# Patient Record
Sex: Female | Born: 1945 | Race: Black or African American | Hispanic: No | Smoking: Never smoker
Health system: Southern US, Community
[De-identification: ages and names within clinical notes are randomized; demographics above are authoritative.]

## PROBLEM LIST (undated history)

## (undated) DIAGNOSIS — I639 Cerebral infarction, unspecified: Secondary | ICD-10-CM

## (undated) DIAGNOSIS — J302 Other seasonal allergic rhinitis: Secondary | ICD-10-CM

## (undated) DIAGNOSIS — M109 Gout, unspecified: Secondary | ICD-10-CM

## (undated) HISTORY — PX: GANGLION CYST EXCISION: SHX1691

## (undated) HISTORY — PX: BACK SURGERY: SHX140

## (undated) HISTORY — PX: ABDOMINAL HYSTERECTOMY: SHX81

---

## 2016-02-11 ENCOUNTER — Emergency Department (HOSPITAL_BASED_OUTPATIENT_CLINIC_OR_DEPARTMENT_OTHER)
Admission: EM | Admit: 2016-02-11 | Discharge: 2016-02-11 | Disposition: A | Discharge status: Home / Self Care | Payer: Medicare HMO | Attending: Emergency Medicine | Admitting: Emergency Medicine

## 2016-02-11 ENCOUNTER — Encounter (HOSPITAL_BASED_OUTPATIENT_CLINIC_OR_DEPARTMENT_OTHER): Payer: Self-pay | Admitting: *Deleted

## 2016-02-11 DIAGNOSIS — R220 Localized swelling, mass and lump, head: Secondary | ICD-10-CM | POA: Diagnosis present

## 2016-02-11 DIAGNOSIS — H109 Unspecified conjunctivitis: Secondary | ICD-10-CM | POA: Diagnosis not present

## 2016-02-11 HISTORY — DX: Other seasonal allergic rhinitis: J30.2

## 2016-02-11 MED ORDER — TOBRAMYCIN 0.3 % OP SOLN: 2.0000 [drp] | OPHTHALMIC | Status: DC

## 2016-02-11 MED ORDER — TETRACAINE HCL 0.5 % OP SOLN
2.0000 [drp] | Freq: Once | OPHTHALMIC | Status: AC
Start: 2016-02-11 — End: 2016-02-11
  Administered 2016-02-11: 2 [drp] via OPHTHALMIC
  Filled 2016-02-11: qty 4

## 2016-02-11 MED ORDER — DEXAMETHASONE SODIUM PHOSPHATE 10 MG/ML IJ SOLN
10.0000 mg | Freq: Once | INTRAMUSCULAR | Status: AC
  Administered 2016-02-11: 10 mg via INTRAMUSCULAR
  Filled 2016-02-11: qty 1

## 2016-02-11 MED ORDER — FLUORESCEIN SODIUM 1 MG OP STRP
1.0000 | ORAL_STRIP | Freq: Once | OPHTHALMIC | Status: AC
  Administered 2016-02-11: 1 via OPHTHALMIC
  Filled 2016-02-11: qty 1

## 2016-02-11 NOTE — ED Notes (Signed)
Pt reports her usual allergy symptoms with facial tenderness and sinus pain and bilateral eye swelling. Pt states "I don't feel sick, I just need a shot to chase this swelling out of my eyes..."" pt has taken claritin and murine eye drops with no relief.

## 2016-02-11 NOTE — ED Provider Notes (Signed)
CSN: [id number]     Arrival date & time 02/11/16  0946 History   First MD Initiated Contact with Patient 02/11/16 1002     Chief Complaint  Patient presents with  . Facial Swelling     (Consider location/radiation/quality/duration/timing/severity/associated sxs/prior Treatment) HPI Comments: Patient presents with redness and swelling to her left eye. She states that this is been going on for the last few days and has been worsening since then. She has a history of seasonal allergies and states that in the past she's had similar symptoms of eye redness and swelling attributed to her allergies. She reports itchy, watery eyes with some occasional yellow discharge. She denies any pain to the eye. No vision changes. She's been using Claritin and her Murine eyedrops without relief. She states normally it improves with a steroid shot.   Past Medical History  Diagnosis Date  . Seasonal allergies    History reviewed. No pertinent past surgical history. History reviewed. No pertinent family history. Social History  Substance Use Topics  . Smoking status: Never Smoker   . Smokeless tobacco: None  . Alcohol Use: None   OB History    No data available     Review of Systems  Constitutional: Negative for fever, chills, diaphoresis and fatigue.  HENT: Positive for facial swelling. Negative for congestion, rhinorrhea and sneezing.   Eyes: Positive for discharge, redness and itching. Negative for photophobia, pain and visual disturbance.  Respiratory: Negative for cough, chest tightness and shortness of breath.   Cardiovascular: Negative for chest pain and leg swelling.  Gastrointestinal: Negative for nausea, vomiting, abdominal pain, diarrhea and blood in stool.  Genitourinary: Negative for frequency, hematuria, flank pain and difficulty urinating.  Musculoskeletal: Negative for back pain and arthralgias.  Skin: Negative for rash.  Neurological: Negative for dizziness, speech difficulty,  weakness, numbness and headaches.      Allergies  Review of patient's allergies indicates no known allergies.  Home Medications   Prior to Admission medications   Medication Sig Start Date End Date Taking? Authorizing Provider  tobramycin (TOBREX) 0.3 % ophthalmic solution Place 2 drops into the left eye every 4 (four) hours. 02/11/16   Rolan BuccoMelanie Alainna Stawicki, MD   BP 153/92 mmHg  Pulse 69  Temp(Src) 98.5 F (36.9 C) (Oral)  Resp 18  Ht 5\' 3"  (1.6 m)  Wt 207 lb (93.895 kg)  BMI 36.68 kg/m2  SpO2 98% Physical Exam  Constitutional: She is oriented to person, place, and time. She appears well-developed and well-nourished.  HENT:  Head: Normocephalic and atraumatic.  Eyes: Pupils are equal, round, and reactive to light.  Patient has mild swelling around the right eye. There is no redness. No pain with eye movements. No proptosis. She has erythema to the conjunctiva of the left eye with watery drainage.  Neck: Normal range of motion. Neck supple.  Cardiovascular: Normal rate, regular rhythm and normal heart sounds.   Pulmonary/Chest: Effort normal and breath sounds normal. No respiratory distress. She has no wheezes. She has no rales. She exhibits no tenderness.  Abdominal: Soft. Bowel sounds are normal. There is no tenderness. There is no rebound and no guarding.  Musculoskeletal: Normal range of motion. She exhibits no edema.  Lymphadenopathy:    She has no cervical adenopathy.  Neurological: She is alert and oriented to person, place, and time.  Skin: Skin is warm and dry. No rash noted.  Psychiatric: She has a normal mood and affect.    ED Course  Procedures (including critical  care time) Labs Review Labs Reviewed - No data to display  Imaging Review No results found. I have personally reviewed and evaluated these images and lab results as part of my medical decision-making.   EKG Interpretation None      MDM   Final diagnoses:  Conjunctivitis of left eye    Patient  with redness and drainage from the left eye. There is no suggestions of orbital or. Orbital cellulitis. Slit-lamp exam was performed. There is no staining areas. No dendrites. She was discharged home in good condition. She stresses that these are the same symptoms she's had before with her allergies and usually responds to steroids. She was given a shot of Decadron in the ED. I did go ahead and put her on Tobrex eyedrops as well. I encouraged her to follow-up with her eye doctor on Monday if her symptoms are not improved. She does have an appointment with her eye doctor on the 27th of this month, but I encouraged her to be seen on Monday if her symptoms are not improved. She was advised return here if she has worsening symptoms.    Rolan Bucco, MD 02/11/16 1106

## 2016-02-11 NOTE — ED Notes (Signed)
Supplies placed at bedside for md. 

## 2017-04-22 ENCOUNTER — Emergency Department (HOSPITAL_BASED_OUTPATIENT_CLINIC_OR_DEPARTMENT_OTHER)
Admission: EM | Admit: 2017-04-22 | Discharge: 2017-04-22 | Disposition: A | Discharge status: Home / Self Care | Payer: Medicare HMO | Attending: Emergency Medicine | Admitting: Emergency Medicine

## 2017-04-22 ENCOUNTER — Emergency Department (HOSPITAL_BASED_OUTPATIENT_CLINIC_OR_DEPARTMENT_OTHER): Payer: Medicare HMO

## 2017-04-22 ENCOUNTER — Encounter (HOSPITAL_BASED_OUTPATIENT_CLINIC_OR_DEPARTMENT_OTHER): Payer: Self-pay | Admitting: *Deleted

## 2017-04-22 DIAGNOSIS — Y929 Unspecified place or not applicable: Secondary | ICD-10-CM | POA: Diagnosis not present

## 2017-04-22 DIAGNOSIS — M79671 Pain in right foot: Secondary | ICD-10-CM | POA: Diagnosis present

## 2017-04-22 DIAGNOSIS — Y999 Unspecified external cause status: Secondary | ICD-10-CM | POA: Insufficient documentation

## 2017-04-22 DIAGNOSIS — Y9301 Activity, walking, marching and hiking: Secondary | ICD-10-CM | POA: Diagnosis not present

## 2017-04-22 DIAGNOSIS — S93491A Sprain of other ligament of right ankle, initial encounter: Secondary | ICD-10-CM

## 2017-04-22 DIAGNOSIS — X58XXXA Exposure to other specified factors, initial encounter: Secondary | ICD-10-CM | POA: Diagnosis not present

## 2017-04-22 HISTORY — DX: Gout, unspecified: M10.9

## 2017-04-22 MED ORDER — ACETAMINOPHEN 500 MG PO TABS
1000.0000 mg | ORAL_TABLET | Freq: Once | ORAL | Status: AC
  Administered 2017-04-22: 1000 mg via ORAL
  Filled 2017-04-22: qty 2

## 2017-04-22 NOTE — Discharge Instructions (Signed)
Take tylenol 1000mg(2 extra strength) four times a day.  ° °

## 2017-04-22 NOTE — ED Triage Notes (Signed)
Right foot pain and swelling x 2 days. No injury.

## 2017-04-22 NOTE — ED Provider Notes (Signed)
[facility]   CSN: [id number] Arrival date & time: 04/22/17  1628     History   Chief Complaint Chief Complaint  Patient presents with  . Foot Pain    HPI Carrie Kline is a 71 y.o. female.  71 yo F with a chief complaint of right ankle pain. Going on for the past 3 or 4 days. She has been walking on it more often. Denies trauma denies rolling it.  She denies fevers or chills. Denies knee pain. She has a history of gout but thinks this feels different. Describes it as a sharp and shooting pain. Has tried ice and elevation with some improvement.   The history is provided by the patient.  Foot Pain  This is a new problem. The current episode started more than 2 days ago. The problem occurs constantly. The problem has been gradually worsening. Pertinent negatives include no chest pain, no headaches and no shortness of breath. The symptoms are aggravated by walking, twisting and bending. Nothing relieves the symptoms. She has tried nothing for the symptoms. The treatment provided no relief.    Past Medical History:  Diagnosis Date  . Gout   . Seasonal allergies     There are no active problems to display for this patient.   Past Surgical History:  Procedure Laterality Date  . ABDOMINAL HYSTERECTOMY    . BACK SURGERY    . GANGLION CYST EXCISION      OB History    No data available       Home Medications    Prior to Admission medications   Medication Sig Start Date End Date Taking? Authorizing Provider  tobramycin (TOBREX) 0.3 % ophthalmic solution Place 2 drops into the left eye every 4 (four) hours. 02/11/16   Rolan Bucco, MD    Family History No family history on file.  Social History Social History  Substance Use Topics  . Smoking status: Never Smoker  . Smokeless tobacco: Never Used  . Alcohol use No     Allergies   Darvon [propoxyphene] and Hydrocortisone   Review of Systems Review of Systems  Constitutional:  Negative for chills and fever.  HENT: Negative for congestion and rhinorrhea.   Eyes: Negative for redness and visual disturbance.  Respiratory: Negative for shortness of breath and wheezing.   Cardiovascular: Negative for chest pain and palpitations.  Gastrointestinal: Negative for nausea and vomiting.  Genitourinary: Negative for dysuria and urgency.  Musculoskeletal: Positive for arthralgias and myalgias.  Skin: Negative for pallor and wound.  Neurological: Negative for dizziness and headaches.     Physical Exam Updated Vital Signs BP 129/77   Pulse 89   Temp 99 F (37.2 C) (Oral)   Resp 18   Ht 5\' 3"  (1.6 m)   Wt 89.4 kg (197 lb)   SpO2 100%   BMI 34.90 kg/m   Physical Exam  Constitutional: She is oriented to person, place, and time. She appears well-developed and well-nourished. No distress.  HENT:  Head: Normocephalic and atraumatic.  Eyes: Pupils are equal, round, and reactive to light. EOM are normal.  Neck: Normal range of motion. Neck supple.  Cardiovascular: Normal rate and regular rhythm.  Exam reveals no gallop and no friction rub.   No murmur heard. Pulmonary/Chest: Effort normal. She has no wheezes. She has no rales.  Abdominal: Soft. She exhibits no distension and no mass. There is no tenderness. There is no guarding.  Musculoskeletal: She exhibits edema and tenderness.  Tenderness and swelling to the right ankle. No bony tenderness. Able to range  without difficulty. Mildly warm compared to the other side. No overlying erythema. Pulse motor and sensation intact distally.  Neurological: She is alert and oriented to person, place, and time.  Skin: Skin is warm and dry. She is not diaphoretic.  Psychiatric: She has a normal mood and affect. Her behavior is normal.  Nursing note and vitals reviewed.    ED Treatments / Results  Labs (all labs ordered are listed, but only abnormal results are displayed) Labs Reviewed - No data to display  EKG  EKG  Interpretation None       Radiology Dg Foot Complete Right  Result Date: 04/22/2017 CLINICAL DATA:  RIGHT foot pain for 2 days medially, history gout EXAM: RIGHT FOOT COMPLETE - 3+ VIEW COMPARISON:  None FINDINGS: Diffuse soft tissue swelling. Osseous demineralization. Joint spaces preserved. Small plantar calcaneal spur. No acute fracture, subluxation, or bone destruction. IMPRESSION: Soft tissue swelling RIGHT foot without acute bony abnormalities. Electronically Signed   By: Ulyses Southward M.D.   On: 04/22/2017 17:16    Procedures Procedures (including critical care time)  Medications Ordered in ED Medications  acetaminophen (TYLENOL) tablet 1,000 mg (1,000 mg Oral Given 04/22/17 1805)     Initial Impression / Assessment and Plan / ED Course  I have reviewed the triage vital signs and the nursing notes.  Pertinent labs & imaging results that were available during my care of the patient were reviewed by me and considered in my medical decision making (see chart for details).     71 yo F With a chief complaint of right ankle pain. This been going on for about 3 or 4 days. I doubt this is septic arthritis without time course as well as clinically she is able to move it mildly it's not hot and she does not have a fever. I did however discuss an arthrocentesis was evaluated several this out for sure. At this point she will defer the procedure. We'll place in a splint. Make her nonweightbearing. PCP follow-up.  6:06 PM:  I have discussed the diagnosis/risks/treatment options with the patient and believe the pt to be eligible for discharge home to follow-up with PCP. We also discussed returning to the ED immediately if new or worsening sx occur. We discussed the sx which are most concerning (e.g., sudden worsening pain, fever, inability to tolerate by mouth) that necessitate immediate return. Medications administered to the patient during their visit and any new prescriptions provided to the  patient are listed below.  Medications given during this visit Medications  acetaminophen (TYLENOL) tablet 1,000 mg (1,000 mg Oral Given 04/22/17 1805)     The patient appears reasonably screen and/or stabilized for discharge and I doubt any other medical condition or other [facility] [place] requiring further screening, evaluation, or treatment in the ED at this time prior to discharge.    Final Clinical Impressions(s) / ED Diagnoses   Final diagnoses:  Sprain of anterior talofibular ligament of right ankle, initial encounter    New Prescriptions New Prescriptions   No medications on file     Melene Plan, DO 04/22/17 1806

## 2017-05-20 ENCOUNTER — Emergency Department (HOSPITAL_BASED_OUTPATIENT_CLINIC_OR_DEPARTMENT_OTHER)
Admission: EM | Admit: 2017-05-20 | Discharge: 2017-05-20 | Disposition: A | Discharge status: Home / Self Care | Payer: Medicare HMO | Attending: Emergency Medicine | Admitting: Emergency Medicine

## 2017-05-20 ENCOUNTER — Encounter (HOSPITAL_BASED_OUTPATIENT_CLINIC_OR_DEPARTMENT_OTHER): Payer: Self-pay | Admitting: *Deleted

## 2017-05-20 DIAGNOSIS — J069 Acute upper respiratory infection, unspecified: Secondary | ICD-10-CM | POA: Diagnosis not present

## 2017-05-20 DIAGNOSIS — R05 Cough: Secondary | ICD-10-CM | POA: Diagnosis present

## 2017-05-20 MED ORDER — DOXYCYCLINE HYCLATE 100 MG PO CAPS: 100.0000 mg | ORAL_CAPSULE | Freq: Two times a day (BID) | ORAL | 0 refills | Status: DC

## 2017-05-20 MED ORDER — PROMETHAZINE-DM 6.25-15 MG/5ML PO SYRP: 5.0000 mL | ORAL_SOLUTION | Freq: Four times a day (QID) | ORAL | 0 refills | Status: DC | PRN

## 2017-05-20 MED ORDER — GUAIFENESIN ER 1200 MG PO TB12: 1.0000 | ORAL_TABLET | Freq: Two times a day (BID) | ORAL | 0 refills | Status: DC

## 2017-05-20 MED ORDER — DEXAMETHASONE SODIUM PHOSPHATE 10 MG/ML IJ SOLN
8.0000 mg | Freq: Once | INTRAMUSCULAR | Status: AC
  Administered 2017-05-20: 8 mg via INTRAMUSCULAR
  Filled 2017-05-20: qty 1

## 2017-05-20 NOTE — Discharge Instructions (Signed)
Return here as needed. Follow up with your primary doctor. Increase your fluid intake. °

## 2017-05-20 NOTE — ED Triage Notes (Signed)
Cough, congestion and headache x 5 days. She has been taking OTC sinus medication.

## 2017-05-24 NOTE — ED Provider Notes (Signed)
[facility]-EMERGENCY DEPT Provider Note   CSN: [id number] Arrival date & time: 05/20/17  4098     History   Chief Complaint Chief Complaint  Patient presents with  . Cough    HPI Carrie Kline is a 71 y.o. female.  HPI Patient presents to the emergency department with cough, nasal congestion and a headache 5 days.  The patient states that she gets this type of illness every year and it is always a sinus infection.  She would like treatment for sinus infection.  She states that nothing seems make her condition better or worse.  She states she did take some over-the-counter medications he takes medications chronically for allergies.  She states that nothing seems make the condition better or worseThe patient denies chest pain, shortness of breath, headache,blurred vision, neck pain, fever,  weakness, numbness, dizziness, anorexia, edema, abdominal pain, nausea, vomiting, diarrhea, rash, back pain, dysuria, hematemesis, bloody stool, near syncope, or syncope. Past Medical History:  Diagnosis Date  . Gout   . Seasonal allergies     There are no active problems to display for this patient.   Past Surgical History:  Procedure Laterality Date  . ABDOMINAL HYSTERECTOMY    . BACK SURGERY    . GANGLION CYST EXCISION      OB History    No data available       Home Medications    Prior to Admission medications   Medication Sig Start Date End Date Taking? Authorizing Provider  doxycycline (VIBRAMYCIN) 100 MG capsule Take 1 capsule (100 mg total) by mouth 2 (two) times daily. 05/20/17   Audryna Wendt, Cristal Deer, PA-C  Guaifenesin 1200 MG TB12 Take 1 tablet (1,200 mg total) by mouth 2 (two) times daily. 05/20/17   Aland Chestnutt, Cristal Deer, PA-C  promethazine-dextromethorphan (PROMETHAZINE-DM) 6.25-15 MG/5ML syrup Take 5 mLs by mouth 4 (four) times daily as needed for cough. 05/20/17   Oniel Meleski, Cristal Deer, PA-C  tobramycin (TOBREX) 0.3 % ophthalmic solution Place 2 drops into the left eye every 4  (four) hours. 02/11/16   Rolan Bucco, MD    Family History No family history on file.  Social History Social History  Substance Use Topics  . Smoking status: Never Smoker  . Smokeless tobacco: Never Used  . Alcohol use No     Allergies   Darvon [propoxyphene] and Hydrocortisone   Review of Systems Review of Systems  All other systems negative except as documented in the HPI. All pertinent positives and negatives as reviewed in the HPI. Physical Exam Updated Vital Signs BP (!) 160/89 (BP Location: Left Arm)   Pulse 82   Temp 98.2 F (36.8 C) (Oral)   Resp 16   Ht  (1.6 m)   Wt 89.4 kg (197 lb)   SpO2 100%   BMI 34.90 kg/m   Physical Exam  Constitutional: She is oriented to person, place, and time. She appears well-developed and well-nourished. No distress.  HENT:  Head: Normocephalic and atraumatic.  Mouth/Throat: Oropharynx is clear and moist.  Eyes: Pupils are equal, round, and reactive to light.  Neck: Normal range of motion. Neck supple.  Cardiovascular: Normal rate, regular rhythm and normal heart sounds.  Exam reveals no gallop and no friction rub.   No murmur heard. Pulmonary/Chest: Effort normal and breath sounds normal. No respiratory distress. She has no wheezes.  Neurological: She is alert and oriented to person, place, and time. She exhibits normal muscle tone. Coordination normal.  Skin: Skin is warm and dry. Capillary refill takes less  than 2 seconds. No rash noted. No erythema.  Psychiatric: She has a normal mood and affect. Her behavior is normal.  Nursing note and vitals reviewed.    ED Treatments / Results  Labs (all labs ordered are listed, but only abnormal results are displayed) Labs Reviewed - No data to display  EKG  EKG Interpretation None       Radiology No results found.  Procedures Procedures (including critical care time)  Medications Ordered in ED Medications  dexamethasone (DECADRON) injection 8 mg (8 mg  Intramuscular Given 05/20/17 2138)     Initial Impression / Assessment and Plan / ED Course  I have reviewed the triage vital signs and the nursing notes.  Pertinent labs & imaging results that were available during my care of the patient were reviewed by me and considered in my medical decision making (see chart for details).     She will be treated for a sinusitis.  Told to return here as needed.  Patient agrees to plan and all questions were answered  Final Clinical Impressions(s) / ED Diagnoses   Final diagnoses:  Acute upper respiratory infection    New Prescriptions Discharge Medication List as of 05/20/2017  9:23 PM    START taking these medications   Details  doxycycline (VIBRAMYCIN) 100 MG capsule Take 1 capsule (100 mg total) by mouth 2 (two) times daily., Starting Mon 05/20/2017, Print    Guaifenesin 1200 MG TB12 Take 1 tablet (1,200 mg total) by mouth 2 (two) times daily., Starting Mon 05/20/2017, Print    promethazine-dextromethorphan (PROMETHAZINE-DM) 6.25-15 MG/5ML syrup Take 5 mLs by mouth 4 (four) times daily as needed for cough., Starting Mon 05/20/2017, Print         Elia Nunley, [place], PA-C 05/24/17 0981    Vanetta Mulders, MD 06/01/17 [phone]

## 2017-10-28 ENCOUNTER — Emergency Department (HOSPITAL_BASED_OUTPATIENT_CLINIC_OR_DEPARTMENT_OTHER)
Admission: EM | Admit: 2017-10-28 | Discharge: 2017-10-28 | Disposition: A | Discharge status: Home / Self Care | Payer: Medicare HMO | Attending: Emergency Medicine | Admitting: Emergency Medicine

## 2017-10-28 ENCOUNTER — Other Ambulatory Visit: Payer: Self-pay

## 2017-10-28 ENCOUNTER — Encounter (HOSPITAL_BASED_OUTPATIENT_CLINIC_OR_DEPARTMENT_OTHER): Payer: Self-pay | Admitting: Emergency Medicine

## 2017-10-28 DIAGNOSIS — R070 Pain in throat: Secondary | ICD-10-CM | POA: Diagnosis present

## 2017-10-28 DIAGNOSIS — J020 Streptococcal pharyngitis: Secondary | ICD-10-CM | POA: Diagnosis not present

## 2017-10-28 DIAGNOSIS — Z79899 Other long term (current) drug therapy: Secondary | ICD-10-CM | POA: Insufficient documentation

## 2017-10-28 DIAGNOSIS — J069 Acute upper respiratory infection, unspecified: Secondary | ICD-10-CM

## 2017-10-28 LAB — RAPID STREP SCREEN (MED CTR MEBANE ONLY): Streptococcus, Group A Screen (Direct): POSITIVE — AB

## 2017-10-28 MED ORDER — ACETAMINOPHEN 325 MG PO TABS
650.0000 mg | ORAL_TABLET | Freq: Once | ORAL | Status: AC | PRN
  Administered 2017-10-28: 650 mg via ORAL
  Filled 2017-10-28: qty 2

## 2017-10-28 MED ORDER — BENZONATATE 100 MG PO CAPS: 100.0000 mg | ORAL_CAPSULE | Freq: Three times a day (TID) | ORAL | 0 refills | Status: DC

## 2017-10-28 MED ORDER — PENICILLIN G BENZATHINE 1200000 UNIT/2ML IM SUSP
1.2000 10*6.[IU] | Freq: Once | INTRAMUSCULAR | Status: AC
  Administered 2017-10-28: 1.2 10*6.[IU] via INTRAMUSCULAR
  Filled 2017-10-28: qty 2

## 2017-10-28 NOTE — ED Provider Notes (Signed)
[facility] EMERGENCY DEPARTMENT Provider Note   CSN: [id number] Arrival date & time: 10/28/17  1253     History   Chief Complaint Chief Complaint  Patient presents with  . Sore Throat    HPI Carrie Kline is a 72 y.o. female.  Patient with URI symptoms, fever, headache x 2 days. Works at a daycare.    The history is provided by the patient. No language interpreter was used.  Sore Throat  This is a new problem. The current episode started yesterday. The problem occurs constantly. The problem has been gradually worsening. Associated symptoms include headaches. Pertinent negatives include no abdominal pain. She has tried nothing for the symptoms.    Past Medical History:  Diagnosis Date  . Gout   . Seasonal allergies     There are no active problems to display for this patient.   Past Surgical History:  Procedure Laterality Date  . ABDOMINAL HYSTERECTOMY    . BACK SURGERY    . GANGLION CYST EXCISION      OB History    No data available       Home Medications    Prior to Admission medications   Medication Sig Start Date End Date Taking? Authorizing Provider  doxycycline (VIBRAMYCIN) 100 MG capsule Take 1 capsule (100 mg total) by mouth 2 (two) times daily. 05/20/17   Lawyer, Cristal Deer, PA-C  Guaifenesin 1200 MG TB12 Take 1 tablet (1,200 mg total) by mouth 2 (two) times daily. 05/20/17   Lawyer, Cristal Deer, PA-C  promethazine-dextromethorphan (PROMETHAZINE-DM) 6.25-15 MG/5ML syrup Take 5 mLs by mouth 4 (four) times daily as needed for cough. 05/20/17   Lawyer, Cristal Deer, PA-C  tobramycin (TOBREX) 0.3 % ophthalmic solution Place 2 drops into the left eye every 4 (four) hours. 02/11/16   Rolan Bucco, MD    Family History History reviewed. No pertinent family history.  Social History Social History   Tobacco Use  . Smoking status: Never Smoker  . Smokeless tobacco: Never Used  Substance Use Topics  . Alcohol use: No  . Drug use: No      Allergies   Darvon [propoxyphene] and Hydrocortisone   Review of Systems Review of Systems  Constitutional: Positive for fever.  HENT: Positive for postnasal drip and sinus pressure. Negative for trouble swallowing.   Respiratory: Positive for cough.   Gastrointestinal: Negative for abdominal pain.  Neurological: Positive for headaches.  All other systems reviewed and are negative.    Physical Exam Updated Vital Signs BP (!) 148/121 (BP Location: Left Arm)   Pulse (!) 102   Temp (!) 100.4 F (38 C) (Oral)   Resp 20   Ht 5\' 3"  (1.6 m)   Wt 91.6 kg (202 lb)   SpO2 98%   BMI 35.78 kg/m   Physical Exam  Constitutional: She is oriented to person, place, and time. She appears well-developed and well-nourished.  HENT:  Right Ear: Tympanic membrane normal.  Left Ear: Tympanic membrane normal.  Mouth/Throat: Mucous membranes are normal. Posterior oropharyngeal erythema present. No oropharyngeal exudate.  Neck: Neck supple.  Cardiovascular: Normal rate and regular rhythm.  Pulmonary/Chest: Effort normal.  Abdominal: Soft.  Lymphadenopathy:    She has no cervical adenopathy.  Neurological: She is alert and oriented to person, place, and time.  Skin: Skin is warm and dry. No rash noted.  Psychiatric: She has a normal mood and affect.  Nursing note and vitals reviewed.    ED Treatments / Results  Labs (all labs ordered are  listed, but only abnormal results are displayed) Labs Reviewed  RAPID STREP SCREEN (NOT AT [facility]) - Abnormal; Notable for the following components:      Result Value   Streptococcus, Group A Screen (Direct) POSITIVE (*)    All other components within normal limits    EKG  EKG Interpretation None       Radiology No results found.  Procedures Procedures (including critical care time)  Medications Ordered in ED Medications  acetaminophen (TYLENOL) tablet 650 mg (650 mg Oral Given 10/28/17 1316)  penicillin g benzathine (BICILLIN LA)  1200000 UNIT/2ML injection 1.2 Million Units (1.2 Million Units Intramuscular Given 10/28/17 1452)     Initial Impression / Assessment and Plan / ED Course  I have reviewed the triage vital signs and the nursing notes.  Pertinent labs & imaging results that were available during my care of the patient were reviewed by me and considered in my medical decision making (see chart for details).    Patient discussed with Dr. Jacqulyn BathLong prior to discharge. Agrees with treatment plan.  Pt rapid strep test positive. Pt is tolerating secretions. Presentation not concerning for peritonsillar abscess or spread of infection to deep spaces of the throat; patent airway. Pt given bicillin IM in ED.  Specific return precautions discussed. Recommended PCP follow up. Pt appears safe for discharge.       Final Clinical Impressions(s) / ED Diagnoses   Final diagnoses:  Upper respiratory tract infection, unspecified type  Strep pharyngitis    ED Discharge Orders        Ordered    benzonatate (TESSALON) 100 MG capsule  Every 8 hours     10/28/17 1448       Felicie MornSmith, Markee Matera, NP 10/28/17 1631    Long, Arlyss RepressJoshua G, MD 10/28/17 2001

## 2017-10-28 NOTE — ED Triage Notes (Signed)
Patient reports sore throat and headache since yesterday afternoon, denies fevers.

## 2017-10-31 ENCOUNTER — Emergency Department (HOSPITAL_BASED_OUTPATIENT_CLINIC_OR_DEPARTMENT_OTHER): Payer: Medicare HMO

## 2017-10-31 ENCOUNTER — Emergency Department (HOSPITAL_BASED_OUTPATIENT_CLINIC_OR_DEPARTMENT_OTHER)
Admission: EM | Admit: 2017-10-31 | Discharge: 2017-10-31 | Disposition: A | Discharge status: Home / Self Care | Payer: Medicare HMO | Attending: Emergency Medicine | Admitting: Emergency Medicine

## 2017-10-31 DIAGNOSIS — Z79899 Other long term (current) drug therapy: Secondary | ICD-10-CM | POA: Diagnosis not present

## 2017-10-31 DIAGNOSIS — J111 Influenza due to unidentified influenza virus with other respiratory manifestations: Secondary | ICD-10-CM | POA: Diagnosis not present

## 2017-10-31 DIAGNOSIS — R6889 Other general symptoms and signs: Secondary | ICD-10-CM

## 2017-10-31 MED ORDER — BENZONATATE 100 MG PO CAPS: 100.0000 mg | ORAL_CAPSULE | Freq: Three times a day (TID) | ORAL | 0 refills | Status: DC | PRN

## 2017-10-31 MED ORDER — IBUPROFEN 200 MG PO TABS
600.0000 mg | ORAL_TABLET | Freq: Once | ORAL | Status: AC
  Administered 2017-10-31: 600 mg via ORAL
  Filled 2017-10-31: qty 1

## 2017-10-31 MED ORDER — ACETAMINOPHEN 500 MG PO TABS
1000.0000 mg | ORAL_TABLET | Freq: Once | ORAL | Status: AC
  Administered 2017-10-31: 1000 mg via ORAL
  Filled 2017-10-31: qty 2

## 2017-10-31 MED ORDER — GUAIFENESIN 100 MG/5ML PO SOLN
10.0000 mL | Freq: Once | ORAL | Status: AC
  Administered 2017-10-31: 200 mg via ORAL
  Filled 2017-10-31: qty 10

## 2017-10-31 MED ORDER — ONDANSETRON 4 MG PO TBDP: 4.0000 mg | ORAL_TABLET | Freq: Four times a day (QID) | ORAL | 0 refills | Status: DC | PRN

## 2017-10-31 MED ORDER — ONDANSETRON 4 MG PO TBDP
4.0000 mg | ORAL_TABLET | Freq: Once | ORAL | Status: AC
  Administered 2017-10-31: 4 mg via ORAL
  Filled 2017-10-31: qty 1

## 2017-10-31 NOTE — Discharge Instructions (Signed)
You may alternate Tylenol 1000 mg every 6 hours as needed for fever and pain. Please rest and drink plenty of fluids. This is a viral illness causing your symptoms. You do not need antibiotics for a virus. You may use over-the-counter nasal saline spray and Afrin nasal saline spray as needed for nasal congestion. Please do not use Afrin for more than 3 days in a row. You may use Mucinex (guaifenesin) as needed for cough.  You may use lozenges and Chloraseptic spray to help with sore throat.  Warm salt water gargles can also help with sore throat.  Please note that some combination medicines such as DayQuil and NyQuil have multiple medications in them.  Please make sure you look at all labels to ensure that you are not taking too much of any one particular medication.  Symptoms from a virus may take 7-14 days to run its course.  We do not test for the flu from the emergency department as we do not have rapid flu swabs and it takes hours for this test to come back and it would not change our management. The flu is treated like any other virus with supportive measures as listed above. At this time you are outside the treatment window for Tamiflu. Tamiflu has to be taken within the first 48 hours of symptoms.  Tamiflu has many side effects including nausea, vomiting and diarrhea.

## 2017-10-31 NOTE — ED Provider Notes (Signed)
TIME SEEN: 5:23 AM  CHIEF COMPLAINT: Flulike symptoms  HPI: Patient is a 72 year old female with history of allergies, gout who presents to the emergency department with several days of flulike symptoms.  Was seen here on March 4 for the same and had a positive strep test and was given IM penicillin.  Reports she has continued to have subjective fevers, chills, body aches, dry cough, chest discomfort feeling like "something is rolling over my chest" with coughing, now vomiting and diarrhea.  No abdominal pain.  Did not have a flu shot this year.  No known sick contacts.  ROS: See HPI Constitutional:  fever  Eyes: no drainage  ENT:  runny nose   Cardiovascular:   chest pain  Resp: no SOB  GI: diarrhea and vomiting GU: no dysuria Integumentary: no rash  Allergy: no hives  Musculoskeletal: no leg swelling  Neurological: no slurred speech ROS otherwise negative  PAST MEDICAL HISTORY/PAST SURGICAL HISTORY:  Past Medical History:  Diagnosis Date  . Gout   . Seasonal allergies     MEDICATIONS:  Prior to Admission medications   Medication Sig Start Date End Date Taking? Authorizing Provider  benzonatate (TESSALON) 100 MG capsule Take 1 capsule (100 mg total) by mouth every 8 (eight) hours. 10/28/17   Felicie Morn, NP  doxycycline (VIBRAMYCIN) 100 MG capsule Take 1 capsule (100 mg total) by mouth 2 (two) times daily. 05/20/17   Lawyer, Cristal Deer, PA-C  Guaifenesin 1200 MG TB12 Take 1 tablet (1,200 mg total) by mouth 2 (two) times daily. 05/20/17   Lawyer, Cristal Deer, PA-C  promethazine-dextromethorphan (PROMETHAZINE-DM) 6.25-15 MG/5ML syrup Take 5 mLs by mouth 4 (four) times daily as needed for cough. 05/20/17   Lawyer, Cristal Deer, PA-C  tobramycin (TOBREX) 0.3 % ophthalmic solution Place 2 drops into the left eye every 4 (four) hours. 02/11/16   Rolan Bucco, MD    ALLERGIES:  Allergies  Allergen Reactions  . Darvon [Propoxyphene]     itching  . Hydrocortisone     itching     SOCIAL HISTORY:  Social History   Tobacco Use  . Smoking status: Never Smoker  . Smokeless tobacco: Never Used  Substance Use Topics  . Alcohol use: No    FAMILY HISTORY: No family history on file.  EXAM: BP (!) 153/90 (BP Location: Right Arm)   Pulse 100   Temp 98.6 F (37 C) (Oral)   Resp 14   SpO2 98%  CONSTITUTIONAL: Alert and oriented and responds appropriately to questions.  Nontoxic appearing.  Appears younger than stated age.  In no distress. HEAD: Normocephalic EYES: Conjunctivae clear, pupils appear equal, EOMI ENT: normal nose; moist mucous membranes; No pharyngeal erythema or petechiae, no tonsillar hypertrophy or exudate, no uvular deviation, no unilateral swelling, no trismus or drooling, no muffled voice, normal phonation, no stridor, no dental caries present, no drainable dental abscess noted, no Ludwig's angina, tongue sits flat in the bottom of the mouth, no angioedema, no facial erythema or warmth, no facial swelling; no pain with movement of the neck.  TMs are clear bilaterally without erythema, purulence, bulging, perforation, effusion.  No cerumen impaction or sign of foreign body in the external auditory canal. No inflammation, erythema or drainage from the external auditory canal. No signs of mastoiditis. No pain with manipulation of the pinna bilaterally. NECK: Supple, no meningismus, no nuchal rigidity, no LAD  CARD: RRR; S1 and S2 appreciated; no murmurs, no clicks, no rubs, no gallops RESP: Normal chest excursion without splinting or tachypnea;  breath sounds clear and equal bilaterally; no wheezes, no rhonchi, no rales, no hypoxia or respiratory distress, speaking full sentences ABD/GI: Normal bowel sounds; non-distended; soft, non-tender, no rebound, no guarding, no peritoneal signs, no hepatosplenomegaly BACK:  The back appears normal and is non-tender to palpation, there is no CVA tenderness EXT: Normal ROM in all joints; non-tender to palpation; no  edema; normal capillary refill; no cyanosis, no calf tenderness or swelling    SKIN: Normal color for age and race; warm; no rash NEURO: Moves all extremities equally PSYCH: The patient's mood and manner are appropriate. Grooming and personal hygiene are appropriate.  MEDICAL DECISION MAKING: Patient here with flulike symptoms.  Outside treatment window for Tamiflu.  Does complain of chest discomfort but mostly with coughing.  Doubt ACS, PE or dissection.  EKG shows shows no abnormality, signs of ischemia.  Will obtain chest x-ray although I suspect pneumonia is less likely.  She has clear lungs and no respiratory distress or hypoxia.  No sign of pharyngitis currently, deep space neck infection, PTA, meningitis.  She does not appear septic.  Complains of vomiting and diarrhea but abdominal exam is benign.  We will treat symptomatically with Tylenol, guaifenesin, Zofran.  ED PROGRESS: Chest x-ray shows signs suggestive of bronchiolitis but no infiltrate or edema.  Patient reports feeling better.  Have recommended over-the-counter Mucinex for cough.  Recommended Tylenol for fever and pain.  Will discharge with prescription of Zofran as well.  Discussed importance of rest and increase fluid intake.  Discussed return precautions.   At this time, I do not feel there is any life-threatening condition present. I have reviewed and discussed all results (EKG, imaging, lab, urine as appropriate) and exam findings with patient/family. I have reviewed nursing notes and appropriate previous records.  I feel the patient is safe to be discharged home without further emergent workup and can continue workup as an outpatient as needed. Discussed usual and customary return precautions. Patient/family verbalize understanding and are comfortable with this plan.  Outpatient follow-up has been provided if needed. All questions have been answered.    EKG Interpretation  Date/Time:  Thursday October 31 2017 05:30:36  EST Ventricular Rate:  99 PR Interval:    QRS Duration: 82 QT Interval:  350 QTC Calculation: 450 R Axis:   52 Text Interpretation:  Sinus rhythm No old tracing to compare Confirmed by Kalid Ghan, Baxter HireKristen [phone] on 10/31/2017 6:16:57 AM         Carsen Leaf, Layla MawKristen N, DO 10/31/17 [id number]

## 2020-03-28 ENCOUNTER — Encounter (HOSPITAL_BASED_OUTPATIENT_CLINIC_OR_DEPARTMENT_OTHER): Payer: Self-pay | Admitting: *Deleted

## 2020-03-28 ENCOUNTER — Other Ambulatory Visit: Payer: Self-pay

## 2020-03-28 ENCOUNTER — Emergency Department (HOSPITAL_BASED_OUTPATIENT_CLINIC_OR_DEPARTMENT_OTHER)
Admission: EM | Admit: 2020-03-28 | Discharge: 2020-03-28 | Disposition: A | Discharge status: Home / Self Care | Payer: Medicare Other | Attending: Emergency Medicine | Admitting: Emergency Medicine

## 2020-03-28 DIAGNOSIS — J0100 Acute maxillary sinusitis, unspecified: Secondary | ICD-10-CM | POA: Diagnosis not present

## 2020-03-28 DIAGNOSIS — J3489 Other specified disorders of nose and nasal sinuses: Secondary | ICD-10-CM | POA: Diagnosis present

## 2020-03-28 MED ORDER — AMOXICILLIN-POT CLAVULANATE 875-125 MG PO TABS: 1.0000 | ORAL_TABLET | Freq: Two times a day (BID) | ORAL | 0 refills | Status: DC

## 2020-03-28 MED ORDER — AMOXICILLIN-POT CLAVULANATE 875-125 MG PO TABS
1.0000 | ORAL_TABLET | Freq: Once | ORAL | Status: AC
Start: 2020-03-28 — End: 2020-03-28
  Administered 2020-03-28: 1 via ORAL
  Filled 2020-03-28: qty 1

## 2020-03-28 NOTE — ED Triage Notes (Signed)
Pt c/o sinus pressure x 1 week  ?

## 2020-03-28 NOTE — Discharge Instructions (Signed)
Take the Augmentin as directed.  Expect improvement over the next 1 to 2 days.  Return if not improved or for new or worse symptoms.

## 2020-04-27 NOTE — ED Provider Notes (Signed)
[facility] EMERGENCY DEPARTMENT Provider Note   CSN: [id number] Arrival date & time: 03/28/20  1721     History Chief Complaint  Patient presents with  . Facial Pain    Carrie Kline is a 74 y.o. female.  Patient brought in by POV the complaint of sinus pressure for a week.  States that he usually needs Augmentin and then gets better.  Denies any fevers.  States he does have some sinus pressure mostly in the maxillary sinus area.        Past Medical History:  Diagnosis Date  . Gout   . Seasonal allergies     There are no problems to display for this patient.   Past Surgical History:  Procedure Laterality Date  . ABDOMINAL HYSTERECTOMY    . BACK SURGERY    . GANGLION CYST EXCISION       OB History   No obstetric history on file.     History reviewed. No pertinent family history.  Social History   Tobacco Use  . Smoking status: Never Smoker  . Smokeless tobacco: Never Used  Substance Use Topics  . Alcohol use: No  . Drug use: No    Home Medications Prior to Admission medications   Medication Sig Start Date End Date Taking? Authorizing Provider  amoxicillin-clavulanate (AUGMENTIN) 875-125 MG tablet Take 1 tablet by mouth every 12 (twelve) hours. 03/28/20   Vanetta Mulders, MD  benzonatate (TESSALON) 100 MG capsule Take 1 capsule (100 mg total) by mouth 3 (three) times daily as needed for cough. 10/31/17   Ward, Layla Maw, DO  ondansetron (ZOFRAN ODT) 4 MG disintegrating tablet Take 1 tablet (4 mg total) by mouth every 6 (six) hours as needed for nausea or vomiting. 10/31/17   Ward, Layla Maw, DO    Allergies    Darvon [propoxyphene] and Hydrocortisone  Review of Systems   Review of Systems  Constitutional: Negative for chills and fever.  HENT: Positive for sinus pressure and sinus pain. Negative for congestion, rhinorrhea and sore throat.   Eyes: Negative for visual disturbance.  Respiratory: Negative for cough and shortness of breath.     Cardiovascular: Negative for chest pain and leg swelling.  Gastrointestinal: Negative for abdominal pain, diarrhea, nausea and vomiting.  Genitourinary: Negative for dysuria.  Musculoskeletal: Negative for back pain and neck pain.  Skin: Negative for rash.  Neurological: Negative for dizziness, light-headedness and headaches.  Hematological: Does not bruise/bleed easily.  Psychiatric/Behavioral: Negative for confusion.    Physical Exam Updated Vital Signs BP (!) 163/95 (BP Location: Right Arm)   Pulse 79   Temp 98.7 F (37.1 C) (Oral)   Resp 16   Ht 1.6 m (5\' 3" )   Wt 91.6 kg   SpO2 97%   BMI 35.78 kg/m   Physical Exam Vitals and nursing note reviewed.  Constitutional:      General: She is not in acute distress.    Appearance: Normal appearance. She is well-developed.  HENT:     Head: Normocephalic and atraumatic.     Mouth/Throat:     Mouth: Mucous membranes are moist.  Eyes:     Extraocular Movements: Extraocular movements intact.     Conjunctiva/sclera: Conjunctivae normal.     Pupils: Pupils are equal, round, and reactive to light.  Cardiovascular:     Rate and Rhythm: Normal rate and regular rhythm.     Heart sounds: No murmur heard.   Pulmonary:     Effort: Pulmonary effort is normal.  No respiratory distress.     Breath sounds: Normal breath sounds.  Abdominal:     Palpations: Abdomen is soft.     Tenderness: There is no abdominal tenderness.  Musculoskeletal:        General: Normal range of motion.     Cervical back: Normal range of motion and neck supple.  Skin:    General: Skin is warm and dry.  Neurological:     General: No focal deficit present.     Mental Status: She is alert and oriented to person, place, and time.     Cranial Nerves: No cranial nerve deficit.     Sensory: No sensory deficit.     Motor: No weakness.     ED Results / Procedures / Treatments   Labs (all labs ordered are listed, but only abnormal results are displayed) Labs  Reviewed - No data to display  EKG None  Radiology No results found.   Results for orders placed or performed during the hospital encounter of 10/28/17  Rapid strep screen   Specimen: Oral Mucosa/Gingiva; Other  Result Value Ref Range   Streptococcus, Group A Screen (Direct) POSITIVE (A) NEGATIVE   No results found.   Procedures Procedures (including critical care time)  Medications Ordered in ED Medications  amoxicillin-clavulanate (AUGMENTIN) 875-125 MG per tablet 1 tablet (1 tablet Oral Given 03/28/20 2150)    ED Course  I have reviewed the triage vital signs and the nursing notes.  Pertinent labs & imaging results that were available during my care of the patient were reviewed by me and considered in my medical decision making (see chart for details).    MDM Rules/Calculators/A&P                           Patient will be treated with Augmentin.  First dose given here.  I have a course to take at home.  Return for any new or worse symptoms.  Patient nontoxic no acute distress.  Final Clinical Impression(s) / ED Diagnoses Final diagnoses:  Acute maxillary sinusitis, recurrence not specified    Rx / DC Orders ED Discharge Orders         Ordered    amoxicillin-clavulanate (AUGMENTIN) 875-125 MG tablet  Every 12 hours        03/28/20 2119           Vanetta Mulders, MD 04/27/20 1950

## 2020-12-19 ENCOUNTER — Emergency Department (HOSPITAL_BASED_OUTPATIENT_CLINIC_OR_DEPARTMENT_OTHER)
Admission: EM | Admit: 2020-12-19 | Discharge: 2020-12-19 | Disposition: A | Discharge status: Home / Self Care | Payer: Medicare Other | Attending: Emergency Medicine | Admitting: Emergency Medicine

## 2020-12-19 ENCOUNTER — Other Ambulatory Visit: Payer: Self-pay

## 2020-12-19 ENCOUNTER — Encounter (HOSPITAL_BASED_OUTPATIENT_CLINIC_OR_DEPARTMENT_OTHER): Payer: Self-pay

## 2020-12-19 ENCOUNTER — Emergency Department (HOSPITAL_BASED_OUTPATIENT_CLINIC_OR_DEPARTMENT_OTHER): Payer: Medicare Other

## 2020-12-19 DIAGNOSIS — Y9389 Activity, other specified: Secondary | ICD-10-CM | POA: Insufficient documentation

## 2020-12-19 DIAGNOSIS — W1830XA Fall on same level, unspecified, initial encounter: Secondary | ICD-10-CM | POA: Diagnosis not present

## 2020-12-19 DIAGNOSIS — M533 Sacrococcygeal disorders, not elsewhere classified: Secondary | ICD-10-CM | POA: Insufficient documentation

## 2020-12-19 DIAGNOSIS — M25562 Pain in left knee: Secondary | ICD-10-CM | POA: Insufficient documentation

## 2020-12-19 DIAGNOSIS — M79652 Pain in left thigh: Secondary | ICD-10-CM | POA: Diagnosis not present

## 2020-12-19 DIAGNOSIS — W19XXXA Unspecified fall, initial encounter: Secondary | ICD-10-CM

## 2020-12-19 DIAGNOSIS — M25552 Pain in left hip: Secondary | ICD-10-CM | POA: Diagnosis present

## 2020-12-19 MED ORDER — ACETAMINOPHEN 325 MG PO TABS
650.0000 mg | ORAL_TABLET | Freq: Once | ORAL | Status: AC
  Administered 2020-12-19: 650 mg via ORAL
  Filled 2020-12-19: qty 2

## 2020-12-19 NOTE — Discharge Instructions (Addendum)
You have been seen and discharged from the emergency department.  Your x-ray imaging was negative for fracture.  Take Tylenol/ibuprofen as needed for pain control.  Follow-up with your primary provider for reevaluation and further care. Take home medications as prescribed. If you have any worsening symptoms, swelling of the lower extremity, inability to walk or further concerns for your health please return to an emergency department for further evaluation.

## 2020-12-19 NOTE — ED Provider Notes (Signed)
[facility] EMERGENCY DEPARTMENT Provider Note   CSN: [id number] Arrival date & time: 12/19/20  1726     History Chief Complaint  Patient presents with  . Fall    Carrie Kline is a 75 y.o. female.  HPI   75 year old female presents the emergency department after mechanical fall with left hip and left knee pain.  Patient states 2 days ago when she was taking out the trash she fell down onto her left hip/buttocks.  Did not hit her head, no loss of consciousness, no syncope.  She has been ambulatory however the pain in her left posterior buttocks has continued and so she came in for evaluation.  She is only taken 1 dose of Tylenol in the past 2 days.  Denies any numbness or tingling of the lower extremity, no significant swelling.  Past Medical History:  Diagnosis Date  . Gout   . Seasonal allergies     There are no problems to display for this patient.   Past Surgical History:  Procedure Laterality Date  . ABDOMINAL HYSTERECTOMY    . BACK SURGERY    . GANGLION CYST EXCISION       OB History   No obstetric history on file.     History reviewed. No pertinent family history.  Social History   Tobacco Use  . Smoking status: Never Smoker  . Smokeless tobacco: Never Used  Substance Use Topics  . Alcohol use: No  . Drug use: No    Home Medications Prior to Admission medications   Medication Sig Start Date End Date Taking? Authorizing Provider  amoxicillin-clavulanate (AUGMENTIN) 875-125 MG tablet Take 1 tablet by mouth every 12 (twelve) hours. 03/28/20   Vanetta Mulders, MD  benzonatate (TESSALON) 100 MG capsule Take 1 capsule (100 mg total) by mouth 3 (three) times daily as needed for cough. 10/31/17   Ward, Layla Maw, DO  ondansetron (ZOFRAN ODT) 4 MG disintegrating tablet Take 1 tablet (4 mg total) by mouth every 6 (six) hours as needed for nausea or vomiting. 10/31/17   Ward, Layla Maw, DO    Allergies    Darvon [propoxyphene] and Hydrocortisone  Review  of Systems   Review of Systems  Constitutional: Negative for chills and fever.  Respiratory: Negative for shortness of breath.   Cardiovascular: Negative for chest pain.  Gastrointestinal: Negative for abdominal pain, diarrhea and vomiting.  Genitourinary: Negative for dysuria.  Musculoskeletal:       + Left hip/buttock/thigh pain  Skin: Negative for rash.  Neurological: Negative for weakness, numbness and headaches.    Physical Exam Updated Vital Signs BP (!) 158/114   Pulse 84   Temp 98.4 F (36.9 C) (Oral)   Resp 20   Ht 5\' 3"  (1.6 m)   Wt 95.3 kg   SpO2 99%   BMI 37.20 kg/m   Physical Exam Vitals and nursing note reviewed.  Constitutional:      Appearance: Normal appearance.  HENT:     Head: Normocephalic.     Mouth/Throat:     Mouth: Mucous membranes are moist.  Cardiovascular:     Rate and Rhythm: Normal rate.  Pulmonary:     Effort: Pulmonary effort is normal. No respiratory distress.  Musculoskeletal:        General: No swelling or deformity.     Comments: Slight tenderness to palpation in the left lateral buttocks and thigh, no significant hematoma, no overlying skin changes, no deformity of the lower extremity, otherwise neurovascularly intact  Skin:    General: Skin is warm.  Neurological:     Mental Status: She is alert and oriented to person, place, and time. Mental status is at baseline.  Psychiatric:        Mood and Affect: Mood normal.     ED Results / Procedures / Treatments   Labs (all labs ordered are listed, but only abnormal results are displayed) Labs Reviewed - No data to display  EKG None  Radiology DG Knee Complete 4 Views Left  Result Date: 12/19/2020 CLINICAL DATA:  Pain after fall EXAM: LEFT KNEE - COMPLETE 4+ VIEW COMPARISON:  None. FINDINGS: No evidence of fracture, dislocation, or joint effusion. 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 Scattered  vascular calcifications. IMPRESSION: No acute abnormality. 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 Electronically Signed   By: Maudry Mayhew MD   On: 12/19/2020 19:46   DG Hip Unilat With Pelvis 2-3 Views Left  Result Date: 12/19/2020 CLINICAL DATA:  Hip pain after fall EXAM: DG HIP (WITH OR WITHOUT PELVIS) 2-3V LEFT COMPARISON:  None. FINDINGS: There is no evidence of hip fracture or dislocation. There is no evidence of significant arthropathy or other focal bone abnormality. IMPRESSION: Negative. Electronically Signed   By: Maudry Mayhew MD   On: 12/19/2020 19:45    Procedures Procedures   Medications Ordered in ED Medications  acetaminophen (TYLENOL) tablet 650 mg (650 mg Oral Given 12/19/20 1932)    ED Course  I have reviewed the triage vital signs and the nursing notes.  Pertinent labs & imaging results that were available during my care of the patient were reviewed by me and considered in my medical decision making (see chart for details).    MDM Rules/Calculators/A&P                          75 year old female presents the emergency department with mechanical fall and left hip/thigh pain.  She has been ambulatory, vitals are normal.  Physical exam is unremarkable.  X-rays show no fracture.  Will advise patient for symptomatic treatment and outpatient follow-up.  Patient will be discharged and treated as an outpatient.  Discharge plan and strict return to ED precautions discussed, patient verbalizes understanding and agreement.  Final Clinical Impression(s) / ED Diagnoses Final diagnoses:  None    Rx / DC Orders ED Discharge Orders    None       Rozelle Logan, DO 12/19/20 2002

## 2020-12-19 NOTE — ED Triage Notes (Signed)
Pt states she fell 2 days ago- pain left LE and right knee-to triage in w/c-NAD

## 2021-02-24 ENCOUNTER — Emergency Department (HOSPITAL_COMMUNITY): Payer: Medicare Other

## 2021-02-24 ENCOUNTER — Encounter (HOSPITAL_COMMUNITY): Payer: Self-pay

## 2021-02-24 ENCOUNTER — Inpatient Hospital Stay (HOSPITAL_COMMUNITY): Payer: Medicare Other

## 2021-02-24 ENCOUNTER — Other Ambulatory Visit: Payer: Self-pay

## 2021-02-24 ENCOUNTER — Inpatient Hospital Stay (HOSPITAL_COMMUNITY)
Admission: EM | Admit: 2021-02-24 | Discharge: 2021-03-01 | DRG: 065 | Disposition: A | Discharge status: Rehabilitation Facility | Payer: Medicare Other | Attending: Student in an Organized Health Care Education/Training Program | Admitting: Student in an Organized Health Care Education/Training Program

## 2021-02-24 DIAGNOSIS — I6381 Other cerebral infarction due to occlusion or stenosis of small artery: Principal | ICD-10-CM | POA: Diagnosis present

## 2021-02-24 DIAGNOSIS — E1165 Type 2 diabetes mellitus with hyperglycemia: Secondary | ICD-10-CM | POA: Diagnosis present

## 2021-02-24 DIAGNOSIS — E785 Hyperlipidemia, unspecified: Secondary | ICD-10-CM | POA: Diagnosis present

## 2021-02-24 DIAGNOSIS — R29810 Facial weakness: Secondary | ICD-10-CM | POA: Diagnosis present

## 2021-02-24 DIAGNOSIS — I10 Essential (primary) hypertension: Secondary | ICD-10-CM | POA: Diagnosis present

## 2021-02-24 DIAGNOSIS — E038 Other specified hypothyroidism: Secondary | ICD-10-CM | POA: Diagnosis present

## 2021-02-24 DIAGNOSIS — R531 Weakness: Secondary | ICD-10-CM | POA: Diagnosis present

## 2021-02-24 DIAGNOSIS — E1159 Type 2 diabetes mellitus with other circulatory complications: Secondary | ICD-10-CM

## 2021-02-24 DIAGNOSIS — Z79899 Other long term (current) drug therapy: Secondary | ICD-10-CM

## 2021-02-24 DIAGNOSIS — M109 Gout, unspecified: Secondary | ICD-10-CM | POA: Diagnosis present

## 2021-02-24 DIAGNOSIS — I6502 Occlusion and stenosis of left vertebral artery: Secondary | ICD-10-CM | POA: Diagnosis present

## 2021-02-24 DIAGNOSIS — E042 Nontoxic multinodular goiter: Secondary | ICD-10-CM | POA: Diagnosis present

## 2021-02-24 DIAGNOSIS — G8191 Hemiplegia, unspecified affecting right dominant side: Secondary | ICD-10-CM | POA: Diagnosis present

## 2021-02-24 DIAGNOSIS — Z833 Family history of diabetes mellitus: Secondary | ICD-10-CM

## 2021-02-24 DIAGNOSIS — Z20822 Contact with and (suspected) exposure to covid-19: Secondary | ICD-10-CM | POA: Diagnosis present

## 2021-02-24 DIAGNOSIS — Z8249 Family history of ischemic heart disease and other diseases of the circulatory system: Secondary | ICD-10-CM | POA: Diagnosis not present

## 2021-02-24 DIAGNOSIS — I639 Cerebral infarction, unspecified: Secondary | ICD-10-CM | POA: Diagnosis not present

## 2021-02-24 DIAGNOSIS — I6389 Other cerebral infarction: Secondary | ICD-10-CM | POA: Diagnosis not present

## 2021-02-24 DIAGNOSIS — Z90710 Acquired absence of both cervix and uterus: Secondary | ICD-10-CM | POA: Diagnosis not present

## 2021-02-24 DIAGNOSIS — R471 Dysarthria and anarthria: Secondary | ICD-10-CM | POA: Diagnosis present

## 2021-02-24 DIAGNOSIS — E669 Obesity, unspecified: Secondary | ICD-10-CM | POA: Diagnosis present

## 2021-02-24 DIAGNOSIS — M4812 Ankylosing hyperostosis [Forestier], cervical region: Secondary | ICD-10-CM | POA: Diagnosis present

## 2021-02-24 DIAGNOSIS — E119 Type 2 diabetes mellitus without complications: Secondary | ICD-10-CM

## 2021-02-24 DIAGNOSIS — E1169 Type 2 diabetes mellitus with other specified complication: Secondary | ICD-10-CM | POA: Diagnosis not present

## 2021-02-24 DIAGNOSIS — E041 Nontoxic single thyroid nodule: Secondary | ICD-10-CM | POA: Diagnosis present

## 2021-02-24 LAB — COMPREHENSIVE METABOLIC PANEL
ALT: 15 U/L (ref 0–44)
AST: 19 U/L (ref 15–41)
Albumin: 3.6 g/dL (ref 3.5–5.0)
Alkaline Phosphatase: 88 U/L (ref 38–126)
Anion gap: 6 (ref 5–15)
BUN: 23 mg/dL (ref 8–23)
CO2: 24 mmol/L (ref 22–32)
Calcium: 9.3 mg/dL (ref 8.9–10.3)
Chloride: 107 mmol/L (ref 98–111)
Creatinine, Ser: 0.9 mg/dL (ref 0.44–1.00)
GFR, Estimated: >60 mL/min (ref >60)
Glucose, Bld: 240 mg/dL — ABNORMAL HIGH (ref 70–99)
Potassium: 4 mmol/L (ref 3.5–5.1)
Sodium: 137 mmol/L (ref 135–145)
Total Bilirubin: 0.7 mg/dL (ref 0.3–1.2)
Total Protein: 7.6 g/dL (ref 6.5–8.1)

## 2021-02-24 LAB — CBC
HCT: 38.4 % (ref 36.0–46.0)
Hemoglobin: 12.4 g/dL (ref 12.0–15.0)
MCH: 29 pg (ref 26.0–34.0)
MCHC: 32.3 g/dL (ref 30.0–36.0)
MCV: 89.7 fL (ref 80.0–100.0)
Platelets: 193 10*3/uL (ref 150–400)
RBC: 4.28 MIL/uL (ref 3.87–5.11)
RDW: 13 % (ref 11.5–15.5)
WBC: 6.6 10*3/uL (ref 4.0–10.5)
nRBC: 0 % (ref 0.0–0.2)

## 2021-02-24 LAB — RESP PANEL BY RT-PCR (FLU A&B, COVID) ARPGX2
Influenza A by PCR: NEGATIVE
Influenza B by PCR: NEGATIVE
SARS Coronavirus 2 by RT PCR: NEGATIVE

## 2021-02-24 LAB — DIFFERENTIAL
Abs Immature Granulocytes: 0.03 10*3/uL (ref 0.00–0.07)
Basophils Absolute: 0 10*3/uL (ref 0.0–0.1)
Basophils Relative: 0 %
Eosinophils Absolute: 0.2 10*3/uL (ref 0.0–0.5)
Eosinophils Relative: 2 %
Immature Granulocytes: 1 %
Lymphocytes Relative: 21 %
Lymphs Abs: 1.4 10*3/uL (ref 0.7–4.0)
Monocytes Absolute: 0.3 10*3/uL (ref 0.1–1.0)
Monocytes Relative: 5 %
Neutro Abs: 4.7 10*3/uL (ref 1.7–7.7)
Neutrophils Relative %: 71 %

## 2021-02-24 LAB — CBG MONITORING, ED
Glucose-Capillary: 236 mg/dL — ABNORMAL HIGH (ref 70–99)
Glucose-Capillary: 137 mg/dL — ABNORMAL HIGH (ref 70–99)

## 2021-02-24 LAB — ECHOCARDIOGRAM COMPLETE
Area-P 1/2: 3.08 cm2
Calc EF: 54.4 %
S' Lateral: 3.7 cm
Single Plane A2C EF: 58.6 %
Single Plane A4C EF: 48.7 %

## 2021-02-24 LAB — I-STAT CHEM 8, ED
BUN: 25 mg/dL — ABNORMAL HIGH (ref 8–23)
Calcium, Ion: 1.16 mmol/L (ref 1.15–1.40)
Chloride: 107 mmol/L (ref 98–111)
Creatinine, Ser: 0.8 mg/dL (ref 0.44–1.00)
Glucose, Bld: 244 mg/dL — ABNORMAL HIGH (ref 70–99)
HCT: 38 % (ref 36.0–46.0)
Hemoglobin: 12.9 g/dL (ref 12.0–15.0)
Potassium: 4 mmol/L (ref 3.5–5.1)
Sodium: 141 mmol/L (ref 135–145)
TCO2: 21 mmol/L — ABNORMAL LOW (ref 22–32)

## 2021-02-24 LAB — HEMOGLOBIN A1C
Hgb A1c MFr Bld: 7.4 % — ABNORMAL HIGH (ref 4.8–5.6)
Mean Plasma Glucose: 165.68 mg/dL

## 2021-02-24 LAB — ETHANOL: Alcohol, Ethyl (B): <10 mg/dL (ref <10)

## 2021-02-24 LAB — PROTIME-INR
INR: 1.1 (ref 0.8–1.2)
Prothrombin Time: 14.1 seconds (ref 11.4–15.2)

## 2021-02-24 LAB — LIPID PANEL
Cholesterol: 171 mg/dL (ref 0–200)
HDL: 34 mg/dL — ABNORMAL LOW (ref >40)
LDL Cholesterol: 121 mg/dL — ABNORMAL HIGH (ref 0–99)
Total CHOL/HDL Ratio: 5 RATIO
Triglycerides: 80 mg/dL (ref <150)
VLDL: 16 mg/dL (ref 0–40)

## 2021-02-24 LAB — TSH: TSH: 0.014 u[IU]/mL — ABNORMAL LOW (ref 0.350–4.500)

## 2021-02-24 LAB — APTT: aPTT: 31 seconds (ref 24–36)

## 2021-02-24 MED ORDER — SENNOSIDES-DOCUSATE SODIUM 8.6-50 MG PO TABS
1.0000 | ORAL_TABLET | Freq: Every evening | ORAL | Status: DC | PRN
  Administered 2021-02-28: 1 via ORAL
  Filled 2021-02-24: qty 1

## 2021-02-24 MED ORDER — ACETAMINOPHEN 160 MG/5ML PO SOLN: 650.0000 mg | ORAL | Status: DC | PRN

## 2021-02-24 MED ORDER — IOHEXOL 350 MG/ML SOLN
75.0000 mL | Freq: Once | INTRAVENOUS | Status: AC | PRN
  Administered 2021-02-24: 75 mL via INTRAVENOUS

## 2021-02-24 MED ORDER — CLOPIDOGREL BISULFATE 75 MG PO TABS
75.0000 mg | ORAL_TABLET | Freq: Every day | ORAL | Status: DC
  Administered 2021-02-25 – 2021-03-01 (×5): 75 mg via ORAL
  Filled 2021-02-24 (×5): qty 1

## 2021-02-24 MED ORDER — ASPIRIN 325 MG PO TABS
325.0000 mg | ORAL_TABLET | Freq: Once | ORAL | Status: AC
  Administered 2021-02-24: 325 mg via ORAL
  Filled 2021-02-24: qty 1

## 2021-02-24 MED ORDER — ATORVASTATIN CALCIUM 40 MG PO TABS
40.0000 mg | ORAL_TABLET | Freq: Every day | ORAL | Status: DC
  Administered 2021-02-24 – 2021-03-01 (×6): 40 mg via ORAL
  Filled 2021-02-24 (×6): qty 1

## 2021-02-24 MED ORDER — ASPIRIN 300 MG RE SUPP
300.0000 mg | Freq: Every day | RECTAL | Status: DC
Start: 2021-02-24 — End: 2021-02-24

## 2021-02-24 MED ORDER — ACETAMINOPHEN 650 MG RE SUPP: 650.0000 mg | RECTAL | Status: DC | PRN

## 2021-02-24 MED ORDER — ENOXAPARIN SODIUM 40 MG/0.4ML IJ SOSY
40.0000 mg | PREFILLED_SYRINGE | INTRAMUSCULAR | Status: DC
  Administered 2021-02-24 – 2021-03-01 (×5): 40 mg via SUBCUTANEOUS
  Filled 2021-02-24 (×6): qty 0.4

## 2021-02-24 MED ORDER — ASPIRIN 300 MG RE SUPP: 300.0000 mg | Freq: Once | RECTAL | Status: AC

## 2021-02-24 MED ORDER — CLOPIDOGREL BISULFATE 300 MG PO TABS
300.0000 mg | ORAL_TABLET | Freq: Once | ORAL | Status: AC
  Administered 2021-02-24: 300 mg via ORAL
  Filled 2021-02-24: qty 1

## 2021-02-24 MED ORDER — ACETAMINOPHEN 325 MG PO TABS
650.0000 mg | ORAL_TABLET | ORAL | Status: DC | PRN
  Administered 2021-02-25 – 2021-02-26 (×3): 650 mg via ORAL
  Filled 2021-02-24 (×3): qty 2

## 2021-02-24 MED ORDER — ASPIRIN EC 81 MG PO TBEC
81.0000 mg | DELAYED_RELEASE_TABLET | Freq: Every day | ORAL | Status: DC
  Administered 2021-02-25 – 2021-03-01 (×5): 81 mg via ORAL
  Filled 2021-02-24 (×5): qty 1

## 2021-02-24 MED ORDER — ASPIRIN 325 MG PO TABS
325.0000 mg | ORAL_TABLET | Freq: Every day | ORAL | Status: DC
Start: 2021-02-24 — End: 2021-02-24

## 2021-02-24 MED ORDER — ENOXAPARIN SODIUM 40 MG/0.4ML IJ SOSY: 40.0000 mg | PREFILLED_SYRINGE | INTRAMUSCULAR | Status: DC

## 2021-02-24 MED ORDER — STROKE: EARLY STAGES OF RECOVERY BOOK: Freq: Once | Status: DC

## 2021-02-24 NOTE — ED Notes (Signed)
CBG 137 

## 2021-02-24 NOTE — Consult Note (Signed)
Neurology Consultation Reason for Consult: Code stroke right sided weakness Requesting Physician: Dione Booze  CC: Right sided weakness  History is obtained from: Patient, chart review and daughter   HPI: Carrie Kline is a 75 y.o. left handed woman with no significant past medical history, presenting with acute onset right-sided weakness.  She was in her normal state of health earlier this evening, had visited with daughter and then went home, per patient she went to bed around 8 PM although per her daughter she went to bed more around 9:30 or 10 PM.  At that time her right side was working normally.  She woke to use the bathroom around 12:30 AM and immediately fell getting out of bed as her right leg was not working well.  EMS was activated but her weakness was attributed to having slept on her leg funny and she had minimal difficulty with her arm or speech at that time.  Unfortunately her symptoms worsened and at about 4 AM she called her daughter again reporting she could no longer move her right side at all and her speech was significantly slurred.  On daughter's arrival she noted the patient also had a significant right-sided facial droop.  EMS was activated again and patient was transported to [facility] as code stroke with initial last known well time told to the second EMS team of 1 AM.  On review of systems the patient reports chronic daily headache, some stress at work the day prior to presentation and notes that she last had a regular doctor's appointment about 1 year ago and she has no known history of diabetes or hypertension  LKW: 10 PM on 6/30 tPA given?: No, due to out of the window Premorbid modified rankin scale:      0 - No symptoms.  ROS: All other review of systems was negative except as noted in the HPI.   Past Medical History:  Diagnosis Date   Gout    Seasonal allergies    Past Surgical History:  Procedure Laterality Date   ABDOMINAL HYSTERECTOMY     BACK  SURGERY     GANGLION CYST EXCISION     Current Outpatient Medications  Medication Instructions   amoxicillin-clavulanate (AUGMENTIN) 875-125 MG tablet 1 tablet, Oral, Every 12 hours   benzonatate (TESSALON) 100 mg, Oral, 3 times daily PRN   ondansetron (ZOFRAN ODT) 4 mg, Oral, Every 6 hours PRN  -Per daughter the patient occasionally takes a medication for her gout but otherwise does not take any regular medication  No family history on file. Patient and daughter report no known history of stroke, diabetes or hypertension in the family  Social History:  reports that she has never smoked. She has never used smokeless tobacco. She reports that she does not drink alcohol and does not use drugs.  Exam: Current vital signs: There were no vitals taken for this visit. Vital signs in last 24 hours:     Physical Exam  Constitutional: Appears well-developed and well-nourished.  Psych: Affect appropriate to situation, slightly tearful when told the diagnosis Eyes: No scleral injection HENT: No oropharyngeal obstruction.  MSK: no joint deformities.  Cardiovascular: Normal rate and regular rhythm.  Respiratory: Effort normal, non-labored breathing GI: Soft.  No distension. There is no tenderness.  Skin: Warm dry and intact visible skin  Neuro: Mental Status: Patient is awake, alert, oriented to person, place, month, year, and situation. Patient is able to give a clear and coherent history. No signs of  aphasia or neglect Cranial Nerves: II: Visual Fields are full. Pupils are equal, round, and reactive to light.   III,IV, VI: EOMI without ptosis or diploplia.  V: Facial sensation is symmetric to temperature VII: Facial movement is symmetric.  VIII: hearing is intact to voice X: Uvula elevates symmetrically XI: Shoulder shrug is symmetric. XII: tongue is midline without atrophy or fasciculations.  Motor: Tone is normal. Bulk is normal. 5/5 strength was present in the left upper  extremity and lower extremity.  On the right she was 2/5 in the upper extremity and 1/5 at the lower extremity Sensory: She reports sensation is about 30% reduced in the right face arm and leg Deep Tendon Reflexes: 2+ and symmetric in the biceps and patellae.  Plantars: Toes are upgoing on the right leg and downgoing on the left Cerebellar: FNF and HKS are intact on the left  NIHSS total 9 Score breakdown: 2 points for facial droop, 2 points for right arm weakness, 3 points for right leg weakness, one-point for sensory loss, one-point for dysarthria    I have reviewed labs in epic and the results pertinent to this consultation are: Glucose 244  Cr 0.80   I have reviewed the images obtained: Head CT with some chronic microvascular disease but no clear acute intracranial process CTA without evidence of large vessel occlusion, though there is moderate posterior circulation atherosclerosis  Impression: By clinical assessment 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    Recommendations: #Acute lacunar stroke determined by clinical assessment  - Stroke labs TSH, HgbA1c, fasting lipid panel - MRI brain  - Frequent neuro checks - Echocardiogram - NPO pending swallow eval - Prophylactic therapy-Antiplatelet med: Aspirin - dose 325mg  PO or 300mg  PR, followed by 81 mg daily - Plavix 300 mg load with 75 mg daily for 21 day course once she passes bedside swallow or SLP clears for meds - Risk factor modification, diet, exercise, weight loss, regular medical care  - Telemetry monitoring;  - Blood pressure goal   - Normotension to be achieved gradually given no LVO - PT consult, OT consult, Speech consult - Stroke team to follow  MD-PhD [facility] [phone] Available 7 PM to 7 AM, outside of these hours please call Neurologist on call as listed on  Amion.

## 2021-02-24 NOTE — Evaluation (Signed)
Occupational Therapy Evaluation Patient Details Name: Carrie Kline MRN: [id number] DOB: 21-Jul-1946 Today's Date: 02/24/2021    History of Present Illness Pt adm 7/1 with R sided weakness. 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 PMH - gout.   Clinical Impression   Patient admitted with the diagnosis above.  PTA she lives with her children who can provide assist as needed.  She was independent with all mobility and self care, and she continues to work.  Barriers are listed below.  Currently she is needing up to Max A for lower body ADL, up to Mod A for upper body ADL, and Min A for bed mobility to her strong side.  Given her prior level of function, and motivation to recover and regain her independence, CIR has been recommended.  Patient has good use of her L arm and leg, but is flaccid to her R hemibody.  Acute OT is indicated to maximize her functional status.      Follow Up Recommendations  CIR    Equipment Recommendations  Wheelchair (measurements OT);Wheelchair cushion (measurements OT);3 in 1 bedside commode    Recommendations for Other Services       Precautions / Restrictions Precautions Precautions: Fall Precaution Comments: R hemiparesis Restrictions Weight Bearing Restrictions: No      Mobility Bed Mobility Overal bed mobility: Needs Assistance Bed Mobility: Sidelying to Sit;Sit to Sidelying   Sidelying to sit: Min assist     Sit to sidelying: Min assist   Patient Response: Cooperative  Transfers    Deferred             General transfer comment: Patient deferred today - long day and dinner tray arrived.    Balance Overall balance assessment: Needs assistance Sitting-balance support: No upper extremity supported;Feet unsupported Sitting balance-Leahy Scale: Good                                     ADL either performed or assessed with clinical judgement   ADL Overall ADL's : Needs assistance/impaired Eating/Feeding: Set up;Bed level    Grooming: Wash/dry hands;Wash/dry face;Moderate assistance;Bed level           Upper Body Dressing : Moderate assistance;Sitting   Lower Body Dressing: Maximal assistance;Sitting/lateral leans                       Vision Baseline Vision/History: Wears glasses Wears Glasses: At all times Patient Visual Report: No change from baseline       Perception     Praxis      Pertinent Vitals/Pain Pain Assessment: No/denies pain     Hand Dominance Left   Extremity/Trunk Assessment Upper Extremity Assessment Upper Extremity Assessment: RUE deficits/detail RUE Deficits / Details: flaccid - slight thumb AROM noted.  Able to move through PROM RUE Sensation: WNL RUE Coordination: decreased fine motor;decreased gross motor   Lower Extremity Assessment Lower Extremity Assessment: Defer to PT evaluation   Cervical / Trunk Assessment Cervical / Trunk Assessment: Normal   Communication Communication Communication: No difficulties;Other (comment) (Mild slurred speech)   Cognition Arousal/Alertness: Awake/alert Behavior During Therapy: WFL for tasks assessed/performed Overall Cognitive Status: Within Functional Limits for tasks assessed  Home Living Family/patient expects to be discharged to:: Private residence Living Arrangements: Children Available Help at Discharge: Family;Available 24 hours/day Type of Home: House Home Access: Stairs to enter [employer] of Steps: 1   Home Layout: Two level;1/2 bath on main level;Bed/bath upstairs Alternate Level Stairs-Number of Steps: flight Alternate Level Stairs-Rails: Right Bathroom Shower/Tub: Chief Strategy Officer: Standard     Home Equipment: None          Prior Functioning/Environment Level of Independence: Independent        Comments: Continues to work, and independent with all mobility and ADL/IADL         OT Problem List: Decreased strength;Decreased range of motion;Decreased activity tolerance;Impaired balance (sitting and/or standing);Decreased knowledge of use of DME or AE;Impaired UE functional use      OT Treatment/Interventions: Self-care/ADL training;Therapeutic exercise;Neuromuscular education;DME and/or AE instruction;Balance training;Patient/family education;Therapeutic activities    OT Goals(Current goals can be found in the care plan section) Acute Rehab OT Goals Patient Stated Goal: I want to move better and be independent again OT Goal Formulation: With patient Time For Goal Achievement: 03/10/21 Potential to Achieve Goals: Fair  OT Frequency: Min 2X/week   Barriers to D/C:    none noted       Co-evaluation              AM-PAC OT "6 Clicks" Daily Activity     Outcome Measure Help from another person eating meals?: A Little Help from another person taking care of personal grooming?: A Lot Help from another person toileting, which includes using toliet, bedpan, or urinal?: A Lot Help from another person bathing (including washing, rinsing, drying)?: A Lot Help from another person to put on and taking off regular upper body clothing?: A Little Help from another person to put on and taking off regular lower body clothing?: A Lot 6 Click Score: 14   End of Session Nurse Communication: Mobility status  Activity Tolerance: Patient tolerated treatment well Patient left: in bed;with call bell/phone within reach;with family/visitor present  OT Visit Diagnosis: Unsteadiness on feet (R26.81);Muscle weakness (generalized) (M62.81);Hemiplegia and hemiparesis Hemiplegia - Right/Left: Right Hemiplegia - dominant/non-dominant: Non-Dominant Hemiplegia - caused by: Cerebral infarction                Time: 6789-3810 OT Time Calculation (min): 14 min Charges:  OT General Charges $OT Visit: 1 Visit OT Evaluation $OT Eval Moderate Complexity: 1 Mod  02/24/2021  Rich,  OTR/L  Acute Rehabilitation Services  Office:  [phone]   Suzanna Obey 02/24/2021, 4:34 PM

## 2021-02-24 NOTE — Progress Notes (Signed)
   02/24/21 1600  Clinical Encounter Type  Visited With Patient and family together  Visit Type Social support;Spiritual support  Referral From Family  Consult/Referral To Chaplain  Spiritual Encounters  Spiritual Needs Prayer;Emotional  The chaplain responded to family request for prayer. The chaplain provided social and spiritual support to the patient and the family at bedside. The chaplain prayed at the bedside along with chaplain Vincella. The chaplain provided active listening and emotional support to the patient. The chaplain will follow up as needed.

## 2021-02-24 NOTE — Progress Notes (Signed)
Rehab Admissions Coordinator Note:  Patient was screened by Clois Dupes for appropriateness for an Inpatient Acute Rehab Consult per therapy recs.   At this time, we are recommending Inpatient Rehab consult. I will place order per protocol.  Clois Dupes RN MSN 02/24/2021, 1:43 PM  I can be reached at [phone].

## 2021-02-24 NOTE — H&P (Addendum)
Date: 02/24/2021               Patient Name:  Carrie Kline MRN: [id number]  DOB: July 20, 1946 Age / Sex: 75 y.o., female   PCP: Patient, No Pcp Per (Inactive)         Medical Service: Internal Medicine Teaching Service         Attending Physician: Dr. Oswaldo Done, Marquita Palms, *    First Contact: Edgardo Roys , Med Student Pager: [phone]  Second Contact: Ilene Qua, MD Pager: [phone]  Third Contact: Dolan Amen, MD Pager: [phone]   After Hours (After 5p/  First Contact Pager: [phone]  weekends / holidays): Second Contact Pager: [phone]   SUBJECTIVE   Chief Complaint: Right sided weakness  History of Present Illness: Carrie Kline is a 75 y.o. female with a pertinent PMH of gout and allergic rhinitis, who presents to [facility] with right sided weakness.  Patient states that she was in her usually state of health yesterday evening when she went to bed around 9:30pm. She woke up at 12:30 am to use the bathroom and noticed right leg weakness that caused her to fall. She called EMS, but when they arrived legs weakness  improved enough for her to walk back to bed. As a result, she attributed her weakness to "sleeping on it funny". Her symptoms progressed over the subsequent hours. By 4 am she was experiencing right upper extremity weakness and slurred speech and right fascial droop. EMS was called and the patient was transported to [facility] for a code stroke.   The patient denies any significant past medical history. She does not currently take any medications. 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 She does see a primary care provider in [place], [place] and the last time she was seen was roughly a year ago. The only pertinent past family history consist of her father who passed away from an MI in his 106's and and younger sister with DM.   Medications: Current Meds  Medication Sig   ibuprofen (ADVIL) 200 MG tablet Take  400 mg by mouth every 6 (six) hours as needed for headache.   loratadine (CLARITIN) 10 MG tablet Take 10 mg by mouth daily as needed for allergies.   naproxen sodium (ALEVE) 220 MG tablet Take 220 mg by mouth daily as needed (pain).    Past Medical History:  Past Medical History:  Diagnosis Date   Gout    Seasonal allergies     Social:  Lives - In [place], [place] Occupation - Nurse, adult at childcare center Support - Has children living near by and daughter currently at bedside Level of function - previously independent with ADLs PCP - in [place], unknown name Substance use - no ETOH, tobacco, or illicit drug use  Family History: Family History  Problem Relation Age of Onset   Heart attack Father    Diabetes Sister    Thyroid disease Daughter     Allergies: Allergies as of 02/24/2021 - Review Complete 02/24/2021  Allergen Reaction Noted   Darvon [propoxyphene]  04/22/2017   Hydrocortisone  04/22/2017    Review of Systems: A complete ROS was negative except as per HPI.   OBJECTIVE:   Physical Exam: Blood pressure (!) 150/61, pulse 79, resp. rate 18, SpO2 99 %. Physical Exam Constitutional:      General: She is not in acute distress.    Appearance: She is not ill-appearing.  HENT:  Head: Normocephalic and atraumatic.     Mouth/Throat:     Mouth: Mucous membranes are moist.     Pharynx: Oropharynx is clear.  Eyes:     General: No visual field deficit.    Extraocular Movements: Extraocular movements intact.     Pupils: Pupils are equal, round, and reactive to light.  Cardiovascular:     Rate and Rhythm: Normal rate and regular rhythm.     Pulses: Normal pulses.     Heart sounds: Normal heart sounds. No murmur heard.   No friction rub. No gallop.  Pulmonary:     Effort: Pulmonary effort is normal.     Breath sounds: Normal breath sounds.  Abdominal:     General: Bowel sounds are normal. There is no distension.     Palpations: Abdomen is soft.      Tenderness: There is no abdominal tenderness.  Musculoskeletal:        General: No swelling.     Cervical back: Normal range of motion.     Comments: Decreased AROM of right upper and lower extremity  Skin:    General: Skin is warm and dry.  Neurological:     Mental Status: She is alert.     Cranial Nerves: Dysarthria (slurred) and facial asymmetry (right facial droop, spairing her upper face) present. No cranial nerve deficit.     Sensory: Sensory deficit (Right sided paresthesias/decreased sensation) present.     Motor: Weakness (0/5 RUE, 0/5 RLE, 5/5 LUE, 5/5 LLE) present.     Coordination: Coordination normal. Finger-Nose-Finger Test and Heel to [facility] Test normal.     Labs: CBC    Component Value Date/Time   WBC 6.6 02/24/2021 0452   RBC 4.28 02/24/2021 0452   HGB 12.9 02/24/2021 0501   HCT 38.0 02/24/2021 0501   PLT 193 02/24/2021 0452   MCV 89.7 02/24/2021 0452   MCH 29.0 02/24/2021 0452   MCHC 32.3 02/24/2021 0452   RDW 13.0 02/24/2021 0452   LYMPHSABS 1.4 02/24/2021 0452   MONOABS 0.3 02/24/2021 0452   EOSABS 0.2 02/24/2021 0452   BASOSABS 0.0 02/24/2021 0452     CMP     Component Value Date/Time   NA 141 02/24/2021 0501   K 4.0 02/24/2021 0501   CL 107 02/24/2021 0501   CO2 24 02/24/2021 0452   GLUCOSE 244 (H) 02/24/2021 0501   BUN 25 (H) 02/24/2021 0501   CREATININE 0.80 02/24/2021 0501   CALCIUM 9.3 02/24/2021 0452   PROT 7.6 02/24/2021 0452   ALBUMIN 3.6 02/24/2021 0452   AST 19 02/24/2021 0452   ALT 15 02/24/2021 0452   ALKPHOS 88 02/24/2021 0452   BILITOT 0.7 02/24/2021 0452   GFRNONAA >60 02/24/2021 0452    Imaging: MR BRAIN WO CONTRAST - Acute infarct in the left pons.  Slight edema without mass effect.  CT HEAD CODE STROKE WO CONTRAST - Normal for age non contrast CT appearance of the brain. ASPECTS 10.   CT ANGIO HEAD NECK W WO CM (CODE STROKE) 1. Negative for large vessel occlusion. And no circle-of-Willis branch occlusion identified.   2. Generally mild carotid and anterior circulation atherosclerosis. But more pronounced vertebral and posterior circulation atherosclerosis including: - Moderate stenosis at the origin of the dominant left vertebral artery. - mild to moderate stenoses of the distal PCA branches.  3. A 2 cm left thyroid nodule meets size criteria for further characterization. Recommend Thyroid Ultrasound.  4. Diffuse idiopathic skeletal hyperostosis (DISH).   EKG:  personally reviewed my interpretation is sinus rhythm   ASSESSMENT & PLAN:   Active Problems:   CVA (cerebral vascular accident) (HCC)   Lu Kushnir is a 75 y.o. with pertinent PMH of gout, allergic rhinitis who presented with right-sided weakness and dysarthria and admit for acute left pons infarct on hospital day 0  #Acute Left Pons Infarct: #Moderate Left Vertebral Artery Stenosis: #Mild to Moderate Distal PCA Stenosis: Patient presents after acute on of right-sided hemiparesis with dysarthria secondary to an acute left pons infarct.  Patient does not have any contributing past medical history.  We will continue to assess further risk of ischemic stroke in future. -MRI has been performed -TA head and neck have been performed -Echocardiogram pending -Lipid panel and hemoglobin A1c pending -PT/OT/SLP evaluation pending -Continue DAPT with ASA 81 mg and Plavix 75 mg daily - Will start atorvastatin 40 mg daily, titrate depending on lipid panel -Allow permissive hypertension.  -Frequent neurochecks -We will continue cardiac monitoring to assess for underlying arrhythmias -Stroke swallow passed, will start heart healthy diet Appreciate neurology's assistance with this patient  #Hyperglycemia: Patient presents with hyperglycemia with CBG of 240.  She has no history of diabetes.  Hemoglobin A1c is pending. -We will repeat CBG to confirm persistent elevation and blood sugar -If remains elevated, will start CBG monitoring at every  meal -Hemoglobin A1c pending  #Thyroid Nodule: Patient presents with an incidental 2 cm thyroid nodule.  Patient denies any significant signs or symptoms consistent with hyper/hypothyroidism.  Patient does have a family history of thyroid disease in her daughter.  TSH is pending.  We will order a thyroid ultrasound to further evaluate the nodule. - TSH pending - Thyroid ultrasound  #Diffuse Idiopathic Skeletal Hyperostosis:  Patient has evidence of DISH on CTA.  During my evaluation, the patient did not complain of any symptoms.  Further history regarding her MSK symptoms may be needed.  There is an association between DISH and gout.  There is no additional work-up needed at this time we will continue to monitor for symptoms.  Diet: Heart Healthy VTE: Enoxaparin IVF: None,None Code: Full  Anticipated Discharge Location: CIR Barriers to Discharge: Stroke work-up and physical therapy evaluation  Dispo: Admit patient to Inpatient with expected length of stay greater than 2 midnights.  Signed: Chari Manning, D.O.  Internal Medicine Resident, PGY-3 [facility]  Pager: [phone] 9:28 AM, 02/24/2021   Please contact the on call pager after 5 pm and on weekends at [phone].

## 2021-02-24 NOTE — ED Provider Notes (Signed)
[facility] EMERGENCY DEPARTMENT Provider Note   CSN: [id number] Arrival date & time: 02/24/21  0449     History Chief Complaint  Patient presents with   Code Stroke    Carrie Kline is a 75 y.o. female.  The history is provided by the patient and the EMS personnel.  She has history of gout and comes in as a code stroke.  She was last known normal at about 9 PM when she went to bed.  She woke up at about 1230, and fell, but she thought it was because her right leg was asleep.  She woke up at about 3 and had weakness of her right arm and her speech was altered.  She denies any headache.   Past Medical History:  Diagnosis Date   Gout    Seasonal allergies     There are no problems to display for this patient.   Past Surgical History:  Procedure Laterality Date   ABDOMINAL HYSTERECTOMY     BACK SURGERY     GANGLION CYST EXCISION       OB History   No obstetric history on file.     No family history on file.  Social History   Tobacco Use   Smoking status: Never   Smokeless tobacco: Never  Substance Use Topics   Alcohol use: No   Drug use: No    Home Medications Prior to Admission medications   Medication Sig Start Date End Date Taking? Authorizing Provider  amoxicillin-clavulanate (AUGMENTIN) 875-125 MG tablet Take 1 tablet by mouth every 12 (twelve) hours. 03/28/20   Vanetta Mulders, MD  benzonatate (TESSALON) 100 MG capsule Take 1 capsule (100 mg total) by mouth 3 (three) times daily as needed for cough. 10/31/17   Ward, Layla Maw, DO  ondansetron (ZOFRAN ODT) 4 MG disintegrating tablet Take 1 tablet (4 mg total) by mouth every 6 (six) hours as needed for nausea or vomiting. 10/31/17   Ward, Layla Maw, DO    Allergies    Darvon [propoxyphene] and Hydrocortisone  Review of Systems   Review of Systems  All other systems reviewed and are negative.  Physical Exam Updated Vital Signs BP 116/69   Pulse 70   Resp 17   SpO2 100%   Physical  Exam Vitals and nursing note reviewed.  75 year old female, resting comfortably and in no acute distress. Vital signs are normal. Oxygen saturation is 100%, which is normal. Head is normocephalic and atraumatic. PERRLA, EOMI. Oropharynx is clear. Neck is nontender and supple without adenopathy or JVD.  No carotid bruits are heard. Back is nontender and there is no CVA tenderness. Lungs are clear without rales, wheezes, or rhonchi. Chest is nontender. Heart has regular rate and rhythm without murmur. Abdomen is soft, flat, nontender without masses or hepatosplenomegaly and peristalsis is normoactive. Extremities have no cyanosis or edema, full range of motion is present. Skin is warm and dry without rash. Neurologic: : Awake and alert, mild right central facial droop is present.  Speech appears normal.  There is a fairly dense right hemiparesis.  Right arm strength is 2+/5, right leg strength is 1+/5.  Left arm and leg strength are normal.  Finger-nose test is normal on the left.  There is slight decrease sensation on the right arm and right leg.  Plantar response is neutral on the left, extensor on the right.  ED Results / Procedures / Treatments   Labs (all labs ordered are listed, but only  abnormal results are displayed) Labs Reviewed  COMPREHENSIVE METABOLIC PANEL - Abnormal; Notable for the following components:      Result Value   Glucose, Bld 240 (*)    All other components within normal limits  I-STAT CHEM 8, ED - Abnormal; Notable for the following components:   BUN 25 (*)    Glucose, Bld 244 (*)    TCO2 21 (*)    All other components within normal limits  CBG MONITORING, ED - Abnormal; Notable for the following components:   Glucose-Capillary 236 (*)    All other components within normal limits  RESP PANEL BY RT-PCR (FLU A&B, COVID) ARPGX2  ETHANOL  PROTIME-INR  APTT  CBC  DIFFERENTIAL  RAPID URINE DRUG SCREEN, HOSP PERFORMED  URINALYSIS, ROUTINE W REFLEX MICROSCOPIC     EKG EKG Interpretation  Date/Time:  Friday February 24 2021 05:20:34 EDT Ventricular Rate:  89 PR Interval:  151 QRS Duration: 84 QT Interval:  378 QTC Calculation: 460 R Axis:   46 Text Interpretation: Sinus rhythm Probable left atrial enlargement Baseline wander in lead(s) V1 When compared with ECG of 10/31/2017, No significant change was found Confirmed by Dione Booze ([id number]) on 02/24/2021 5:41:42 AM  Radiology CT HEAD CODE STROKE WO CONTRAST  Result Date: 02/24/2021 CLINICAL DATA:  Code stroke.  75 year old female EXAM: CT HEAD WITHOUT CONTRAST TECHNIQUE: Contiguous axial images were obtained from the base of the skull through the vertex without intravenous contrast. COMPARISON:  Report of [facility] head CT 07/16/2011 (no images available). FINDINGS: Brain: No midline shift, mass effect, or evidence of intracranial mass lesion. No ventriculomegaly. No acute intracranial hemorrhage identified. Cerebral volume is within normal limits for age. Gray-white matter differentiation appears symmetric and within normal limits for age. No evidence of acute or chronic ischemia is identified. Vascular: Calcified atherosclerosis at the skull base. No suspicious intracranial vascular hyperdensity. Left vertebral artery appears dominant. Skull: No acute osseous abnormality identified. Hyperostosis frontalis, normal variant. 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 Tympanic cavities, hyperplastic petrous apex and mastoid air cells are clear. Other: Visualized orbits and scalp soft tissues are within normal limits. ASPECTS [facility] Stroke Program Early CT Score) Total score (0-10 with 10 being normal): 10 IMPRESSION: 1. Normal for age non contrast CT appearance of the brain. ASPECTS 10. 2. These results were communicated to Dr. Iver Nestle at 5:08 am on 02/24/2021 by text page via the [facility]  messaging system. Electronically Signed   By: Odessa Fleming M.D.   On: 02/24/2021 05:08   CT ANGIO HEAD NECK W WO CM (CODE STROKE)  Result Date: 02/24/2021 CLINICAL DATA:  Code stroke. 75 year old female woke with slurred speech, right facial droop, right extremity weakness. EXAM: CT ANGIOGRAPHY HEAD AND NECK TECHNIQUE: Multidetector CT imaging of the head and neck was performed using the standard protocol during bolus administration of intravenous contrast. Multiplanar CT image reconstructions and MIPs were obtained to evaluate the vascular anatomy. Carotid stenosis measurements (when applicable) are obtained utilizing NASCET criteria, using the distal internal carotid diameter as the denominator. CONTRAST:  47mL OMNIPAQUE IOHEXOL 350 MG/ML SOLN COMPARISON:  Plain head CT 0502 hours. FINDINGS: CTA NECK Skeleton: Bulky anterior endplate osteophytes in the cervical and upper thoracic spine resulting in some interbody ankylosis, in keeping with Diffuse idiopathic skeletal hyperostosis (DISH). Occasional dental periapical lucency. Partially visible upper thoracic scoliosis. No acute osseous abnormality identified. Upper chest: Negative upper lungs.  Other neck: 2 cm left thyroid nodule Recommend thyroid US (ref: J Am Coll Radiol. 2015 Feb;12(2): 143-50).Partially retropharyngeal course of both carotids, normal variant. Otherwise negative neck soft tissues. Aortic arch: 3 vessel arch configuration. Mild for age arch atherosclerosis. Right carotid system: Tortuous brachiocephalic artery with normal right CCA origin. Mildly tortuous and retropharyngeal right CCA. Negative right carotid bifurcation. Mildly tortuous cervical right ICA. Left carotid system: Mildly tortuous proximal left CCA. Mild soft plaque at the level of the thyroid. No CCA stenosis. Minimal calcified plaque at the lateral left ICA origin and bulb without stenosis. Vertebral arteries: Tortuous proximal right subclavian artery without plaque or stenosis.  Normal right vertebral artery origin. Somewhat non dominant right vertebral artery with tortuous V2 segment but no plaque or stenosis to the skull base. Mild atherosclerosis in the proximal left subclavian artery without significant stenosis. Calcified plaque at the left vertebral artery origin with moderate origin stenosis (series 9, image 113). But otherwise patent and mildly dominant left vertebral artery to the skull base with no additional plaque. CTA HEAD Posterior circulation: Dominant left V4 segment with calcified plaque but only mild stenosis (series 7, image 132). Tortuous vertebrobasilar junction. Normal PICA origins. Non dominant distal right vertebral artery without stenosis. Patent basilar artery without stenosis. Patent SCA and left PCA origins. Fetal right PCA origin. Diminutive or absent left posterior communicating artery. Proximal PCAs are within normal limits. The bilateral P3 branches are mildly to moderately irregular, more so on the right (series 12, image 18). Anterior circulation: Both ICA siphons are patent. Cavernous and supraclinoid left siphon calcified plaque results in only mild stenosis. Similar right siphon mild calcified plaque without significant stenosis. Normal right posterior communicating artery origin. Patent carotid termini, MCA and ACA origins. A1 segments with diminutive anterior communicating artery. Bilateral ACA branches are within normal limits. Right MCA M1 segment, bifurcation, and right MCA branches are within normal limits. Left MCA M1 segment and by or trifurcation are patent with no stenosis identified. No left MCA branch occlusion is identified. Venous sinuses: Patent. Left transverse and sigmoid sinuses appear dominant. Anatomic variants: Mildly dominant left vertebral artery. Fetal type right PCA origin. Review of the MIP images confirms the above findings IMPRESSION: 1. Negative for large vessel occlusion. And no circle-of-Willis branch occlusion identified.  2. Generally mild carotid and anterior circulation atherosclerosis. But more pronounced vertebral and posterior circulation atherosclerosis including: - Moderate stenosis at the origin of the dominant left vertebral artery. - mild to moderate stenoses of the distal PCA branches. 3. A 2 cm left thyroid nodule meets size criteria for further characterization. Recommend Thyroid Ultrasound. 4. Diffuse idiopathic skeletal hyperostosis (DISH). Electronically Signed   By: Odessa Fleming M.D.   On: 02/24/2021 05:46    Procedures Procedures  CRITICAL CARE Performed by: Dione Booze Total critical care time: 55 minutes Critical care time was exclusive of separately billable procedures and treating other patients. Critical care was necessary to treat or prevent imminent or life-threatening deterioration. Critical care was time spent personally by me on the following activities: development of treatment plan with patient and/or surrogate as well as nursing, discussions with consultants, evaluation of patient's response to treatment, examination of patient, obtaining history from patient or surrogate, ordering and performing treatments and interventions, ordering and review of laboratory studies, ordering and review of radiographic studies, pulse oximetry and re-evaluation of patient's condition.  Medications Ordered in ED Medications   stroke: mapping our early stages of recovery book (has no administration in time range)  aspirin EC tablet 81 mg (has no administration in time range)  clopidogrel (PLAVIX) tablet 300 mg (300 mg Oral Given 02/24/21 0615)    Followed by  clopidogrel (PLAVIX) tablet 75 mg (has no administration in time range)  iohexol (OMNIPAQUE) 350 MG/ML injection 75 mL (75 mLs Intravenous Contrast Given 02/24/21 0514)  aspirin suppository 300 mg ( Rectal See Alternative 02/24/21 0615)    Or  aspirin tablet 325 mg (325 mg Oral Given 02/24/21 [id number])    ED Course  I have reviewed the triage vital signs and  the nursing notes.  Pertinent labs & imaging results that were available during my care of the patient were reviewed by me and considered in my medical decision making (see chart for details).    MDM Rules/Calculators/A&P                         Left hemisphere stroke with right-sided weakness.  She is outside the window for thrombolytic treatment.  She will need to be admitted for stroke work-up.  Old records are reviewed, and she has no relevant past visits.  CT of head shows no acute process.  CT angiogram of the head and neck have incidental finding of thyroid nodule but no critical stenosis.  Labs are unremarkable.  Case is discussed with Dr.Steen of internal medicine teaching service who agrees to admit the patient.  Final Clinical Impression(s) / ED Diagnoses Final diagnoses:  Cerebrovascular accident (CVA), unspecified mechanism (HCC)  Thyroid nodule    Rx / DC Orders ED Discharge Orders     None        Dione BoozeGlick, Onisha Cedeno, MD 02/24/21 [phone]

## 2021-02-24 NOTE — Evaluation (Signed)
Physical Therapy Evaluation Patient Details Name: Carrie Kline MRN: [id number] DOB: 02-14-1946 Today's Date: 02/24/2021   History of Present Illness  Pt adm 7/1 with rt sided weakness. MRI showed acute infarct in lt pons. PMH - gout.  Clinical Impression  Pt presents to PT with decr mobility due to rt hemiplegia. Pt very motivated to work toward incr independence and I believe she would be excellent CIR candidate    Follow Up Recommendations CIR    Equipment Recommendations  Wheelchair (measurements PT);Wheelchair cushion (measurements PT)    Recommendations for Other Services       Precautions / Restrictions Precautions Precautions: Fall      Mobility  Bed Mobility Overal bed mobility: Needs Assistance Bed Mobility: Sidelying to Sit;Sit to Sidelying   Sidelying to sit: Min assist     Sit to sidelying: Min assist General bed mobility comments: Assist to bring RLE off of bed and elevate trunk into sitting. Assist to bring RLE back up into bed to return to supine.    Transfers                 General transfer comment: Unable to safely attempt due to weakness on RLE and height of ED stretcher  Ambulation/Gait                Stairs            Wheelchair Mobility    Modified Rankin (Stroke Patients Only) Modified Rankin (Stroke Patients Only) Pre-Morbid Rankin Score: No symptoms Modified Rankin: Severe disability     Balance Overall balance assessment: Needs assistance Sitting-balance support: No upper extremity supported;Feet unsupported Sitting balance-Leahy Scale: Good                                       Pertinent Vitals/Pain Pain Assessment: No/denies pain    Home Living Family/patient expects to be discharged to:: Private residence Living Arrangements: Children Available Help at Discharge: Family Type of Home: House Home Access: Stairs to enter   Secretary/administrator of Steps: 1 Home Layout: Two level;1/2  bath on main level;Bed/bath upstairs Home Equipment: None Additional Comments: Pt plans to go home with daughter to the above house at DC    Prior Function Level of Independence: Independent         Comments: Works in Scientist, clinical (histocompatibility and immunogenetics) Dominance   Dominant Hand: Left    Extremity/Trunk Assessment   Upper Extremity Assessment Upper Extremity Assessment: Defer to OT evaluation    Lower Extremity Assessment Lower Extremity Assessment: RLE deficits/detail RLE Deficits / Details: Hip/knee with trace. Ankle 0/5       Communication   Communication: No difficulties  Cognition Arousal/Alertness: Awake/alert Behavior During Therapy: WFL for tasks assessed/performed Overall Cognitive Status: Within Functional Limits for tasks assessed                                 General Comments: Not sure she fully understands the extent of her deficits      General Comments      Exercises     Assessment/Plan    PT Assessment Patient needs continued PT services  PT Problem List Decreased strength;Decreased balance;Decreased mobility       PT Treatment Interventions DME instruction;Gait training;Functional mobility training;Therapeutic activities;Therapeutic exercise;Balance training;Neuromuscular re-education;Patient/family education;Wheelchair mobility training    PT  Goals (Current goals can be found in the Care Plan section)  Acute Rehab PT Goals Patient Stated Goal: go home PT Goal Formulation: With patient Time For Goal Achievement: 03/10/21 Potential to Achieve Goals: Good    Frequency Min 4X/week   Barriers to discharge Inaccessible home environment Stairs in home    Co-evaluation               AM-PAC PT "6 Clicks" Mobility  Outcome Measure Help needed turning from your back to your side while in a flat bed without using bedrails?: A Little Help needed moving from lying on your back to sitting on the side of a flat bed without using  bedrails?: A Little Help needed moving to and from a bed to a chair (including a wheelchair)?: Total Help needed standing up from a chair using your arms (e.g., wheelchair or bedside chair)?: Total Help needed to walk in hospital room?: Total Help needed climbing 3-5 steps with a railing? : Total 6 Click Score: 10    End of Session   Activity Tolerance: Patient tolerated treatment well Patient left: in bed;with call bell/phone within reach;with family/visitor present   PT Visit Diagnosis: Hemiplegia and hemiparesis;Other abnormalities of gait and mobility (R26.89) Hemiplegia - Right/Left: Right Hemiplegia - dominant/non-dominant: Non-dominant Hemiplegia - caused by: Cerebral infarction    Time: 2025-4270 PT Time Calculation (min) (ACUTE ONLY): 13 min   Charges:   PT Evaluation $PT Eval Moderate Complexity: 1 Mod          [facility] PT Acute Rehabilitation Services Pager [phone] Office [phone]    Angelina Ok [facility] 02/24/2021, 12:25 PM

## 2021-02-24 NOTE — ED Notes (Signed)
Patient transported to MRI 

## 2021-02-24 NOTE — ED Triage Notes (Signed)
Pt comes via [facility], LSN 830, woke up this AM around 0030 with slurred speech, R sided facial droop, R arm and L weakness and tingling

## 2021-02-24 NOTE — Progress Notes (Signed)
STROKE TEAM PROGRESS NOTE   SUBJECTIVE (INTERVAL HISTORY) Her daughter is at the bedside.  Overall her condition is gradually improving.  Patient lying in bed, awake alert, mild dysarthria, right lower extremity muscle strength is improving.   OBJECTIVE Temp:  [98 F (36.7 C)-98.4 F (36.9 C)] 98.4 F (36.9 C) (07/01 1512) Pulse Rate:  [70-91] 75 (07/01 1512) Cardiac Rhythm: Normal sinus rhythm (07/01 1515) Resp:  [14-20] 18 (07/01 1512) BP: (116-164)/(54-89) 164/79 (07/01 1512) SpO2:  [99 %-100 %] 100 % (07/01 1512)  Recent Labs  Lab 02/24/21 0455 02/24/21 0949  GLUCAP 236* 137*   Recent Labs  Lab 02/24/21 0452 02/24/21 0501  NA 137 141  K 4.0 4.0  CL 107 107  CO2 24  --   GLUCOSE 240* 244*  BUN 23 25*  CREATININE 0.90 0.80  CALCIUM 9.3  --    Recent Labs  Lab 02/24/21 0452  AST 19  ALT 15  ALKPHOS 88  BILITOT 0.7  PROT 7.6  ALBUMIN 3.6   Recent Labs  Lab 02/24/21 0452 02/24/21 0501  WBC 6.6  --   NEUTROABS 4.7  --   HGB 12.4 12.9  HCT 38.4 38.0  MCV 89.7  --   PLT 193  --    No results for input(s): CKTOTAL, CKMB, CKMBINDEX, TROPONINI in the last 168 hours. Recent Labs    02/24/21 0452  LABPROT 14.1  INR 1.1   No results for input(s): COLORURINE, LABSPEC, PHURINE, GLUCOSEU, HGBUR, BILIRUBINUR, KETONESUR, PROTEINUR, UROBILINOGEN, NITRITE, LEUKOCYTESUR in the last 72 hours.  Invalid input(s): APPERANCEUR  No results found for: CHOL, TRIG, HDL, CHOLHDL, VLDL, LDLCALC Lab Results  Component Value Date   HGBA1C 7.4 (H) 02/24/2021   No results found for: LABOPIA, COCAINSCRNUR, LABBENZ, AMPHETMU, THCU, LABBARB  Recent Labs  Lab 02/24/21 0452  ETH <10    I have personally reviewed the radiological images below and agree with the radiology interpretations.  MR BRAIN WO CONTRAST  Result Date: 02/24/2021 CLINICAL DATA:  Neuro deficit, acute stroke suspected. EXAM: MRI HEAD WITHOUT CONTRAST TECHNIQUE: Multiplanar, multiecho pulse sequences of  the brain and surrounding structures were obtained without intravenous contrast. COMPARISON:  Same day CT imaging FINDINGS: Brain: Acute infarct in the left pons (series 5, images 64/65). Slight edema without mass effect. No acute hemorrhage, hydrocephalus, mass lesion, abnormal mass effect, or extra-axial fluid collection. There are a few T2/FLAIR hyperintensities within the white matter, most likely related to mild for age chronic microvascular ischemic disease. Partially empty sella. Vascular: Further evaluated on same day CTA. Skull and upper cervical spine: Normal marrow signal. Sinuses/Orbits: Expanded/opacified left ethmoid air cell. Mild-to-moderate paranasal sinus mucosal thickening. Unremarkable orbits. Other: No mastoid effusions. IMPRESSION: Acute infarct in the left pons.  Slight edema without mass effect. Electronically Signed   By: Feliberto HartsFrederick S Jones MD   On: 02/24/2021 08:23   ECHOCARDIOGRAM COMPLETE  Result Date: 02/24/2021    ECHOCARDIOGRAM REPORT   Patient Name:   Carrie CollinVIRGIE Loss Date of Exam: 02/24/2021 Medical Rec #:  [id number]    Height:       63.0 in Accession #:    [id number]   Weight:       210.0 lb Date of Birth:  September 03, 1945   BSA:          1.974 m Patient Age:    75 years     BP:           150/61 mmHg Patient Gender: F  HR:           80 bpm. Exam Location:  Inpatient Procedure: 2D Echo, Cardiac Doppler and Color Doppler Indications:    Stroke I63.9  History:        Patient has no prior history of Echocardiogram examinations.  Sonographer:    Renella Cunas RDCS Referring Phys: [id number] SRISHTI L BHAGAT IMPRESSIONS  1. Left ventricular ejection fraction, by estimation, is 55 to 60%. The left ventricle has normal function. The left ventricle has no regional wall motion abnormalities. Left ventricular diastolic parameters were normal.  2. Right ventricular systolic function is normal. The right ventricular size is normal.  3. The mitral valve is normal in structure. Trivial mitral valve  regurgitation.  4. The aortic valve was not well visualized. Aortic valve regurgitation is not visualized. FINDINGS  Left Ventricle: Left ventricular ejection fraction, by estimation, is 55 to 60%. The left ventricle has normal function. The left ventricle has no regional wall motion abnormalities. The left ventricular internal cavity size was normal in size. There is  no left ventricular hypertrophy. Left ventricular diastolic parameters were normal. Right Ventricle: The right ventricular size is normal. Right vetricular wall thickness was not well visualized. Right ventricular systolic function is normal. Left Atrium: Left atrial size was normal in size. Right Atrium: Right atrial size was normal in size. Pericardium: There is no evidence of pericardial effusion. Mitral Valve: The mitral valve is normal in structure. Trivial mitral valve regurgitation. Tricuspid Valve: The tricuspid valve is normal in structure. Tricuspid valve regurgitation is trivial. Aortic Valve: The aortic valve was not well visualized. Aortic valve regurgitation is not visualized. Pulmonic Valve: The pulmonic valve was normal in structure. Pulmonic valve regurgitation is not visualized. Aorta: The aortic root and ascending aorta are structurally normal, with no evidence of dilitation. IAS/Shunts: The atrial septum is grossly normal.  LEFT VENTRICLE PLAX 2D LVIDd:         4.70 cm      Diastology LVIDs:         3.70 cm      LV e' medial:    8.85 cm/s LV PW:         0.80 cm      LV E/e' medial:  10.3 LV IVS:        0.80 cm      LV e' lateral:   9.20 cm/s LVOT diam:     1.70 cm      LV E/e' lateral: 9.9 LV SV:         45 LV SV Index:   23 LVOT Area:     2.27 cm  LV Volumes (MOD) LV vol d, MOD A2C: 110.0 ml LV vol d, MOD A4C: 112.0 ml LV vol s, MOD A2C: 45.5 ml LV vol s, MOD A4C: 57.5 ml LV SV MOD A2C:     64.5 ml LV SV MOD A4C:     112.0 ml LV SV MOD BP:      61.2 ml RIGHT VENTRICLE RV S prime:     15.60 cm/s TAPSE (M-mode): 2.0 cm LEFT ATRIUM              Index       RIGHT ATRIUM          Index LA diam:        3.20 cm 1.62 cm/m  RA Area:     9.85 cm LA Vol (A2C):   32.1 ml 16.26 ml/m RA Volume:   20.60 ml  10.43 ml/m LA Vol (A4C):   33.5 ml 16.97 ml/m LA Biplane Vol: 34.6 ml 17.53 ml/m  AORTIC VALVE LVOT Vmax:   88.10 cm/s LVOT Vmean:  60.800 cm/s LVOT VTI:    0.198 m  AORTA Ao Root diam: 2.60 cm MITRAL VALVE MV Area (PHT): 3.08 cm    SHUNTS MV Decel Time: 246 msec    Systemic VTI:  0.20 m MV E velocity: 90.80 cm/s  Systemic Diam: 1.70 cm MV A velocity: 77.10 cm/s MV E/A ratio:  1.18 Kristeen Miss MD Electronically signed by Kristeen Miss MD Signature Date/Time: 02/24/2021/1:55:56 PM    Final    CT HEAD CODE STROKE WO CONTRAST  Result Date: 02/24/2021 CLINICAL DATA:  Code stroke.  75 year old female EXAM: CT HEAD WITHOUT CONTRAST TECHNIQUE: Contiguous axial images were obtained from the base of the skull through the vertex without intravenous contrast. COMPARISON:  Report of [facility] head CT 07/16/2011 (no images available). FINDINGS: Brain: No midline shift, mass effect, or evidence of intracranial mass lesion. No ventriculomegaly. No acute intracranial hemorrhage identified. Cerebral volume is within normal limits for age. Gray-white matter differentiation appears symmetric and within normal limits for age. No evidence of acute or chronic ischemia is identified. Vascular: Calcified atherosclerosis at the skull base. No suspicious intracranial vascular hyperdensity. Left vertebral artery appears dominant. Skull: No acute osseous abnormality identified. Hyperostosis frontalis, normal variant. 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 Tympanic cavities, hyperplastic petrous apex and mastoid air cells are clear. Other: Visualized orbits and scalp soft tissues are within normal limits. ASPECTS [facility] Stroke Program  Early CT Score) Total score (0-10 with 10 being normal): 10 IMPRESSION: 1. Normal for age non contrast CT appearance of the brain. ASPECTS 10. 2. These results were communicated to Dr. Iver Nestle at 5:08 am on 02/24/2021 by text page via the [facility] messaging system. Electronically Signed   By: Odessa Fleming M.D.   On: 02/24/2021 05:08   CT ANGIO HEAD NECK W WO CM (CODE STROKE)  Result Date: 02/24/2021 CLINICAL DATA:  Code stroke. 75 year old female woke with slurred speech, right facial droop, right extremity weakness. EXAM: CT ANGIOGRAPHY HEAD AND NECK TECHNIQUE: Multidetector CT imaging of the head and neck was performed using the standard protocol during bolus administration of intravenous contrast. Multiplanar CT image reconstructions and MIPs were obtained to evaluate the vascular anatomy. Carotid stenosis measurements (when applicable) are obtained utilizing NASCET criteria, using the distal internal carotid diameter as the denominator. CONTRAST:  64mL OMNIPAQUE IOHEXOL 350 MG/ML SOLN COMPARISON:  Plain head CT 0502 hours. FINDINGS: CTA NECK Skeleton: Bulky anterior endplate osteophytes in the cervical and upper thoracic spine resulting in some interbody ankylosis, in keeping with Diffuse idiopathic skeletal hyperostosis (DISH). Occasional dental periapical lucency. Partially visible upper thoracic scoliosis. No acute osseous abnormality identified. Upper chest: Negative upper lungs. Other neck: 2 cm left thyroid nodule Recommend thyroid US (ref: J Am Coll Radiol. 2015 Feb;12(2): 143-50).Partially retropharyngeal course of both carotids, normal variant. Otherwise negative neck soft tissues. Aortic arch: 3 vessel arch configuration. Mild for age arch atherosclerosis. Right carotid system: Tortuous brachiocephalic artery with normal right CCA origin. Mildly tortuous and retropharyngeal right CCA. Negative right carotid bifurcation. Mildly tortuous cervical right ICA. Left carotid system: Mildly tortuous proximal left CCA.  Mild soft plaque at the level of the thyroid. No CCA stenosis. Minimal calcified plaque at the lateral left ICA origin and bulb  without stenosis. Vertebral arteries: Tortuous proximal right subclavian artery without plaque or stenosis. Normal right vertebral artery origin. Somewhat non dominant right vertebral artery with tortuous V2 segment but no plaque or stenosis to the skull base. Mild atherosclerosis in the proximal left subclavian artery without significant stenosis. Calcified plaque at the left vertebral artery origin with moderate origin stenosis (series 9, image 113). But otherwise patent and mildly dominant left vertebral artery to the skull base with no additional plaque. CTA HEAD Posterior circulation: Dominant left V4 segment with calcified plaque but only mild stenosis (series 7, image 132). Tortuous vertebrobasilar junction. Normal PICA origins. Non dominant distal right vertebral artery without stenosis. Patent basilar artery without stenosis. Patent SCA and left PCA origins. Fetal right PCA origin. Diminutive or absent left posterior communicating artery. Proximal PCAs are within normal limits. The bilateral P3 branches are mildly to moderately irregular, more so on the right (series 12, image 18). Anterior circulation: Both ICA siphons are patent. Cavernous and supraclinoid left siphon calcified plaque results in only mild stenosis. Similar right siphon mild calcified plaque without significant stenosis. Normal right posterior communicating artery origin. Patent carotid termini, MCA and ACA origins. A1 segments with diminutive anterior communicating artery. Bilateral ACA branches are within normal limits. Right MCA M1 segment, bifurcation, and right MCA branches are within normal limits. Left MCA M1 segment and by or trifurcation are patent with no stenosis identified. No left MCA branch occlusion is identified. Venous sinuses: Patent. Left transverse and sigmoid sinuses appear dominant. Anatomic  variants: Mildly dominant left vertebral artery. Fetal type right PCA origin. Review of the MIP images confirms the above findings IMPRESSION: 1. Negative for large vessel occlusion. And no circle-of-Willis branch occlusion identified. 2. Generally mild carotid and anterior circulation atherosclerosis. But more pronounced vertebral and posterior circulation atherosclerosis including: - Moderate stenosis at the origin of the dominant left vertebral artery. - mild to moderate stenoses of the distal PCA branches. 3. A 2 cm left thyroid nodule meets size criteria for further characterization. Recommend Thyroid Ultrasound. 4. Diffuse idiopathic skeletal hyperostosis (DISH). Electronically Signed   By: Odessa Fleming M.D.   On: 02/24/2021 05:46     PHYSICAL EXAM  Temp:  [98 F (36.7 C)-98.4 F (36.9 C)] 98.4 F (36.9 C) (07/01 1512) Pulse Rate:  [70-91] 75 (07/01 1512) Resp:  [14-20] 18 (07/01 1512) BP: (116-164)/(54-89) 164/79 (07/01 1512) SpO2:  [99 %-100 %] 100 % (07/01 1512)  General - Well nourished, well developed, in no apparent distress.  Ophthalmologic - fundi not visualized due to noncooperation.  Cardiovascular - Regular rhythm and rate.  Mental Status -  Level of arousal and orientation to time, place, and person were intact. Language including expression, naming, repetition, comprehension was assessed and found intact, mild dysarthria.  Cranial Nerves II - XII - II - Visual field intact OU. III, IV, VI - Extraocular movements intact. V - Facial sensation decreased on the right. VII - Right mild facial droop. VIII - Hearing & vestibular intact bilaterally. X - Palate elevates symmetrically, mild dysarthria. XI - Chin turning & shoulder shrug intact bilaterally. XII - Tongue protrusion intact.  Motor Strength - The patient's strength was normal in left upper and lower extremities.  However, right upper extremity 0/5 with only mild thumb movement, right lower extremity proximal 2/5,  knee extension 3/5, ankle DF 0/5. Bulk was normal and fasciculations were absent.   Motor Tone - Muscle tone was assessed at the neck and appendages and was normal.  Reflexes - The patient's reflexes were symmetrical in all extremities and she had no pathological reflexes.  Sensory - Light touch, temperature/pinprick were assessed and were decreased on the right.    Coordination - The patient had normal movements in the left hand with no ataxia or dysmetria.  Tremor was absent.  Gait and Station - deferred.   ASSESSMENT/PLAN [name] is a 75 y.o. female with history of chronic headache admitted for right-sided weakness, slurred speech and right facial droop. No tPA given due to outside window.    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 CT head and neck left VA origin in the bilateral distal PCA branches stenosis MRI left pontine infarct 2D Echo EF 55 to 60% LDL pending HgbA1c 7.4 Lovenox for VTE prophylaxis No antithrombotic prior to admission, now on aspirin 81 mg daily and clopidogrel 75 mg daily DAPT for 3 weeks and then aspirin alone. Patient counseled to be compliant with her antithrombotic medications Ongoing aggressive stroke risk factor management Therapy recommendations: CIR Disposition: Pending  Diabetes, new diagnosis HgbA1c 7.4 goal < 7.0 Uncontrolled Hyperglycemia CBG monitoring SSI DM education and close PCP follow up  Hypertension Stable Permissive hypertension (OK if <220/120) for 24-48 hours post stroke and then gradually normalized within 2-3 days. Long term BP goal normotensive  Hyperlipidemia Home meds: None LDL pending, goal < 70 Now on Lipitor 40 Continue statin at discharge  Other Stroke Risk Factors Advanced age Obesity Migraines  Other Active Problems   Hospital day # 0  Neurology will sign off. Please call with questions. Pt will follow up with stroke clinic NP at [facility] in  about 4 weeks. Thanks for the consult.   Marvel Plan, MD PhD Stroke Neurology 02/24/2021 3:53 PM    To contact Stroke Continuity provider, please refer to [email or web address]. After hours, contact General Neurology

## 2021-02-24 NOTE — ED Notes (Signed)
Called for a lunch tray by Carrie Kline

## 2021-02-24 NOTE — Progress Notes (Signed)
  Echocardiogram 2D Echocardiogram has been performed.  Stark Bray Swaim 02/24/2021, 10:27 AM

## 2021-02-25 ENCOUNTER — Inpatient Hospital Stay (HOSPITAL_COMMUNITY): Payer: Medicare Other

## 2021-02-25 LAB — GLUCOSE, CAPILLARY
Glucose-Capillary: 146 mg/dL — ABNORMAL HIGH (ref 70–99)
Glucose-Capillary: 116 mg/dL — ABNORMAL HIGH (ref 70–99)

## 2021-02-25 LAB — CBC
HCT: 37.6 % (ref 36.0–46.0)
Hemoglobin: 12 g/dL (ref 12.0–15.0)
MCH: 27.9 pg (ref 26.0–34.0)
MCHC: 31.9 g/dL (ref 30.0–36.0)
MCV: 87.4 fL (ref 80.0–100.0)
Platelets: 201 10*3/uL (ref 150–400)
RBC: 4.3 MIL/uL (ref 3.87–5.11)
RDW: 12.9 % (ref 11.5–15.5)
WBC: 7 10*3/uL (ref 4.0–10.5)
nRBC: 0 % (ref 0.0–0.2)

## 2021-02-25 LAB — BASIC METABOLIC PANEL
Anion gap: 7 (ref 5–15)
BUN: 19 mg/dL (ref 8–23)
CO2: 24 mmol/L (ref 22–32)
Calcium: 9.4 mg/dL (ref 8.9–10.3)
Chloride: 105 mmol/L (ref 98–111)
Creatinine, Ser: 0.99 mg/dL (ref 0.44–1.00)
GFR, Estimated: >60 mL/min (ref >60)
Glucose, Bld: 177 mg/dL — ABNORMAL HIGH (ref 70–99)
Potassium: 3.7 mmol/L (ref 3.5–5.1)
Sodium: 136 mmol/L (ref 135–145)

## 2021-02-25 LAB — T4, FREE: Free T4: 1.04 ng/dL (ref 0.61–1.12)

## 2021-02-25 MED ORDER — INSULIN GLARGINE 100 UNIT/ML ~~LOC~~ SOLN
15.0000 [IU] | Freq: Every day | SUBCUTANEOUS | Status: DC
  Administered 2021-02-25 – 2021-02-28 (×4): 15 [IU] via SUBCUTANEOUS
  Filled 2021-02-25 (×5): qty 0.15

## 2021-02-25 MED ORDER — INSULIN ASPART 100 UNIT/ML IJ SOLN
0.0000 [IU] | Freq: Three times a day (TID) | INTRAMUSCULAR | Status: DC
Start: 2021-02-25 — End: 2021-03-01
  Administered 2021-02-26 (×3): 1 [IU] via SUBCUTANEOUS
  Administered 2021-02-27: 2 [IU] via SUBCUTANEOUS
  Administered 2021-02-27 – 2021-03-01 (×3): 1 [IU] via SUBCUTANEOUS

## 2021-02-25 NOTE — Progress Notes (Signed)
Subjective:  No acute events overnight.  Carrie Kline was seen at bedside this morning.  Patient states that she is doing relatively well today. She states that she feels about the same as yesterday, but is noticed her speech gradually improving.  Objective:  Vital signs in last 24 hours: Vitals:   02/24/21 1512 02/24/21 1928 02/24/21 2343 02/25/21 0348  BP: (!) 164/79 (!) 127/58 136/60 130/78  Pulse: 75 79 79 80  Resp: 18 16 18 18   Temp: 98.4 F (36.9 C) 98.7 F (37.1 C) 98.4 F (36.9 C) 98.3 F (36.8 C)  TempSrc: Oral Oral Oral Oral  SpO2: 100% 97% 100% 99%   Physical Exam Constitutional:      Appearance: She is not toxic-appearing or diaphoretic.  HENT:     Head: Normocephalic.  Eyes:     General:        Right eye: No discharge.        Left eye: No discharge.     Extraocular Movements: Extraocular movements intact.     Conjunctiva/sclera: Conjunctivae normal.     Pupils: Pupils are equal, round, and reactive to light.  Cardiovascular:     Rate and Rhythm: Normal rate and regular rhythm.     Pulses: Normal pulses.     Heart sounds: Normal heart sounds. No murmur heard.   No friction rub. No gallop.  Musculoskeletal:     Comments: LUE and LLE 5/5 strength RUE and RLE 0/5 strength  Neurological:     Mental Status: She is alert and oriented to person, place, and time.     Sensory: Sensory deficit present.     Comments: Mild R side facial droop, dysarthria improved. Sensory deficit to light tough appreciate on the RUE and RLE    CBC Latest Ref Rng & Units 02/25/2021 02/24/2021 02/24/2021  WBC 4.0 - 10.5 K/uL 7.0 - 6.6  Hemoglobin 12.0 - 15.0 g/dL 04/27/2021 18.2 99.3  Hematocrit 36.0 - 46.0 % 37.6 38.0 38.4  Platelets 150 - 400 K/uL 201 - 193   BMP Latest Ref Rng & Units 02/25/2021 02/24/2021 02/24/2021  Glucose 70 - 99 mg/dL 04/27/2021) 967(E) 938(B)  BUN 8 - 23 mg/dL 19 017(P) 23  Creatinine 0.44 - 1.00 mg/dL 10(C 5.85 2.77  Sodium 135 - 145 mmol/L 136 141 137  Potassium 3.5  - 5.1 mmol/L 3.7 4.0 4.0  Chloride 98 - 111 mmol/L 105 107 107  CO2 22 - 32 mmol/L 24 - 24  Calcium 8.9 - 10.3 mg/dL 9.4 - 9.3     Assessment/Plan:  Principal Problem:   Acute cerebral infarction [facility]) Active Problems:   Thyroid nodule   Diabetes (HCC)  Carrie Kline is a 75 year old person with a pertinent history of gout allergic and rhinitis who presented to the ED with right-sided weakness and dysarthria and was admitted for acute left pons infarct.  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  MRI findings showing acute infarction of the left pons likely thrombotic.  Patient has new contributing factors of diabetes with A1c of 7.1.  Lipid panel showing increased LDL of 126, ASCVD risk 29% now on statin.  Mild improvement in dysarthria today.  Physical exam findings show right upper and lower extremity strength of 0/5.  Patient is agreeable and motivated to work with CIR. - Continue aspirin 81 mg daily - Continue clopidogrel 75 mg daily for 3 weeks. -  Continue atorvastatin 40 mg daily - Continue permissive hypertension for approximately 24 hours before medical management. - Continue to work with PT - Pending for bed availability, will discharge to CIR  Newly Diagnosed Diabetes: Recent A1c of 7.1, started on basal insulin and sliding scale. Last CBG was 146. - Sliding scale insulin - Lantus 15 units nightly  Thyroid Nodule: Thyroid ultrasound showing multiple thyroid nodules.  Nodule #3 is solid and is 1.9 by 1.5 cm in size, nodule #5 is a solid nodule located in the left mid thyroid lobe measures up to two-point centimeters, and nodule #4 and #6 measure 1.2 cm and 1.5 cm respectively.  TSH Level found to be 0.014 patient does not appear clinically hyperthyroid, will assess with free T4 - Order free T4 - Nodule #3 outpatient biopsy - Nodule #5 outpatient  ultrasound-guided biopsy - Follow-up nodules #4 and #6 in 1 year  Diffuse idiopathic skeletal hyperostosis: Continue to monitor for symptoms  Prior to Admission Living Arrangement: Home Anticipated Discharge Location: CIR Barriers to Discharge: Bed availability Dispo: Anticipated discharge in approximately 3-4 day(s).   Dolan Amen, MD 02/25/2021, 7:02 AM Pager: [phone] After 5pm on weekdays and 1pm on weekends: On Call pager [phone]

## 2021-02-25 NOTE — Progress Notes (Signed)
Inpatient Rehab Admissions:  Inpatient Rehab Consult received.  I met with patient at the bedside for rehabilitation assessment and to discuss goals and expectations of an inpatient rehab admission.  Pt acknowledged understanding of goals and expectations. Pt interested in pursuing CIR. Pt gave permission to contact, daughter, Randell Patient. Spoke with [place] on the phone. She acknowledged understanding of CIR goals and expectations. She is interested in pt pursuing CIR. Will continue to follow.   Signed: Gayland Curry, [place], [place] Admissions Coordinator [phone]

## 2021-02-25 NOTE — Progress Notes (Signed)
Physical Therapy Treatment Patient Details Name: Carrie Kline MRN: [id number] DOB: [birth year] Today's Date: 02/25/2021    History of Present Illness Pt adm 7/1 with rt sided weakness. MRI showed acute infarct in lt pons. PMH - gout.    PT Comments    Pt agreeable to working with therapy, however becomes tearful stating "I can't walk". Assured pt that therapy will work with her towards that the goal of walking however can not predict eventual outcome. Pt voices understanding. Pt is currently min A for bed mobility and minAx2 for sit>stand transfers using Stedy. Once in recliner, pt demonstrated PROM of R UE using LUE. When PT propping R UE up pt with trace R wrist and elbow activation and with elevation of R UE slight R ankle movement. Pt is very motivated and reports family assistance is available, so continue to recommend CIR level rehab at discharge.    Follow Up Recommendations  CIR     Equipment Recommendations  Wheelchair (measurements PT);Wheelchair cushion (measurements PT)       Precautions / Restrictions Precautions Precautions: Fall Precaution Comments: R hemiparesis Restrictions Weight Bearing Restrictions: No    Mobility  Bed Mobility Overal bed mobility: Needs Assistance Bed Mobility: Sidelying to Sit;Sit to Sidelying   Sidelying to sit: Min assist       General bed mobility comments: assist for R LE management    Transfers Overall transfer level: Needs assistance Equipment used: Ambulation equipment used Transfers: Sit to/from Stand Sit to Stand: Min assist;+2 safety/equipment         General transfer comment: min A for power up and steadying in [place], and for controlled descent to chair   Modified Rankin (Stroke Patients Only) Modified Rankin (Stroke Patients Only) Pre-Morbid Rankin Score: No symptoms Modified Rankin: Severe disability     Balance Overall balance assessment: Needs assistance Sitting-balance support: No upper extremity  supported;Feet unsupported Sitting balance-Leahy Scale: Good     Standing balance support: Bilateral upper extremity supported Standing balance-Leahy Scale: Poor Standing balance comment: requires L UE support and R knee blocking to maintain balance                            Cognition Arousal/Alertness: Awake/alert Behavior During Therapy: WFL for tasks assessed/performed Overall Cognitive Status: Within Functional Limits for tasks assessed                                 General Comments: tearful about not being able to walk      Exercises General Exercises - Upper Extremity Shoulder Flexion: PROM;Right;5 reps Elbow Flexion: PROM;5 reps;Right Elbow Extension: PROM;5 reps;Right Wrist Flexion: PROM;Right;5 reps Wrist Extension: PROM;Right;5 reps Digit Composite Flexion: PROM;Right;5 reps Composite Extension: PROM;Right;5 reps    General Comments General comments (skin integrity, edema, etc.): VSS on RA, educated in stroke recover and concious attempt for movement with PROM from L UE      Pertinent Vitals/Pain Pain Assessment: No/denies pain     PT Goals (current goals can now be found in the care plan section) Acute Rehab PT Goals Patient Stated Goal: go home PT Goal Formulation: With patient Time For Goal Achievement: 03/10/21 Potential to Achieve Goals: Good    Frequency    Min 4X/week      PT Plan Current plan remains appropriate       AM-PAC PT "6 Clicks" Mobility   Outcome Measure  Help needed turning from your back to your side while in a flat bed without using bedrails?: A Little Help needed moving from lying on your back to sitting on the side of a flat bed without using bedrails?: A Little Help needed moving to and from a bed to a chair (including a wheelchair)?: Total Help needed standing up from a chair using your arms (e.g., wheelchair or bedside chair)?: Total Help needed to walk in hospital room?: Total Help needed  climbing 3-5 steps with a railing? : Total 6 Click Score: 10    End of Session   Activity Tolerance: Patient tolerated treatment well Patient left: in bed;with call bell/phone within reach;with family/visitor present Nurse Communication: Mobility status PT Visit Diagnosis: Hemiplegia and hemiparesis;Other abnormalities of gait and mobility (R26.89) Hemiplegia - Right/Left: Right Hemiplegia - dominant/non-dominant: Non-dominant Hemiplegia - caused by: Cerebral infarction     Time: 1157-2620 PT Time Calculation (min) (ACUTE ONLY): 28 min  Charges:  $Therapeutic Exercise: 8-22 mins $Therapeutic Activity: 8-22 mins                     [name] B. Beverely Risen PT, DPT Acute Rehabilitation Services Pager [phone] Office [phone]    [name] Fleet 02/25/2021, 12:20 PM

## 2021-02-25 NOTE — Plan of Care (Signed)
  Problem: Education: Goal: Knowledge of General Education information will improve Description: Including pain rating scale, medication(s)/side effects and non-pharmacologic comfort measures Outcome: Progressing   Problem: Health Behavior/Discharge Planning: Goal: Ability to manage health-related needs will improve Outcome: Progressing   Problem: Clinical Measurements: Goal: Ability to maintain clinical measurements within normal limits will improve Outcome: Progressing Goal: Will remain free from infection Outcome: Progressing Goal: Diagnostic test results will improve Outcome: Progressing Goal: Respiratory complications will improve Outcome: Progressing Goal: Cardiovascular complication will be avoided Outcome: Progressing   Problem: Activity: Goal: Risk for activity intolerance will decrease Outcome: Progressing   Problem: Elimination: Goal: Will not experience complications related to bowel motility Outcome: Progressing Goal: Will not experience complications related to urinary retention Outcome: Progressing   Problem: Pain Managment: Goal: General experience of comfort will improve Outcome: Progressing   Problem: Safety: Goal: Ability to remain free from injury will improve Outcome: Progressing   Problem: Health Behavior/Discharge Planning: Goal: Ability to manage health-related needs will improve Outcome: Progressing   Problem: Ischemic Stroke/TIA Tissue Perfusion: Goal: Complications of ischemic stroke/TIA will be minimized Outcome: Progressing

## 2021-02-26 LAB — GLUCOSE, CAPILLARY
Glucose-Capillary: 141 mg/dL — ABNORMAL HIGH (ref 70–99)
Glucose-Capillary: 128 mg/dL — ABNORMAL HIGH (ref 70–99)
Glucose-Capillary: 121 mg/dL — ABNORMAL HIGH (ref 70–99)
Glucose-Capillary: 129 mg/dL — ABNORMAL HIGH (ref 70–99)

## 2021-02-26 MED ORDER — LIVING WELL WITH DIABETES BOOK
Freq: Once | Status: DC
  Filled 2021-02-26: qty 1

## 2021-02-26 MED ORDER — LISINOPRIL 5 MG PO TABS
5.0000 mg | ORAL_TABLET | Freq: Every day | ORAL | Status: DC
  Administered 2021-02-26 – 2021-03-01 (×4): 5 mg via ORAL
  Filled 2021-02-26 (×4): qty 1

## 2021-02-26 NOTE — Progress Notes (Addendum)
Subjective:  O/N Events: No acute events overnight  [name] was seen and evaluated bedside today.  She states that she is doing well today, and is excited to watch her church service through her phone this morning.  She states that she is getting some feeling back in her right upper and right lower extremities, and is able to move her thigh more than yesterday.  She states that she is ready to work with PT to improve her strength and get back to baseline.  Denies new weakness, dysarthria, nausea, vomiting, diarrhea, or constipation.  Objective:  Vital signs in last 24 hours: Vitals:   02/25/21 1207 02/25/21 1817 02/26/21 0031 02/26/21 0521  BP: (!) 144/77 128/75 (!) 146/80 (!) 158/70  Pulse: 76 78 79 67  Resp: 16 16 18 18   Temp: 98.6 F (37 C) 98.7 F (37.1 C) 98.4 F (36.9 C) 98.2 F (36.8 C)  TempSrc: Oral Oral Oral Oral  SpO2: 100% 95% 97% 98%   Physical Exam Constitutional:      Appearance: Normal appearance.  Eyes:     Conjunctiva/sclera: Conjunctivae normal.     Pupils: Pupils are equal, round, and reactive to light.  Cardiovascular:     Pulses: Normal pulses.     Heart sounds: Normal heart sounds.  Pulmonary:     Effort: Pulmonary effort is normal. No respiratory distress.  Neurological:     Mental Status: She is alert and oriented to person, place, and time.     Comments: RUE and RLE 0/5. Light touch improving in the RLE and RUE       CBC Latest Ref Rng & Units 02/25/2021 02/24/2021 02/24/2021  WBC 4.0 - 10.5 K/uL 7.0 - 6.6  Hemoglobin 12.0 - 15.0 g/dL 04/27/2021 14.4 81.8  Hematocrit 36.0 - 46.0 % 37.6 38.0 38.4  Platelets 150 - 400 K/uL 201 - 193   BMP Latest Ref Rng & Units 02/25/2021 02/24/2021 02/24/2021  Glucose 70 - 99 mg/dL 04/27/2021) 149(F) 026(V)  BUN 8 - 23 mg/dL 19 785(Y) 23  Creatinine 0.44 - 1.00 mg/dL 85(O 2.77 4.12  Sodium 135 - 145 mmol/L 136 141 137  Potassium 3.5 - 5.1 mmol/L 3.7 4.0 4.0  Chloride 98 - 111 mmol/L 105 107 107  CO2 22 - 32 mmol/L 24 -  24  Calcium 8.9 - 10.3 mg/dL 9.4 - 9.3     Assessment/Plan:  Principal Problem:   Acute cerebral infarction [facility]) Active Problems:   Thyroid nodule   Diabetes (HCC)  Carrie Kline is a 75 year old person with a pertinent history of gout allergic and rhinitis who presented to the ED with right-sided weakness and dysarthria and was admitted for acute left pons infarct.  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  MRI findings showing acute infarction of the left pons likely thrombotic. Physical exam findings show right upper and lower extremity strength of 0/5. Paresthesias improving. - Continue aspirin 81 mg daily - Continue clopidogrel 75 mg daily for 3 weeks. - Continue atorvastatin 40 mg daily - Continue to work with PT - Pending for bed availability, will discharge to CIR  Newly Diagnosed Diabetes: A1c of 7.1, Last CBG of 128 - Sliding scale insulin - Lantus 15 units nightly  Hyperlipidemia:  Lipid panel showing a total cholesterol 177 with an LDL of 121 ASCVD risk of 29%. - Continue Lipitor 40 mg daily  Hypertension: Blood pressures  with systolics in the 140s to 160s, now outside of permissive hypertensive window. - Lisinopril 5 mg daily  Thyroid Nodule: TSH 0.014 with fT4 of 1.04 consistent with subclinical hypothyroidism.  We will repeat in 6 weeks time.  - Free T4: 1.04 - Nodule #3 outpatient biopsy - Nodule #5 outpatient ultrasound-guided biopsy - Follow-up nodules #4 and #6 in 1 year  Diffuse idiopathic skeletal hyperostosis: Continue to monitor for symptoms  Prior to Admission Living Arrangement: Home Anticipated Discharge Location: CIR Barriers to Discharge: Medically stable for discharge pending bed availability. Dispo: Anticipated discharge in approximately 3-4 day(s).   Dolan Amen, MD 02/26/2021, 6:01 AM Pager: [phone] After 5pm on  weekdays and 1pm on weekends: On Call pager [phone]

## 2021-02-26 NOTE — Progress Notes (Signed)
Spoke with patient on the phone about the new diagnosis of diabetes. States that she does drink sweet drinks such as soda and juices. States that she eats fried chicken frequently. Does not eat bread, rice, or potatoes daily.   Discussed hgbA1C of 7.4% and that blood sugars have probably been running around 160-170 mg/dl on an average. Discussed normal blood sugar. We discussed checking blood sugars with blood glucose meter and strips. Patient has an appointment with a new PCP soon connected to [facility]. Patient is interested in getting a Freestyle Libre continuous glucose monitor for checking blood sugars. This can be ordered by PCP after discharge.   Spoke with daughter, [name], on the phone about her mother and her new diagnosis of diabetes.(Patient requested me to talk with her daughter) Daughter states that her mother will be coming home with her at discharge. States that her father had diabetes and was on insulin and medication for it, so she is familiar with caring for someone with diabetes. Discussed foods that her mother eats. Daughter familiar with diet needs and changes that need to be made.   Ordered Living Well with Diabetes booklet for patient. Noted that patient will be going to inpatient rehab next week.   Will continue to monitor blood sugars while in the hospital.  Smith Mince RN BSN CDE Diabetes Coordinator Pager: [phone]  8am-5pm

## 2021-02-26 NOTE — PMR Pre-admission (Signed)
PMR Admission Coordinator Pre-Admission Assessment  Patient: Carrie Kline is an 75 y.o., female MRN: [id number] DOB: [birth year] Height:   Weight:    Insurance Information HMO: yes    PPO:      PCP:      IPA:      80/20:      OTHER:  PRIMARY: BCBS Medicare Policy#: [id number]      Subscriber: patient CM Name:       Phone#:      Fax#:  Pre-Cert#: [id number]     Received a call from [facility] 7/6 that Pt. Is approved 7/6 for 7 days   Employer:  Phone: [phone] Eff Date: 08/27/2020 - still active Deductible: $0 OOP Max: $4,200 ($90 met) CIR: $335/admission co-pay SNF: $188/day copay, limited to 100 days/cal yr Outpatient:  $40 copay Home Health:  100% coverage DME: 80% coverage, 20% co-insurance Providers: in-network  SECONDARY:       Policy#:      Phone#:   Development worker, community:       Phone#:   The Engineer, petroleum" for patients in Inpatient Rehabilitation Facilities with attached "Privacy Act [place] Records" was provided and verbally reviewed with: Patient  Emergency Contact Information Contact Information     Name Relation Home Work Mobile   [employer] Daughter [phone]         Current Medical History  Patient Admitting Diagnosis: Left Pons Infarct History of Present Illness: Pt is a 75 year old female with medical hx significant for: gout and seasonal allergies. On 02/24/21, pt woke up to use the bathroom and fell getting out of bed d/t right leg not working well. EMS activated but weakness attributed to sleeping position and at that time she had minimal difficulty with her arms or speech.  Her symptoms worsened; pt called daughter to inform her she couldn't move her right side at all and her speech was slurred. When daughter arrived she noted pt had right-sided facial droop. EMS activated again and pt presented to hospital as code stroke. tPA not administered. CT head revealed no acute findings. CT angiogram  of head and neck had incidental finding of 2 cm thyroid nodule but no critical stenosis. MRI of brain revealed acute infarct in left pons.  Pt also received new diagnosis of diabetes. Therapy evaluations completed and CIR recommended d/t pt's deficits in mobility and ability to completed ADLs independently.   Complete NIHSS TOTAL: 0  Patient's medical [facility] has been reviewed by the rehabilitation admission coordinator and physician.  Past Medical History  Past Medical History:  Diagnosis Date   Gout    Seasonal allergies     Family History   family history includes Diabetes in her sister; Heart attack in her father; Thyroid disease in her daughter.  Prior Rehab/Hospitalizations Has the patient had prior rehab or hospitalizations prior to admission? No  Has the patient had major surgery during 100 days prior to admission? No   Current Medications  Current Facility-Administered Medications:     stroke: mapping our early stages of recovery book, , Does not apply, Once, Bhagat, Srishti L, MD   acetaminophen (TYLENOL) tablet 650 mg, 650 mg, Oral, Q4H PRN, 650 mg at 02/25/21 1456 **OR** acetaminophen (TYLENOL) 160 MG/5ML solution 650 mg, 650 mg, Per Tube, Q4H PRN **OR** acetaminophen (TYLENOL) suppository 650 mg, 650 mg, Rectal, Q4H PRN, Marianna Payment, MD   aspirin EC tablet 81 mg, 81 mg, Oral, Daily, Bhagat, Srishti L, MD, 81  mg at 02/26/21 1115   atorvastatin (LIPITOR) tablet 40 mg, 40 mg, Oral, Daily, Marianna Payment, MD, 40 mg at 02/26/21 1116   [COMPLETED] clopidogrel (PLAVIX) tablet 300 mg, 300 mg, Oral, Once, 300 mg at 02/24/21 0615 **FOLLOWED BY** clopidogrel (PLAVIX) tablet 75 mg, 75 mg, Oral, Daily, Bhagat, Srishti L, MD, 75 mg at 02/26/21 1115   enoxaparin (LOVENOX) injection 40 mg, 40 mg, Subcutaneous, Q24H, Marianna Payment, MD, 40 mg at 02/26/21 1117   insulin aspart (novoLOG) injection 0-9 Units, 0-9 Units, Subcutaneous, TID WC, [name], MD, 1 Units  at 02/26/21 1117   insulin glargine (LANTUS) injection 15 Units, 15 Units, Subcutaneous, QHS, [name], MD, 15 Units at 02/25/21 2144   lisinopril (ZESTRIL) tablet 5 mg, 5 mg, Oral, Daily, [name], MD   living well with diabetes book MISC, , Does not apply, Once, Axel Filler, MD   senna-docusate (Senokot-S) tablet 1-2 tablet, 1-2 tablet, Oral, QHS PRN, Marianna Payment, MD  Patients Current Diet:  Diet Order             Diet Carb Modified Fluid consistency: Thin; Room service appropriate? Yes  Diet effective now                   Precautions / Restrictions Precautions Precautions: Fall Precaution Comments: R hemiparesis Restrictions Weight Bearing Restrictions: No   Has the patient had 2 or more falls or a fall with injury in the past year? No  Prior Activity Level Community (5-7x/wk): working; gets out of house daily  Prior Functional Level Self Care: Did the patient need help bathing, dressing, using the toilet or eating? Independent  Indoor Mobility: Did the patient need assistance with walking from room to room (with or without device)? Independent  Stairs: Did the patient need assistance with internal or external stairs (with or without device)? Independent  Functional Cognition: Did the patient need help planning regular tasks such as shopping or remembering to take medications? Independent  Home Assistive Devices / Equipment Home Assistive Devices/Equipment: None Home Equipment: None  Prior Device Use: Indicate devices/aids used by the patient prior to current illness, exacerbation or injury? None of the above  Current Functional Level Cognition  Arousal/Alertness: Awake/alert Overall Cognitive Status: Within Functional Limits for tasks assessed Orientation Level: Oriented X4 General Comments: tearful about not being able to walk Attention: Selective Selective Attention: Appears intact Memory: Appears intact Awareness: Appears  intact    Extremity Assessment (includes Sensation/Coordination)  Upper Extremity Assessment: RUE deficits/detail RUE Deficits / Details: flaccid - slight thumb AROM noted.  Able to move through PROM RUE Sensation: WNL RUE Coordination: decreased fine motor, decreased gross motor  Lower Extremity Assessment: Defer to PT evaluation RLE Deficits / Details: Hip/knee with trace. Ankle 0/5    ADLs  Overall ADL's : Needs assistance/impaired Eating/Feeding: Set up, Bed level Grooming: Wash/dry hands, Wash/dry face, Moderate assistance, Bed level Upper Body Dressing : Moderate assistance, Sitting Lower Body Dressing: Maximal assistance, Sitting/lateral leans    Mobility  Overal bed mobility: Needs Assistance Bed Mobility: Sidelying to Sit, Sit to Sidelying Sidelying to sit: Min assist Sit to sidelying: Min assist General bed mobility comments: assist for R LE management    Transfers  Overall transfer level: Needs assistance Equipment used: Ambulation equipment used Transfer via Lift Equipment: Stedy Transfers: Sit to/from [employer] to Stand: Min assist, +2 safety/equipment General transfer comment: min A for power up and steadying in [place], and for controlled descent to chair  Ambulation / Gait / Stairs / Office manager / Balance Balance Overall balance assessment: Needs assistance Sitting-balance support: No upper extremity supported, Feet unsupported Sitting balance-Leahy Scale: Good Standing balance support: Bilateral upper extremity supported Standing balance-Leahy Scale: Poor Standing balance comment: requires L UE support and R knee blocking to maintain balance    Special needs/care consideration Diabetic management novoLOG: 0-9 units 3 times daily; Lantus: 15 units daily at bedtim, External Urinary Catheter and Designated visitor Hendricks Limes, daughter   Previous Home Environment (from acute therapy documentation) Living Arrangements: Alone   Lives With: Son Available Help at Discharge: Family, Available 24 hours/day Type of Home: House Home Layout: Two level, 1/2 bath on main level Alternate Level Stairs-Rails: Right Alternate Level Stairs-Number of Steps: 12 Home Access: Level entry Entrance Stairs-Number of Steps: 1 Bathroom Shower/Tub: Chiropodist: Standard Bathroom Accessibility: Yes How Accessible: Accessible via walker Home Care Services: No Additional Comments: Pt plans to go home with daughter to the above house at [place]  Discharge Living Setting Plans for Discharge Living Setting: House (daughter's house) Type of Home at Discharge: House Discharge Home Layout: Two level, 1/2 bath on main level Alternate Level Stairs-Rails: Left Alternate Level Stairs-Number of Steps: 12-13 Discharge Home Access: Level entry Discharge Bathroom Shower/Tub: Tub/shower unit Discharge Bathroom Toilet: Standard Discharge Bathroom Accessibility: Yes How Accessible: Accessible via walker Does the patient have any problems obtaining your medications?: No  Social/Family/Support Systems Anticipated Caregiver: Hendricks Limes and son Anticipated Caregiver's Contact Information: [phone] Caregiver Availability: 24/7 Discharge Plan Discussed with Primary Caregiver: Yes Is Caregiver In Agreement with Plan?: Yes Does Caregiver/Family have Issues with Lodging/Transportation while Pt is in Rehab?: No  Goals Patient/Family Goal for Rehab: Supervision: PT/OT Expected length of stay: 14-21 days Pt/Family Agrees to Admission and willing to participate: Yes Program Orientation Provided & Reviewed with Pt/Caregiver Including Roles  & Responsibilities: Yes  Decrease burden of Care through IP rehab admission: NA  Possible need for SNF placement upon discharge: Not anticipated  Patient Condition: I have reviewed medical records from [facility], spoken with CM, and patient. I met with patient at the  bedside for inpatient rehabilitation assessment.  Patient will benefit from ongoing PT and OT, can actively participate in 3 hours of therapy a day 5 days of the week, and can make measurable gains during the admission.  Patient will also benefit from the coordinated team approach during an Inpatient Acute Rehabilitation admission.  The patient will receive intensive therapy as well as Rehabilitation physician, nursing, social worker, and care management interventions.  Due to safety, skin/wound care, disease management, medication administration, pain management, and patient education the patient requires 24 hour a day rehabilitation nursing.  The patient is currently min A with mobility and basic ADLs.  Discharge setting and therapy post discharge at home with home health is anticipated.  Patient has agreed to participate in the Acute Inpatient Rehabilitation Program and will admit today.  Preadmission Screen Completed By:  Bethel Born, 02/26/2021 3:40 PM with updates by [facility]  ______________________________________________________________________   Discussed status with Dr. Bonney Aid at 1130 on 03/01/21 and received approval for admission today.  Admission Coordinator:  Bethel Born, [place], time 2297 /Date 03/01/21   Assessment/Plan: Diagnosis:  Left pontine infarct Does the need for close, 24 hr/day Medical supervision in concert with the patient's rehab needs make it unreasonable for this patient to be served in a less  intensive setting? Yes Co-Morbidities requiring supervision/potential complications: Right hemiparesis, HTN, new onset DM, hx gout Due to bladder management, bowel management, safety, skin/wound care, disease management, medication administration, pain management, and patient education, does the patient require 24 hr/day rehab nursing? Yes Does the patient require coordinated care of a physician, rehab nurse, PT, OT, and SLP to address physical and  functional deficits in the context of the above medical diagnosis(es)? Yes Addressing deficits in the following areas: balance, endurance, locomotion, strength, transferring, bowel/bladder control, bathing, dressing, feeding, grooming, toileting, cognition, and psychosocial support Can the patient actively participate in an intensive therapy program of at least 3 hrs of therapy 5 days a week? Yes The potential for patient to make measurable gains while on inpatient rehab is good Anticipated functional outcomes upon discharge from inpatient rehab: supervision and min assist PT, supervision and min assist OT, n/a SLP Estimated rehab length of stay to reach the above functional goals is: 2-3wks Anticipated discharge destination: Home 10. Overall Rehab/Functional Prognosis: excellent   MD Signature: Charlett Blake M.D. [place] [facility] Fellow Am Acad of Phys Med and Rehab Diplomate Am Board of Electrodiagnostic Med Fellow Am Board of Interventional Pain

## 2021-02-26 NOTE — Evaluation (Signed)
Speech Language Pathology Evaluation Patient Details Name: Carrie Kline MRN: [id number] DOB: [birth year] Today's Date: 02/26/2021 Time: 1513-     Problem List:  Patient Active Problem List   Diagnosis Date Noted   Acute cerebral infarction (HCC) 02/24/2021   Thyroid nodule 02/24/2021   Diabetes (HCC) 02/24/2021   Past Medical History:  Past Medical History:  Diagnosis Date   Gout    Seasonal allergies    Past Surgical History:  Past Surgical History:  Procedure Laterality Date   ABDOMINAL HYSTERECTOMY     BACK SURGERY     GANGLION CYST EXCISION     HPI:  75 y.o. female admitted 7/1 with R sided weakness, mild dysarthria. 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 PMH - gout.   Assessment / Plan / Recommendation Clinical Impression  Pt presents with normal expressive and receptive language.  No further dysarthria is detectable (pt scores herself at a 9.5 out of 10 baseline). Speech is clear and fluent.  Memory, problem-solving, orientation are WNL.  She reports no dysphagia and passed RN stroke swallow screen.  There is mild left asymmetry lower right face. No SLP f/u is needed- Pt is [facility] for all subtests.    SLP Assessment  SLP Recommendation/Assessment: Patient does not need any further Speech Lanaguage Pathology Services SLP Visit Diagnosis: Cognitive communication deficit (R41.841)    Follow Up Recommendations  None    Frequency and Duration   N/a        SLP Evaluation Cognition  Overall Cognitive Status: Within Functional Limits for tasks assessed Arousal/Alertness: Awake/alert Orientation Level: Oriented X4 Attention: Selective Selective Attention: Appears intact Memory: Appears intact Awareness: Appears intact       Comprehension  Auditory Comprehension Overall Auditory Comprehension: Appears within functional limits for tasks assessed Yes/No Questions: Within Functional Limits Commands: Within Functional Limits Visual  Recognition/Discrimination Discrimination: Within Function Limits Reading Comprehension Reading Status: Not tested    Expression Expression Primary Mode of Expression: Verbal Verbal Expression Overall Verbal Expression: Appears within functional limits for tasks assessed Level of Generative/Spontaneous Verbalization: Conversation Naming: No impairment   Oral / Motor  Oral Motor/Sensory Function Overall Oral Motor/Sensory Function: Mild impairment Facial Symmetry: Abnormal symmetry right;Suspected CN VII (facial) dysfunction Motor Speech Overall Motor Speech: Appears within functional limits for tasks assessed   GO                    Blenda Mounts Laurice 02/26/2021, 3:32 PM Marchelle Folks L. Samson Frederic, MA CCC/SLP Acute Rehabilitation Services Office number [phone] Pager [phone]

## 2021-02-27 LAB — GLUCOSE, CAPILLARY
Glucose-Capillary: 156 mg/dL — ABNORMAL HIGH (ref 70–99)
Glucose-Capillary: 131 mg/dL — ABNORMAL HIGH (ref 70–99)
Glucose-Capillary: 89 mg/dL (ref 70–99)
Glucose-Capillary: 135 mg/dL — ABNORMAL HIGH (ref 70–99)

## 2021-02-27 NOTE — Plan of Care (Signed)
  RD consulted for nutrition education regarding diabetes.   Lab Results  Component Value Date   HGBA1C 7.4 (H) 02/24/2021    RD provided "Carbohydrate Counting for People with Diabetes" handout from the Academy of Nutrition and Dietetics. Discussed different food groups and their effects on blood sugar, emphasizing carbohydrate-containing foods. Provided list of carbohydrates and recommended serving sizes of common foods.  Discussed importance of controlled and consistent carbohydrate intake throughout the day. Provided examples of ways to balance meals/snacks and encouraged intake of high-fiber, whole grain complex carbohydrates. Teach back method used.  Patient endorses eating two meals daily that consist of B- sausage, egg, bacon biscuit with fruit and grits D- chicken with beets and collards. Drinks regular soda and water. Discussed the importance of eating three meals daily, how to make meals balanced, which foods contain carbohydrates, how to read a food label, and how to make appropriate beverage substitutions. Patient plans to d/c home with daughter after rehab. Answered all questions.   Current diet order is carb modified, patient is consuming approximately 100% of meals at this time. Labs and medications reviewed. No further nutrition interventions warranted at this time. RD contact information provided. If additional nutrition issues arise, please re-consult RD.  Vanessa Kick MS, RD, LDN, CNSC Clinical Nutrition Pager listed in AMION

## 2021-02-27 NOTE — Progress Notes (Signed)
Inpatient Rehab Admissions Coordinator:  Saw pt and daughter, [name]. Pt was a sleep so informed daughter that awaiting insurance approval.  Will continue to follow.   Wolfgang Phoenix, MS, CCC-SLP Admissions Coordinator [phone]

## 2021-02-27 NOTE — Progress Notes (Signed)
Physical Therapy Treatment Patient Details Name: Carrie Kline MRN: [id number] DOB: [birth year] Today's Date: 02/27/2021    History of Present Illness Pt adm 7/1 with rt sided weakness. MRI showed acute infarct in lt pons. PMH - gout.    PT Comments    Pt supine in bed asleep, daughter in room. Pt rouses easily and is agreeable to working with therapy. Pt si making good progress towards her goals. She is excited about return of some movement in her R side, however continues to have severe R sided weakness limiting safe mobility. Pt is min A for coming to EoB, min Ax2 for coming to standing in [place] and at [facility]. Once in standing able to perform marching in place with min-modAx2 for steadying. Pt demonstrates good AAROM of RUE and notes increased thumb and finger movement. Pt has very good rehab potential and PT continues to recommend CIR placement for maximal recovery. PT will continue to follow acutely.    Follow Up Recommendations  CIR     Equipment Recommendations  Wheelchair (measurements PT);Wheelchair cushion (measurements PT)       Precautions / Restrictions Precautions Precautions: Fall Precaution Comments: R hemiparesis Restrictions Weight Bearing Restrictions: No    Mobility  Bed Mobility Overal bed mobility: Needs Assistance Bed Mobility: Sidelying to Sit;Sit to Sidelying   Sidelying to sit: Min assist       General bed mobility comments: assist for R LE management    Transfers Overall transfer level: Needs assistance Equipment used: Ambulation equipment used;Rolling walker (2 wheeled) Transfers: Sit to/from Stand Sit to Stand: Min assist;+2 safety/equipment         General transfer comment: min A for power up and steadying in [place], and for controlled descent to chair, min Ax2 for power up and steadying with RW to perform marching in place     Modified Rankin (Stroke Patients Only) Modified Rankin (Stroke Patients Only) Pre-Morbid Rankin Score: No  symptoms Modified Rankin: Severe disability     Balance Overall balance assessment: Needs assistance Sitting-balance support: No upper extremity supported;Feet unsupported Sitting balance-Leahy Scale: Good     Standing balance support: Bilateral upper extremity supported Standing balance-Leahy Scale: Poor Standing balance comment: requires L UE support and R knee blocking to maintain balance                            Cognition Arousal/Alertness: Awake/alert Behavior During Therapy: WFL for tasks assessed/performed Overall Cognitive Status: Within Functional Limits for tasks assessed                                 General Comments: excited about more ability to move her extremities      Exercises General Exercises - Upper Extremity Shoulder Flexion: Right;5 reps;AAROM Elbow Flexion: 5 reps;Right;AAROM Elbow Extension: 5 reps;Right;AAROM Wrist Flexion: Right;5 reps;AAROM Wrist Extension: Right;5 reps;AAROM Digit Composite Flexion: Right;5 reps;AAROM Composite Extension: Right;5 reps;AAROM General Exercises - Lower Extremity Hip Flexion/Marching: AROM;Both;10 reps;Standing    General Comments General comments (skin integrity, edema, etc.): VSS on RA      Pertinent Vitals/Pain Pain Assessment: No/denies pain     PT Goals (current goals can now be found in the care plan section) Acute Rehab PT Goals Patient Stated Goal: go home PT Goal Formulation: With patient Time For Goal Achievement: 03/10/21 Potential to Achieve Goals: Good Progress towards PT goals: Progressing toward goals  Frequency    Min 4X/week      PT Plan Current plan remains appropriate       AM-PAC PT "6 Clicks" Mobility   Outcome Measure  Help needed turning from your back to your side while in a flat bed without using bedrails?: A Little Help needed moving from lying on your back to sitting on the side of a flat bed without using bedrails?: A Little Help  needed moving to and from a bed to a chair (including a wheelchair)?: Total Help needed standing up from a chair using your arms (e.g., wheelchair or bedside chair)?: Total Help needed to walk in hospital room?: Total Help needed climbing 3-5 steps with a railing? : Total 6 Click Score: 10    End of Session Equipment Utilized During Treatment: Gait belt Activity Tolerance: Patient tolerated treatment well Patient left: in bed;with call bell/phone within reach;with family/visitor present Nurse Communication: Mobility status PT Visit Diagnosis: Hemiplegia and hemiparesis;Other abnormalities of gait and mobility (R26.89) Hemiplegia - Right/Left: Right Hemiplegia - dominant/non-dominant: Non-dominant Hemiplegia - caused by: Cerebral infarction     Time: 3291-9166 PT Time Calculation (min) (ACUTE ONLY): 23 min  Charges:  $Therapeutic Exercise: 8-22 mins $Therapeutic Activity: 8-22 mins                     Zamiah Tollett B. Beverely Risen PT, DPT Acute Rehabilitation Services Pager [phone] Office [phone]    [name] [facility]old [facility] 02/27/2021, 9:37 AM

## 2021-02-27 NOTE — Progress Notes (Signed)
Subjective:  Carrie Kline says that she is doing well this morning. Says that she is feeling a bit better and feels like she is doing well working with PT. She says that she is determined to walk and that she is going to work hard to get there. Curious about what caused the stroke.   Objective:    Vital Signs (last 24 hours): Vitals:   02/26/21 1743 02/26/21 1955 02/27/21 0000 02/27/21 0407  BP: 131/67 (!) 143/74 126/63 137/73  Pulse: 83 79 74 67  Resp:  19 16 18   Temp: 98.7 F (37.1 C) 98.2 F (36.8 C) 97.8 F (36.6 C) 98 F (36.7 C)  TempSrc: Oral Oral Oral Oral  SpO2: 100% 97% 94% 100%     @MYPHYSICALEXAM @ Physical Exam Constitutional:      Appearance: She is obese.  HENT:     Head: Atraumatic.  Eyes:     Conjunctiva/sclera: Conjunctivae normal.  Cardiovascular:     Rate and Rhythm: Normal rate and regular rhythm.  Pulmonary:     Effort: Pulmonary effort is normal.  Abdominal:     Palpations: Abdomen is soft.     Tenderness: There is no abdominal tenderness.     Comments: nontender  Musculoskeletal:     Cervical back: Neck supple.     Comments: Strength 0/5 on the right upper and lower extremities  Neurological:     Mental Status: She is alert and oriented to person, place, and time.     Cranial Nerves: Facial asymmetry present.     Sensory: Sensory deficit present.     Motor: Weakness present.     Comments: Decreased sensation on the right       Assessment/Plan:   Principal Problem:   Acute cerebral infarction [facility]) Active Problems:   Thyroid nodule   Diabetes (HCC)  Carrie Kline is a 75 year old person with a pertinent history of gout allergic and rhinitis who presented to the ED with right-sided weakness and dysarthria and was admitted for acute left pons infarct.  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  MRI findings showing  acute infarction of the left pons likely thrombotic. Physical exam findings show right upper and lower extremity strength of 0/5. Paresthesias improving. - Continue aspirin 81 mg daily - Continue clopidogrel 75 mg daily for 3 weeks, started 7/1 - Continue atorvastatin 40 mg daily - Continue to work with PT - Pending for bed availability, will discharge to CIR   Newly Diagnosed Diabetes: A1c of 7.1, Last CBG of 156 - Sliding scale insulin - Lantus 15 units nightly   Hyperlipidemia:  Lipid panel showing a total cholesterol 177 with an LDL of 121 ASCVD risk of 29%. - Continue Lipitor 40 mg daily   Hypertension: Blood pressures with systolics in the 130s to 150s, now outside of permissive hypertensive window. - Lisinopril 5 mg daily  Thyroid Nodule: TSH 0.014 with fT4 of 1.04 consistent with subclinical hypothyroidism.  We will repeat in 6 weeks time. - Free T4: 1.04 - Nodule #3 outpatient biopsy - Nodule #5 outpatient ultrasound-guided biopsy - Follow-up nodules #4 and #6 in 1 year   Diffuse idiopathic skeletal hyperostosis: Continue to monitor for symptoms   Prior to Admission Living Arrangement: Home Anticipated Discharge Location: CIR Barriers to Discharge: Medically stable for discharge pending bed availability. Dispo: Anticipated discharge in approximately 2-3 day(s).   66, MD Internal Medicine  Resident PGY-1 Pager [phone] 02/27/2021, 10:53 AM

## 2021-02-27 NOTE — Discharge Instructions (Addendum)
Carrie Kline You were seen at [facility] for a stroke. You have been started on several medication to help reduce your risk of developing further strokes and will continue to work with physical therapy to continue to get stronger and work on your mobility. You were also found to have elevated sugars and were diagnosed with diabetes. You have been started on medication to help control your sugars. Your new medications are below.  Metformin 500 mg once daily with breakfast to control your sugars Atorvastatin (lipitor) 40 mg daily to help lower your cholesterol Clopidogrel 75 mg once daily for three weeks (stop on 7/22) Aspirin 81 mg daily   We recommend that you schedule a hospital follow up appointment with your primary care provider in one week.   Carbohydrate Counting For People With Diabetes  Foods with carbohydrates make your blood glucose level go up. Learning how to count carbohydrates can help you control your blood glucose levels. First, identify the foods you eat that contain carbohydrates. Then, using the Foods with Carbohydrates chart, determine about how much carbohydrates are in your meals and snacks. Make sure you are eating foods with fiber, protein, and healthy fat along with your carbohydrate foods. Foods with Carbohydrates The following table shows carbohydrate foods that have about 15 grams of carbohydrate each. Using measuring cups, spoons, or a food scale when you first begin learning about carbohydrate counting can help you learn about the portion sizes you typically eat. The following foods have 15 grams carbohydrate each:  Grains 1 slice bread (1 ounce)  1 small tortilla (6-inch size)   large bagel (1 ounce)  1/3 cup pasta or rice (cooked)   hamburger or hot dog bun ( ounce)   cup cooked cereal   to  cup ready-to-eat cereal  2 taco shells (5-inch size) Fruit 1 small fresh fruit ( to 1 cup)   medium banana  17 small grapes (3 ounces)  1 cup melon or  berries   cup canned or frozen fruit  2 tablespoons dried fruit (blueberries, cherries, cranberries, raisins)   cup unsweetened fruit juice  Starchy Vegetables  cup cooked beans, peas, corn, potatoes/sweet potatoes   large baked potato (3 ounces)  1 cup acorn or butternut squash  Snack Foods 3 to 6 crackers  8 potato chips or 13 tortilla chips ( ounce to 1 ounce)  3 cups popped popcorn  Dairy 3/4 cup (6 ounces) nonfat plain yogurt, or yogurt with sugar-free sweetener  1 cup milk  1 cup plain rice, soy, coconut or flavored almond milk Sweets and Desserts  cup ice cream or frozen yogurt  1 tablespoon jam, jelly, pancake syrup, table sugar, or honey  2 tablespoons light pancake syrup  1 inch square of frosted cake or 2 inch square of unfrosted cake  2 small cookies (2/3 ounce each) or  large cookie  Sometimes you'll have to estimate carbohydrate amounts if you don't know the exact recipe. One cup of mixed foods like soups can have 1 to 2 carbohydrate servings, while some casseroles might have 2 or more servings of carbohydrate. Foods that have less than 20 calories in each serving can be counted as "free" foods. Count 1 cup raw vegetables, or  cup cooked non-starchy vegetables as "free" foods. If you eat 3 or more servings at one meal, then count them as 1 carbohydrate serving.  Foods without Carbohydrates  Not all foods contain carbohydrates. Meat, some dairy, fats, non-starchy vegetables, and many beverages don't contain  carbohydrate. So when you count carbohydrates, you can generally exclude chicken, pork, beef, fish, seafood, eggs, tofu, cheese, butter, sour cream, avocado, nuts, seeds, olives, mayonnaise, water, black coffee, unsweetened tea, and zero-calorie drinks. Vegetables with no or low carbohydrate include green beans, cauliflower, tomatoes, and onions. How much carbohydrate should I eat at each meal?  Carbohydrate counting can help you plan your meals and manage your  weight. Following are some starting points for carbohydrate intake at each meal. Work with your registered dietitian nutritionist to find the best range that works for your blood glucose and weight.   To Lose Weight To Maintain Weight  Women 2 - 3 carb servings 3 - 4 carb servings  Men 3 - 4 carb servings 4 - 5 carb servings  Checking your blood glucose after meals will help you know if you need to adjust the timing, type, or number of carbohydrate servings in your meal plan. Achieve and keep a healthy body weight by balancing your food intake and physical activity.  Tips How should I plan my meals?  Plan for half the food on your plate to include non-starchy vegetables, like salad greens, broccoli, or carrots. Try to eat 3 to 5 servings of non-starchy vegetables every day. Have a protein food at each meal. Protein foods include chicken, fish, meat, eggs, or beans (note that beans contain carbohydrate). These two food groups (non-starchy vegetables and proteins) are low in carbohydrate. If you fill up your plate with these foods, you will eat less carbohydrate but still fill up your stomach. Try to limit your carbohydrate portion to  of the plate.  What fats are healthiest to eat?  Diabetes increases risk for heart disease. To help protect your heart, eat more healthy fats, such as olive oil, nuts, and avocado. Eat less saturated fats like butter, cream, and high-fat meats, like bacon and sausage. Avoid trans fats, which are in all foods that list "partially hydrogenated oil" as an ingredient. What should I drink?  Choose drinks that are not sweetened with sugar. The healthiest choices are water, carbonated or seltzer waters, and tea and coffee without added sugars.  Sweet drinks will make your blood glucose go up very quickly. One serving of soda or energy drink is  cup. It is best to drink these beverages only if your blood glucose is low.  Artificially sweetened, or diet drinks, typically do not  increase your blood glucose if they have zero calories in them. Read labels of beverages, as some diet drinks do have carbohydrate and will raise your blood glucose. Label Reading Tips Read Nutrition Facts labels to find out how many grams of carbohydrate are in a food you want to eat. Don't forget: sometimes serving sizes on the label aren't the same as how much food you are going to eat, so you may need to calculate how much carbohydrate is in the food you are serving yourself.   Carbohydrate Counting for People with Diabetes Sample 1-Day Menu  Breakfast  cup yogurt, low fat, low sugar (1 carbohydrate serving)   cup cereal, ready-to-eat, unsweetened (1 carbohydrate serving)  1 cup strawberries (1 carbohydrate serving)   cup almonds ( carbohydrate serving)  Lunch 1, 5 ounce can chunk light tuna  2 ounces cheese, low fat cheddar  6 whole wheat crackers (1 carbohydrate serving)  1 small apple (1 carbohydrate servings)   cup carrots ( carbohydrate serving)   cup snap peas  1 cup 1% milk (1 carbohydrate serving)  Evening Meal Stir fry made with: 3 ounces chicken  1 cup Vertz rice (3 carbohydrate servings)   cup broccoli ( carbohydrate serving)   cup green beans   cup onions  1 tablespoon olive oil  2 tablespoons teriyaki sauce ( carbohydrate serving)  Evening Snack 1 extra small banana (1 carbohydrate serving)  1 tablespoon peanut butter   Carbohydrate Counting for People with Diabetes Vegan Sample 1-Day Menu  Breakfast 1 cup cooked oatmeal (2 carbohydrate servings)   cup blueberries (1 carbohydrate serving)  2 tablespoons flaxseeds  1 cup soymilk fortified with calcium and vitamin D  1 cup coffee  Lunch 2 slices whole wheat bread (2 carbohydrate servings)   cup baked tofu   cup lettuce  2 slices tomato  2 slices avocado   cup baby carrots ( carbohydrate serving)  1 orange (1 carbohydrate serving)  1 cup soymilk fortified with calcium and vitamin D   Evening  Meal Burrito made with: 1 6-inch corn tortilla (1 carbohydrate serving)  1 cup refried vegetarian beans (2 carbohydrate servings)   cup chopped tomatoes   cup lettuce   cup salsa  1/3 cup Tullos rice (1 carbohydrate serving)  1 tablespoon olive oil for rice   cup zucchini   Evening Snack 6 small whole grain crackers (1 carbohydrate serving)  2 apricots ( carbohydrate serving)   cup unsalted peanuts ( carbohydrate serving)    Carbohydrate Counting for People with Diabetes Vegetarian (Lacto-Ovo) Sample 1-Day Menu  Breakfast 1 cup cooked oatmeal (2 carbohydrate servings)   cup blueberries (1 carbohydrate serving)  2 tablespoons flaxseeds  1 egg  1 cup 1% milk (1 carbohydrate serving)  1 cup coffee  Lunch 2 slices whole wheat bread (2 carbohydrate servings)  2 ounces low-fat cheese   cup lettuce  2 slices tomato  2 slices avocado   cup baby carrots ( carbohydrate serving)  1 orange (1 carbohydrate serving)  1 cup unsweetened tea  Evening Meal Burrito made with: 1 6-inch corn tortilla (1 carbohydrate serving)   cup refried vegetarian beans (1 carbohydrate serving)   cup tomatoes   cup lettuce   cup salsa  1/3 cup Vinzant rice (1 carbohydrate serving)  1 tablespoon olive oil for rice   cup zucchini  1 cup 1% milk (1 carbohydrate serving)  Evening Snack 6 small whole grain crackers (1 carbohydrate serving)  2 apricots ( carbohydrate serving)   cup unsalted peanuts ( carbohydrate serving)    Copyright 2020  Academy of Nutrition and Dietetics. All rights reserved.  Using Nutrition Labels: Carbohydrate  Serving Size  Look at the serving size. All the information on the label is based on this portion. Servings Per Container  The number of servings contained in the package. Guidelines for Carbohydrate  Look at the total grams of carbohydrate in the serving size.  1 carbohydrate choice = 15 grams of carbohydrate. Range of Carbohydrate Grams Per Choice   Carbohydrate Grams/Choice Carbohydrate Choices  6-10   11-20 1  21-25 1  26-35 2  36-40 2  41-50 3  51-55 3  56-65 4  66-70 4  71-80 5    Copyright 2020  Academy of Nutrition and Dietetics. All rights reserved.

## 2021-02-27 NOTE — Progress Notes (Addendum)
Occupational Therapy Treatment Patient Details Name: Carrie Kline MRN: [id number] DOB: 10-Feb-1946 Today's Date: 02/27/2021    History of present illness Pt adm 7/1 with rt sided weakness. 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 PMH - gout.   OT comments  Pt progressing to minguardA with stedy for transfer from recliner to bed. Pt limited by R side weakness and decreased ability to care self.  Pt performing lap slides and using tray table to perform retraction/protraction and lateral/medially with towel on table; pt using accessory muscles and working on weightbearing in RUE. Pt performing transfers with R side remains 0 to 1/5 for strength. Pt would benefit from continued OT skilled services. OT following acutely.    Follow Up Recommendations  CIR    Equipment Recommendations  Wheelchair (measurements OT);Wheelchair cushion (measurements OT);3 in 1 bedside commode    Recommendations for Other Services      Precautions / Restrictions Precautions Precautions: Fall Precaution Comments: R hemiparesis Restrictions Weight Bearing Restrictions: No       Mobility Bed Mobility Overal bed mobility: Needs Assistance Bed Mobility: Sit to Sidelying         Sit to sidelying: Min assist General bed mobility comments: assist for R LE management    Transfers Overall transfer level: Needs assistance Equipment used: Ambulation equipment used;Rolling walker (2 wheeled) Transfers: Sit to/from Stand Sit to Stand: Min assist;+2 safety/equipment         General transfer comment: minguardA with sit to stand with stedy    Balance Overall balance assessment: Needs assistance Sitting-balance support: No upper extremity supported;Feet unsupported Sitting balance-Leahy Scale: Good     Standing balance support: Bilateral upper extremity supported Standing balance-Leahy Scale: Poor Standing balance comment: use of stedy                           ADL either performed or assessed  with clinical judgement   ADL Overall ADL's : Needs assistance/impaired                         Toilet Transfer: Min Pension scheme manager Details (indicate cue type and reason): stedy         Functional mobility during ADLs: Min guard (stedy using L side) General ADL Comments: Pt limited by R side weakness and decreased ability to care self.  Pt performing lap slides and using tray table to perform retraction/protraction.     Vision   Vision Assessment?: No apparent visual deficits   Perception     Praxis      Cognition Arousal/Alertness: Awake/alert Behavior During Therapy: WFL for tasks assessed/performed Overall Cognitive Status: Within Functional Limits for tasks assessed                                 General Comments: enthusiastic about rehab potential        Exercises Exercises: General Upper Extremity;General Lower Extremity;Other exercises General Exercises - Upper Extremity Shoulder Flexion: PROM;5 reps;Right;Seated Elbow Flexion: PROM;Right;5 reps;Seated Elbow Extension: PROM;Right;5 reps;Seated Wrist Flexion: PROM;Right;5 reps;Seated Wrist Extension: PROM;Right;5 reps;Seated Digit Composite Flexion: PROM;Right;5 reps;Seated Composite Extension: PROM;Right;5 reps;Seated Other Exercises Other Exercises: slides retration/protaction and lateral/medially with towel on table   Shoulder Instructions       General Comments VSS    Pertinent Vitals/ Pain       Pain Assessment: No/denies pain  Home Living  Prior Functioning/Environment              Frequency  Min 2X/week        Progress Toward Goals  OT Goals(current goals can now be found in the care plan section)  Progress towards OT goals: Progressing toward goals  Acute Rehab OT Goals Patient Stated Goal: go home OT Goal Formulation: With patient Time For Goal Achievement: 03/10/21 Potential to  Achieve Goals: Fair  Plan Discharge plan remains appropriate    Co-evaluation                 AM-PAC OT "6 Clicks" Daily Activity     Outcome Measure   Help from another person eating meals?: A Little Help from another person taking care of personal grooming?: A Lot Help from another person toileting, which includes using toliet, bedpan, or urinal?: A Lot Help from another person bathing (including washing, rinsing, drying)?: A Lot Help from another person to put on and taking off regular upper body clothing?: A Little Help from another person to put on and taking off regular lower body clothing?: A Lot 6 Click Score: 14    End of Session Equipment Utilized During Treatment: Gait belt  OT Visit Diagnosis: Unsteadiness on feet (R26.81);Muscle weakness (generalized) (M62.81);Hemiplegia and hemiparesis Hemiplegia - Right/Left: Right Hemiplegia - dominant/non-dominant: Non-Dominant Hemiplegia - caused by: Cerebral infarction   Activity Tolerance Patient tolerated treatment well   Patient Left in bed;with call bell/phone within reach;with family/visitor present   Nurse Communication Mobility status        Time: 2130-8657 OT Time Calculation (min): 38 min  Charges: OT General Charges $OT Visit: 1 Visit OT Treatments $Self Care/Home Management : 8-22 mins $Neuromuscular Re-education: 8-22 mins  Flora Lipps, OTR/L Acute Rehabilitation Services Pager: [phone] Office: [phone]    Carrie Kline  C 02/27/2021, 1:09 PM

## 2021-02-28 LAB — GLUCOSE, CAPILLARY
Glucose-Capillary: 138 mg/dL — ABNORMAL HIGH (ref 70–99)
Glucose-Capillary: 119 mg/dL — ABNORMAL HIGH (ref 70–99)
Glucose-Capillary: 92 mg/dL (ref 70–99)
Glucose-Capillary: 163 mg/dL — ABNORMAL HIGH (ref 70–99)

## 2021-02-28 MED ORDER — METFORMIN HCL 500 MG PO TABS
500.0000 mg | ORAL_TABLET | Freq: Every day | ORAL | Status: DC
  Administered 2021-03-01: 500 mg via ORAL
  Filled 2021-02-28: qty 1

## 2021-02-28 NOTE — Progress Notes (Signed)
Inpatient Rehab Admissions Coordinator:   I do not have a bed on CIR for this pt. Today. I opened a case with her insurance this AM and await a response.Pt. updated. I will follow for potential admit pending insurance auth and bed availability.   Megan Salon, MS, CCC-SLP Rehab Admissions Coordinator  [phone] (celll) [phone] (office)

## 2021-02-28 NOTE — Care Management Important Message (Signed)
Important Message  Patient Details  Name: Carrie Kline MRN: [id number] Date of Birth: September 04, 1945   Medicare Important Message Given:  Yes     Oralia Rud Taia Bramlett 02/28/2021, 3:28 PM

## 2021-02-28 NOTE — Progress Notes (Signed)
HD#4 Subjective:   Carrie Kline is feeling well today and thinks she is slowly getting more sensation and movement in her arm and leg. She got a little bit teary on exam when talking about wanting to be able to walk again. She was concerned that something she did caused this stroke and was comforted to hear that was not the case. She asked how long it would take for her to get sensation and movement back.  Objective:  Vital signs in last 24 hours: Vitals:   02/27/21 2319 02/28/21 0438 02/28/21 0805 02/28/21 1142  BP: 129/65 124/79 118/65 130/78  Pulse: 81 79 84 78  Resp: 18 18 18 18   Temp: 98.5 F (36.9 C) 98 F (36.7 C) 97.7 F (36.5 C) 98.2 F (36.8 C)  TempSrc: Oral Oral Oral Oral  SpO2: 97% 97% 99% 100%   Supplemental O2: Room Air SpO2: 100 %   Physical Exam:  Constitutional: elderly woman resting in bed in no acute distress Cardiovascular: regular rate and rhythm Pulmonary/Chest: normal work of breathing on room air MSK: normal bulk and tone Neurological: alert & oriented x 3, trace movement in right upper and lower extremities, increasing sensation on right side, facial droop present on the right side, tongue deviation to the left Skin: warm and dry  There were no vitals filed for this visit.   Intake/Output Summary (Last 24 hours) at 02/28/2021 1445 Last data filed at 02/28/2021 05/01/2021 Gross per 24 hour  Intake 400 ml  Output 900 ml  Net -500 ml   Net IO Since Admission: -2,200 mL [02/28/21 1445]  Pertinent Labs: CBC Latest Ref Rng & Units 02/25/2021 02/24/2021 02/24/2021  WBC 4.0 - 10.5 K/uL 7.0 - 6.6  Hemoglobin 12.0 - 15.0 g/dL 04/27/2021 58.3 09.4  Hematocrit 36.0 - 46.0 % 37.6 38.0 38.4  Platelets 150 - 400 K/uL 201 - 193    CMP Latest Ref Rng & Units 02/25/2021 02/24/2021 02/24/2021  Glucose 70 - 99 mg/dL 04/27/2021) 808(U) 110(R)  BUN 8 - 23 mg/dL 19 159(Y) 23  Creatinine 0.44 - 1.00 mg/dL 58(P 9.29 2.44  Sodium 135 - 145 mmol/L 136 141 137  Potassium 3.5 - 5.1 mmol/L 3.7  4.0 4.0  Chloride 98 - 111 mmol/L 105 107 107  CO2 22 - 32 mmol/L 24 - 24  Calcium 8.9 - 10.3 mg/dL 9.4 - 9.3  Total Protein 6.5 - 8.1 g/dL - - 7.6  Total Bilirubin 0.3 - 1.2 mg/dL - - 0.7  Alkaline Phos 38 - 126 U/L - - 88  AST 15 - 41 U/L - - 19  ALT 0 - 44 U/L - - 15    Imaging: No results found.  Assessment/Plan:   Principal Problem:   Acute cerebral infarction [facility]) Active Problems:   Thyroid nodule   Diabetes (HCC)   Patient Summary: Carrie Kline is a 75 y.o. with a pertinent history of gout allergic and rhinitis who presented to the ED with right-sided weakness and dysarthria and was admitted for acute left pons infarct.  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  MRI findings showing acute infarction of the left pons likely thrombotic. Physical exam findings show right upper and lower extremity strength of 0/5 with trace movement. Paresthesias improving. - Continue aspirin 81 mg daily - Continue clopidogrel 75 mg daily for 3 weeks, started 7/1 - Continue atorvastatin 40 mg daily -  Continue to work with PT - Pending for bed availability, will discharge to CIR   Newly Diagnosed Diabetes: A1c of 7.1, Last CBG of 156 - Sliding scale insulin - Lantus 15 units nightly   Hyperlipidemia:  Lipid panel showing a total cholesterol 177 with an LDL of 121 ASCVD risk of 29%. - Continue Lipitor 40 mg daily   Hypertension: Blood pressures with systolics in the 130s to 150s, now outside of permissive hypertensive window. - Lisinopril 5 mg daily  Thyroid Nodule: TSH 0.014 with fT4 of 1.04 consistent with subclinical hypothyroidism.  We will repeat in 6 weeks time. - Free T4: 1.04 - Nodule #3 outpatient biopsy - Nodule #5 outpatient ultrasound-guided biopsy - Follow-up nodules #4 and #6 in 1 year   Diffuse idiopathic skeletal hyperostosis: Continue to  monitor for symptoms    Prior to Admission Living Arrangement: Home Anticipated Discharge Location: CIR Barriers to Discharge: Medically stable for discharge pending bed availability Dispo: Anticipated discharge to 1-2 days   Ilene Qua, MD Internal Medicine Resident PGY-1 Pager [phone] Please contact the on call pager after 5 pm and on weekends at [phone].

## 2021-02-28 NOTE — Discharge Summary (Addendum)
Name: Carrie Kline MRN: [id number] DOB: 30-Jul-1946 75 y.o. PCP: Patient, No Pcp Per (Inactive)  Date of Admission: 02/24/2021  4:50 AM Date of Discharge: 03/01/2021 Attending Physician: Tyson Alias, *  Discharge Diagnosis: Principal Problem:   Acute cerebral infarction [facility]) Active Problems:   Thyroid nodule   Diabetes [facility])   Discharge Medications: Allergies as of 03/01/2021       Reactions   Darvon [propoxyphene]    itching   Hydrocortisone    itching        Medication List     STOP taking these medications    amoxicillin-clavulanate 875-125 MG tablet Commonly known as: AUGMENTIN   benzonatate 100 MG capsule Commonly known as: TESSALON   ibuprofen 200 MG tablet Commonly known as: ADVIL   loratadine 10 MG tablet Commonly known as: CLARITIN   naproxen sodium 220 MG tablet Commonly known as: ALEVE   ondansetron 4 MG disintegrating tablet Commonly known as: Zofran ODT       TAKE these medications    acetaminophen 325 MG tablet Commonly known as: TYLENOL Take 2 tablets (650 mg total) by mouth every 4 (four) hours as needed for mild pain (or temp > 37.5 C (99.5 F)).   aspirin 81 MG EC tablet Take 1 tablet (81 mg total) by mouth daily. Swallow whole.   atorvastatin 40 MG tablet Commonly known as: LIPITOR Take 1 tablet (40 mg total) by mouth daily.   clopidogrel 75 MG tablet Commonly known as: PLAVIX Take 1 tablet (75 mg total) by mouth daily for 16 days.   lisinopril 5 MG tablet Commonly known as: ZESTRIL Take 1 tablet (5 mg total) by mouth daily.   metFORMIN 500 MG tablet Commonly known as: GLUCOPHAGE Take 1 tablet (500 mg total) by mouth daily with breakfast.   senna-docusate 8.6-50 MG tablet Commonly known as: Senokot-S Take 1-2 tablets by mouth at bedtime as needed for mild constipation or moderate constipation.        Disposition and follow-up:   Ms.Carrie Kline was discharged from [facility] in [place]  condition.  At the hospital follow up visit please address:  1.    Acute cerebral infarction: Please assess how patient is progressing with rehab, clopidogrel to continue for 3 weeks started on 7/1.  2. Newly Diagnosed Diabetes - patient started on metformin 500 mg once daily  3. Workup of thryoid nodules requiring biopsy  2.  Labs / imaging needed at time of follow-up: None  3.  Pending labs/ test needing follow-up: None  Follow-up Appointments:  Follow-up Information     [facility]. Schedule an appointment as soon as possible for a visit in 1 month(s).   Specialty: Neurology Why: stroke clinic Contact information: [address] [place] [address] [phone]                Hospital Course by problem list: 1. Acute Cerebral Infarct Patient experienced acute right leg weakness that caused her to fall. EMS was called but patient improved enough to walk back to bed. Her symptoms progressed over the subsequent hours to involve dysarthria, weakness of her right  arm, and she was eventually brought to the hospital via EMS for a code stroke. CT head was negative for large vessel occlusion. MRI found an acute stroke of the left pons thought to be likely thrombotic. CT angio of the carotid showed moderate left vertebral artery stenosis, and mild to moderate distal PCA stenosis.  Patient  was started on aspirin 81 mg, atorvastatin 40 mg, and clopidogrel 75 mg for three weeks. Upon discharge patient had trace movement in her upper and lower right extremity with reduced but improving sensation on her right side.   2.Newly Diagnosed Diabetes Patient found to have an A1c of 7.1, started on basal insulin while inpatient. Transitioned to metformin 500 mg once daily in preparation for discharge.  3. Thyroid nodules Patient found to have several thyroid nodules with radiology report recommending outpatient workout and biopsy of two nodules, and  two nodules with 1 year follow up.  4. Hyperlipidemia Patient found to have elevated total cholesterol 177 and an LDL of 121 ASCVD risk of 29%, started on Lipitor 40 mg.  5. Hypertension Patient takes lisinopril 5 mg daily as an outpatient. Lisinopril initially held but restarted during hospitalization.  6. Diffuse idiopathic skeletal hyperostosis Patient monitored for symptoms, required no interventions while inpatient.   Discharge Exam:   BP 130/78 (BP Location: Left Arm)   Pulse 78   Temp 98.2 F (36.8 C) (Oral)   Resp 18   SpO2 100%  Discharge exam:  Constitutional: elderly woman resting in bed in no acute distress MSK: normal bulk and tone Neurological: alert & oriented x 3, trace movement in right upper and lower extremities, increasing sensation on right side Skin: warm and dry  Pertinent Labs, Studies, and Procedures:  Free T4 0.61 - 1.12 ng/dL 4.19    TSH 3.790 - 2.409 uIU/mL 0.014 Low     Hgb A1c MFr Bld 4.8 - 5.6 % 7.4 High     Cholesterol 0 - 200 mg/dL 735   Triglycerides <329 mg/dL 80   HDL >92 mg/dL 34 Low    Total CHOL/HDL Ratio RATIO 5.0   VLDL 0 - 40 mg/dL 16   LDL Cholesterol 0 - 99 mg/dL 426 High     MR Brain Wo Contrast IMPRESSION: Acute infarct in the left pons.  Slight edema without mass effect.  CT Head Code Stroke Wo Contrast IMPRESSION: 1. Normal for age non contrast CT appearance of the brain. ASPECTS 10.  CT Angio Head Neck W Wo CM IMPRESSION: 1. Negative for large vessel occlusion. And no circle-of-Willis branch occlusion identified.   2. Generally mild carotid and anterior circulation atherosclerosis. But more pronounced vertebral and posterior circulation atherosclerosis including: - Moderate stenosis at the origin of the dominant left vertebral artery. - mild to moderate stenoses of the distal PCA branches.   3. A 2 cm left thyroid nodule meets size criteria for further characterization. Recommend Thyroid Ultrasound.   4.  Diffuse idiopathic skeletal hyperostosis (DISH).    US Thyroid  IMPRESSION: 1. Multiple thyroid nodules. 2. Nodule #3 in the right mid thyroid lobe measures up to 1.9 cm and this is a TR 4 nodule. This nodule meets criteria for biopsy. 3. Nodule #5 in the left mid thyroid lobe measures up to 2.1 cm and this is a TR 4 nodule. This nodule meets criteria for ultrasound-guided biopsy. 4. Nodules #4 and #6 both meet criteria for 1 year follow-up.   Echocardiogram complete IMPRESSIONS    1. Left ventricular ejection fraction, by estimation, is 55 to 60%. The  left ventricle has normal function. The left ventricle has no regional  wall motion abnormalities. Left ventricular diastolic parameters were  normal.   2. Right ventricular systolic function is normal. The right ventricular  size is normal.   3. The mitral valve is normal in structure. Trivial mitral valve  regurgitation.   4. The aortic valve was not well visualized. Aortic valve regurgitation  is not visualized.    Discharge Instructions: Discharge Instructions     Ambulatory referral to Neurology   Complete by: As directed    Follow up with stroke clinic NP (Jessica Vanschaick or Darrol Angel, if both not available, consider Manson Allan, or Ahern) at [facility] in about 4 weeks. Thanks.       Signed: Ilene Qua, MD 02/28/2021, 4:20 PM   Pager: [phone]

## 2021-03-01 ENCOUNTER — Encounter (HOSPITAL_COMMUNITY): Payer: Self-pay | Admitting: Physical Medicine & Rehabilitation

## 2021-03-01 ENCOUNTER — Inpatient Hospital Stay (HOSPITAL_COMMUNITY)
Admission: RE | Admit: 2021-03-01 | Discharge: 2021-03-24 | DRG: 057 | Disposition: A | Discharge status: Home / Self Care | Payer: Medicare Other | Source: Intra-hospital | Attending: Physical Medicine & Rehabilitation | Admitting: Physical Medicine & Rehabilitation

## 2021-03-01 DIAGNOSIS — K521 Toxic gastroenteritis and colitis: Secondary | ICD-10-CM | POA: Diagnosis not present

## 2021-03-01 DIAGNOSIS — I69322 Dysarthria following cerebral infarction: Secondary | ICD-10-CM

## 2021-03-01 DIAGNOSIS — Z833 Family history of diabetes mellitus: Secondary | ICD-10-CM

## 2021-03-01 DIAGNOSIS — E785 Hyperlipidemia, unspecified: Secondary | ICD-10-CM | POA: Diagnosis present

## 2021-03-01 DIAGNOSIS — I6389 Other cerebral infarction: Secondary | ICD-10-CM

## 2021-03-01 DIAGNOSIS — R2689 Other abnormalities of gait and mobility: Secondary | ICD-10-CM | POA: Diagnosis present

## 2021-03-01 DIAGNOSIS — M109 Gout, unspecified: Secondary | ICD-10-CM | POA: Diagnosis present

## 2021-03-01 DIAGNOSIS — E11649 Type 2 diabetes mellitus with hypoglycemia without coma: Secondary | ICD-10-CM | POA: Diagnosis not present

## 2021-03-01 DIAGNOSIS — Z7902 Long term (current) use of antithrombotics/antiplatelets: Secondary | ICD-10-CM | POA: Diagnosis not present

## 2021-03-01 DIAGNOSIS — I639 Cerebral infarction, unspecified: Secondary | ICD-10-CM

## 2021-03-01 DIAGNOSIS — Z79899 Other long term (current) drug therapy: Secondary | ICD-10-CM | POA: Diagnosis not present

## 2021-03-01 DIAGNOSIS — R059 Cough, unspecified: Secondary | ICD-10-CM | POA: Diagnosis not present

## 2021-03-01 DIAGNOSIS — Y92239 Unspecified place in hospital as the place of occurrence of the external cause: Secondary | ICD-10-CM | POA: Diagnosis not present

## 2021-03-01 DIAGNOSIS — E1169 Type 2 diabetes mellitus with other specified complication: Secondary | ICD-10-CM | POA: Diagnosis not present

## 2021-03-01 DIAGNOSIS — N179 Acute kidney failure, unspecified: Secondary | ICD-10-CM | POA: Diagnosis not present

## 2021-03-01 DIAGNOSIS — Z8249 Family history of ischemic heart disease and other diseases of the circulatory system: Secondary | ICD-10-CM

## 2021-03-01 DIAGNOSIS — Z7984 Long term (current) use of oral hypoglycemic drugs: Secondary | ICD-10-CM

## 2021-03-01 DIAGNOSIS — E041 Nontoxic single thyroid nodule: Secondary | ICD-10-CM | POA: Diagnosis present

## 2021-03-01 DIAGNOSIS — I69351 Hemiplegia and hemiparesis following cerebral infarction affecting right dominant side: Principal | ICD-10-CM

## 2021-03-01 DIAGNOSIS — Z7982 Long term (current) use of aspirin: Secondary | ICD-10-CM | POA: Diagnosis not present

## 2021-03-01 DIAGNOSIS — T383X5A Adverse effect of insulin and oral hypoglycemic [antidiabetic] drugs, initial encounter: Secondary | ICD-10-CM | POA: Diagnosis present

## 2021-03-01 DIAGNOSIS — E860 Dehydration: Secondary | ICD-10-CM | POA: Diagnosis not present

## 2021-03-01 DIAGNOSIS — I10 Essential (primary) hypertension: Secondary | ICD-10-CM | POA: Diagnosis present

## 2021-03-01 DIAGNOSIS — Z888 Allergy status to other drugs, medicaments and biological substances status: Secondary | ICD-10-CM | POA: Diagnosis not present

## 2021-03-01 DIAGNOSIS — E669 Obesity, unspecified: Secondary | ICD-10-CM | POA: Diagnosis not present

## 2021-03-01 DIAGNOSIS — I69398 Other sequelae of cerebral infarction: Secondary | ICD-10-CM | POA: Diagnosis not present

## 2021-03-01 DIAGNOSIS — Z90710 Acquired absence of both cervix and uterus: Secondary | ICD-10-CM | POA: Diagnosis not present

## 2021-03-01 LAB — GLUCOSE, CAPILLARY
Glucose-Capillary: 134 mg/dL — ABNORMAL HIGH (ref 70–99)
Glucose-Capillary: 123 mg/dL — ABNORMAL HIGH (ref 70–99)
Glucose-Capillary: 119 mg/dL — ABNORMAL HIGH (ref 70–99)
Glucose-Capillary: 116 mg/dL — ABNORMAL HIGH (ref 70–99)

## 2021-03-01 MED ORDER — CLOPIDOGREL BISULFATE 75 MG PO TABS
75.0000 mg | ORAL_TABLET | Freq: Every day | ORAL | Status: DC
  Administered 2021-03-02 – 2021-03-21 (×20): 75 mg via ORAL
  Filled 2021-03-01 (×20): qty 1

## 2021-03-01 MED ORDER — METFORMIN HCL 500 MG PO TABS: 500.0000 mg | ORAL_TABLET | Freq: Every day | ORAL | 0 refills | Status: DC

## 2021-03-01 MED ORDER — LISINOPRIL 5 MG PO TABS
5.0000 mg | ORAL_TABLET | Freq: Every day | ORAL | Status: DC
  Administered 2021-03-02 – 2021-03-05 (×4): 5 mg via ORAL
  Filled 2021-03-01 (×4): qty 1

## 2021-03-01 MED ORDER — ASPIRIN EC 81 MG PO TBEC
81.0000 mg | DELAYED_RELEASE_TABLET | Freq: Every day | ORAL | Status: DC
  Administered 2021-03-02 – 2021-03-24 (×23): 81 mg via ORAL
  Filled 2021-03-01 (×23): qty 1

## 2021-03-01 MED ORDER — ACETAMINOPHEN 325 MG PO TABS: 650.0000 mg | ORAL_TABLET | ORAL | 0 refills | Status: AC | PRN

## 2021-03-01 MED ORDER — LISINOPRIL 5 MG PO TABS: 5.0000 mg | ORAL_TABLET | Freq: Every day | ORAL | 0 refills | Status: DC

## 2021-03-01 MED ORDER — ACETAMINOPHEN 325 MG PO TABS
650.0000 mg | ORAL_TABLET | ORAL | Status: DC | PRN
  Administered 2021-03-02 – 2021-03-23 (×10): 650 mg via ORAL
  Filled 2021-03-01 (×11): qty 2

## 2021-03-01 MED ORDER — BLOOD PRESSURE CONTROL BOOK
Freq: Once | Status: AC
  Filled 2021-03-01: qty 1

## 2021-03-01 MED ORDER — INSULIN ASPART 100 UNIT/ML IJ SOLN
0.0000 [IU] | Freq: Three times a day (TID) | INTRAMUSCULAR | Status: DC
  Administered 2021-03-02 – 2021-03-06 (×7): 2 [IU] via SUBCUTANEOUS

## 2021-03-01 MED ORDER — ATORVASTATIN CALCIUM 40 MG PO TABS: 40.0000 mg | ORAL_TABLET | Freq: Every day | ORAL | 0 refills | Status: DC

## 2021-03-01 MED ORDER — ACETAMINOPHEN 650 MG RE SUPP: 650.0000 mg | RECTAL | Status: DC | PRN

## 2021-03-01 MED ORDER — ASPIRIN 81 MG PO TBEC: 81.0000 mg | DELAYED_RELEASE_TABLET | Freq: Every day | ORAL | 11 refills | Status: DC

## 2021-03-01 MED ORDER — ACETAMINOPHEN 160 MG/5ML PO SOLN: 650.0000 mg | ORAL | Status: DC | PRN

## 2021-03-01 MED ORDER — SENNOSIDES-DOCUSATE SODIUM 8.6-50 MG PO TABS
1.0000 | ORAL_TABLET | Freq: Every evening | ORAL | Status: DC | PRN
  Administered 2021-03-05 – 2021-03-07 (×2): 2 via ORAL
  Filled 2021-03-01 (×2): qty 2

## 2021-03-01 MED ORDER — CLOPIDOGREL BISULFATE 75 MG PO TABS: 75.0000 mg | ORAL_TABLET | Freq: Every day | ORAL | 0 refills | Status: DC

## 2021-03-01 MED ORDER — LIVING WELL WITH DIABETES BOOK
Freq: Once | Status: AC
  Filled 2021-03-01: qty 1

## 2021-03-01 MED ORDER — SENNOSIDES-DOCUSATE SODIUM 8.6-50 MG PO TABS: 1.0000 | ORAL_TABLET | Freq: Every evening | ORAL | 0 refills | Status: DC | PRN

## 2021-03-01 MED ORDER — ATORVASTATIN CALCIUM 40 MG PO TABS
40.0000 mg | ORAL_TABLET | Freq: Every day | ORAL | Status: DC
  Administered 2021-03-02 – 2021-03-24 (×23): 40 mg via ORAL
  Filled 2021-03-01 (×23): qty 1

## 2021-03-01 MED ORDER — METFORMIN HCL 500 MG PO TABS
500.0000 mg | ORAL_TABLET | Freq: Every day | ORAL | Status: DC
  Administered 2021-03-02 – 2021-03-04 (×3): 500 mg via ORAL
  Filled 2021-03-01 (×3): qty 1

## 2021-03-01 MED ORDER — ENOXAPARIN SODIUM 40 MG/0.4ML IJ SOSY
40.0000 mg | PREFILLED_SYRINGE | INTRAMUSCULAR | Status: DC
  Administered 2021-03-02 – 2021-03-23 (×22): 40 mg via SUBCUTANEOUS
  Filled 2021-03-01 (×22): qty 0.4

## 2021-03-01 NOTE — Progress Notes (Signed)
Inpatient Rehabilitation Medication Review by a Pharmacist  A complete drug regimen review was completed for this patient to identify any potential clinically significant medication issues.  Clinically significant medication issues were identified:  no  Check AMION for pharmacist assigned to patient if future medication questions/issues arise during this admission.  Pharmacist comments:   Time spent performing this drug regimen review (minutes):  10   Tad Moore 03/01/2021 6:07 PM

## 2021-03-01 NOTE — Progress Notes (Signed)
Patient arrived on unit, oriented to unit. Reviewed medications, therapy schedule, rehab routine and plan of care. States an understanding of information reviewed. No complications noted at this time. Patient reports no pain and is AX4 Carrie Kline.   

## 2021-03-01 NOTE — Progress Notes (Signed)
Report called to 4000 staff RN. Pt GCS 15, NIH 9.

## 2021-03-01 NOTE — Progress Notes (Addendum)
PMR Admission Coordinator Pre-Admission Assessment   Patient: Carrie Kline is an 75 y.o., female MRN: [id number] DOB: 1946-02-02 Height:   Weight:     Insurance Information HMO: yes    PPO:      PCP:      IPA:      80/20:      OTHER: PRIMARY: BCBS Medicare Policy#: [id number]      Subscriber: patient CM Name:       TJQZE#:[phone]    UQJ#:[phone]   Pre-Cert#: [id number]        Received a call from [facility] 7/6 that Pt. Is approved 7/6 -7/17   Employer: Phone: [phone] Eff Date: 08/27/2020 - still active Deductible: $0 OOP Max: $4,200 ($90 met) CIR: $335/admission co-pay SNF: $188/day copay, limited to 100 days/cal yr Outpatient:  $40 copay Home Health:  100% coverage DME: 80% coverage, 20% co-insurance Providers: in-network   SECONDARY:       Policy#:      Phone#:   Development worker, community:       Phone#:   [employer] Engineer, [employer]" for patients in Inpatient Rehabilitation Facilities with attached "Privacy Act Foster Records" was provided and verbally reviewed with: Patient   Emergency Contact Information Contact Information       Name Relation Home Work Mobile    [place] Daughter [phone]               Current Medical History  Patient Admitting Diagnosis: Left Pons Infarct History of Present Illness: Pt is a 75 year old female with medical hx significant for: gout and seasonal allergies. On 02/24/21, pt woke up to use the bathroom and fell getting out of bed d/t right leg not working well. EMS activated but weakness attributed to sleeping position and at that time she had minimal difficulty with her arms or speech.  Her symptoms worsened; pt called daughter to inform her she couldn't move her right side at all and her speech was slurred. When daughter arrived she noted pt had right-sided facial droop. EMS activated again and pt presented to hospital as code stroke. tPA not administered. CT head revealed no  acute findings. CT angiogram of head and neck had incidental finding of 2 cm thyroid nodule but no critical stenosis. MRI of brain revealed acute infarct in left pons.  Pt also received new diagnosis of diabetes. Therapy evaluations completed and CIR recommended d/t pt's deficits in mobility and ability to completed ADLs independently.   Complete NIHSS TOTAL: 0   Patient's medical record from [facility] has been reviewed by the rehabilitation admission coordinator and physician.   Past Medical History      Past Medical History:  Diagnosis Date   Gout     Seasonal allergies        Family History   family history includes Diabetes in her sister; Heart attack in her father; Thyroid disease in her daughter.   Prior Rehab/Hospitalizations Has the patient had prior rehab or hospitalizations prior to admission? No   Has the patient had major surgery during 100 days prior to admission? No               Current Medications   Current Facility-Administered Medications:    stroke: mapping our early stages of recovery book, , Does not apply, Once, Bhagat, Srishti L, MD   acetaminophen (TYLENOL) tablet 650 mg, 650 mg, Oral, Q4H PRN, 650 mg at 02/25/21 1456 **OR** acetaminophen (TYLENOL) 160 MG/5ML solution 650 mg, 650  mg, Per Tube, Q4H PRN **OR** acetaminophen (TYLENOL) suppository 650 mg, 650 mg, Rectal, Q4H PRN, Marianna Payment, MD   aspirin EC tablet 81 mg, 81 mg, Oral, Daily, Bhagat, Srishti L, MD, 81 mg at 02/26/21 1115   atorvastatin (LIPITOR) tablet 40 mg, 40 mg, Oral, Daily, Marianna Payment, MD, 40 mg at 02/26/21 1116   [COMPLETED] clopidogrel (PLAVIX) tablet 300 mg, 300 mg, Oral, Once, 300 mg at 02/24/21 0615 **FOLLOWED BY** clopidogrel (PLAVIX) tablet 75 mg, 75 mg, Oral, Daily, Bhagat, Srishti L, MD, 75 mg at 02/26/21 1115   enoxaparin (LOVENOX) injection 40 mg, 40 mg, Subcutaneous, Q24H, Marianna Payment, MD, 40 mg at 02/26/21 1117   insulin aspart (novoLOG) injection 0-9 Units, 0-9  Units, Subcutaneous, TID WC, [name], MD, 1 Units at 02/26/21 1117   insulin glargine (LANTUS) injection 15 Units, 15 Units, Subcutaneous, QHS, [name], MD, 15 Units at 02/25/21 2144   lisinopril (ZESTRIL) tablet 5 mg, 5 mg, Oral, Daily, [name], MD   living well with diabetes book MISC, , Does not apply, Once, Axel Filler, MD   senna-docusate (Senokot-S) tablet 1-2 tablet, 1-2 tablet, Oral, QHS PRN, Marianna Payment, MD   Patients Current Diet:  Diet Order                  Diet Carb Modified Fluid consistency: Thin; Room service appropriate? Yes  Diet effective now                         Precautions / Restrictions Precautions Precautions: Fall Precaution Comments: R hemiparesis Restrictions Weight Bearing Restrictions: No    Has the patient had 2 or more falls or a fall with injury in the past year? No   Prior Activity Level Community (5-7x/wk): working; gets out of house daily   Prior Functional Level Self Care: Did the patient need help bathing, dressing, using the toilet or eating? Independent   Indoor Mobility: Did the patient need assistance with walking from room to room (with or without device)? Independent   Stairs: Did the patient need assistance with internal or external stairs (with or without device)? Independent   Functional Cognition: Did the patient need help planning regular tasks such as shopping or remembering to take medications? Independent   Home Assistive Devices / Equipment Home Assistive Devices/Equipment: None Home Equipment: None   Prior Device Use: Indicate devices/aids used by the patient prior to current illness, exacerbation or injury? None of the above   Current Functional Level Cognition   Arousal/Alertness: Awake/alert Overall Cognitive Status: Within Functional Limits for tasks assessed Orientation Level: Oriented X4 General Comments: tearful about not being able to walk Attention:  Selective Selective Attention: Appears intact Memory: Appears intact Awareness: Appears intact    Extremity Assessment (includes Sensation/Coordination)   Upper Extremity Assessment: RUE deficits/detail RUE Deficits / Details: flaccid - slight thumb AROM noted.  Able to move through PROM RUE Sensation: WNL RUE Coordination: decreased fine motor, decreased gross motor  Lower Extremity Assessment: Defer to PT evaluation RLE Deficits / Details: Hip/knee with trace. Ankle 0/5     ADLs   Overall ADL's : Needs assistance/impaired Eating/Feeding: Set up, Bed level Grooming: Wash/dry hands, Wash/dry face, Moderate assistance, Bed level Upper Body Dressing : Moderate assistance, Sitting Lower Body Dressing: Maximal assistance, Sitting/lateral leans     Mobility   Overal bed mobility: Needs Assistance Bed Mobility: Sidelying to Sit, Sit to Sidelying Sidelying to sit: Min assist Sit to sidelying:  Min assist General bed mobility comments: assist for R LE management     Transfers   Overall transfer level: Needs assistance Equipment used: Ambulation equipment used Transfer via Lift Equipment: Stedy Transfers: Sit to/from Stand Sit to Stand: Min assist, +2 safety/equipment General transfer comment: min A for power up and steadying in [place], and for controlled descent to chair     Ambulation / Gait / Stairs / Proofreader / Balance Balance Overall balance assessment: Needs assistance Sitting-balance support: No upper extremity supported, Feet unsupported Sitting balance-Leahy Scale: Good Standing balance support: Bilateral upper extremity supported Standing balance-Leahy Scale: Poor Standing balance comment: requires L UE support and R knee blocking to maintain balance     Special needs/care consideration Diabetic management novoLOG: 0-9 units 3 times daily; Lantus: 15 units daily at bedtim, External Urinary Catheter and Designated visitor Hendricks Limes,  daughter    Previous Home Environment (from acute therapy documentation) Living Arrangements: Alone  Lives With: Son Available Help at Discharge: Family, Available 24 hours/day Type of Home: House Home Layout: Two level, 1/2 bath on main level Alternate Level Stairs-Rails: Right Alternate Level Stairs-Number of Steps: 12 Home Access: Level entry Entrance Stairs-Number of Steps: 1 Bathroom Shower/Tub: Chiropodist: Standard Bathroom Accessibility: Yes How Accessible: Accessible via walker Home Care Services: No Additional Comments: Pt plans to go home with daughter to the above house at [place]   Discharge Living Setting Plans for Discharge Living Setting: House (daughter's house) Type of Home at Discharge: House Discharge Home Layout: Two level, 1/2 bath on main level Alternate Level Stairs-Rails: Left Alternate Level Stairs-Number of Steps: 12-13 Discharge Home Access: Level entry Discharge Bathroom Shower/Tub: Tub/shower unit Discharge Bathroom Toilet: Standard Discharge Bathroom Accessibility: Yes How Accessible: Accessible via walker Does the patient have any problems obtaining your medications?: No   Social/Family/Support Systems Anticipated Caregiver: Hendricks Limes and son Anticipated Caregiver's Contact Information: [phone] Caregiver Availability: 24/7 Discharge Plan Discussed with Primary Caregiver: Yes Is Caregiver In Agreement with Plan?: Yes Does Caregiver/Family have Issues with Lodging/Transportation while Pt is in Rehab?: No   Goals Patient/Family Goal for Rehab: Supervision: PT/OT Expected length of stay: 14-21 days Pt/Family Agrees to Admission and willing to participate: Yes Program Orientation Provided & Reviewed with Pt/Caregiver Including Roles  & Responsibilities: Yes   Decrease burden of Care through IP rehab admission: NA   Possible need for SNF placement upon discharge: Not anticipated   Patient Condition: I have reviewed  medical records from [facility], spoken with CM, and patient. I met with patient at the bedside for inpatient rehabilitation assessment.  Patient will benefit from ongoing PT and OT, can actively participate in 3 hours of therapy a day 5 days of the week, and can make measurable gains during the admission.  Patient will also benefit from the coordinated team approach during an Inpatient Acute Rehabilitation admission.  The patient will receive intensive therapy as well as Rehabilitation physician, nursing, social worker, and care management interventions.  Due to safety, skin/wound care, disease management, medication administration, pain management, and patient education the patient requires 24 hour a day rehabilitation nursing.  The patient is currently min A with mobility and basic ADLs.  Discharge setting and therapy post discharge at home with home health is anticipated.  Patient has agreed to participate in the Acute Inpatient Rehabilitation Program and will admit today.   Preadmission Screen Completed By:  Bethel Born,  02/26/2021 3:40 PM with updates by [facility]  ______________________________________________________________________   Discussed status with Dr. Bonney Aid at 1130 on 03/01/21 and received approval for admission today.   Admission Coordinator:  Bethel Born, [place], time 1834  /Date 03/01/21    Assessment/Plan: Diagnosis:  Left pontine infarct Does the need for close, 24 hr/day Medical supervision in concert with the patient's rehab needs make it unreasonable for this patient to be served in a less intensive setting? Yes Co-Morbidities requiring supervision/potential complications: Right hemiparesis, HTN, new onset DM, hx gout Due to bladder management, bowel management, safety, skin/wound care, disease management, medication administration, pain management, and patient education, does the patient require 24 hr/day rehab nursing? Yes Does the  patient require coordinated care of a physician, rehab nurse, PT, OT, and SLP to address physical and functional deficits in the context of the above medical diagnosis(es)? Yes Addressing deficits in the following areas: balance, endurance, locomotion, strength, transferring, bowel/bladder control, bathing, dressing, feeding, grooming, toileting, cognition, and psychosocial support Can the patient actively participate in an intensive therapy program of at least 3 hrs of therapy 5 days a week? Yes The potential for patient to make measurable gains while on inpatient rehab is good Anticipated functional outcomes upon discharge from inpatient rehab: supervision and min assist PT, supervision and min assist OT, n/a SLP Estimated rehab length of stay to reach the above functional goals is: 2-3wks Anticipated discharge destination: Home 10. Overall Rehab/Functional Prognosis: excellent     MD Signature: Charlett Blake M.D. [facility] Fellow Am Acad of Phys Med and Rehab Diplomate Am Board of Electrodiagnostic Med Fellow Am Board of Interventional Pain

## 2021-03-01 NOTE — Progress Notes (Signed)
Inpatient Rehabilitation  Patient information reviewed and entered into eRehab system by Kenedi Cilia M. Garlan Drewes, M.A., CCC/SLP, PPS Coordinator.  Information including medical coding, functional ability and quality indicators will be reviewed and updated through discharge.    

## 2021-03-01 NOTE — Progress Notes (Signed)
Patient ID: Carrie Kline, female   DOB: 01-Feb-1946, 75 y.o.   MRN: [id number] Met with the patient and daughter and daughter by phone to introduce self and the role of the nurse CM. Reviewed secondary stroke risks (HTN HLD and new DM, obesity)and management with medications and dietary modifications including DASH diet, CMM diet and fat and cholesterol dietary modifications. Reviewed DAPT x 3 weeks then ASA solo. Continue to follow along to discharge to address educational needs and collaborate with the SW to facilitate preparation for discharge. Margarito Liner

## 2021-03-01 NOTE — Progress Notes (Signed)
Inpatient Rehab Admissions Coordinator:   I have a bed for this Pt. On CIR. RN may call report to [phone].  Megan Salon, MS, CCC-SLP Rehab Admissions Coordinator  [phone] (celll) [phone] (office)

## 2021-03-01 NOTE — H&P (Signed)
Physical Medicine and Rehabilitation Admission H&P        Chief Complaint  Patient presents with   Code Stroke  : HPI: Carrie Kline is a 75 year old female with history of gout and back surgery.  Per chart review she lives with her children.  Independent prior to admission continues to work.  Two-level home half bath on main level bed and bathroom upstairs.  One-step to entry.  Presented 02/24/2021 with acute onset of right side weakness and mild slurred speech.  CT/MRI showed acute infarct left pons.  Slight edema without mass-effect.  Patient did not receive tPA.  CT angiogram head and neck negative for large vessel occlusion.  Echocardiogram with ejection fraction of 55 to 60% no wall motion abnormalities.  Admission chemistries unremarkable except glucose 240, hemoglobin A1c 7.4.  Maintained on aspirin 81 mg daily and Plavix 75 mg daily for CVA prophylaxis x3 weeks then aspirin alone.  Subcutaneous Lovenox for DVT prophylaxis..  Incidental findings hospital course of thyroid nodule with ultrasound of thyroid showing parenchymal echotexture.  Recommend outpatient follow-up.  Patient is tolerating a regular consistency diet and speech therapy has signed off.  Glucophage was initiated for new finding of diabetes mellitus.  Due to patient's right side weakness and therapy evaluations completed patient was admitted for a comprehensive rehab program.   Review of Systems Constitutional:  Negative for chills and fever. HENT:  Negative for hearing loss.   Eyes:  Negative for blurred vision and double vision. Respiratory:  Negative for cough and shortness of breath.   Cardiovascular:  Negative for chest pain, palpitations and leg swelling. Gastrointestinal:  Positive for constipation. Negative for heartburn, nausea and vomiting. Genitourinary:  Negative for dysuria, flank pain and hematuria. Musculoskeletal:  Positive for joint pain and myalgias. Skin:  Negative for rash. Neurological:  Positive for  weakness.  All other systems reviewed and are negative.     Past Medical History:  Diagnosis Date   Gout     Seasonal allergies           Past Surgical History:  Procedure Laterality Date   ABDOMINAL HYSTERECTOMY       BACK SURGERY       GANGLION CYST EXCISION             Family History  Problem Relation Age of Onset   Heart attack Father     Diabetes Sister     Thyroid disease Daughter      Social History:  reports that she has never smoked. She has never used smokeless tobacco. She reports that she does not drink alcohol and does not use drugs. Allergies:       Allergies  Allergen Reactions   Darvon [Propoxyphene]        itching   Hydrocortisone        itching          Medications Prior to Admission  Medication Sig Dispense Refill   ibuprofen (ADVIL) 200 MG tablet Take 400 mg by mouth every 6 (six) hours as needed for headache.       loratadine (CLARITIN) 10 MG tablet Take 10 mg by mouth daily as needed for allergies.       naproxen sodium (ALEVE) 220 MG tablet Take 220 mg by mouth daily as needed (pain).       amoxicillin-clavulanate (AUGMENTIN) 875-125 MG tablet Take 1 tablet by mouth every 12 (twelve) hours. (Patient not taking: Reported on 02/24/2021) 14 tablet 0   benzonatate (TESSALON) 100 MG  capsule Take 1 capsule (100 mg total) by mouth 3 (three) times daily as needed for cough. (Patient not taking: Reported on 02/24/2021) 30 capsule 0   ondansetron (ZOFRAN ODT) 4 MG disintegrating tablet Take 1 tablet (4 mg total) by mouth every 6 (six) hours as needed for nausea or vomiting. (Patient not taking: Reported on 02/24/2021) 20 tablet 0      Drug Regimen Review Drug regimen was reviewed and remains appropriate with no significant issues identified   Home: Home Living Family/patient expects to be discharged to:: Private residence Living Arrangements: Alone Available Help at Discharge: Family, Available 24 hours/day Type of Home: House Home Access: Level  entry Entrance Stairs-Number of Steps: 1 Home Layout: Two level, 1/2 bath on main level Alternate Level Stairs-Number of Steps: 12 Alternate Level Stairs-Rails: Right Bathroom Shower/Tub: Engineer, manufacturing systems: Standard Bathroom Accessibility: Yes Home Equipment: None Additional Comments: Pt plans to go home with daughter to the above house at [place]  Lives With: Son   Functional History: Prior Function Level of Independence: Independent Comments: Continues to work, and independent with all mobility and ADL/IADL   Functional Status:  Mobility: Bed Mobility Overal bed mobility: Needs Assistance Bed Mobility: Supine to Sit Sidelying to sit: Mod assist, HOB elevated Sit to sidelying: Min assist General bed mobility comments: Pt able to use LLE to bridge in bed and shift hips to RT to prepare for supine to sit. Assist to advance RLE off EOB and for trunk. Transfers Overall transfer level: Needs assistance Equipment used: Ambulation equipment used, Rolling walker (2 wheeled) Transfer via Lift Equipment: Stedy Transfers: Systems analyst Sit to Stand: Min assist, +2 safety/equipment Squat pivot transfers: Max assist General transfer comment: Max Assist for pt to pivot to weaker RT side with squat/pivot technique. Cues for sequencing.   ADL: ADL Overall ADL's : Needs assistance/impaired Eating/Feeding: Set up, Bed level Grooming: Wash/dry face, Oral care, Set up, Sitting Grooming Details (indicate cue type and reason): While in recliner pt educated on 1-handed techniuqes for managing toothbrush, toothpaste and opening toothpaste bottle. Pt reported plan to use flip lid toothpaste for ease. Pt washed face and brushed teeth with setup in sitting. Upper Body Dressing : Minimal assistance, Sitting Upper Body Dressing Details (indicate cue type and reason): Pt trained on 1=handed technique for donning/doffing her own overhead t-shirt. Pt followed instructions to don over RT  hand first and able to push up to shoulder with increased time/effort. able to pull over head and thread through LT UE. Shirt stuck behind RT shoulder while pt could not move. Pt educated on possible use of dressing stick to assist over the shoulder dressing. this session needed Min As. Pt followed instruictions and doffed shirt wiht verbal cues, increased time/effort and supervision. Lower Body Dressing: Total assistance Lower Body Dressing Details (indicate cue type and reason): Pt able to bring LLE to rt knee and doffed sock with Min As and increased effort. Pt shown 1-handed technique to don sock. Pt attempted but unable to reach toes at recliner level. May have more success long sitting in bed. Currently Total Assist to don socks. Toilet Transfer: Squat-pivot, Maximal assistance Statistician Details (indicate cue type and reason): To recliner. Pt performed squat-pivot to recliner with Max As and cues for sequencing.  RT hand placed in ghait belt for protection during transfer only. Toileting - Clothing Manipulation Details (indicate cue type and reason): Currently on pure wick. Functional mobility during ADLs: Maximal assistance General ADL Comments: Pt limited  by R side weakness and decreased ability to care self.  Pt performing lap slides and using tray table to perform retraction/protraction.   Cognition: Cognition Overall Cognitive Status: Within Functional Limits for tasks assessed Arousal/Alertness: Awake/alert Orientation Level: Oriented X4 Attention: Selective Selective Attention: Appears intact Memory: Appears intact Awareness: Appears intact Cognition Arousal/Alertness: Awake/alert Behavior During Therapy: Flat affect, WFL for tasks assessed/performed Overall Cognitive Status: Within Functional Limits for tasks assessed General Comments: Pt motivated for rehab. Pt stating, "I'm going to work heard. I'm not going to be lazy. I want to walk."   Physical Exam: Blood pressure  111/71, pulse 80, temperature 98.1 F (36.7 C), temperature source Oral, resp. rate 18, SpO2 95 %. Physical Exam Neurological:    Comments: Patient is alert in no acute distress.  Makes eye contact with examiner.  Mild dysarthria and fully intelligible.  Oriented x3.  Fair insight and awareness.    General: No acute distress Mood and affect are appropriate Heart: Regular rate and rhythm no rubs murmurs or extra sounds Lungs: Clear to auscultation, breathing unlabored, no rales or wheezes Abdomen: Positive bowel sounds, soft nontender to palpation, nondistended Extremities: No clubbing, cyanosis, or edema Skin: No evidence of breakdown, no evidence of rash Neurologic: Cranial nerves II through XII intact, motor strength is 5/5 in Left deltoid, bicep, tricep, grip, hip flexor, knee extensors, ankle dorsiflexor and plantar flexor 2/5 Right delt, Bi, tri, 0/5  FF, 2/5 thumb flex, 2-/5 hip knee ext synergy o/w 0/5 RLE Sensory exam normal sensation to light touch and proprioception in bilateral upper and lower extremities Cerebellar exam normal finger to nose to finger as well as heel to shin in Left upper and lower extremities Canot do on R d/t weakness Musculoskeletal: Full range of motion in all 4 extremities. No joint swelling    Lab Results Last 48 Hours        Results for orders placed or performed during the hospital encounter of 02/24/21 (from the past 48 hour(s))  Glucose, capillary     Status: Abnormal    Collection Time: 02/27/21 11:13 AM  Result Value Ref Range    Glucose-Capillary 131 (H) 70 - 99 mg/dL      Comment: Glucose reference range applies only to samples taken after fasting for at least 8 hours.    Comment 1 Notify RN    Glucose, capillary     Status: None    Collection Time: 02/27/21  4:10 PM  Result Value Ref Range    Glucose-Capillary 89 70 - 99 mg/dL      Comment: Glucose reference range applies only to samples taken after fasting for at least 8 hours.     Comment 1 Notify RN    Glucose, capillary     Status: Abnormal    Collection Time: 02/27/21  9:09 PM  Result Value Ref Range    Glucose-Capillary 135 (H) 70 - 99 mg/dL      Comment: Glucose reference range applies only to samples taken after fasting for at least 8 hours.  Glucose, capillary     Status: Abnormal    Collection Time: 02/28/21  6:04 AM  Result Value Ref Range    Glucose-Capillary 138 (H) 70 - 99 mg/dL      Comment: Glucose reference range applies only to samples taken after fasting for at least 8 hours.  Glucose, capillary     Status: Abnormal    Collection Time: 02/28/21 10:52 AM  Result Value Ref Range  Glucose-Capillary 119 (H) 70 - 99 mg/dL      Comment: Glucose reference range applies only to samples taken after fasting for at least 8 hours.  Glucose, capillary     Status: None    Collection Time: 02/28/21  4:01 PM  Result Value Ref Range    Glucose-Capillary 92 70 - 99 mg/dL      Comment: Glucose reference range applies only to samples taken after fasting for at least 8 hours.  Glucose, capillary     Status: Abnormal    Collection Time: 02/28/21  9:07 PM  Result Value Ref Range    Glucose-Capillary 163 (H) 70 - 99 mg/dL      Comment: Glucose reference range applies only to samples taken after fasting for at least 8 hours.  Glucose, capillary     Status: Abnormal    Collection Time: 03/01/21  6:01 AM  Result Value Ref Range    Glucose-Capillary 134 (H) 70 - 99 mg/dL      Comment: Glucose reference range applies only to samples taken after fasting for at least 8 hours.  Glucose, capillary     Status: Abnormal    Collection Time: 03/01/21 11:03 AM  Result Value Ref Range    Glucose-Capillary 123 (H) 70 - 99 mg/dL      Comment: Glucose reference range applies only to samples taken after fasting for at least 8 hours.    Comment 1 Notify RN        Imaging Results (Last 48 hours)  No results found.           Medical Problem List and Plan: 1.  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  Antithrombotics: -DVT/anticoagulation: Lovenox             -antiplatelet therapy: Aspirin 81 mg daily and Plavix 75 mg daily x3 weeks and aspirin alone 3. Pain Management: Tylenol as needed 4. Mood: Provide emotional support             -antipsychotic agents: N/A 5. Neuropsych: This patient is capable of making decisions on her own behalf. 6. Skin/Wound Care: Routine skin checks 7. Fluids/Electrolytes/Nutrition: Routine in and outs with follow-up chemistries 8.  New findings diabetes mellitus.  Hemoglobin A1c 7.4.  Glucophage 500 mg daily.  Diabetic teaching 9.  Hypertension.  Lisinopril 5 mg daily 10.  Hyperlipidemia.  Lipitor 11.  Thyroid nodule.  Follow-up outpatient      Charlton Amor, PA-C 03/01/2021  "I have personally performed a face to face diagnostic evaluation of this patient.  Additionally, I have reviewed and concur with the physician assistant's documentation above." Erick Colace M.D. [facility] Fellow Am Acad of [employer] Med and Rehab Diplomate Am Board of Electrodiagnostic Med Fellow Am Board of Interventional Pain

## 2021-03-01 NOTE — Progress Notes (Signed)
Physical Therapy Treatment Patient Details Name: Carrie Kline MRN: [id number] DOB: 10-26-1945 Today's Date: 03/01/2021    History of Present Illness Pt adm 7/1 with rt sided weakness. MRI showed acute infarct in lt pons. PMH - gout.    PT Comments    Pt has just returned to bed from sitting up 3.5 hours but eager to work with therapy. Pt and daughter are excited about going to CIR for rehab. Pt with increasing R muscle activation proximal>distal, but at least flicker in all major muscle groups. Pt is currently min guard for sitting EoB, min A for sit>stand in RW, modA for lateral stepping with RW and modA for return to supine. Pt is motivated to return to [facility] and is noted to be working on [employer] of UE inbetween sessions. Feel confident that pt will make great progress in CIR.   Follow Up Recommendations  CIR     Equipment Recommendations  Wheelchair (measurements PT);Wheelchair cushion (measurements PT)       Precautions / Restrictions Precautions Precautions: Fall Precaution Comments: R hemiparesis Restrictions Weight Bearing Restrictions: No    Mobility  Bed Mobility Overal bed mobility: Needs Assistance Bed Mobility: Supine to Sit;Sit to Supine    Supine to sit: Min guard;HOB elevated Sit to supine: Mod assist   General bed mobility comments: min guard for safety, pt with increased time and effort to get to EoB utilizing hip bridging to scoot hips to EoB and then reaching across to bedrail with L UE to pull body onto side, pt slide LE off bed and L UE to bring trunk to upright, pt taught how to hook RLE underneath L ankle to assist back to bed, pt requires modA for bringing LE back into bed. Pt able to bridge hips back to center of bed    Transfers Overall transfer level: Needs assistance Equipment used: Rolling walker (2 wheeled) Transfers: Sit to/from Stand Sit to Stand: Min assist;From elevated surface        General transfer comment: vc for hand placement for  power up, min A for steadying L side and holding L hand on RW, x4  Ambulation/Gait Ambulation/Gait assistance: Mod assist Gait Distance (Feet): 2 Feet Assistive device: Rolling walker (2 wheeled) Gait Pattern/deviations: Step-to pattern;Shuffle;Decreased step length - right;Decreased stance time - left Gait velocity: slowed Gait velocity interpretation: <1.31 ft/sec, indicative of household ambulator General Gait Details: modA for steadying and facilitating weight shift for lateral stepping along side of bed, requires 2x sitting due to LoB      Modified Rankin (Stroke Patients Only) Modified Rankin (Stroke Patients Only) Pre-Morbid Rankin Score: No symptoms Modified Rankin: Severe disability     Balance Overall balance assessment: Needs assistance Sitting-balance support: No upper extremity supported;Feet unsupported Sitting balance-Leahy Scale: Good     Standing balance support: Bilateral upper extremity supported Standing balance-Leahy Scale: Poor Standing balance comment: requires L UE support and R sided support                            Cognition Arousal/Alertness: Awake/alert Behavior During Therapy: WFL for tasks assessed/performed Overall Cognitive Status: Within Functional Limits for tasks assessed                                 General Comments: excited about more ability to move her extremities      Exercises Other Exercises Other Exercises: weight shifting  with vc for glute activation to assist in steadying, x10 Other Exercises: static standing with assist to keep R UE on RW, no physical assist Other Exercises: Pt educated on positioning and care of RUE. Pt reports that she has no numbness to RUE. Educated to keep pillows under BUEs for symmetry.    General Comments General comments (skin integrity, edema, etc.): VSS      Pertinent Vitals/Pain Pain Assessment: No/denies pain     PT Goals (current goals can now be found in  the care plan section) Acute Rehab PT Goals Patient Stated Goal: go home PT Goal Formulation: With patient Time For Goal Achievement: 03/10/21 Potential to Achieve Goals: Good Progress towards PT goals: Progressing toward goals    Frequency    Min 4X/week      PT Plan Current plan remains appropriate       AM-PAC PT "6 Clicks" Mobility   Outcome Measure  Help needed turning from your back to your side while in a flat bed without using bedrails?: A Little Help needed moving from lying on your back to sitting on the side of a flat bed without using bedrails?: A Little Help needed moving to and from a bed to a chair (including a wheelchair)?: Total Help needed standing up from a chair using your arms (e.g., wheelchair or bedside chair)?: Total Help needed to walk in hospital room?: Total Help needed climbing 3-5 steps with a railing? : Total 6 Click Score: 10    End of Session Equipment Utilized During Treatment: Gait belt Activity Tolerance: Patient tolerated treatment well Patient left: in bed;with call bell/phone within reach;with family/visitor present;with bed alarm set Nurse Communication: Mobility status PT Visit Diagnosis: Hemiplegia and hemiparesis;Other abnormalities of gait and mobility (R26.89) Hemiplegia - Right/Left: Right Hemiplegia - dominant/non-dominant: Non-dominant Hemiplegia - caused by: Cerebral infarction     Time: 1212-1236 PT Time Calculation (min) (ACUTE ONLY): 24 min  Charges:  $Therapeutic Exercise: 8-22 mins $Therapeutic Activity: 8-22 mins                     Noam Karaffa B. Beverely Risen PT, DPT Acute Rehabilitation Services Pager [phone] Office [phone]    [name] Fleet 03/01/2021, 12:57 PM

## 2021-03-01 NOTE — Progress Notes (Signed)
HD#5 Subjective:   Patient continues to see improvement in her arm mobility, says that she is able to wiggle her thumb and keep her arm upright in bed. Will continue to work hard with PT.  Objective:  Vital signs in last 24 hours: Vitals:   02/28/21 1646 02/28/21 1955 02/28/21 2328 03/01/21 0428  BP: (!) 160/90 131/66 118/71 111/71  Pulse: 83 80 78 80  Resp: 18 20 18 18   Temp: 98.6 F (37 C) 97.8 F (36.6 C) 98.3 F (36.8 C) 98.1 F (36.7 C)  TempSrc: Oral Oral Oral Oral  SpO2: 100% 100% 97% 95%   Supplemental O2: Room Air SpO2: 95 %   Physical Exam:  Constitutional: elderly woman laying in bed, in no acute distress Pulmonary/Chest: normal work of breathing on room air MSK: normal bulk and tone Neurological: alert & oriented x 3, trace movement in right upper and lower extremities, facial droop present on the right side, sensation improving on the right Skin: warm and dry   There were no vitals filed for this visit.   Intake/Output Summary (Last 24 hours) at 03/01/2021 0719 Last data filed at 03/01/2021 05/02/2021 Gross per 24 hour  Intake 240 ml  Output 550 ml  Net -310 ml   Net IO Since Admission: -2,510 mL [03/01/21 0719]  Pertinent Labs: CBC Latest Ref Rng & Units 02/25/2021 02/24/2021 02/24/2021  WBC 4.0 - 10.5 K/uL 7.0 - 6.6  Hemoglobin 12.0 - 15.0 g/dL 04/27/2021 67.3 41.9  Hematocrit 36.0 - 46.0 % 37.6 38.0 38.4  Platelets 150 - 400 K/uL 201 - 193    CMP Latest Ref Rng & Units 02/25/2021 02/24/2021 02/24/2021  Glucose 70 - 99 mg/dL 04/27/2021) 024(O) 973(Z)  BUN 8 - 23 mg/dL 19 329(J) 23  Creatinine 0.44 - 1.00 mg/dL 24(Q 6.83 4.19  Sodium 135 - 145 mmol/L 136 141 137  Potassium 3.5 - 5.1 mmol/L 3.7 4.0 4.0  Chloride 98 - 111 mmol/L 105 107 107  CO2 22 - 32 mmol/L 24 - 24  Calcium 8.9 - 10.3 mg/dL 9.4 - 9.3  Total Protein 6.5 - 8.1 g/dL - - 7.6  Total Bilirubin 0.3 - 1.2 mg/dL - - 0.7  Alkaline Phos 38 - 126 U/L - - 88  AST 15 - 41 U/L - - 19  ALT 0 - 44 U/L - - 15     Imaging: No results found.  Assessment/Plan:   Principal Problem:   Acute cerebral infarction [facility]) Active Problems:   Thyroid nodule   Diabetes (HCC)   Patient Summary: Carrie Kline is a 75 y.o. with a pertinent history of gout allergic and rhinitis who presented to the ED with right-sided weakness and dysarthria and was admitted for acute left pons infarct.   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  MRI findings showing acute infarction of the left pons likely thrombotic. Physical exam findings show right upper and lower extremity strength of 0/5 with trace movement. - Continue aspirin 81 mg daily - Continue clopidogrel 75 mg daily for 3 weeks, started 7/1, end 7/22 - Continue to work with PT    Newly Diagnosed Diabetes: A1c of 7.1 - Start metformin 500 mg daily - insulin d/c on 7/6   Hyperlipidemia:  Lipid panel showing a total cholesterol 177 with an LDL of 121 ASCVD risk of 29%. - Continue Lipitor 40 mg daily   Hypertension: -  Continue Lisinopril 5 mg daily  Thyroid Nodule: -will require outpatient workup   Diffuse idiopathic skeletal hyperostosis: Continue to monitor for symptoms  Prior to Admission Living Arrangement: Home Anticipated Discharge Location: CIR Barriers to Discharge: Medically stable pending placement Dispo: Anticipated discharge to 1-2 days  Ilene Qua, MD Internal Medicine Resident PGY-1 Pager [phone] Please contact the on call pager after 5 pm and on weekends at [phone].

## 2021-03-01 NOTE — Progress Notes (Signed)
Occupational Therapy Treatment Patient Details Name: Carrie Kline MRN: [id number] DOB: Jul 17, 1946 Today's Date: 03/01/2021    History of present illness Pt adm 7/1 with rt sided weakness. 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 PMH - gout.   OT comments  Patient progressing and showed improved ability to perform UE dressing and seated grooming with use of 1-handed techniques which pt was able to demonstrate back, compared to previous session where pt required increased assistance and at bed level. 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 Pt continues to demonstrate good rehab potential , fair to meet her goal of resuming walking unassisted, and would benefit from continued skilled OT to increase safety and independence with ADLs and functional transfers to allow pt to return home safely and reduce caregiver burden and fall risk. Pt is highly motivated and cooperative with therapy this morning.    Follow Up Recommendations  CIR    Equipment Recommendations  Wheelchair (measurements OT);Wheelchair cushion (measurements OT);3 in 1 bedside commode    Recommendations for Other Services      Precautions / Restrictions Precautions Precautions: Fall Precaution Comments: R hemiparesis Restrictions Weight Bearing Restrictions: No       Mobility Bed Mobility Overal bed mobility: Needs Assistance Bed Mobility: Supine to Sit   Sidelying to sit: Mod assist;HOB elevated       General bed mobility comments: Pt able to use LLE to bridge in bed and shift hips to RT to prepare for supine to sit. Assist to advance RLE off EOB and for trunk.    Transfers Overall transfer level: Needs assistance   Transfers: Squat Pivot Transfers     Squat pivot transfers: Max assist     General transfer comment: Max Assist for pt to pivot to weaker RT side with squat/pivot technique. Cues for sequencing.    Balance Overall balance assessment:  Needs assistance Sitting-balance support: No upper extremity supported;Feet supported Sitting balance-Leahy Scale: Normal       Standing balance-Leahy Scale: Poor                             ADL either performed or assessed with clinical judgement   ADL Overall ADL's : Needs assistance/impaired     Grooming: Wash/dry face;Oral care;Set up;Sitting Grooming Details (indicate cue type and reason): While in recliner pt educated on 1-handed techniuqes for managing toothbrush, toothpaste and opening toothpaste bottle. Pt reported plan to use flip lid toothpaste for ease. Pt washed face and brushed teeth with setup in sitting.         Upper Body Dressing : Minimal assistance;Sitting Upper Body Dressing Details (indicate cue type and reason): Pt trained on 1=handed technique for donning/doffing her own overhead t-shirt. Pt followed instructions to don over RT hand first and able to push up to shoulder with increased time/effort. able to pull over head and thread through LT UE. Shirt stuck behind RT shoulder while pt could not move. Pt educated on possible use of dressing stick to assist over the shoulder dressing. this session needed Min As. Pt followed instruictions and doffed shirt wiht verbal cues, increased time/effort and supervision. Lower Body Dressing: Total assistance Lower Body Dressing Details (indicate cue type and reason): Pt able to bring LLE to rt knee and doffed sock with Min As and increased effort. Pt shown 1-handed technique to don sock. Pt attempted but unable to reach toes at recliner level. May  have more success long sitting in bed. Currently Total Assist to don socks. Toilet Transfer: Radiation protection practitioner Details (indicate cue type and reason): To recliner. Pt performed squat-pivot to recliner with Max As and cues for sequencing.  RT hand placed in ghait belt for protection during transfer only.   Toileting - Clothing Manipulation  Details (indicate cue type and reason): Currently on pure wick.     Functional mobility during ADLs: Maximal assistance       Vision Baseline Vision/History: Wears glasses Wears Glasses: At all times Patient Visual Report: No change from baseline Vision Assessment?: No apparent visual deficits   Perception     Praxis      Cognition Arousal/Alertness: Awake/alert Behavior During Therapy: Flat affect;WFL for tasks assessed/performed Overall Cognitive Status: Within Functional Limits for tasks assessed                                 General Comments: Pt motivated for rehab. Pt stating, "I'm going to work heard. I'm not going to be lazy. I want to walk."        Exercises Other Exercises Other Exercises: Pt educated on positioning and care of RUE. Pt reports that she has no numbness to RUE. Educated to keep pillows under BUEs for symmetry.   Shoulder Instructions       General Comments      Pertinent Vitals/ Pain       Pain Assessment: No/denies pain  Home Living                                          Prior Functioning/Environment              Frequency  Min 2X/week        Progress Toward Goals  OT Goals(current goals can now be found in the care plan section)  Progress towards OT goals: Progressing toward goals  Acute Rehab OT Goals Patient Stated Goal: To walk OT Goal Formulation: With patient Time For Goal Achievement: 03/10/21 Potential to Achieve Goals: Fair  Plan Discharge plan remains appropriate    Co-evaluation                 AM-PAC OT "6 Clicks" Daily Activity     Outcome Measure   Help from another person eating meals?: A Little Help from another person taking care of personal grooming?: A Little Help from another person toileting, which includes using toliet, bedpan, or urinal?: A Lot Help from another person bathing (including washing, rinsing, drying)?: A Lot Help from another person to  put on and taking off regular upper body clothing?: A Little (Could benefit from dressing stick.) Help from another person to put on and taking off regular lower body clothing?: Total 6 Click Score: 14    End of Session Equipment Utilized During Treatment: Gait belt  OT Visit Diagnosis: Unsteadiness on feet (R26.81);Muscle weakness (generalized) (M62.81);Hemiplegia and hemiparesis Hemiplegia - Right/Left: Right Hemiplegia - dominant/non-dominant: Non-Dominant Hemiplegia - caused by: Cerebral infarction   Activity Tolerance Patient tolerated treatment well   Patient Left in chair;with call bell/phone within reach;with chair alarm set   Nurse Communication Mobility status        Time: 1610-9604 OT Time Calculation (min): 34 min  Charges: OT General Charges $OT Visit: 1 Visit OT Treatments $Self Care/Home  Management : 8-22 mins $Therapeutic Activity: 8-22 mins  Victorino Dike, OT Acute Rehab Services Office: [phone] 03/01/2021   Theodoro Clock 03/01/2021, 10:36 AM

## 2021-03-01 NOTE — Discharge Instructions (Addendum)
Inpatient Rehab Discharge Instructions  Carrie Kline Discharge date and time: No discharge date for patient encounter.   Activities/Precautions/ Functional Status: Activity: activity as tolerated Diet: diabetic diet Wound Care: Routine skin checks Functional status:  ___ No restrictions     ___ Walk up steps independently ___ 24/7 supervision/assistance   ___ Walk up steps with assistance ___ Intermittent supervision/assistance  ___ Bathe/dress independently ___ Walk with walker     __x_ Bathe/dress with assistance ___ Walk Independently    ___ Shower independently ___ Walk with assistance    ___ Shower with assistance ___ No alcohol     ___ Return to work/school ________  COMMUNITY REFERRALS UPON DISCHARGE:    Outpatient: PT     OT                Agency: [facility]  Phone: [phone]              Appointment Date/Time: TBD  Medical Equipment/Items Ordered: [name], Bedside Commode, Transfer Bench                                                 Agency/Supplier: [employer]    Special Instructions: New Patient Visit with Rema Fendt on Thursday May 11, 2021 at 8:30 A.M. at  Primary Care at [facility]: [address]. [place] [address] [phone]  No driving smoking or alcohol  STROKE/TIA DISCHARGE INSTRUCTIONS SMOKING Cigarette smoking nearly doubles your risk of having a stroke & is the single most alterable risk factor  If you smoke or have smoked in the last 12 months, you are advised to quit smoking for your health. Most of the excess cardiovascular risk related to smoking disappears within a year of stopping. Ask you doctor about anti-smoking medications [facility] Quit Line: 1-800-QUIT NOW Free Smoking Cessation Classes [phone]  CHOLESTEROL Know your levels; limit fat & cholesterol in your diet  Lipid Panel     Component Value Date/Time   CHOL 171 02/24/2021 1203   TRIG 80 02/24/2021 1203   HDL 34 (L) 02/24/2021 1203   CHOLHDL 5.0  02/24/2021 1203   VLDL 16 02/24/2021 1203   LDLCALC 121 (H) 02/24/2021 1203     Many patients benefit from treatment even if their cholesterol is at goal. Goal: Total Cholesterol (CHOL) less than 160 Goal:  Triglycerides (TRIG) less than 150 Goal:  HDL greater than 40 Goal:  LDL (LDLCALC) less than 100   BLOOD PRESSURE American Stroke Association blood pressure target is less that 120/80 mm/Hg  Your discharge blood pressure is:  BP: 124/75 Monitor your blood pressure Limit your salt and alcohol intake Many individuals will require more than one medication for high blood pressure  DIABETES (A1c is a blood sugar average for last 3 months) Goal HGBA1c is under 7% (HBGA1c is blood sugar average for last 3 months)  Diabetes:    Lab Results  Component Value Date   HGBA1C 7.4 (H) 02/24/2021    Your HGBA1c can be lowered with medications, healthy diet, and exercise. Check your blood sugar as directed by your physician Call your physician if you experience unexplained or low blood sugars.  PHYSICAL ACTIVITY/REHABILITATION Goal is 30 minutes at least 4 days per week  Activity: Increase activity slowly, Therapies: Physical Therapy: Home Health Return to work:  Activity decreases your risk  of heart attack and stroke and makes your heart stronger.  It helps control your weight and blood pressure; helps you relax and can improve your mood. Participate in a regular exercise program. Talk with your doctor about the best form of exercise for you (dancing, walking, swimming, cycling).  DIET/WEIGHT Goal is to maintain a healthy weight  Your discharge diet is:  Diet Order             Diet Carb Modified Fluid consistency: Thin; Room service appropriate? Yes  Diet effective now                   liquids Your height is:  Height: 5\' 3"  (160 cm) Your current weight is: Weight: 90 kg Your Body Mass Index (BMI) is:  BMI (Calculated): 35.16 Following the type of diet specifically designed for you  will help prevent another stroke. Your goal weight range is:   Your goal Body Mass Index (BMI) is 19-24. Healthy food habits can help reduce 3 risk factors for stroke:  High cholesterol, hypertension, and excess weight.  RESOURCES Stroke/Support Group:  Call [phone]   STROKE EDUCATION PROVIDED/REVIEWED AND GIVEN TO PATIENT Stroke warning signs and symptoms How to activate emergency medical system (call 911). Medications prescribed at discharge. Need for follow-up after discharge. Personal risk factors for stroke. Pneumonia vaccine given: No Flu vaccine given: No My questions have been answered, the writing is legible, and I understand these instructions.  I will adhere to these goals & educational materials that have been provided to me after my discharge from the hospital.       My questions have been answered and I understand these instructions. I will adhere to these goals and the provided educational materials after my discharge from the hospital.  Patient/Caregiver Signature _______________________________ Date __________  Clinician Signature _______________________________________ Date __________  Please bring this form and your medication list with you to all your follow-up doctor's appointments.

## 2021-03-02 LAB — COMPREHENSIVE METABOLIC PANEL
ALT: 20 U/L (ref 0–44)
AST: 21 U/L (ref 15–41)
Albumin: 3.6 g/dL (ref 3.5–5.0)
Alkaline Phosphatase: 87 U/L (ref 38–126)
Anion gap: 9 (ref 5–15)
BUN: 27 mg/dL — ABNORMAL HIGH (ref 8–23)
CO2: 22 mmol/L (ref 22–32)
Calcium: 9.6 mg/dL (ref 8.9–10.3)
Chloride: 103 mmol/L (ref 98–111)
Creatinine, Ser: 1.1 mg/dL — ABNORMAL HIGH (ref 0.44–1.00)
GFR, Estimated: 53 mL/min — ABNORMAL LOW (ref >60)
Glucose, Bld: 162 mg/dL — ABNORMAL HIGH (ref 70–99)
Potassium: 3.9 mmol/L (ref 3.5–5.1)
Sodium: 134 mmol/L — ABNORMAL LOW (ref 135–145)
Total Bilirubin: 0.8 mg/dL (ref 0.3–1.2)
Total Protein: 7.9 g/dL (ref 6.5–8.1)

## 2021-03-02 LAB — CBC
HCT: 41 % (ref 36.0–46.0)
Hemoglobin: 13.6 g/dL (ref 12.0–15.0)
MCH: 28.5 pg (ref 26.0–34.0)
MCHC: 33.2 g/dL (ref 30.0–36.0)
MCV: 86 fL (ref 80.0–100.0)
Platelets: 225 10*3/uL (ref 150–400)
RBC: 4.77 MIL/uL (ref 3.87–5.11)
RDW: 12.7 % (ref 11.5–15.5)
WBC: 7.5 10*3/uL (ref 4.0–10.5)
nRBC: 0 % (ref 0.0–0.2)

## 2021-03-02 LAB — GLUCOSE, CAPILLARY
Glucose-Capillary: 140 mg/dL — ABNORMAL HIGH (ref 70–99)
Glucose-Capillary: 127 mg/dL — ABNORMAL HIGH (ref 70–99)
Glucose-Capillary: 104 mg/dL — ABNORMAL HIGH (ref 70–99)
Glucose-Capillary: 105 mg/dL — ABNORMAL HIGH (ref 70–99)

## 2021-03-02 MED ORDER — ONDANSETRON HCL 4 MG PO TABS
4.0000 mg | ORAL_TABLET | Freq: Three times a day (TID) | ORAL | Status: DC | PRN
  Administered 2021-03-02 – 2021-03-03 (×2): 4 mg via ORAL
  Filled 2021-03-02 (×2): qty 1

## 2021-03-02 NOTE — Progress Notes (Signed)
PROGRESS NOTE   Subjective/Complaints:  Dressed herself with OT today   No pain c/os  ROS- neg CP, SOB, N/V/D  Objective:   No results found. No results for input(s): WBC, HGB, HCT, PLT in the last 72 hours. No results for input(s): NA, K, CL, CO2, GLUCOSE, BUN, CREATININE, CALCIUM in the last 72 hours.  Intake/Output Summary (Last 24 hours) at 03/02/2021 0919 Last data filed at 03/02/2021 0738 Gross per 24 hour  Intake 440 ml  Output --  Net 440 ml        Physical Exam: Vital Signs Blood pressure 130/72, pulse 79, temperature 97.9 F (36.6 C), resp. rate 18, height 5\' 3"  (1.6 m), weight 90 kg, SpO2 98 %.  General: No acute distress Mood and affect are appropriate Heart: Regular rate and rhythm no rubs murmurs or extra sounds Lungs: Clear to auscultation, breathing unlabored, no rales or wheezes Abdomen: Positive bowel sounds, soft nontender to palpation, nondistended Extremities: No clubbing, cyanosis, or edema Skin: No evidence of breakdown, no evidence of rash Neurologic: Cranial nerves II through XII intact, motor strength is 5/5 in left deltoid, bicep, tricep, grip, hip flexor, knee extensors, ankle dorsiflexor and plantar flexor 2/5 RUE bi, tri, 0/5 finger and wrist , 2- R thumb flexion and R hip /knee ext synergy, 0/5 distal RLE Sensory exam normal sensation to light touch and proprioception in bilateral upper and lower extremities  Musculoskeletal: Full range of motion in all 4 extremities. No joint swelling    Assessment/Plan: 1. Functional deficits which require 3+ hours per day of interdisciplinary therapy in a comprehensive inpatient rehab setting. Physiatrist is providing close team supervision and 24 hour management of active medical problems listed below. Physiatrist and rehab team continue to assess barriers to discharge/monitor patient progress toward functional and medical goals  Care  Tool:  Bathing              Bathing assist       Upper Body Dressing/Undressing Upper body dressing        Upper body assist Assist Level: Minimal Assistance - Patient > 75%    Lower Body Dressing/Undressing Lower body dressing      What is the patient wearing?: Incontinence brief     Lower body assist Assist for lower body dressing: Minimal Assistance - Patient > 75%     Toileting Toileting    Toileting assist Assist for toileting: Minimal Assistance - Patient > 75%     Transfers Chair/bed transfer  Transfers assist           Locomotion Ambulation   Ambulation assist              Walk 10 feet activity   Assist           Walk 50 feet activity   Assist           Walk 150 feet activity   Assist           Walk 10 feet on uneven surface  activity   Assist           Wheelchair     Assist  Wheelchair 50 feet with 2 turns activity    Assist            Wheelchair 150 feet activity     Assist          Blood pressure 130/72, pulse 79, temperature 97.9 F (36.6 C), resp. rate 18, height 5\' 3"  (1.6 m), weight 90 kg, SpO2 98 %.  Medical Problem List and Plan: 1.  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  Antithrombotics: -DVT/anticoagulation: Lovenox             -antiplatelet therapy: Aspirin 81 mg daily and Plavix 75 mg daily x3 weeks and aspirin alone 3. Pain Management: Tylenol as needed 4. Mood: Provide emotional support             -antipsychotic agents: N/A 5. Neuropsych: This patient is capable of making decisions on her own behalf. 6. Skin/Wound Care: Routine skin checks 7. Fluids/Electrolytes/Nutrition: Routine in and outs with follow-up chemistries 8.  New findings diabetes mellitus.  Hemoglobin A1c 7.4.  Glucophage 500 mg daily.  Diabetic teaching CBG  (last 3)  Recent Labs    03/01/21 1625 03/01/21 2115 03/02/21 0603  GLUCAP 119* 116* 140*  Controlled 7/7  9.  Hypertension.  Lisinopril 5 mg daily Vitals:   03/01/21 1928 03/02/21 0526  BP: 125/72 130/72  Pulse: 67 79  Resp: 17 18  Temp: 98.2 F (36.8 C) 97.9 F (36.6 C)  SpO2: 97% 98%   Controlled 7/7 10.  Hyperlipidemia.  Lipitor 11.  Thyroid nodule.  Follow-up outpatient    LOS: 1 days A FACE TO FACE EVALUATION WAS PERFORMED  05/03/21 03/02/2021, 9:19 AM

## 2021-03-02 NOTE — Progress Notes (Signed)
Pt c/o of having very loose stools. 5 times today. On call provider pamela love called, orders to change patients diet to liquid only, and monitor patient.

## 2021-03-02 NOTE — Evaluation (Signed)
Occupational Therapy Assessment and Plan  Patient Details  Name: Carrie Kline MRN: [id number] Date of Birth: 1946-06-08  OT Diagnosis: abnormal posture, hemiplegia affecting non-dominant side, and muscle weakness (generalized) Rehab Potential: Rehab Potential (ACUTE ONLY): Excellent ELOS: 18-21 days   Today's Date: 03/02/2021 OT Individual Time: 0801-0900 OT Individual Time Calculation (min): 59 min     Hospital Problem: Principal Problem:   Brainstem infarct, acute ([place])   Past Medical History:  Past Medical History:  Diagnosis Date   Gout    Seasonal allergies    Past Surgical History:  Past Surgical History:  Procedure Laterality Date   ABDOMINAL HYSTERECTOMY     BACK SURGERY     GANGLION CYST EXCISION      Assessment & Plan Clinical Impression: Patient is a 74 y.o. year old female with recent admission to the hospital on 02/24/2021 with acute onset of right side weakness and mild slurred speech.  CT/MRI showed acute infarct left pons.  Patient transferred to CIR on 03/01/2021 .    Patient currently requires max with basic self-care skills secondary to muscle weakness and muscle paralysis, impaired timing and sequencing, unbalanced muscle activation, and decreased coordination, decreased visual motor skills, and decreased sitting balance, decreased standing balance, decreased postural control, hemiplegia, and decreased balance strategies.  Prior to hospitalization, patient could complete ADLs with independent .  Patient will benefit from skilled intervention to decrease level of assist with basic self-care skills and increase independence with basic self-care skills prior to discharge home with care partner.  Anticipate patient will require minimal physical assistance and follow up outpatient.  OT - End of Session Activity Tolerance: Decreased this session Endurance Deficit: Yes OT Assessment Rehab Potential (ACUTE ONLY): Excellent OT Patient demonstrates impairments in the  following area(s): Balance;Endurance;Motor OT Basic ADL's Functional Problem(s): Eating;Grooming;Bathing;Dressing;Toileting OT Advanced ADL's Functional Problem(s): Simple Meal Preparation OT Transfers Functional Problem(s): Toilet;Tub/Shower OT Additional Impairment(s): Fuctional Use of Upper Extremity OT Plan OT Intensity: Minimum of 1-2 x/day, 45 to 90 minutes OT Frequency: 5 out of 7 days OT Duration/Estimated Length of Stay: 18-21 days OT Treatment/Interventions: Balance/vestibular training;Community reintegration;DME/adaptive equipment instruction;Cognitive remediation/compensation;Discharge planning;Functional electrical stimulation;Pain management;Self Care/advanced ADL retraining;Therapeutic Activities;UE/LE Coordination activities;Therapeutic Exercise;Patient/family education;Functional mobility training;Neuromuscular re-education;Psychosocial support;Splinting/orthotics;Wheelchair propulsion/positioning;UE/LE Strength taining/ROM;Visual/perceptual remediation/compensation OT Self Feeding Anticipated Outcome(s): modified independent OT Basic Self-Care Anticipated Outcome(s): supervision to min guard assist OT Toileting Anticipated Outcome(s): min guard assist OT Bathroom Transfers Anticipated Outcome(s): min guard assist OT Recommendation Recommendations for Other Services: Therapeutic Recreation consult Therapeutic Recreation Interventions: Stress management Patient destination: Home Follow Up Recommendations: 24 hour supervision/assistance;Outpatient OT Equipment Recommended: To be determined   OT Evaluation Precautions/Restrictions  Precautions Precautions: Fall Precaution Comments: R hemiparesis Restrictions Weight Bearing Restrictions: No   Pain  No report of pain  Home Living/Prior Functioning Home Living Living Arrangements: Children Available Help at Discharge: Family, Available 24 hours/day Type of Home: House Home Access: Stairs to enter [employer] of Steps: 1 Home Layout: Two level, 1/2 bath on main level, Bed/bath upstairs Alternate Level Stairs-Number of Steps: 12 Alternate Level Stairs-Rails: Right Bathroom Shower/Tub: Tub/shower unit (on second floor) Bathroom Toilet: Standard Bathroom Accessibility: Yes Additional Comments: Pt plans to go home with daughter to the above house at [place]; no grab bars in bathroom and only half bath on the main floor.  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 Comments: Pt states her biggest goal is to get back to walking and return to "as normal as possibe" Vision Baseline Vision/History: Wears glasses Wears Glasses: At all times Patient Visual Report: No change from baseline Vision Assessment?: Yes Eye Alignment: Within Functional Limits Ocular Range of Motion: Restricted on the right Alignment/Gaze Preference: Within Defined Limits Tracking/Visual Pursuits: Decreased smoothness of horizontal tracking;Decreased smoothness of vertical tracking;Other (comment) (decreased tracking laterally to the right with the right eye noted) Convergence: Within functional limits Visual Fields: No apparent deficits Perception  Perception: Within Functional Limits Praxis Praxis: Intact Cognition Overall Cognitive Status: Within Functional Limits for tasks assessed Arousal/Alertness: Awake/alert Orientation Level: Place;Situation;Person Person:  Oriented Place: Oriented Situation: Oriented Year: 2022 Month: July Day of Week: Correct Memory: Appears intact Immediate Memory Recall: Sock;Blue;Bed Memory Recall Sock: Without Cue Memory Recall Blue: Without Cue Memory Recall Bed: Without Cue Attention: Sustained;Selective Selective Attention: Appears intact Awareness: Appears intact Safety/Judgment: Appears intact Sensation Sensation Light Touch: Appears Intact Hot/Cold: Appears Intact Proprioception: Appears Intact Stereognosis: Not tested Additional Comments: Pt with sensation intact in BUEs Coordination Gross Motor Movements are Fluid and Coordinated: No Fine Motor Movements are Fluid and Coordinated: No Coordination and Movement Description: Pt with right hemiparesis with only trace movement noted in her shoulder and hand.  Needs max hand over hand for use as a stabilizer currently. Motor  Motor Motor: Hemiplegia Motor - Skilled Clinical Observations: R Hemiplegia UE>LE  Trunk/Postural Assessment  Cervical Assessment Cervical Assessment: Within Functional Limits Thoracic Assessment Thoracic Assessment: Exceptions to [employer] (slight thoracic rounding) Lumbar Assessment Lumbar Assessment: Exceptions to [facility] (posterior pelvic tilt) Postural Control Postural Control: Deficits on evaluation Righting Reactions: posterior and right LOB when attempting to cross the RLE over the left knee  Balance Balance Balance Assessed: Yes Static Sitting Balance Static Sitting - Balance Support: No upper extremity supported;Feet supported Static Sitting - Level of Assistance: 5: Stand by assistance Dynamic Sitting Balance Dynamic Sitting - Balance Support: Feet unsupported;During functional activity Dynamic Sitting - Level of Assistance: 3: Mod assist Static Standing Balance Static Standing - Balance Support: During functional activity;No upper extremity supported Static Standing - Level of Assistance: 3: Mod assist Dynamic Standing  Balance Dynamic Standing - Balance Support: During functional activity;No upper extremity supported Dynamic Standing - Level of Assistance: 2: Max assist (functional stepping without assistive device) Extremity/Trunk Assessment RUE Assessment RUE Assessment: Exceptions to [facility] Passive Range of Motion (PROM) Comments: WFLS Active Range of Motion (AROM) Comments: Brunnstrum stage II in the arm and hand General Strength Comments: Trace shoulder movement noted with adduction as well as trace thumb flexion.  No other noted movement at this time.  She needs max hand over hand assist to integrate as a stabilizer. LUE Assessment LUE Assessment: Within Functional Limits  Care Tool Care Tool Self Care Eating   Eating Assist Level: Set up assist    Oral Care    Oral Care Assist Level: Set up assist    Bathing   Body parts bathed by patient: Chest;Abdomen;Right upper leg;Left upper leg;Face Body parts bathed by helper: Front perineal area;Buttocks;Left arm;Right arm;Right lower leg;Left lower leg   Assist Level: Maximal Assistance - Patient 24 - 49%    Upper Body Dressing(including orthotics)   What is the  patient wearing?: Pull over shirt   Assist Level: Contact Guard/Touching assist    Lower Body Dressing (excluding footwear)   What is the patient wearing?: Incontinence brief;Pants Assist for lower body dressing: Maximal Assistance - Patient 25 - 49%    Putting on/Taking off footwear   What is the patient wearing?: Non-skid slipper socks Assist for footwear: Total Assistance - Patient < 25%       Care Tool Toileting Toileting activity   Assist for toileting: Maximal Assistance - Patient 25 - 49%     Care Tool Bed Mobility Roll left and right activity   Roll left and right assist level: Moderate Assistance - Patient 50 - 74%    Sit to lying activity   Sit to lying assist level: Moderate Assistance - Patient 50 - 74%    Lying to sitting edge of bed activity   Lying to sitting  edge of bed assist level: Moderate Assistance - Patient 50 - 74%     Care Tool Transfers Sit to stand transfer   Sit to stand assist level: Maximal Assistance - Patient 25 - 49%    Chair/bed transfer   Chair/bed transfer assist level: Maximal Assistance - Patient 25 - 49% (stand pivot)     Toilet transfer   Assist Level: Maximal Assistance - Patient 24 - 49% (stand pivot)     Care Tool Cognition Expression of Ideas and Wants Expression of Ideas and Wants: Without difficulty (complex and basic) - expresses complex messages without difficulty and with speech that is clear and easy to understand   Understanding Verbal and Non-Verbal Content Understanding Verbal and Non-Verbal Content: Understands (complex and basic) - clear comprehension without cues or repetitions   Memory/Recall Ability *first 3 days only Memory/Recall Ability *first 3 days only: Current season;Location of own room;Staff names and faces;That he or she is in a hospital/hospital unit    Refer to Care Plan for [place] 1 OT Short Term Goal 1 (Week 1): Pt will complete LB bathing with mod assist sit to stand for two consecutive sessions. OT Short Term Goal 2 (Week 1): Pt will complete LB dressing with mod assist for donning underpants and pants sit to stand. OT Short Term Goal 3 (Week 1): Pt will complete stand pivot transfer to the 3:1 with mod assist. OT Short Term Goal 4 (Week 1): Pt will use the RUE as a stabilizer with mod assist.  Recommendations for other services: Surveyor, mining group, Stress management, and Outing/community reintegration   Skilled Therapeutic Intervention ADL ADL Eating: Supervision/safety Where Assessed-Eating: Wheelchair Grooming: Supervision/safety Where Assessed-Grooming: Clinical biochemist Bathing: Minimal assistance Where Assessed-Upper Body Bathing: Edge of bed Lower Body Bathing: Maximal assistance Where Assessed-Lower Body  Bathing: Edge of bed Upper Body Dressing: Contact guard Where Assessed-Upper Body Dressing: Edge of bed Lower Body Dressing: Maximal assistance Where Assessed-Lower Body Dressing: Edge of bed Toileting: Maximal assistance Where Assessed-Toileting: Bedside Commode Toilet Transfer: Maximal assistance Toilet Transfer Method: Stand pivot Science writer: Radiographer, therapeutic: Not assessed Social research officer, government: Not assessed Mobility  Bed Mobility Rolling Right: Minimal Assistance - Patient > 75% Transfers Sit to Stand: Moderate Assistance - Patient 50-74% Stand to Sit: Moderate Assistance - Patient 50-74%  Session 1: (2585-2778)  Pt in bed to start session agreeable to completion of selfcare tasks at EOB.  Mod assist for transfer to sitting on the right side with max assist to transition from right sidelying to  sitting.  Once up she was able to maintain sitting balance with supervision.  Worked on bathing with min assist for UB secondary to not being able to use the RUE to wash the left arm or hold items to be opened.  She needed mod assist for sit to stand with washing her LB.  Max assist was needed to complete crossing the LLE over the right knee and maintaining in order to wash it as well as for dressing tasks.  Increased LOB to the right and posteriorly as well when attempting to cross the RLE over the left knee secondary to decreased flexibility and decreased dynamic sitting balance.  Mod assist to maintain balance.  She was unable to reach to the RLE as well to donn her gripper socks.  Max assist was needed for stand pivot transfer to the wheelchair with max assist as well for taking 2-3 steps forward away from the wheelchair.  Decreased ability to maintain right knee and hip stability while taking steps with the left.  Finished session with pt in the wheelchair and the call button and phone in reach.  RUE supported on half lap tray as well.    Session 2: (1304-1400)   Pt in bed with request to complete toilet transfer.  She needed mod assist for supine to sit with max assist for stand pivot transfer to the wheelchair and to the 3:1 over the toilet.  She demonstrated slight bowel incontinence before reaching the 3:1.  Max assist was needed for removal of soiled clothing and for donning new brief and paper scrub pants.  She was able to stand with mod assist with use of the grab bar intermittently to complete toilet hygiene.  Once complete she needed mod assist for washing hands at the sink from the wheelchair.  Educated pt on positioning in the bed and provided handout placed above the bed for reference.  Also provided handout and education on self assist PROM exercises for the RUE to be completed daily when not in therapy.  She was able to return demonstrate them with min assist overall.  Finished session with pt remaining in the wheelchair and with the call button and phone in reach.  Safety belt in place and half lap tray supporting the RUE.     Discharge Criteria: Patient will be discharged from OT if patient refuses treatment 3 consecutive times without medical reason, if treatment goals not met, if there is a change in medical status, if patient makes no progress towards goals or if patient is discharged from hospital.  The above assessment, treatment plan, treatment alternatives and goals were discussed and mutually agreed upon: by patient  Karagan Lehr OTR/L 03/02/2021, 5:11 PM

## 2021-03-02 NOTE — Evaluation (Signed)
Physical Therapy Assessment and Plan  Patient Details  Name: Carrie Kline MRN: [id number] Date of Birth: 08-17-1946  PT Diagnosis: Abnormality of gait, Coordination disorder, Difficulty walking, Dizziness and giddiness, Hemiparesis non-dominant, and Muscle weakness Rehab Potential: Good ELOS: ~3.5 weeks   Today's Date: 03/02/2021 PT Individual Time: 1006-1108 PT Individual Time Calculation (min): 33 min    Hospital Problem: Principal Problem:   Brainstem infarct, acute ([place])   Past Medical History:  Past Medical History:  Diagnosis Date   Gout    Seasonal allergies    Past Surgical History:  Past Surgical History:  Procedure Laterality Date   ABDOMINAL HYSTERECTOMY     BACK SURGERY     GANGLION CYST EXCISION      Assessment & Plan Clinical Impression:  Pt is a 75 year old female with history of gout and back surgery. Presented 02/24/2021 with acute onset of right side weakness and mild slurred speech.  CT/MRI showed acute infarct left pons.  Slight edema without mass-effect.  Patient did not receive tPA.  CT angiogram head and neck negative for large vessel occlusion.  Echocardiogram with ejection fraction of 55 to 60% no wall motion abnormalities.  Admission chemistries unremarkable except glucose 240, hemoglobin A1c 7.4.  Maintained on aspirin 81 mg daily and Plavix 75 mg daily for CVA prophylaxis x3 weeks then aspirin alone.  Subcutaneous Lovenox for DVT prophylaxis..  Incidental findings hospital course of thyroid nodule with ultrasound of thyroid showing parenchymal echotexture.  Recommend outpatient follow-up.  Patient is tolerating a regular consistency diet and speech therapy has signed off.  Glucophage was initiated for new finding of diabetes mellitus.  Due to patient's right side weakness and therapy evaluations completed patient was admitted for a comprehensive rehab program. Patient transferred to CIR on 03/01/2021 .   Patient currently requires max-ModA with mobility  secondary to muscle weakness, decreased cardiorespiratoy endurance, impaired timing and sequencing and decreased coordination, and decreased sitting balance, decreased standing balance, decreased postural control, hemiplegia, and decreased balance strategies.  Prior to hospitalization, patient was independent  with mobility and lived alone in an apartment with level entry.  Patient plans to return home with daughter in a 2 story home. Home access is 1 step to enter. 12 steps to access bedroom and full bath on 2nd floor.  Patient will benefit from skilled PT intervention to maximize safe functional mobility, minimize fall risk, and decrease caregiver burden for planned discharge home with 24 hour supervision.  Anticipate patient will benefit from follow up [place] at discharge.  PT - End of Session Activity Tolerance: Tolerates 30+ min activity with multiple rests Endurance Deficit: Yes PT Assessment Rehab Potential (ACUTE/IP ONLY): Good PT Barriers to Discharge: Home environment access/layout;Decreased caregiver support PT Patient demonstrates impairments in the following area(s): Balance;Behavior;Endurance;Motor;Pain;Safety;Sensory;Skin Integrity;Edema PT Transfers Functional Problem(s): Bed Mobility;Bed to Chair;Car;Furniture PT Locomotion Functional Problem(s): Ambulation;Wheelchair Mobility;Stairs PT Plan PT Intensity: Minimum of 1-2 x/day ,45 to 90 minutes PT Frequency: 5 out of 7 days PT Duration Estimated Length of Stay: ~3.5 weeks PT Treatment/Interventions: Ambulation/gait training;Discharge planning;Functional mobility training;Therapeutic Activities;Visual/perceptual remediation/compensation;Psychosocial support;Balance/vestibular training;Disease management/prevention;Neuromuscular re-education;Skin care/wound management;Therapeutic Exercise;Wheelchair propulsion/positioning;Cognitive remediation/compensation;DME/adaptive equipment instruction;Pain management;Splinting/orthotics;UE/LE  Strength taining/ROM;Community reintegration;Functional electrical stimulation;Patient/family education;Stair training;UE/LE Coordination activities PT Transfers Anticipated Outcome(s): Supervision PT Locomotion Anticipated Outcome(s): CGA using LRAD PT Recommendation Follow Up Recommendations: Home health PT;24 hour supervision/assistance Patient destination: Home Equipment Recommended: To be determined   PT Evaluation Precautions/Restrictions Precautions Precautions: Fall Precaution Comments: R hemiparesis Restrictions Weight Bearing Restrictions: No Pain Pain Assessment Pain Scale:  0-10 Pain Score: 0-No pain Home Living/Prior Functioning Home Living Available Help at Discharge: Family;Available 24 hours/day Type of Home: House Home Access: Stairs to enter [employer] of Steps: 1 Home Layout: Two level;1/2 bath on main level;Bed/bath upstairs Alternate Level Stairs-Number of Steps: 12 Alternate Level Stairs-Rails: Right Bathroom Shower/Tub: Chiropodist: Standard Bathroom Accessibility: Yes Additional Comments: Pt plans to go home with daughter to the above house at DC; no grab bars in bathroom  Lives With: Daughter Prior Function Level of Independence: Independent with basic ADLs;Independent with gait;Independent with homemaking with ambulation;Independent with transfers  Able to Take Stairs?: Yes Driving: No (pt reports someone would drive her out to the community) Vocation: Full time employment 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 Pt reports sensation is the same on both sides today Coordination Gross Motor Movements are Fluid and Coordinated: No Fine Motor Movements are Fluid and Coordinated: No Coordination and Movement Description: uncoordinated/ non- fluid movement patterns secondary to hemiparesis Motor  Motor Motor: Hemiplegia Motor - Skilled Clinical Observations: R Hemiplegia UE>LE   Trunk/Postural Assessment  Cervical Assessment Cervical Assessment: Within Functional Limits Thoracic Assessment Thoracic Assessment: Exceptions to [facility] (slight thoracic rounding) Lumbar Assessment Lumbar Assessment: Exceptions to [facility] (posterior pelvic tilt) Postural Control Postural Control: Deficits on evaluation Righting Reactions: posterior and right LOB when attempting to cross the RLE over the left knee  Balance Balance Balance Assessed: Yes Standardized Balance Assessment Standardized Balance Assessment: PASS Postural Assessment Scale for Stroke Patients (PASS)  Give the subject instructions for each item as written below. When scoring the item, record the lowest response category that applies for each item.  Maintaining a Posture  _3_ 1. Sitting Without Support Instructions: Have the subject sit on a bench/mat without back support and with feet flat on the floor. (3) Can sit for 5 minutes without support (2) Can sit for more than 10 seconds without support (1) Can sit with slight support (for example, by 1 hand) (0) Cannot sit  _2_ 2. Standing With Support Instructions: Have the subject stand, providing support as needed. Evaluate only the ability to stand with or without support. Do not consider the quality of the stance. (3) Can stand with support of only 1 hand (2) Can stand with moderate support of 1 person (1) Can  stand with strong support of 2 people (0) Cannot stand, even with support  _0_ 3. Standing Without Support Instructions: Have the subject stand without support. Evaluate only the ability to stand with or without support. Do not consider the quality of the stance. (3) Can stand without support for more than 1 minute and simultaneously perform arm movements at about shoulder level (2) Can stand without support for 1 minute or stands slightly asymmetrically  (1) Can stand without support for 10 seconds or leans heavily on 1 leg (0) Cannot stand without support  _0_ 4. Standing on Nonparetic Leg Instructions: Have the subject stand on the nonparetic leg. Evaluate only the ability to bear weight entirely on the nonparetic leg. Do  not consider how the subject accomplishes the task. (3) Can stand on nonparetic leg for more than 10 seconds (2) Can stand on nonparetic leg for more than 5 seconds (1) Can stand on nonparetic leg for a few seconds (0) Cannot stand on nonparetic leg  _0_ 5. Standing on Paretic Leg Instructions: Have the subject stand on the paretic leg. Evaluate only the ability to bear weight entirely on the paretic leg. Do not consider how the subject accomplishes the task. (3) Can stand on paretic leg for more than 10 seconds (2) Can stand on paretic leg for more than 5 seconds (1) Can stand on paretic leg for a few seconds (0) Cannot stand on paretic leg  Maintaining Posture SUBTOTAL __5__  Changing a Posture  _2_ 6. Supine to Paretic Side Lateral Instructions: Begin with the subject in supine on a treatment mat. Instruct the subject to roll to the paretic side (lateral movement). Assist as necessary. Evaluate the subject's performance on the amount of help required. Do not consider the quality of performance. (3) Can perform without help (2) Can perform with little help (1) Can perform with much help (0) Cannot perform  _1_ 7. Supine to Nonparetic Side  Lateral Instructions: Begin with the subject in supine on a treatment mat. Instruct the subject to roll to the nonparetic side (lateral movement). Assist as necessary. Evaluate the subject's performance on the amount of help required. Do not consider the quality of performance. (3) Can perform without help (2) Can perform with little help (1) Can perform with much help (0) Cannot perform  _1_ 8. Supine to Sitting Up on the Edge of the Mat Instructions: Begin with the subject in supine on a treatment mat. Instruct the subject to come to sitting on the edge of the mat. Assist as necessary. Evaluate the subject's performance on the amount of help required. Do not consider the quality of performance. (3) Can perform without help (2) Can perform with little help (1) Can perform with much help (0) Cannot perform  _1 _ 9. Sitting on the Edge of the Mat to Supine Instructions: Begin with the on the edge of a treatment mat. Instruct the subject to return to supine. Assist as necessary. Evaluate the subject's performance on the amount of help required. Do not consider the quality of performance. (3) Can perform without help (2) Can perform with little help (1) Can perform with much help (0) Cannot perform  _1_ 10. Sitting to Standing Up Instructions: Begin with the subject sitting on the edge of a treatment mat. Instruct the subject to stand up without support. Assist if necessary. Evaluate the subject's performance on the amount of help required. Do not consider the quality of performance. (3) Can perform without help (2) Can perform with little help (1) Can perform with much help (0) Cannot perform  _1_ 11. Standing Up to Sitting Down Instructions: Begin with the subject standing by edge of a treatment mat. Instruct the subject to sit on edge of mat without support. Assist if necessary. Evaluate the subject's performance on the amount of help required. Do not consider the quality of  performance. (3) Can perform without help (2) Can perform with little help (1) Can perform with much help (0) Cannot perform  _0_ 12. Standing, Picking Up a Pencil from the Floor Instructions: Begin with the subject standing. Instruct the subject to pick up a pencil fro the floor without support. Assist if necessary. Evaluate the subject's performance on the amount of  help required. Do not consider the quality of performance. (3) Can perform without help (2) Can perform with little help (1) Can perform with much help (0) Cannot perform  Changing Posture SUBTOTAL _7_   TOTAL __12___  Static Sitting Balance Static Sitting - Balance Support: No upper extremity supported;Feet supported Static Sitting - Level of Assistance: 5: Stand by assistance Dynamic Sitting Balance Dynamic Sitting - Balance Support: Feet unsupported;During functional activity Dynamic Sitting - Level of Assistance: 3: Mod assist Static Standing Balance Static Standing - Balance Support: During functional activity;No upper extremity supported Static Standing - Level of Assistance: 3: Mod assist Dynamic Standing Balance Dynamic Standing - Balance Support: During functional activity;No upper extremity supported Dynamic Standing - Level of Assistance: 2: Max assist (functional stepping without assistive device) Extremity Assessment  RLE Assessment RLE Assessment: Exceptions to [facility] General Strength Comments: Pt unable to wiggle toes on RLE RLE Strength RLE Overall Strength: Deficits Right Hip Flexion: 1/5 Right Hip ABduction: 2/5 Right Hip ADduction: 2+/5 Right Knee Flexion: 0/5 (MMT in open chain. Functionally 2-/5 with standing) Right Knee Extension: 1/5 Right Ankle Dorsiflexion: 0/5 Right Ankle Plantar Flexion: 0/5 LLE Assessment LLE Assessment: Within Functional Limits General Strength Comments: 5/5 globally except hip flexion 4/5  Care Tool Care Tool Bed Mobility Roll left and right activity     Moderate Assistance - Patient 50 - 74%      Sit to lying activity    Moderate Assistance - Patient 50 - 74%      Lying to sitting edge of bed activity   Lying to sitting edge of bed assist level: Moderate Assistance - Patient 50 - 74%     Care Tool Transfers Sit to stand transfer   Sit to stand assist level: Moderate Assistance - Patient 50 - 74%    Chair/bed transfer   Chair/bed transfer assist level: Moderate Assistance - Patient 50 - 74%     Toilet transfer   Assist Level: Moderate Assistance - Patient 50 - 74%    Car transfer    Safety/Medical       Care Tool Locomotion Ambulation   Assist level: 2 helpers Assistive device: Other (comment) (LUE Support on railing) Max distance: 35ft  Walk 10 feet activity   Assist level: 2 helpers Assistive device: Other (comment) (LUE on railing. Therapist on Right side)   Walk 50 feet with 2 turns activity Walk 50 feet with 2 turns activity did not occur: Safety/medical concerns      Walk 150 feet activity Walk 150 feet activity did not occur: Safety/medical concerns      Walk 10 feet on uneven surfaces activity Walk 10 feet on uneven surfaces activity did not occur: Safety/medical concerns      Stairs Stair activity did not occur: Safety/medical concerns        Walk up/down 1 step activity Walk up/down 1 step or curb (drop down) activity did not occur: Safety/medical concerns        Walk up/down 4 steps activity  Walk up/down 4 steps activity did not occur: Safety/medical concerns    Walk up/down 12 steps activity Walk up/down 12 steps activity did not occur: Safety/medical concerns      Pick up small objects from floor Pick up small object from the floor (from standing position) activity did not occur: Safety/medical concerns      Wheelchair Will patient use wheelchair at discharge?:  (TBD)          Wheel 50 feet with 2  turns activity      Wheel 150 feet activity        Refer to Care Plan for Long Term  Goals  SHORT TERM GOAL WEEK 1 PT Short Term Goal 1 (Week 1): Pt will complete bed mobility with MinA PT Short Term Goal 2 (Week 1): Pt will ambulate at least 34f with ModA PT Short Term Goal 3 (Week 1): Pt will transfer bed to chair with MinA using squat pivot consistently PT Short Term Goal 4 (Week 1): Pt will got up 4 steps using handrails and ModA  Recommendations for other services: None   Skilled Therapeutic Intervention Pt received sitting in wheelchair and agreeable to PT session. Evaluation completed (see details above) with patient education regarding purpose of PT evaluation, PT POC and goals, therapy schedule, weekly team meetings, and other CIR information including safety plan and fall risk safety. Pt performed mobility tasks below with the specified levels of assistance. During ambulation, pt required verbal and tactile cuing for upright posture and manual facilitation for anterior shifting at the hips with swing phase.   Mobility Bed Mobility Bed Mobility: Rolling Right;Rolling Left;Left Sidelying to Sit;Sit to Supine Rolling Right: Minimal Assistance - Patient > 75% (manual assist with maintaining hooklying on RLE) Rolling Left: Moderate Assistance - Patient 50-74% (**bed mobility on mat table. no handrail assist. Manual facilitation for RLE/ trunk. VC to bring RUE along with the roll) Left Sidelying to Sit: Moderate Assistance - Patient 50-74% Sit to Supine: Moderate Assistance - Patient 50-74% Transfers Transfers: Stand Pivot Transfers Sit to Stand: Moderate Assistance - Patient 50-74% Stand to Sit: Moderate Assistance - Patient 50-74% Stand Pivot Transfers: Moderate Assistance - Patient 50 - 74% Stand Pivot Transfer Details: Visual cues/gestures for sequencing;Verbal cues for sequencing;Manual facilitation for weight shifting;Manual facilitation for placement Transfer (Assistive device): None Locomotion  Gait Ambulation: Yes Gait Assistance: Maximal Assistance -  Patient 25-49%;2 Helpers (+2 wc follow for safety) Assistive device: Other (Comment) (LUE support on railing, Therapist on Right side for manual facilitation) Gait Assistance Details: Tactile cues for sequencing;Verbal cues for sequencing;Verbal cues for technique;Verbal cues for precautions/safety;Verbal cues for gait pattern;Manual facilitation for weight shifting;Manual facilitation for weight bearing;Manual facilitation for placement Gait Gait: Yes Gait Pattern:  (max manual facilitation of RLE) Gait Pattern: Step-to pattern;Decreased step length - left;Decreased stance time - right;Decreased stride length;Decreased hip/knee flexion - right;Decreased hip/knee flexion - left;Decreased dorsiflexion - right;Decreased weight shift to right;Trunk flexed Stairs / Additional Locomotion Stairs: No Wheelchair Mobility Wheelchair Mobility: No   Discharge Criteria: Patient will be discharged from PT if patient refuses treatment 3 consecutive times without medical reason, if treatment goals not met, if there is a change in medical status, if patient makes no progress towards goals or if patient is discharged from hospital.  The above assessment, treatment plan, treatment alternatives and goals were discussed and mutually agreed upon: by patient  Talar Fraley, SPT  03/02/2021, 6:15 PM

## 2021-03-02 NOTE — Progress Notes (Signed)
Inpatient Rehabilitation Care Coordinator Assessment and Plan Patient Details  Name: Shaelyn Decarli MRN: [id number] Date of Birth: 06/10/1946  Today's Date: 03/02/2021  Hospital Problems: Principal Problem:   Brainstem infarct, acute [facility])  Past Medical History:  Past Medical History:  Diagnosis Date   Gout    Seasonal allergies    Past Surgical History:  Past Surgical History:  Procedure Laterality Date   ABDOMINAL HYSTERECTOMY     BACK SURGERY     GANGLION CYST EXCISION     Social History:  reports that she has never smoked. She has never used smokeless tobacco. She reports that she does not drink alcohol and does not use drugs.  Family / Support Systems Children: Hendricks Limes Anticipated Caregiver: Hendricks Limes and Son Ability/Limitations of Caregiver: none Caregiver Availability: 24/7 Family Dynamics: supportive family  Social History Preferred language: English Religion:  Read: Yes Write: Yes Employment Status: Employed Public relations account executive Issues: n/a Guardian/Conservator: n/a   Abuse/Neglect Abuse/Neglect Assessment Can Be Completed: Yes Physical Abuse: Denies Verbal Abuse: Denies Sexual Abuse: Denies Exploitation of patient/patient's resources: Denies Self-Neglect: Denies  Emotional Status Pt's affect, behavior and adjustment status: patient pleasnt Recent Psychosocial Issues: n/a Psychiatric History: n/a Substance Abuse History: n/a  Patient / Family Perceptions, Expectations & Goals Pt/Family understanding of illness & functional limitations: yes, dauhgter understanding Premorbid pt/family roles/activities: previosuly working and in the [employer] daily Anticipated changes in roles/activities/participation: family able to assist with roles and task Pt/family expectations/goals: supervision  Recruitment consultant: None Premorbid Home Care/DME Agencies: None Transportation available at discharge: family able to  transport  Discharge Planning Living Arrangements: Children Support Systems: Children Type of Residence: Private residence (2 level home, 1/2 bath on main) Insurance Resources: Multimedia programmer (specify) Nurse, mental health) Financial Resources: Family Support Financial Screen Referred: No Living Expenses: Lives with family Money Management: Patient, Family Does the patient have any problems obtaining your medications?: No Home Management: Independent Patient/Family Preliminary Plans: Family able to assist with money and medication management Care Coordinator Barriers to Discharge: New diabetic Care Coordinator Anticipated Follow Up Needs: HH/OP Expected length of stay: 14-21 Days  Clinical Impression Sw met with patient at bedside, introduced self and explained role. Called daughter (charlene) to provide information. No additional questions or concerns, sw will cont to follow up.  Dyanne Iha 03/02/2021, 12:30 PM

## 2021-03-02 NOTE — Progress Notes (Signed)
Inpatient Rehabilitation Center Individual Statement of Services  Patient Name:  Carrie Kline  Date:  03/02/2021  Welcome to the Inpatient Rehabilitation Center.  Our goal is to provide you with an individualized program based on your diagnosis and situation, designed to meet your specific needs.  With this comprehensive rehabilitation program, you will be expected to participate in at least 3 hours of rehabilitation therapies Monday-Friday, with modified therapy programming on the weekends.  Your rehabilitation program will include the following services:  Physical Therapy (PT), Occupational Therapy (OT), Speech Therapy (ST), 24 hour per day rehabilitation nursing, Therapeutic Recreaction (TR), Neuropsychology, Care Coordinator, Rehabilitation Medicine, Nutrition Services, Pharmacy Services, and Other  Weekly team conferences will be held on Wednesdays to discuss your progress.  Your Inpatient Rehabilitation Care Coordinator will talk with you frequently to get your input and to update you on team discussions.  Team conferences with you and your family in attendance may also be held.  Expected length of stay: 14-21 Days  Overall anticipated outcome: Supervision  Depending on your progress and recovery, your program may change. Your Inpatient Rehabilitation Care Coordinator will coordinate services and will keep you informed of any changes. Your Inpatient Rehabilitation Care Coordinator's name and contact numbers are listed  below.  The following services may also be recommended but are not provided by the Inpatient Rehabilitation Center:   Home Health Rehabiltiation Services Outpatient Rehabilitation Services    Arrangements will be made to provide these services after discharge if needed.  Arrangements include referral to agencies that provide these services.  Your insurance has been verified to be:  [employer]  Your primary doctor is:  NO PCP  Pertinent information will be shared with  your doctor and your insurance company.  Inpatient Rehabilitation Care Coordinator:  Lavera Guise, [place] [phone] or [phone]  Information discussed with and copy given to patient by: Andria Rhein, 03/02/2021, 11:16 AM

## 2021-03-03 DIAGNOSIS — E669 Obesity, unspecified: Secondary | ICD-10-CM

## 2021-03-03 DIAGNOSIS — I10 Essential (primary) hypertension: Secondary | ICD-10-CM

## 2021-03-03 DIAGNOSIS — E1169 Type 2 diabetes mellitus with other specified complication: Secondary | ICD-10-CM

## 2021-03-03 LAB — GLUCOSE, CAPILLARY
Glucose-Capillary: 124 mg/dL — ABNORMAL HIGH (ref 70–99)
Glucose-Capillary: 128 mg/dL — ABNORMAL HIGH (ref 70–99)
Glucose-Capillary: 82 mg/dL (ref 70–99)
Glucose-Capillary: 105 mg/dL — ABNORMAL HIGH (ref 70–99)

## 2021-03-03 NOTE — Progress Notes (Signed)
Occupational Therapy Session Note  Patient Details  Name: Carrie Kline MRN: [id number] Date of Birth: [birth year]  Today's Date: 03/03/2021 OT Individual Time: 0800-0903 OT Individual Time Calculation (min): 63 min    Short Term Goals: Week 1:  OT Short Term Goal 1 (Week 1): Pt will complete LB bathing with mod assist sit to stand for two consecutive sessions. OT Short Term Goal 2 (Week 1): Pt will complete LB dressing with mod assist for donning underpants and pants sit to stand. OT Short Term Goal 3 (Week 1): Pt will complete stand pivot transfer to the 3:1 with mod assist. OT Short Term Goal 4 (Week 1): Pt will use the RUE as a stabilizer with mod assist.  Skilled Therapeutic Interventions/Progress Updates:    Session 1: (4098-1191)  Pt in bed to start agreeable to toileting and shower this am.  She was able to transfer to the right side of the bed with mod assist overall for right sidelying to sit.  She completed squat pivot transfer to the wheelchair and to the 3:1 over the toilet with mod assist to the right.  Max assist for toilet hygiene and clothing management in standing.  Ambulated approximately 5' over to the tub bench from the 3:1 with use of the grab bar on the left side for support.  Max facilitation to avoid right knee buckling.  She then completed shower sit to stand with overall mod assist.  Max hand over hand assist was needed for using the RUE to wash the left arm and for washing the right upper leg.  She was able to use the RUE as a stabilizer to hold the washcloth while pouring soap with min facilitation.  Completed transfer stand pivot to the wheelchair from the shower with max assist for focus on dressing sit to stand at the sink.  She needed mod instructional cueing with min assist for donning pullover shirt with mod assist to donn brief and pants following hemi techniques.  Total assist was needed for gripper socks and for velcro shoes.  She was able to complete oral hygiene  with setup from wheelchair level.  Finished session with pt resting in the wheelchair and with the call button and phone in reach and RUE supported on half lap tray.  Safety alarm in place as well.   Session 2: ([id number])  Pt in bed to begin session with reports of being nauseas and vomiting earlier during PT.  She says she feels better now that nursing has issued her some meds to assist with this.  She was able to transfer to sitting from supine with mod assist and then BP was taken at 116/74.  She was able to complete squat pivot transfer to the wheelchair for transport down to the gym.  Had her work on transferring to the therapy mat with mod assist.  Worked on RUE neuromuscular re-education with focus on reciprical and bilateral scooting.  Placed yoga block under the RUE while working on weightshifts to the right side while reaching with the LUE.  Emphasis on activation of the right arm to keep from falling over to the right.  Then progressed to scooting with mod assist to maintain RUE on the therapy block with mod facilitation in the trunk to complete scooting forward on the right side.  Transitioned to activation of the RUE pushing a tilted stool forward to target.  She was able to complete activation of elbow flexion but needed mod facilitation to avoid shoulder hike, forward lean,  and head tilt to the left.  Transferred back to the wheelchair at mod assist level and then returned to the room to complete session.  Encouraged pt to continue working on self AAROM exercises on her own today and over the weekend as well as working on towel slides on the half lap tray with washcloth placed under her hand.  Call button and phone in reach with safety belt in place.    Therapy Documentation Precautions:  Precautions Precautions: Fall Precaution Comments: R hemiparesis Restrictions Weight Bearing Restrictions: No   Pain: Pain Assessment Pain Scale: Faces Pain Score: 0-No pain ADL: See Care Tool  Section for some details of mobility and selfcare   Therapy/Group: Individual Therapy  Tanisa Lagace OTR/L 03/03/2021, 9:41 AM

## 2021-03-03 NOTE — IPOC Note (Signed)
Overall Plan of Care [facility]) Patient Details Name: Rumi Kolodziej MRN: [id number] DOB: 24-Apr-1946  Admitting Diagnosis: Brainstem infarct, acute [facility])  Hospital Problems: Principal Problem:   Brainstem infarct, acute (HCC)     Functional Problem List: Nursing Bladder, Bowel, Pain, Medication Management, Safety, Endurance  PT Balance, Behavior, Endurance, Motor, Pain, Safety, Sensory, Skin Integrity, Edema  OT Balance, Endurance, Motor  SLP    TR         Basic ADL's: OT Eating, Grooming, Bathing, Dressing, Toileting     Advanced  ADL's: OT Simple Meal Preparation     Transfers: PT Bed Mobility, Bed to Chair, Car, Occupational psychologist, Research scientist (life sciences): PT Ambulation, Psychologist, prison and probation services, Stairs     Additional Impairments: OT Fuctional Use of Upper Extremity  SLP        TR      Anticipated Outcomes Item Anticipated Outcome  Self Feeding modified independent  Swallowing      Basic self-care  supervision to min guard assist  Toileting  min guard assist   Bathroom Transfers min guard assist  Bowel/Bladder  Manage bowel with mod I assist  Transfers  Supervision  Locomotion  CGA using LRAD  Communication     Cognition     Pain  at or below level 4  Safety/Judgment  maintain safety with cues/reminders   Therapy Plan: PT Intensity: Minimum of 1-2 x/day ,45 to 90 minutes PT Frequency: 5 out of 7 days PT Duration Estimated Length of Stay: ~3.5 weeks OT Intensity: Minimum of 1-2 x/day, 45 to 90 minutes OT Frequency: 5 out of 7 days OT Duration/Estimated Length of Stay: 18-21 days     Due to the current state of emergency, patients may not be receiving their 3-hours of Medicare-mandated therapy.   Team Interventions: Nursing Interventions Patient/Family Education, Bowel Management, Pain Management, Discharge Planning, Medication Management, Disease Management/Prevention  PT interventions Ambulation/gait training, Discharge planning, Functional  mobility training, Therapeutic Activities, Visual/perceptual remediation/compensation, Psychosocial support, Balance/vestibular training, Disease management/prevention, Neuromuscular re-education, Skin care/wound management, Therapeutic Exercise, Wheelchair propulsion/positioning, Cognitive remediation/compensation, DME/adaptive equipment instruction, Pain management, Splinting/orthotics, UE/LE Strength taining/ROM, Community reintegration, Development worker, international aid stimulation, Patient/family education, Museum/gallery curator, UE/LE Coordination activities  OT Interventions Warden/ranger, Firefighter, Fish farm manager, Cognitive remediation/compensation, Discharge planning, Functional electrical stimulation, Pain management, Self Care/advanced ADL retraining, Therapeutic Activities, UE/LE Coordination activities, Therapeutic Exercise, Patient/family education, Functional mobility training, Neuromuscular re-education, Psychosocial support, Splinting/orthotics, Wheelchair propulsion/positioning, UE/LE Strength taining/ROM, Visual/perceptual remediation/compensation  SLP Interventions    TR Interventions    SW/CM Interventions Discharge Planning, Psychosocial Support, Patient/Family Education, Disease Management/Prevention   Barriers to Discharge MD  Medical stability  Nursing Decreased caregiver support, Home environment access/layout, New diabetic 2 level 1 ste 1/2 bath on main. main B+B upstairs w daughter  PT Home environment access/layout, Decreased caregiver support    OT      SLP      SW New diabetic     Team Discharge Planning: Destination: PT-Home ,OT- Home , SLP-  Projected Follow-up: PT-Home health PT, 24 hour supervision/assistance, OT-  24 hour supervision/assistance, Outpatient OT, SLP-  Projected Equipment Needs: PT-To be determined, OT- To be determined, SLP-  Equipment Details: PT- , OT-  Patient/family involved in discharge planning: PT-  Patient,  OT-Patient, SLP-   MD ELOS: 3 weeks Medical Rehab Prognosis:  Excellent Assessment: The patient has been admitted for CIR therapies with the diagnosis of right brainstem infarct. The team will be addressing functional mobility, strength, stamina,  balance, safety, adaptive techniques and equipment, self-care, bowel and bladder mgt, patient and caregiver education, NMR, orthotics, community reentry. Goals have been set at contact guard with transfers and mobility and min-guard assist with self-care activities.   Due to the current state of emergency, patients may not be receiving their 3 hours per day of Medicare-mandated therapy.    Ranelle Oyster, MD, FAAPMR     See Team Conference Notes for weekly updates to the plan of care

## 2021-03-03 NOTE — Progress Notes (Signed)
   03/03/21 1350  Clinical Encounter Type  Visited With Patient and family together  Visit Type Spiritual support  Consult/Referral To Chaplain  Spiritual Encounters  Spiritual Needs  (Following up on Physical Therapy)  Chaplain visited Ms. [name] and Family. Checking on her progress and physical therapy. She is now moving her leg and can move her thumb on her right hand and continue her arm exercises. Always a pleasure seeing and talking with Ms. [name].   Chaplain Pax Reasoner Morgan-Simpson [phone]

## 2021-03-03 NOTE — Progress Notes (Signed)
   03/03/21 1109  Clinical Encounter Type  Visited With Patient  Visit Type Spiritual support  Referral From Nurse  Consult/Referral To Chaplain  Spiritual Encounters  Spiritual Needs  (Following up on physical therapy.)  Chaplain responded to the consult for Carrie Kline.  She was on her way to physical therapy. Informed her I will be back to speak with her after therapy.    Chaplain Kiyani Jernigan Morgan-Simpson [phone]

## 2021-03-03 NOTE — Progress Notes (Signed)
PROGRESS NOTE   Subjective/Complaints:  Loose stools yesterday (5). None since 6:30PM  ROS: Patient denies fever, rash, sore throat, blurred vision, nausea, vomiting, diarrhea, cough, shortness of breath or chest pain, joint or back pain, headache, or mood change.  Objective:   No results found. Recent Labs    03/02/21 0859  WBC 7.5  HGB 13.6  HCT 41.0  PLT 225   Recent Labs    03/02/21 0859  NA 134*  K 3.9  CL 103  CO2 22  GLUCOSE 162*  BUN 27*  CREATININE 1.10*  CALCIUM 9.6    Intake/Output Summary (Last 24 hours) at 03/03/2021 1006 Last data filed at 03/03/2021 0728 Gross per 24 hour  Intake 917 ml  Output --  Net 917 ml        Physical Exam: Vital Signs Blood pressure 115/68, pulse 79, temperature 98.5 F (36.9 C), resp. rate 18, height 5\' 3"  (1.6 m), weight 90 kg, SpO2 96 %.  Constitutional: No distress . Vital signs reviewed. HEENT: EOMI, oral membranes moist Neck: supple Cardiovascular: RRR without murmur. No JVD    Respiratory/Chest: CTA Bilaterally without wheezes or rales. Normal effort    GI/Abdomen: BS +, non-tender, non-distended Ext: no clubbing, cyanosis, or edema Psych: pleasant and cooperative  Skin: No evidence of breakdown, no evidence of rash Neurologic: Cranial nerves II through XII intact, motor strength is 5/5 in left deltoid, bicep, tricep, grip, hip flexor, knee extensors, ankle dorsiflexor and plantar flexor 2/5 RUE bi, tri, 0/5 finger and wrist , 2- R thumb flexion and R hip /knee ext synergy, 0/5 distal RLE--stable exam Sensory exam normal sensation to light touch and proprioception in bilateral upper and lower extremities  Musculoskeletal: normal ROM, no pain    Assessment/Plan: 1. Functional deficits which require 3+ hours per day of interdisciplinary therapy in a comprehensive inpatient rehab setting. Physiatrist is providing close team supervision and 24 hour  management of active medical problems listed below. Physiatrist and rehab team continue to assess barriers to discharge/monitor patient progress toward functional and medical goals  Care Tool:  Bathing    Body parts bathed by patient: Right arm, Chest, Abdomen, Right upper leg, Left upper leg, Right lower leg, Face, Buttocks   Body parts bathed by helper: Buttocks, Left arm, Right lower leg, Front perineal area     Bathing assist Assist Level: Moderate Assistance - Patient 50 - 74%     Upper Body Dressing/Undressing Upper body dressing   What is the patient wearing?: Pull over shirt    Upper body assist Assist Level: Minimal Assistance - Patient > 75%    Lower Body Dressing/Undressing Lower body dressing      What is the patient wearing?: Incontinence brief, Pants     Lower body assist Assist for lower body dressing: Moderate Assistance - Patient 50 - 74%     Toileting Toileting    Toileting assist Assist for toileting: Moderate Assistance - Patient 50 - 74%     Transfers Chair/bed transfer  Transfers assist     Chair/bed transfer assist level: Moderate Assistance - Patient 50 - 74% (stand pivot to the right)     Locomotion Ambulation  Ambulation assist      Assist level: Maximal Assistance - Patient 25 - 49% Assistive device: Other (comment) (grab bar on the left) Max distance: 5'   Walk 10 feet activity   Assist     Assist level: 2 helpers Assistive device: Other (comment) (LUE on railing. Therapist on Right side)   Walk 50 feet activity   Assist Walk 50 feet with 2 turns activity did not occur: Safety/medical concerns         Walk 150 feet activity   Assist Walk 150 feet activity did not occur: Safety/medical concerns         Walk 10 feet on uneven surface  activity   Assist Walk 10 feet on uneven surfaces activity did not occur: Safety/medical concerns         Wheelchair     Assist Will patient use wheelchair at  discharge?:  (TBD)   Wheelchair activity did not occur: N/A         Wheelchair 50 feet with 2 turns activity    Assist    Wheelchair 50 feet with 2 turns activity did not occur: N/A       Wheelchair 150 feet activity     Assist  Wheelchair 150 feet activity did not occur: N/A       Blood pressure 115/68, pulse 79, temperature 98.5 F (36.9 C), resp. rate 18, height 5\' 3"  (1.6 m), weight 90 kg, SpO2 96 %.  Medical Problem List and Plan: 1.  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  Antithrombotics: -DVT/anticoagulation: Lovenox             -antiplatelet therapy: Aspirin 81 mg daily and Plavix 75 mg daily x3 weeks and aspirin alone 3. Pain Management: Tylenol as needed 4. Mood: Provide emotional support             -antipsychotic agents: N/A 5. Neuropsych: This patient is capable of making decisions on her own behalf. 6. Skin/Wound Care: Routine skin checks 7. Fluids/Electrolytes/Nutrition: Routine in and outs with follow-up chemistries 8.  New findings diabetes mellitus.  Hemoglobin A1c 7.4.  Glucophage 500 mg daily.  Diabetic teaching CBG (last 3)  Recent Labs    03/02/21 1618 03/02/21 2106 03/03/21 0555  GLUCAP 104* 105* 124*  Controlled 7/8  9.  Hypertension.  Lisinopril 5 mg daily Vitals:   03/03/21 0451 03/03/21 0742  BP: 129/68 115/68  Pulse: 77 79  Resp: 18   Temp: 98.5 F (36.9 C)   SpO2: 96%    Controlled 7/8 10.  Hyperlipidemia.  Lipitor 11.  Thyroid nodule.  Follow-up outpatient    LOS: 2 days A FACE TO FACE EVALUATION WAS PERFORMED  05/04/21 03/03/2021, 10:06 AM

## 2021-03-03 NOTE — Progress Notes (Signed)
Physical Therapy Session Note  Patient Details  Name: Carrie Kline MRN: [id number] Date of Birth: 1945-09-02  Today's Date: 03/03/2021 PT Individual Time: 0905-0958 PT Individual Time Calculation (min): 53 min   Short Term Goals: Week 1:  PT Short Term Goal 1 (Week 1): Pt will complete bed mobility with MinA PT Short Term Goal 2 (Week 1): Pt will ambulate at least 22ft with ModA PT Short Term Goal 3 (Week 1): Pt will transfer bed to chair with MinA using squat pivot consistently PT Short Term Goal 4 (Week 1): Pt will got up 4 steps using handrails and ModA  Skilled Therapeutic Interventions/Progress Updates:    Pt received sitting in wheelchair at the start of therapy. Pt was agreeable to PT session but states she is feeling nauseous again this morning. Pt experienced episode of emesis in the room and a cold wash cloth was applied prior to being wheeled to 4w gym. Gait belt placed prior to initiation of mobility.  Pt transferred from wc > mat table with ModA towards the left. Verbal cues for hand placement, weight shifting forward, and sequencing. Pt completed exercises sitting edge of mat table with MaxA AAROM with mutlimodal cuing for facilitation and increased activation of targeted muscles with a focus on eccentric phase. 1x10 RLE Hip flexion, and 1x10 RLE knee extension. Alternated LLE knee flexion with RLE to assist in NMR. 1x10 BLE Hip ABD and 1x10 BLE hip Add. Frequent rest breaks required throughout session secondary to pt feeling nauseous and producing clear liquid emesis. Pt stated she did not want this to interfere with her participation in therapy. Episodes progressed in intensity with therapy guarding on the right side and providing comfort via back rub to patient. Activity was ended and pt transferred towards the right to the wc with ModA x2 squat pivot.    Pt performed squat pivot towards right with modA from wc>bed.  HOB elevated to 30 degrees, bed alarm on, and call bell within  reach. RN notified of emesis and was bedside at the end of session  Therapy Documentation Precautions:  Precautions Precautions: Fall Precaution Comments: R hemiparesis Restrictions Weight Bearing Restrictions: No  Pain: Pain Assessment Pain Scale: 0-10 Pain Score: 0-No pain    Therapy/Group: Individual Therapy  Joshue Badal, SPT 03/03/2021, 3:55 PM

## 2021-03-04 LAB — GLUCOSE, CAPILLARY
Glucose-Capillary: 122 mg/dL — ABNORMAL HIGH (ref 70–99)
Glucose-Capillary: 127 mg/dL — ABNORMAL HIGH (ref 70–99)
Glucose-Capillary: 102 mg/dL — ABNORMAL HIGH (ref 70–99)
Glucose-Capillary: 114 mg/dL — ABNORMAL HIGH (ref 70–99)

## 2021-03-04 MED ORDER — GLIPIZIDE 5 MG PO TABS
2.5000 mg | ORAL_TABLET | Freq: Every day | ORAL | Status: DC
  Administered 2021-03-05 – 2021-03-20 (×16): 2.5 mg via ORAL
  Filled 2021-03-04 (×16): qty 1

## 2021-03-04 NOTE — Plan of Care (Signed)
  Problem: Consults Goal: RH STROKE PATIENT EDUCATION Description: See Patient Education module for education specifics  Outcome: Progressing   Problem: RH BOWEL ELIMINATION Goal: RH STG MANAGE BOWEL WITH ASSISTANCE Description: STG Manage Bowel with mod I Assistance. Outcome: Progressing Goal: RH STG MANAGE BOWEL W/MEDICATION W/ASSISTANCE Description: STG Manage Bowel with Medication with  mod I Assistance. Outcome: Progressing   Problem: RH SAFETY Goal: RH STG ADHERE TO SAFETY PRECAUTIONS W/ASSISTANCE/DEVICE Description: STG Adhere to Safety Precautions With cues/reminders  Assistance/Device. Outcome: Progressing   Problem: RH PAIN MANAGEMENT Goal: RH STG PAIN MANAGED AT OR BELOW PT'S PAIN GOAL Description: At or below level 4 Outcome: Progressing   Problem: RH KNOWLEDGE DEFICIT Goal: RH STG INCREASE KNOWLEDGE OF DIABETES Description: Patient and daughter will be able to manage DM with medications and dietary modifications using handouts and handbooks independently Outcome: Progressing Goal: RH STG INCREASE KNOWLEDGE OF HYPERTENSION Description: Patient and daughter will be able to manage HTN with medications and dietary modifications using handouts and handbooks independently Outcome: Progressing Goal: RH STG INCREASE KNOWLEGDE OF HYPERLIPIDEMIA Description: Patient and daughter will be able to manage HLD with medications and dietary modifications using handouts and handbooks independently Outcome: Progressing Goal: RH STG INCREASE KNOWLEDGE OF STROKE PROPHYLAXIS Description: Patient and daughter will be able to manage secondary stroke risks with medications and dietary modifications using handouts and handbooks independently Outcome: Progressing   

## 2021-03-04 NOTE — Progress Notes (Signed)
Occupational Therapy Session Note  Patient Details  Name: Carrie Kline MRN: [id number] Date of Birth: 12/19/1945  Today's Date: 03/04/2021 OT Individual Time: 0907-1000 OT Individual Time Calculation (min): 53 min    Short Term Goals: Week 1:  OT Short Term Goal 1 (Week 1): Pt will complete LB bathing with mod assist sit to stand for two consecutive sessions. OT Short Term Goal 2 (Week 1): Pt will complete LB dressing with mod assist for donning underpants and pants sit to stand. OT Short Term Goal 3 (Week 1): Pt will complete stand pivot transfer to the 3:1 with mod assist. OT Short Term Goal 4 (Week 1): Pt will use the RUE as a stabilizer with mod assist.  Skilled Therapeutic Interventions/Progress Updates:    Session 1: (0907-1000)  Pt in bed to start agreeable to toileting and B/D.  Mod assist for supine to sit with mod assist for squat pivot transfer to the wheelchair and then over to the 3:1 over the toilet with use of the grab bar.  She completed clothing management at mod assist as well in standing with mod assist to remove them completely from her LEs in preparation for bathing.  She was able to complete all bathing with mod assist sit to stand.  Max hand over hand assist for integration of the RUE at a gross assist for holding the soap to open as well as for washing the left arm.  Noted slight greater initiation of movement this session compared to yesterday.  Stand pivot transfer to the wheelchair with max assist for dressing at the sink.   Min assist for donning a pullover shirt with mod assist for donning brief and pants following hemi techniques.  She needed min assist for donning her socks and velcro shoes with use of a step stool.  Finished session with pt completing oral hygiene in sitting with setup only.  Nursing in room to finish with meds and setup of alarm belt and call button.    Session 2: ([id number])  Pt in bed to start session agreeable to therapy.  Mod assist for supine  to sit EOB with mod assist for squat pivot transfer to the wheelchair.  She was able to complete transfer stand pivot with max assist to the therapy mat.  Worked on RUE neuromuscular re-education with weightbearing to begin while completing functional reach with the LUE with the RUE on the therapy mat.  She needed max facilitation to maintain right elbow extension while reaching to the left.  Then worked on sit to squat transitions with mod assist increasing weightbearing through the RLE and RUE while picking up and placing clothespins with the LUE.  Transitioned to working on active right shoulder flexion with use of the tilted stool to activation.  Mod demonstrational cueing for avoiding compensatory strategies with her trunk and head.  Also had her work on shoulder flexion and external rotation with use of the [employer] and dycem to help maintain hand on the surface.  Finished session with transfer back to the wheelchair and return to the room.  NMES applied to the right shoulder for facilitation and treatment of subluxation.  [place]o Stim One used at the below parameters with intensity at 6 clicks for 60 min duration.  No redness or discomfort noted at this standing.  She was left in the wheelchair in preparation for next therapy session with the call button and phone in reach and half lap tray in place.    Sa[place]o Stim One  330 pulse width 35 Hz pulse rate On 8 sec/ off 8 sec Ramp up/ down 2 sec Symmetrical Biphasic wave form  Max intensity at 500 Ohm load   Therapy Documentation Precautions:  Precautions Precautions: Fall Precaution Comments: R hemiparesis Restrictions Weight Bearing Restrictions: No  Pain: Pain Assessment Pain Scale: Faces Pain Score: 0-No pain ADL: See Care Tool Section for some details of mobility and selfcare  Therapy/Group: Individual Therapy  Porchea Charrier OTR/L 03/04/2021, 12:21 PM

## 2021-03-04 NOTE — Progress Notes (Signed)
Physical Therapy Session Note  Patient Details  Name: Carrie Kline MRN: [id number] Date of Birth: 1945-10-30  Today's Date: 03/04/2021 PT Individual Time: 1430-1500 PT Individual Time Calculation (min): 30 min   Short Term Goals: Week 1:  PT Short Term Goal 1 (Week 1): Pt will complete bed mobility with MinA PT Short Term Goal 2 (Week 1): Pt will ambulate at least 30ft with ModA PT Short Term Goal 3 (Week 1): Pt will transfer bed to chair with MinA using squat pivot consistently PT Short Term Goal 4 (Week 1): Pt will got up 4 steps using handrails and ModA Week 2:    Week 3:     Skilled Therapeutic Interventions/Progress Updates:  Pain:  Pt reports headache pain, requested tylenol.  Transported via wc to nurses station and communicated request.  Nursing provided pain meds.  Pt transported to gym.  In parallel bars worked on midline standing posture, protraction of R hip/stands w/hip and shoulder retracted.  Placing and holding of midline followed by active hip protraction and disassociation of shoulders/hips in standing.  Pt rested in sitting. Standing w/R foot forward worked on wtshifting L to R and maintaining upright trunk while advancing R hip over foot, therapist guarding knee.  Pt w/limited tolerance stating "I am tired".  Returned to sitting. Pt then states "I just really don't feel good"and requests therapist assist her to bed.  Pt transported to room.   Squat pivot transfer w/mod assist of 1 and cues for seuencing.  Total assist to remove shoes.  Sit to sidelying on R w/cga, additional time to position RLE.  Pt positioned comfortably w/pillows supporting RUE/LE and needs in reach.  Bed alarm set.   30 min missed time due to headache pain.    Therapy Documentation Precautions:  Precautions Precautions: Fall Precaution Comments: R hemiparesis Restrictions Weight Bearing Restrictions: No    Therapy/Group: Individual Therapy [name], PT   [name] 03/04/2021, 3:21 PM

## 2021-03-04 NOTE — Progress Notes (Signed)
PROGRESS NOTE   Subjective/Complaints: Continues to have loose stool and abdominal pain from the metformin and would like it to be stopped. Discussed switching to glipizide and she is agreeable.   ROS: Patient denies fever, rash, sore throat, blurred vision, nausea, vomiting, cough, shortness of breath or chest pain, joint or back pain, headache, or mood change. +diarrhea  Objective:   No results found. Recent Labs    03/02/21 0859  WBC 7.5  HGB 13.6  HCT 41.0  PLT 225   Recent Labs    03/02/21 0859  NA 134*  K 3.9  CL 103  CO2 22  GLUCOSE 162*  BUN 27*  CREATININE 1.10*  CALCIUM 9.6    Intake/Output Summary (Last 24 hours) at 03/04/2021 1016 Last data filed at 03/03/2021 1826 Gross per 24 hour  Intake 240 ml  Output --  Net 240 ml        Physical Exam: Vital Signs Blood pressure 109/68, pulse 78, temperature 98 F (36.7 C), resp. rate 18, height 5\' 3"  (1.6 m), weight 90 kg, SpO2 99 %. Gen: no distress, normal appearing HEENT: oral mucosa pink and moist, NCAT Cardio: Reg rate Chest: normal effort, normal rate of breathing Abd: soft, non-distended Ext: no edema Psych: pleasant, normal affect Skin: No evidence of breakdown, no evidence of rash Neurologic: Cranial nerves II through XII intact, motor strength is 5/5 in left deltoid, bicep, tricep, grip, hip flexor, knee extensors, ankle dorsiflexor and plantar flexor 2/5 RUE bi, tri, 0/5 finger and wrist , 2- R thumb flexion and R hip /knee ext synergy, 0/5 distal RLE--stable exam Sensory exam normal sensation to light touch and proprioception in bilateral upper and lower extremities  Musculoskeletal: normal ROM, no pain    Assessment/Plan: 1. Functional deficits which require 3+ hours per day of interdisciplinary therapy in a comprehensive inpatient rehab setting. Physiatrist is providing close team supervision and 24 hour management of active medical  problems listed below. Physiatrist and rehab team continue to assess barriers to discharge/monitor patient progress toward functional and medical goals  Care Tool:  Bathing    Body parts bathed by patient: Right arm, Chest, Abdomen, Right upper leg, Left upper leg, Right lower leg, Face, Buttocks   Body parts bathed by helper: Buttocks, Left arm, Right lower leg, Front perineal area     Bathing assist Assist Level: Moderate Assistance - Patient 50 - 74%     Upper Body Dressing/Undressing Upper body dressing   What is the patient wearing?: Pull over shirt    Upper body assist Assist Level: Minimal Assistance - Patient > 75%    Lower Body Dressing/Undressing Lower body dressing      What is the patient wearing?: Incontinence brief, Pants     Lower body assist Assist for lower body dressing: Moderate Assistance - Patient 50 - 74%     Toileting Toileting    Toileting assist Assist for toileting: Moderate Assistance - Patient 50 - 74%     Transfers Chair/bed transfer  Transfers assist     Chair/bed transfer assist level: Moderate Assistance - Patient 50 - 74% (stand pivot to the right)     Locomotion Ambulation  Ambulation assist      Assist level: Maximal Assistance - Patient 25 - 49% Assistive device: Other (comment) (grab bar on the left) Max distance: 5'   Walk 10 feet activity   Assist     Assist level: 2 helpers Assistive device: Other (comment) (LUE on railing. Therapist on Right side)   Walk 50 feet activity   Assist Walk 50 feet with 2 turns activity did not occur: Safety/medical concerns         Walk 150 feet activity   Assist Walk 150 feet activity did not occur: Safety/medical concerns         Walk 10 feet on uneven surface  activity   Assist Walk 10 feet on uneven surfaces activity did not occur: Safety/medical concerns         Wheelchair     Assist Will patient use wheelchair at discharge?:  (TBD)    Wheelchair activity did not occur: N/A         Wheelchair 50 feet with 2 turns activity    Assist    Wheelchair 50 feet with 2 turns activity did not occur: N/A       Wheelchair 150 feet activity     Assist  Wheelchair 150 feet activity did not occur: N/A       Blood pressure 109/68, pulse 78, temperature 98 F (36.7 C), resp. rate 18, height 5\' 3"  (1.6 m), weight 90 kg, SpO2 99 %.  Medical Problem List and Plan: 1.  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  Antithrombotics: -DVT/anticoagulation: Lovenox             -antiplatelet therapy: Aspirin 81 mg daily and Plavix 75 mg daily x3 weeks and aspirin alone 3. Pain Management: N/A 4. Mood: Provide emotional support             -antipsychotic agents: N/A 5. Neuropsych: This patient is capable of making decisions on her own behalf. 6. Skin/Wound Care: Routine skin checks 7. Fluids/Electrolytes/Nutrition: Routine in and outs with follow-up chemistries 8.  New findings diabetes mellitus.  Hemoglobin A1c 7.4.  d/c glucophage given side effects. Already received today, start glipizide 2.5mg  tomorrow. Discussed minimizing added sugar and bread/pasta/rice- she is already doing. Diabetic teaching CBG (last 3)  Recent Labs    03/03/21 1631 03/03/21 2128 03/04/21 0603  GLUCAP 82 105* 122*  Controlled 7/8  9.  Hypertension.  Well controlled. Lisinopril 5 mg daily. Consider decreasing if creatinine continues to rise.  Vitals:   03/04/21 0407 03/04/21 0734  BP: 105/61 109/68  Pulse: 71 78  Resp: 18   Temp: 98 F (36.7 C)   SpO2: 99%    Controlled 7/8 10.  Hyperlipidemia.  Lipitor 11.  Thyroid nodule.  Follow-up outpatient 12. AKI: encouraged 6-8 glasses of water per day. Stop metformin. Repeat creatinine tomorrow.    LOS: 3 days A FACE TO FACE EVALUATION WAS  PERFORMED  Darling Cieslewicz P Woodward Klem 03/04/2021, 10:16 AM

## 2021-03-05 ENCOUNTER — Encounter (HOSPITAL_COMMUNITY): Payer: Self-pay | Admitting: Physical Medicine & Rehabilitation

## 2021-03-05 LAB — BASIC METABOLIC PANEL
Anion gap: 14 (ref 5–15)
BUN: 33 mg/dL — ABNORMAL HIGH (ref 8–23)
CO2: 18 mmol/L — ABNORMAL LOW (ref 22–32)
Calcium: 9.5 mg/dL (ref 8.9–10.3)
Chloride: 102 mmol/L (ref 98–111)
Creatinine, Ser: 1.13 mg/dL — ABNORMAL HIGH (ref 0.44–1.00)
GFR, Estimated: 51 mL/min — ABNORMAL LOW (ref >60)
Glucose, Bld: 116 mg/dL — ABNORMAL HIGH (ref 70–99)
Potassium: 5.3 mmol/L — ABNORMAL HIGH (ref 3.5–5.1)
Sodium: 134 mmol/L — ABNORMAL LOW (ref 135–145)

## 2021-03-05 LAB — GLUCOSE, CAPILLARY
Glucose-Capillary: 119 mg/dL — ABNORMAL HIGH (ref 70–99)
Glucose-Capillary: 82 mg/dL (ref 70–99)
Glucose-Capillary: 103 mg/dL — ABNORMAL HIGH (ref 70–99)
Glucose-Capillary: 139 mg/dL — ABNORMAL HIGH (ref 70–99)

## 2021-03-05 MED ORDER — LISINOPRIL 2.5 MG PO TABS
2.5000 mg | ORAL_TABLET | Freq: Every day | ORAL | Status: DC
  Administered 2021-03-06 – 2021-03-15 (×10): 2.5 mg via ORAL
  Filled 2021-03-05 (×10): qty 1

## 2021-03-05 NOTE — Plan of Care (Signed)
  Problem: Consults Goal: RH STROKE PATIENT EDUCATION Description: See Patient Education module for education specifics  Outcome: Progressing   Problem: RH BOWEL ELIMINATION Goal: RH STG MANAGE BOWEL WITH ASSISTANCE Description: STG Manage Bowel with mod I Assistance. Outcome: Progressing Goal: RH STG MANAGE BOWEL W/MEDICATION W/ASSISTANCE Description: STG Manage Bowel with Medication with  mod I Assistance. Outcome: Progressing   Problem: RH SAFETY Goal: RH STG ADHERE TO SAFETY PRECAUTIONS W/ASSISTANCE/DEVICE Description: STG Adhere to Safety Precautions With cues/reminders  Assistance/Device. Outcome: Progressing   Problem: RH PAIN MANAGEMENT Goal: RH STG PAIN MANAGED AT OR BELOW PT'S PAIN GOAL Description: At or below level 4 Outcome: Progressing   Problem: RH KNOWLEDGE DEFICIT Goal: RH STG INCREASE KNOWLEDGE OF DIABETES Description: Patient and daughter will be able to manage DM with medications and dietary modifications using handouts and handbooks independently Outcome: Progressing Goal: RH STG INCREASE KNOWLEDGE OF HYPERTENSION Description: Patient and daughter will be able to manage HTN with medications and dietary modifications using handouts and handbooks independently Outcome: Progressing Goal: RH STG INCREASE KNOWLEGDE OF HYPERLIPIDEMIA Description: Patient and daughter will be able to manage HLD with medications and dietary modifications using handouts and handbooks independently Outcome: Progressing Goal: RH STG INCREASE KNOWLEDGE OF STROKE PROPHYLAXIS Description: Patient and daughter will be able to manage secondary stroke risks with medications and dietary modifications using handouts and handbooks independently Outcome: Progressing   

## 2021-03-05 NOTE — Progress Notes (Signed)
PROGRESS NOTE   Subjective/Complaints: CBGs better controlled Abdominal pain/diarrhea resolved Provided with list of foods for diabetes.  She has not other complaints  ROS: Patient denies fever, rash, sore throat, blurred vision, nausea, vomiting, cough, shortness of breath or chest pain, joint or back pain, headache, or mood change. +diarrhea- improved  Objective:   No results found. No results for input(s): WBC, HGB, HCT, PLT in the last 72 hours.  Recent Labs    03/05/21 0525  NA 134*  K 5.3*  CL 102  CO2 18*  GLUCOSE 116*  BUN 33*  CREATININE 1.13*  CALCIUM 9.5    Intake/Output Summary (Last 24 hours) at 03/05/2021 1756 Last data filed at 03/05/2021 1322 Gross per 24 hour  Intake 560 ml  Output --  Net 560 ml        Physical Exam: Vital Signs Blood pressure (!) 104/58, pulse 89, temperature 98.3 F (36.8 C), temperature source Oral, resp. rate 16, height 5\' 3"  (1.6 m), weight 90 kg, SpO2 100 %. Gen: no distress, normal appearing HEENT: oral mucosa pink and moist, NCAT Cardio: Reg rate Chest: normal effort, normal rate of breathing Abd: soft, non-distended Ext: no edema Psych: pleasant, normal affect Neurologic: Cranial nerves II through XII intact, motor strength is 5/5 in left deltoid, bicep, tricep, grip, hip flexor, knee extensors, ankle dorsiflexor and plantar flexor 2/5 RUE bi, tri, 0/5 finger and wrist , 2- R thumb flexion and R hip /knee ext synergy, 0/5 distal RLE--stable exam Sensory exam normal sensation to light touch and proprioception in bilateral upper and lower extremities  Musculoskeletal: normal ROM, no pain    Assessment/Plan: 1. Functional deficits which require 3+ hours per day of interdisciplinary therapy in a comprehensive inpatient rehab setting. Physiatrist is providing close team supervision and 24 hour management of active medical problems listed below. Physiatrist and  rehab team continue to assess barriers to discharge/monitor patient progress toward functional and medical goals  Care Tool:  Bathing    Body parts bathed by patient: Right arm, Chest, Abdomen, Right upper leg, Left upper leg, Right lower leg, Face, Buttocks   Body parts bathed by helper: Left arm, Front perineal area, Left lower leg     Bathing assist Assist Level: Moderate Assistance - Patient 50 - 74%     Upper Body Dressing/Undressing Upper body dressing   What is the patient wearing?: Pull over shirt    Upper body assist Assist Level: Minimal Assistance - Patient > 75%    Lower Body Dressing/Undressing Lower body dressing      What is the patient wearing?: Incontinence brief, Pants     Lower body assist Assist for lower body dressing: Moderate Assistance - Patient 50 - 74%     Toileting Toileting    Toileting assist Assist for toileting: Moderate Assistance - Patient 50 - 74%     Transfers Chair/bed transfer  Transfers assist     Chair/bed transfer assist level: Moderate Assistance - Patient 50 - 74% (squat pivot)     Locomotion Ambulation   Ambulation assist      Assist level: Maximal Assistance - Patient 25 - 49% Assistive device: Other (comment) (grab bar  on the left side) Max distance: 4'   Walk 10 feet activity   Assist     Assist level: 2 helpers Assistive device: Other (comment) (LUE on railing. Therapist on Right side)   Walk 50 feet activity   Assist Walk 50 feet with 2 turns activity did not occur: Safety/medical concerns         Walk 150 feet activity   Assist Walk 150 feet activity did not occur: Safety/medical concerns         Walk 10 feet on uneven surface  activity   Assist Walk 10 feet on uneven surfaces activity did not occur: Safety/medical concerns         Wheelchair     Assist Will patient use wheelchair at discharge?:  (TBD)   Wheelchair activity did not occur: N/A         Wheelchair  50 feet with 2 turns activity    Assist    Wheelchair 50 feet with 2 turns activity did not occur: N/A       Wheelchair 150 feet activity     Assist  Wheelchair 150 feet activity did not occur: N/A       Blood pressure (!) 104/58, pulse 89, temperature 98.3 F (36.8 C), temperature source Oral, resp. rate 16, height 5\' 3"  (1.6 m), weight 90 kg, SpO2 100 %.  Medical Problem List and Plan: 1.  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  Antithrombotics: -DVT/anticoagulation: Lovenox             -antiplatelet therapy: Aspirin 81 mg daily and Plavix 75 mg daily x3 weeks and aspirin alone 3. Pain Management: N/A 4. Mood: Provide emotional support             -antipsychotic agents: N/A 5. Neuropsych: This patient is capable of making decisions on her own behalf. 6. Skin/Wound Care: Routine skin checks 7. Fluids/Electrolytes/Nutrition: Routine in and outs with follow-up chemistries 8.  New findings diabetes mellitus.  Hemoglobin A1c 7.4.  d/c glucophage given side effects. Already received today, start glipizide 2.5mg  tomorrow. Discussed minimizing added sugar and bread/pasta/rice- she is already doing. Diabetic teaching. CBGs improved. Provided list of foods good for diabetes.  CBG (last 3)  Recent Labs    03/05/21 0614 03/05/21 1127 03/05/21 1624  GLUCAP 119* 82 103*  Controlled 7/10  9.  Hypertension.  Well controlled. Decrease lisinopril to 2.5mg  given hypotension and AKI.  Consider decreasing if creatinine continues to rise.  Vitals:   03/05/21 0416 03/05/21 1416  BP: 131/72 (!) 104/58  Pulse: 75 89  Resp: 18 16  Temp: 98.6 F (37 C) 98.3 F (36.8 C)  SpO2: 100% 100%   Controlled 7/8 10.  Hyperlipidemia.  Lipitor 11.  Thyroid nodule.  Follow-up outpatient 12. AKI: encouraged 6-8 glasses of water per day. Stop metformin.  Decrease lisinopril to 2.5mg . Monitor creatinine.     LOS: 4 days A FACE TO FACE EVALUATION WAS PERFORMED  05/06/21 Isabellarose Kope 03/05/2021, 5:56 PM

## 2021-03-05 NOTE — Progress Notes (Signed)
Occupational Therapy Session Note  Patient Details  Name: Carrie Kline MRN: [id number] Date of Birth: December 24, 1945  Today's Date: 03/05/2021 OT Individual Time: 0900-1000 OT Individual Time Calculation (min): 60 min    Short Term Goals: Week 1:  OT Short Term Goal 1 (Week 1): Pt will complete LB bathing with mod assist sit to stand for two consecutive sessions. OT Short Term Goal 2 (Week 1): Pt will complete LB dressing with mod assist for donning underpants and pants sit to stand. OT Short Term Goal 3 (Week 1): Pt will complete stand pivot transfer to the 3:1 with mod assist. OT Short Term Goal 4 (Week 1): Pt will use the RUE as a stabilizer with mod assist.  Skilled Therapeutic Interventions/Progress Updates:    Pt resting in w/c upon arrival. Pt declined bathing/dressing this morning. OT intervention with focus on functional transfers and RUE NMR/activity. Squat pivot transfers with mod A. RUE NMR per below. Focus on scapula protraction/retraction with gravity eliminated by stool (pushing and pulling stool to body). Max multiomodal/tactile cues for technique/muscle group isolation. Weight bearing through RUE while functionally using LUE to retrieve items across midline. Attempted same movements with RUE on table but unsuccessful. Pt returned to room and remained in w/c with belt alarm activated. All needs within reach.   Therapy Documentation Precautions:  Precautions Precautions: Fall Precaution Comments: R hemiparesis Restrictions Weight Bearing Restrictions: No   Pain: Pt c/o "slight" headache; RN admin meds at end of session   Other Treatments: Treatments Neuromuscular Facilitation: Right;Upper Extremity;Activity to increase motor control;Activity to increase timing and sequencing;Activity to increase sustained activation Weight Bearing Technique Weight Bearing Technique: Yes RUE Weight Bearing Technique: Extended arm seated Response to Weight Bearing Technique: improved  muscile activation with shoulder flexion/extension and horizontal adduction   Therapy/Group: Individual Therapy  Rich Brave 03/05/2021, 10:04 AM

## 2021-03-06 LAB — GLUCOSE, CAPILLARY
Glucose-Capillary: 129 mg/dL — ABNORMAL HIGH (ref 70–99)
Glucose-Capillary: 66 mg/dL — ABNORMAL LOW (ref 70–99)
Glucose-Capillary: 73 mg/dL (ref 70–99)
Glucose-Capillary: 110 mg/dL — ABNORMAL HIGH (ref 70–99)
Glucose-Capillary: 125 mg/dL — ABNORMAL HIGH (ref 70–99)

## 2021-03-06 MED ORDER — SODIUM CHLORIDE 0.45 % IV SOLN: INTRAVENOUS | Status: DC

## 2021-03-06 NOTE — Progress Notes (Signed)
Occupational Therapy Session Note  Patient Details  Name: Carrie Kline MRN: [id number] Date of Birth: 04-30-1946  Today's Date: 03/06/2021 OT Individual Time: 1350-1430 OT Individual Time Calculation (min): 40 min    Short Term Goals: Week 1:  OT Short Term Goal 1 (Week 1): Pt will complete LB bathing with mod assist sit to stand for two consecutive sessions. OT Short Term Goal 2 (Week 1): Pt will complete LB dressing with mod assist for donning underpants and pants sit to stand. OT Short Term Goal 3 (Week 1): Pt will complete stand pivot transfer to the 3:1 with mod assist. OT Short Term Goal 4 (Week 1): Pt will use the RUE as a stabilizer with mod assist.  Skilled Therapeutic Interventions/Progress Updates:    Pt greeted in the bed, agreeable to tx and requesting to use the restroom. Mod A for supine<sit. Pt able to kick out her Rt LE when assisting OT don shoes. Mod A for squat pivot<w/c<elevated drop arm commode over toilet. Min A for sit<stands with Lt side supported during toileting tasks. Worked on pt increasing Rt sided weight shifting in standing for NMR. She wasn't able to void bowels but did void bladder. Mod A for hygiene. Had her drink some prune juice with immediate emesis. Notified RN and pt denied nausea afterwards. After returning to the w/c pt completed handwashing while sitting at the sink. Min cues for joint protection as she tended to lift her Rt hand by the digits vs wrist. We used dycem to help keep her Rt arm supported on the sink. Pt remained sitting up in the w/c at close of session, all needs within reach, safety belt fastened, and half lap tray applied to w/c.   Therapy Documentation Precautions:  Precautions Precautions: Fall Precaution Comments: R hemiparesis Restrictions Weight Bearing Restrictions: No  Vital Signs: Therapy Vitals Temp: 97.8 F (36.6 C) Temp Source: Oral Pulse Rate: 71 Resp: 18 BP: (!) 98/54 Patient Position (if appropriate):  Lying Oxygen Therapy SpO2: 100 % O2 Device: Room Air Pain: pt denied having pain during tx   ADL: ADL Eating: Supervision/safety Where Assessed-Eating: Wheelchair Grooming: Supervision/safety Where Assessed-Grooming: Wheelchair Upper Body Bathing: Minimal assistance Where Assessed-Upper Body Bathing: Edge of bed Lower Body Bathing: Maximal assistance Where Assessed-Lower Body Bathing: Edge of bed Upper Body Dressing: Contact guard Where Assessed-Upper Body Dressing: Edge of bed Lower Body Dressing: Maximal assistance Where Assessed-Lower Body Dressing: Edge of bed Toileting: Maximal assistance Where Assessed-Toileting: Bedside Commode Toilet Transfer: Maximal assistance Toilet Transfer Method: Surveyor, minerals: Animator Transfer: Not assessed Film/video editor: Not assessed     Therapy/Group: Individual Therapy  Ian Castagna A Jc Veron 03/06/2021, 3:46 PM

## 2021-03-06 NOTE — Progress Notes (Signed)
Occupational Therapy Session Note  Patient Details  Name: Carrie Kline MRN: [id number] Date of Birth: 10/04/1945  Today's Date: 03/06/2021 OT Individual Time: 0756-0900   &  1015-1100 OT Individual Time Calculation (min): 64 min   &  45 min   Short Term Goals: Week 1:  OT Short Term Goal 1 (Week 1): Pt will complete LB bathing with mod assist sit to stand for two consecutive sessions. OT Short Term Goal 2 (Week 1): Pt will complete LB dressing with mod assist for donning underpants and pants sit to stand. OT Short Term Goal 3 (Week 1): Pt will complete stand pivot transfer to the 3:1 with mod assist. OT Short Term Goal 4 (Week 1): Pt will use the RUE as a stabilizer with mod assist.  Skilled Therapeutic Interventions/Progress Updates:    Session 1:   Patient in bed, alert and ready for therapy.  She denies pain and requests a shower.  Supine to sitting edge of bed with minA.  Sit pivot transfers to/from bed, drop arm commode, shower bench and w/c with minA.  Toileting completed with min/mod A.  Bathing seated on shower bench min/mod A.  Dressing completed seated in w/c - OH shirt min A, incontinence brief and pants mod A, socks/shoes min/mod A.  Oral care completed w/c level with set up.   Sit pivot transfer to therapy mat with min A.  Completed unsupported sitting/posture, balance and right UE scapular and shoulder NMRE activities with good tolerance.  Hand off to PT at close of session.     Session 2:   Patient seated in w/c, ready for session.  SPT to/from w/c and commode with minA, toileting completed with min A.   Provided with dycem and theraputty.  Reviewed various uses for dycem in functional context with good understanding demonstrated.  SPT to/from mat table with min/mod a.  Sit to supine with min A - completed right arm NMRE in supine and seated positions with focus on facilitation of extensors - she is able to extend elbow against gravity approx 50%, ER in gravity eliminated plane  50%.  Completed hand AROM with theraputty.  Returned to bed level at close of session with min A.  Bed alarm set and call bell in hand.      Therapy Documentation Precautions:  Precautions Precautions: Fall Precaution Comments: R hemiparesis Restrictions Weight Bearing Restrictions: No   Therapy/Group: Individual Therapy  Barrie Lyme 03/06/2021, 7:34 AM

## 2021-03-06 NOTE — Progress Notes (Signed)
Physical Therapy Session Note  Patient Details  Name: Carrie Kline MRN: [id number] Date of Birth: 1946/07/12  Today's Date: 03/06/2021 PT Individual Time: 1027-2536 and 900-930 PT Individual Time Calculation (min): 32 min and  Short Term Goals: Week 1:  PT Short Term Goal 1 (Week 1): Pt will complete bed mobility with MinA PT Short Term Goal 2 (Week 1): Pt will ambulate at least 24ft with ModA PT Short Term Goal 3 (Week 1): Pt will transfer bed to chair with MinA using squat pivot consistently PT Short Term Goal 4 (Week 1): Pt will got up 4 steps using handrails and ModA  Skilled Therapeutic Interventions/Progress Updates:    AM SESSION PAIN denies pain Pt received as handoff in gym from OT.   Parallel bars: Sit to stand w/cues to focus on maintaining midline/wbing thru bilat Les w/transition. Gait:  94ft in parallel bars, advances RLE using trunk extension to facilitate initiation, footflat initial contact, mild hyperextension w/loading, retraction of L hip and trunk flexion/rotation to L thru midstance.  Worked on glut activation and maintaining erect trunk w/loading thru midstance via verbal, visual, and tactile cues  Gait in hallway: 30 feet w/mod assist for RLE stabilization, facilitation of above.  Pt verbalizes instruction aloud during last 46feet of gait to "self cue" w/good carryover and improved gait pattern w/stance phase of gait.  Primarily step to gait at this time.  Pt ttransported to room.  Pt left oob in wc w/alarm belt set and needs in reach   PM SESSION PAIN denies pain but states she is very tired.  Treatment to tolerance.  Rest breaks given as needed.  Pt initially oob in wc w/daughter at bedside.  Transported to gym for session. Sit to stand w/min assist and cues for hand placement, post bias requires min assist to steady in standing. Gait 50ft w/L handrail, cues for advancement of R hip at loading to midstance, step to gait w/LLE, cues for erect trunk and  to prevent flexion/rotation to L.  Poor clearance, consider acewrap assist for DF to improve clearance.  Seated hip abd/add w/tactile cues and resistance to LLE to allow overflow to R W/foot on bolster and mirror for feedback worked on knee flexion/ext via rolling bolster w/tactile cueing, facilitation of AAROM. Post wc propulsion using RLE w/therapist placing limb in knee flexion, pt able to extend at knee and hip to power wc 5 ft x 3. stand pivot transfer wc to bed to L w/max verbal cueing for seuencing, safety, mod assist. Sit to supine w/cues for sequencing, mod assist. Pt left supine w/rails up x 3, alarm set, bed in lowest position, and needs in reach.      Therapy Documentation Precautions:  Precautions Precautions: Fall Precaution Comments: R hemiparesis Restrictions Weight Bearing Restrictions: No     Therapy/Group: Individual Therapy [name], PT   [name] 03/06/2021, 3:56 PM

## 2021-03-06 NOTE — Plan of Care (Signed)
  Problem: Consults Goal: RH STROKE PATIENT EDUCATION Description: See Patient Education module for education specifics  Outcome: Progressing   Problem: RH BOWEL ELIMINATION Goal: RH STG MANAGE BOWEL WITH ASSISTANCE Description: STG Manage Bowel with mod I Assistance. Outcome: Progressing Goal: RH STG MANAGE BOWEL W/MEDICATION W/ASSISTANCE Description: STG Manage Bowel with Medication with  mod I Assistance. Outcome: Progressing   Problem: RH SAFETY Goal: RH STG ADHERE TO SAFETY PRECAUTIONS W/ASSISTANCE/DEVICE Description: STG Adhere to Safety Precautions With cues/reminders  Assistance/Device. Outcome: Progressing   Problem: RH PAIN MANAGEMENT Goal: RH STG PAIN MANAGED AT OR BELOW PT'S PAIN GOAL Description: At or below level 4 Outcome: Progressing   Problem: RH KNOWLEDGE DEFICIT Goal: RH STG INCREASE KNOWLEDGE OF DIABETES Description: Patient and daughter will be able to manage DM with medications and dietary modifications using handouts and handbooks independently Outcome: Progressing Goal: RH STG INCREASE KNOWLEDGE OF HYPERTENSION Description: Patient and daughter will be able to manage HTN with medications and dietary modifications using handouts and handbooks independently Outcome: Progressing Goal: RH STG INCREASE KNOWLEGDE OF HYPERLIPIDEMIA Description: Patient and daughter will be able to manage HLD with medications and dietary modifications using handouts and handbooks independently Outcome: Progressing Goal: RH STG INCREASE KNOWLEDGE OF STROKE PROPHYLAXIS Description: Patient and daughter will be able to manage secondary stroke risks with medications and dietary modifications using handouts and handbooks independently Outcome: Progressing

## 2021-03-06 NOTE — Progress Notes (Signed)
PROGRESS NOTE   Subjective/Complaints: Became nauseated when up after drinking prune juice No abd pain  Had a cont BM today   ROS: Patient denies CP, SOB, N/V/D Objective:   No results found. No results for input(s): WBC, HGB, HCT, PLT in the last 72 hours.  Recent Labs    03/05/21 0525  NA 134*  K 5.3*  CL 102  CO2 18*  GLUCOSE 116*  BUN 33*  CREATININE 1.13*  CALCIUM 9.5     Intake/Output Summary (Last 24 hours) at 03/06/2021 1143 Last data filed at 03/06/2021 0900 Gross per 24 hour  Intake 680 ml  Output 1 ml  Net 679 ml         Physical Exam: Vital Signs Blood pressure 133/75, pulse 81, temperature 98.5 F (36.9 C), temperature source Oral, resp. rate 16, height 5\' 3"  (1.6 m), weight 90 kg, SpO2 99 %.  General: No acute distress Mood and affect are appropriate Heart: Regular rate and rhythm no rubs murmurs or extra sounds Lungs: Clear to auscultation, breathing unlabored, no rales or wheezes Abdomen: Positive bowel sounds, soft nontender to palpation, nondistended Extremities: No clubbing, cyanosis, or edema Skin: No evidence of breakdown, no evidence of rash   Neurologic: Cranial nerves II through XII intact, motor strength is 5/5 in left deltoid, bicep, tricep, grip, hip flexor, knee extensors, ankle dorsiflexor and plantar flexor 2/5 RUE bi, tri, 0/5 finger and wrist , 2- R thumb flexion and R hip /knee ext synergy, 0/5 distal RLE--stable exam Sensory exam normal sensation to light touch and proprioception in bilateral upper and lower extremities Neg mystagmus  Musculoskeletal: normal ROM, no pain    Assessment/Plan: 1. Functional deficits which require 3+ hours per day of interdisciplinary therapy in a comprehensive inpatient rehab setting. Physiatrist is providing close team supervision and 24 hour management of active medical problems listed below. Physiatrist and rehab team continue to  assess barriers to discharge/monitor patient progress toward functional and medical goals  Care Tool:  Bathing    Body parts bathed by patient: Right arm, Chest, Abdomen, Right upper leg, Left upper leg, Right lower leg, Face, Buttocks   Body parts bathed by helper: Left arm, Front perineal area, Left lower leg     Bathing assist Assist Level: Moderate Assistance - Patient 50 - 74%     Upper Body Dressing/Undressing Upper body dressing   What is the patient wearing?: Pull over shirt    Upper body assist Assist Level: Minimal Assistance - Patient > 75%    Lower Body Dressing/Undressing Lower body dressing      What is the patient wearing?: Incontinence brief, Pants     Lower body assist Assist for lower body dressing: Moderate Assistance - Patient 50 - 74%     Toileting Toileting    Toileting assist Assist for toileting: Moderate Assistance - Patient 50 - 74%     Transfers Chair/bed transfer  Transfers assist     Chair/bed transfer assist level: Moderate Assistance - Patient 50 - 74% (squat pivot)     Locomotion Ambulation   Ambulation assist      Assist level: Maximal Assistance - Patient 25 - 49% Assistive  device: Other (comment) (grab bar on the left side) Max distance: 4'   Walk 10 feet activity   Assist     Assist level: 2 helpers Assistive device: Other (comment) (LUE on railing. Therapist on Right side)   Walk 50 feet activity   Assist Walk 50 feet with 2 turns activity did not occur: Safety/medical concerns         Walk 150 feet activity   Assist Walk 150 feet activity did not occur: Safety/medical concerns         Walk 10 feet on uneven surface  activity   Assist Walk 10 feet on uneven surfaces activity did not occur: Safety/medical concerns         Wheelchair     Assist Will patient use wheelchair at discharge?:  (TBD)   Wheelchair activity did not occur: N/A         Wheelchair 50 feet with 2 turns  activity    Assist    Wheelchair 50 feet with 2 turns activity did not occur: N/A       Wheelchair 150 feet activity     Assist  Wheelchair 150 feet activity did not occur: N/A       Blood pressure 133/75, pulse 81, temperature 98.5 F (36.9 C), temperature source Oral, resp. rate 16, height 5\' 3"  (1.6 m), weight 90 kg, SpO2 99 %.  Medical Problem List and Plan: 1.  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  Antithrombotics: -DVT/anticoagulation: Lovenox             -antiplatelet therapy: Aspirin 81 mg daily and Plavix 75 mg daily x3 weeks and aspirin alone 3. Pain Management: N/A 4. Mood: Provide emotional support             -antipsychotic agents: N/A 5. Neuropsych: This patient is capable of making decisions on her own behalf. 6. Skin/Wound Care: Routine skin checks 7. Fluids/Electrolytes/Nutrition: Routine in and outs with follow-up chemistries 8.  New findings diabetes mellitus.  Hemoglobin A1c 7.4.  d/c glucophage given side effects. Already received today, start glipizide 2.5mg  tomorrow. Discussed minimizing added sugar and bread/pasta/rice- she is already doing. Diabetic teaching. CBGs improved. Provided list of foods good for diabetes.  CBG (last 3)  Recent Labs    03/05/21 2057 03/06/21 0613 03/06/21 1141  GLUCAP 139* 129* 73   Low pre lunch CBG, received 2 U Novalog this am , will d/c SSI  9.  Hypertension.  Well controlled. Decrease lisinopril to 2.5mg  given hypotension and AKI.  Consider decreasing if creatinine continues to rise.  Vitals:   03/05/21 2001 03/06/21 0418  BP: (!) 98/55 133/75  Pulse: 83 81  Resp: 16 16  Temp: 98.2 F (36.8 C) 98.5 F (36.9 C)  SpO2: 99% 99%   Controlled 7/11, now on low dose lisinopril 10.  Hyperlipidemia.  Lipitor 11.  Thyroid nodule.  Follow-up outpatient 12. AKI:  encouraged 6-8 glasses of water per day. Stop metformin. Decrease lisinopril to 2.5mg . Monitor creatinine.  Will start IVF at noc     LOS: 5 days A FACE TO FACE EVALUATION WAS PERFORMED  9/11 03/06/2021, 11:43 AM

## 2021-03-07 LAB — GLUCOSE, CAPILLARY
Glucose-Capillary: 121 mg/dL — ABNORMAL HIGH (ref 70–99)
Glucose-Capillary: 78 mg/dL (ref 70–99)
Glucose-Capillary: 79 mg/dL (ref 70–99)
Glucose-Capillary: 102 mg/dL — ABNORMAL HIGH (ref 70–99)

## 2021-03-07 NOTE — Progress Notes (Signed)
PROGRESS NOTE   Subjective/Complaints: Still feels tired.  We discussed her dehydration as well as the effects of the stroke.  No bowel or bladder issues.  Did urinate more last night due to the IV fluids  ROS: Patient denies CP, SOB, N/V/D Objective:   No results found. No results for input(s): WBC, HGB, HCT, PLT in the last 72 hours.  Recent Labs    03/05/21 0525  NA 134*  K 5.3*  CL 102  CO2 18*  GLUCOSE 116*  BUN 33*  CREATININE 1.13*  CALCIUM 9.5     Intake/Output Summary (Last 24 hours) at 03/07/2021 0902 Last data filed at 03/07/2021 0811 Gross per 24 hour  Intake 1433.81 ml  Output --  Net 1433.81 ml         Physical Exam: Vital Signs Blood pressure 125/70, pulse 75, temperature 98.5 F (36.9 C), temperature source Oral, resp. rate 17, height 5\' 3"  (1.6 m), weight 90 kg, SpO2 96 %.  General: No acute distress Mood and affect are appropriate Heart: Regular rate and rhythm no rubs murmurs or extra sounds Lungs: Clear to auscultation, breathing unlabored, no rales or wheezes Abdomen: Positive bowel sounds, soft nontender to palpation, nondistended Extremities: No clubbing, cyanosis, or edema Skin: No evidence of breakdown, no evidence of rash   Neurologic: Cranial nerves II through XII intact, motor strength is 5/5 in left deltoid, bicep, tricep, grip, hip flexor, knee extensors, ankle dorsiflexor and plantar flexor 2/5 RUE bi, tri, 0/5 finger and wrist , 2- R thumb flexion and R hip /knee ext synergy, 0/5 distal RLE--stable exam Sensory exam normal sensation to light touch and proprioception in bilateral upper and lower extremities Neg mystagmus  Musculoskeletal: normal ROM, no pain    Assessment/Plan: 1. Functional deficits which require 3+ hours per day of interdisciplinary therapy in a comprehensive inpatient rehab setting. Physiatrist is providing close team supervision and 24 hour  management of active medical problems listed below. Physiatrist and rehab team continue to assess barriers to discharge/monitor patient progress toward functional and medical goals  Care Tool:  Bathing    Body parts bathed by patient: Right arm, Chest, Abdomen, Right upper leg, Left upper leg, Right lower leg, Face, Buttocks, Left arm, Front perineal area, Left lower leg   Body parts bathed by helper: Left arm, Left lower leg, Buttocks     Bathing assist Assist Level: Minimal Assistance - Patient > 75%     Upper Body Dressing/Undressing Upper body dressing   What is the patient wearing?: Pull over shirt    Upper body assist Assist Level: Minimal Assistance - Patient > 75%    Lower Body Dressing/Undressing Lower body dressing      What is the patient wearing?: Pants, Incontinence brief     Lower body assist Assist for lower body dressing: Moderate Assistance - Patient 50 - 74%     Toileting Toileting    Toileting assist Assist for toileting: Moderate Assistance - Patient 50 - 74%     Transfers Chair/bed transfer  Transfers assist     Chair/bed transfer assist level: Moderate Assistance - Patient 50 - 74% (squat pivot)     Locomotion Ambulation  Ambulation assist      Assist level: Moderate Assistance - Patient 50 - 74% Assistive device: Other (comment) (hallway rail) Max distance: 30   Walk 10 feet activity   Assist     Assist level: Moderate Assistance - Patient - 50 - 74% Assistive device: Other (comment)   Walk 50 feet activity   Assist Walk 50 feet with 2 turns activity did not occur: Safety/medical concerns         Walk 150 feet activity   Assist Walk 150 feet activity did not occur: Safety/medical concerns         Walk 10 feet on uneven surface  activity   Assist Walk 10 feet on uneven surfaces activity did not occur: Safety/medical concerns         Wheelchair     Assist Will patient use wheelchair at  discharge?:  (TBD)   Wheelchair activity did not occur: N/A         Wheelchair 50 feet with 2 turns activity    Assist    Wheelchair 50 feet with 2 turns activity did not occur: N/A       Wheelchair 150 feet activity     Assist  Wheelchair 150 feet activity did not occur: N/A       Blood pressure 125/70, pulse 75, temperature 98.5 F (36.9 C), temperature source Oral, resp. rate 17, height 5\' 3"  (1.6 m), weight 90 kg, SpO2 96 %.  Medical Problem List and Plan: 1.  Right side weakness secondary to left pontine infarct             -patient may  shower             -ELOS/Goals: 2-3wks MinA/Sup  -Continue CIR therapies including PT, OT, and SLP, team conference in a.m. 2.  Antithrombotics: -DVT/anticoagulation: Lovenox             -antiplatelet therapy: Aspirin 81 mg daily and Plavix 75 mg daily x3 weeks and aspirin alone 3. Pain Management: N/A 4. Mood: Provide emotional support             -antipsychotic agents: N/A 5. Neuropsych: This patient is capable of making decisions on her own behalf. 6. Skin/Wound Care: Routine skin checks 7. Fluids/Electrolytes/Nutrition: Routine in and outs with follow-up chemistries 8.  New findings diabetes mellitus.  Hemoglobin A1c 7.4.  d/c glucophage given side effects. Already received today, start glipizide 2.5mg  tomorrow. Discussed minimizing added sugar and bread/pasta/rice- she is already doing. Diabetic teaching. CBGs improved. Provided list of foods good for diabetes.  CBG (last 3)  Recent Labs    03/06/21 1551 03/06/21 2102 03/07/21 0600  GLUCAP 110* 125* 121*   Controlled off SSI, on glipizide  9.  Hypertension.  Well controlled. Decrease lisinopril to 2.5mg  given hypotension and AKI.  Consider decreasing if creatinine continues to rise.  Vitals:   03/06/21 1932 03/07/21 0525  BP: 109/72 125/70  Pulse: 80 75  Resp: 18 17  Temp: 98.5 F (36.9 C) 98.5 F (36.9 C)  SpO2: 99% 96%   Controlled 7/12, now on low dose  lisinopril 10.  Hyperlipidemia.  Lipitor 11.  Thyroid nodule.  Follow-up outpatient 12. AKI: encouraged 6-8 glasses of water per day. Stop metformin. Decrease lisinopril to 2.5mg . Monitor creatinine.  Will start IVF at noc - recheck BMET in am     LOS: 6 days A FACE TO FACE EVALUATION WAS PERFORMED  9/12 03/07/2021, 9:02 AM

## 2021-03-07 NOTE — Progress Notes (Signed)
Occupational Therapy Session Note  Patient Details  Name: Carrie Kline MRN: [id number] Date of Birth: 10-Oct-1945  Today's Date: 03/07/2021 OT Individual Time: 7616-0737 OT Individual Time Calculation (min): 62 min    Short Term Goals: Week 1:  OT Short Term Goal 1 (Week 1): Pt will complete LB bathing with mod assist sit to stand for two consecutive sessions. OT Short Term Goal 2 (Week 1): Pt will complete LB dressing with mod assist for donning underpants and pants sit to stand. OT Short Term Goal 3 (Week 1): Pt will complete stand pivot transfer to the 3:1 with mod assist. OT Short Term Goal 4 (Week 1): Pt will use the RUE as a stabilizer with mod assist.  Skilled Therapeutic Interventions/Progress Updates:    Patient in bed, alert and ready for therapy session.  She denies pain and requests a shower this am.  Iv site covered.  Supine to sitting edge of bed with CGA.  SPT bed to w/c , w/c to toilet min A.  Able to pull pants down and complete hygiene with CGA.  Short distance ambulation with RW toilet to shower bench with min/mod A.  She completed bathing sit and stand in shower with grab bars and hand held shower min A for right lower leg and left upper arm - CGA when in stance.  SPT shower seat to w/c min A.  Dressing completed w/c level - min A for [place] shirt, min A for underwear and pants, min A CM in stance.  Max A socks, min A shoes.  Good carryover of techniques such as use of dycem to improve ability to use right side during functional tasks.  Oral care seated at sink with set up.  Completed right UE AAROM.  She remained seated in w/c at close of session, seat belt alarm set and call bell in reach.    Therapy Documentation Precautions:  Precautions Precautions: Fall Precaution Comments: R hemiparesis Restrictions Weight Bearing Restrictions: No  Therapy/Group: Individual Therapy  Barrie Lyme 03/07/2021, 7:29 AM

## 2021-03-07 NOTE — Progress Notes (Signed)
Physical Therapy Session Note  Patient Details  Name: Carrie Kline MRN: [id number] Date of Birth: [birth year]  Today's Date: 03/07/2021 PT Individual Time: 1020-1133 PT Individual Time Calculation (min): 73 min   Short Term Goals: Week 1:  PT Short Term Goal 1 (Week 1): Pt will complete bed mobility with MinA PT Short Term Goal 2 (Week 1): Pt will ambulate at least 70ft with ModA PT Short Term Goal 3 (Week 1): Pt will transfer bed to chair with MinA using squat pivot consistently PT Short Term Goal 4 (Week 1): Pt will got up 4 steps using handrails and ModA  Skilled Therapeutic Interventions/Progress Updates:    Pt received sitting in w/c and agreeable to therapy session.  Transported to/from gym in w/c for time management and energy conservation. Sit>stand w/c>L UE support on armrest then hallway rail with min assist for lifting to stand and balance - cuing/facilitation for R LE WBing and hip/knee extension. Donned R LE DF assist ACE wrap (new technique to promote midline DF). Gait training 8ft at L hallway rail, min assist for R LE advancement and guarding during stance phase but no buckling noted - pt progressing towards reciprocal stepping pattern - continues to need cuing/facilitation for increased hip extension and upright posture. Transitioned to gait training 6ft next to L hallway rail but not using UE support with mod assist for balance and R LE management - requires min assist to advance R LE to increase step length and cuing for reciprocal stepping pattern with increased L LE step length as pt tending to perform step-to - requires more guarding/blocking of R knee during this with 1x mild buckle. Pt reports need to use bathroom. Transported back to room in w/c. Squat/stand pivot w/c<>BSC over toilet using grab bar support with min assist for balance and guarding R knee. Standing using L UE support on grab bar as needed with min assist for balance during total assist LB clothing management  at beginning due to urgency then only mod assist to manage R side pulling up at end - set-up assist peri-care with min assist for standing balance.   Transported back to main gym. Donned maxi-sky harness. Gait training 2x 22ft in maxi-sky including a 180degree turn with max assist on 1st trial due to pt unfamiliarity with task and feeling nervous and then only mod assist for balance during 2nd gait trail with pt demonstrating ability to advance R LE during swing with only min assist to promote increased step length and then requiring mod assist to promote increased R hip/knee extension during stance to allow increased L LE step length progressing towards reciprocal stepping pattern. Pt reporting significant fatigue but agreeable to continue with session.  R LE NMR via repeated sit<>stands to/from EOM without UE support and mirror feedback due to pt demonstrating significant compensatory patterns with only L LE WBing, cuing for midline orientation x8 reps. Doffed R LE ace wrap. Transported back to room and left sitting in w/c with needs in reach, seat belt alarm on, and R UE supported on 1/2 lap tray.   Therapy Documentation Precautions:  Precautions Precautions: Fall Precaution Comments: R hemiparesis Restrictions Weight Bearing Restrictions: No   Pain:  Denies pain during session.    Therapy/Group: Individual Therapy  Ginny Forth , PT, DPT, NCS, CSRS 03/07/2021, 7:57 AM

## 2021-03-07 NOTE — Progress Notes (Signed)
Occupational Therapy Session Note  Patient Details  Name: Carrie Kline MRN: [id number] Date of Birth: 03/28/1946  Today's Date: 03/07/2021 OT Individual Time: 1333-1430 OT Individual Time Calculation (min): 57 min    Short Term Goals: Week 1:  OT Short Term Goal 1 (Week 1): Pt will complete LB bathing with mod assist sit to stand for two consecutive sessions. OT Short Term Goal 2 (Week 1): Pt will complete LB dressing with mod assist for donning underpants and pants sit to stand. OT Short Term Goal 3 (Week 1): Pt will complete stand pivot transfer to the 3:1 with mod assist. OT Short Term Goal 4 (Week 1): Pt will use the RUE as a stabilizer with mod assist.  Skilled Therapeutic Interventions/Progress Updates:    Pt received in bathroom with NT and consented to OT tx. After toileting, pt stood in steady with CGA for balance to stand and complete hygiene, min A to hike pants. Pt is very fatigued but remains highly motivated to participate throughout session. Pt brought back to w/c and taken down to therapy gym, where she was instructed in towel slides for RUE to increase ROM, strength, and decrease risk for contractures. Pt instructed in trunk and shoulder flexion for 2x10, shoulder horizontal ab/adduction for 2x10 with multiple rest breaks as pt fatigues quickly. Pt instructed in shoulder ab/adduction and instructed to knock off bean bags off of table for 10x each side for increased RUE strength. Pt then trained in standing task in [place] with CGA. Instructed to reach with L hand to grab cards from L side and reach fwd to match them correctly to their pair on board in front of her. Pt stood with CGA in [place] and remained standing for entire activity, about 5 mins before requiring seated rest break. Pt then instructed to stand with mirror in front of her for visual feedback for proper posture alignment and to weight bear through R side. After tx, pt taken back to room, completed SPT with max A back to  bed, positioned comfortably and left with all needs met.   Therapy Documentation Precautions:  Precautions Precautions: Fall Precaution Comments: R hemiparesis Restrictions Weight Bearing Restrictions: No  Vital Signs: Therapy Vitals Temp: 98.5 F (36.9 C) Temp Source: Oral Pulse Rate: 75 Resp: 17 BP: 125/70 Patient Position (if appropriate): Lying Oxygen Therapy SpO2: 96 % Pain: no c/o pain during session      Therapy/Group: Individual Therapy  Megumi Treaster 03/07/2021, 7:43 AM

## 2021-03-08 LAB — GLUCOSE, CAPILLARY
Glucose-Capillary: 109 mg/dL — ABNORMAL HIGH (ref 70–99)
Glucose-Capillary: 70 mg/dL (ref 70–99)
Glucose-Capillary: 95 mg/dL (ref 70–99)
Glucose-Capillary: 97 mg/dL (ref 70–99)

## 2021-03-08 LAB — BASIC METABOLIC PANEL
Anion gap: 7 (ref 5–15)
BUN: 22 mg/dL (ref 8–23)
CO2: 21 mmol/L — ABNORMAL LOW (ref 22–32)
Calcium: 9.5 mg/dL (ref 8.9–10.3)
Chloride: 106 mmol/L (ref 98–111)
Creatinine, Ser: 1.02 mg/dL — ABNORMAL HIGH (ref 0.44–1.00)
GFR, Estimated: 58 mL/min — ABNORMAL LOW (ref >60)
Glucose, Bld: 115 mg/dL — ABNORMAL HIGH (ref 70–99)
Potassium: 5 mmol/L (ref 3.5–5.1)
Sodium: 134 mmol/L — ABNORMAL LOW (ref 135–145)

## 2021-03-08 NOTE — Progress Notes (Signed)
PROGRESS NOTE   Subjective/Complaints:  Limiting fluids so she does not urinate Discussed need to increase po fluids Reviewed improvements in repeat labwork ROS: Patient denies CP, SOB, N/V/D Objective:   No results found. No results for input(s): WBC, HGB, HCT, PLT in the last 72 hours.  Recent Labs    03/08/21 0655  NA 134*  K 5.0  CL 106  CO2 21*  GLUCOSE 115*  BUN 22  CREATININE 1.02*  CALCIUM 9.5     Intake/Output Summary (Last 24 hours) at 03/08/2021 1033 Last data filed at 03/08/2021 0858 Gross per 24 hour  Intake 1285.22 ml  Output --  Net 1285.22 ml         Physical Exam: Vital Signs Blood pressure 135/69, pulse 79, temperature 98.3 F (36.8 C), resp. rate 17, height 5' 3"  (1.6 m), weight 90 kg, SpO2 100 %.  General: No acute distress Mood and affect are appropriate Heart: Regular rate and rhythm no rubs murmurs or extra sounds Lungs: Clear to auscultation, breathing unlabored, no rales or wheezes Abdomen: Positive bowel sounds, soft nontender to palpation, nondistended Extremities: No clubbing, cyanosis, or edema Skin: No evidence of breakdown, no evidence of rash Neurologic: Cranial nerves II through XII intact, motor strength is 5/5 in left deltoid, bicep, tricep, grip, hip flexor, knee extensors, ankle dorsiflexor and plantar flexor 2/5 RUE bi, tri, 0/5 finger and wrist , 2- R thumb flexion and R hip /knee ext synergy, 0/5 distal RLE--stable exam Sensory exam normal sensation to light touch and proprioception in bilateral upper and lower extremities Neg mystagmus  Musculoskeletal: normal ROM, no pain    Assessment/Plan: 1. Functional deficits which require 3+ hours per day of interdisciplinary therapy in a comprehensive inpatient rehab setting. Physiatrist is providing close team supervision and 24 hour management of active medical problems listed below. Physiatrist and rehab team  continue to assess barriers to discharge/monitor patient progress toward functional and medical goals  Care Tool:  Bathing    Body parts bathed by patient: Right arm, Chest, Abdomen, Right upper leg, Left upper leg, Right lower leg, Face, Buttocks, Left arm, Front perineal area, Left lower leg   Body parts bathed by helper: Left arm, Right lower leg     Bathing assist Assist Level: Minimal Assistance - Patient > 75%     Upper Body Dressing/Undressing Upper body dressing   What is the patient wearing?: Pull over shirt    Upper body assist Assist Level: Minimal Assistance - Patient > 75%    Lower Body Dressing/Undressing Lower body dressing      What is the patient wearing?: Underwear/pull up, Pants     Lower body assist Assist for lower body dressing: Minimal Assistance - Patient > 75%     Toileting Toileting    Toileting assist Assist for toileting: Minimal Assistance - Patient > 75%     Transfers Chair/bed transfer  Transfers assist     Chair/bed transfer assist level: Moderate Assistance - Patient 50 - 74%     Locomotion Ambulation   Ambulation assist      Assist level: Moderate Assistance - Patient 50 - 74% Assistive device: Other (comment) (L hallway rail)  Max distance: 10f   Walk 10 feet activity   Assist     Assist level: Moderate Assistance - Patient - 50 - 74% Assistive device: Other (comment)   Walk 50 feet activity   Assist Walk 50 feet with 2 turns activity did not occur: Safety/medical concerns         Walk 150 feet activity   Assist Walk 150 feet activity did not occur: Safety/medical concerns         Walk 10 feet on uneven surface  activity   Assist Walk 10 feet on uneven surfaces activity did not occur: Safety/medical concerns         Wheelchair     Assist Will patient use wheelchair at discharge?:  (TBD)   Wheelchair activity did not occur: N/A         Wheelchair 50 feet with 2 turns  activity    Assist    Wheelchair 50 feet with 2 turns activity did not occur: N/A       Wheelchair 150 feet activity     Assist  Wheelchair 150 feet activity did not occur: N/A       Blood pressure 135/69, pulse 79, temperature 98.3 F (36.8 C), resp. rate 17, height 5' 3"  (1.6 m), weight 90 kg, SpO2 100 %.  Medical Problem List and Plan: 1.  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 Formulized individual treatment plan based on medical history, underlying problem and comorbidities.  2.  Antithrombotics: -DVT/anticoagulation: Lovenox             -antiplatelet therapy: Aspirin 81 mg daily and Plavix 75 mg daily x3 weeks and aspirin alone 3. Pain Management: N/A 4. Mood: Provide emotional support             -antipsychotic agents: N/A 5. Neuropsych: This patient is capable of making decisions on her own behalf. 6. Skin/Wound Care: Routine skin checks 7. Fluids/Electrolytes/Nutrition: Routine in and outs with follow-up chemistries 8.  New findings diabetes mellitus.  Hemoglobin A1c 7.4.  d/c glucophage given side effects. Already received today, start glipizide 2.559mtomorrow. Discussed minimizing added sugar and bread/pasta/rice- she is already doing. Diabetic teaching. CBGs improved. Provided list of foods good for diabetes.  CBG (last 3)  Recent Labs    03/07/21 1616 03/07/21 2109 03/08/21 0622  GLUCAP 79 102* 109*   Controlled off SSI, on glipizide  9.  Hypertension.  Well controlled. Decrease lisinopril to 2.75m91miven hypotension and AKI.  Consider decreasing if creatinine continues to rise.  Vitals:   03/07/21 1938 03/08/21 0452  BP: 118/77 135/69  Pulse: 81 79  Resp: 18 17  Temp:   98.3 F (36.8 C)  SpO2: 100% 100%   Controlled 7/13, now on low dose lisinopril 10.  Hyperlipidemia.  Lipitor 11.  Thyroid nodule.  Follow-up outpatient 12. AKI: encouraged 6-8 glasses of water per day. Stop metformin. Decrease lisinopril to 2.75mg22monitor creatinine.  recheck BMET looks beeter D/C IVF but will need to drink more     LOS: 7 days A FACE TO FACEBrandonirsteins 03/08/2021, 10:33 AM

## 2021-03-08 NOTE — Progress Notes (Signed)
Chaplain responded to consult for an AD. Chaplain carried it to patient's room and found out she is one of [employer]	[place]. Patient is going to go over the documents tonight and will let Chaplain know when she is ready to sign.    03/08/21 1600  Clinical Encounter Type  Visited With Patient  Visit Type Spiritual support;Other (Comment)  Referral From Nurse  Consult/Referral To Chaplain

## 2021-03-08 NOTE — Progress Notes (Signed)
Patient ID: Carrie Kline, female   DOB: [birth year], 75 y.o.   MRN: [id number] Team Conference Report to Patient/Family  Team Conference discussion was reviewed with the patient and caregiver, including goals, any changes in plan of care and target discharge date.  Patient and caregiver express understanding and are in agreement.  The patient has a target discharge date of 03/24/21.  Sw met with pt daughters, provided team conference updates. Happy with pt progress. No additional questions or concerns, sw will continue to follow up,  Dyanne Iha 03/08/2021, 1:09 PM

## 2021-03-08 NOTE — Progress Notes (Signed)
Pt has exceeded goal of 1057m PO. Will not infuse fluids tonight. Continue to encourage PO intake. All needs met at this time, Call bell in reach.

## 2021-03-08 NOTE — Progress Notes (Signed)
Occupational Therapy Weekly Progress Note  Patient Details  Name: Carrie Kline MRN: [id number] Date of Birth: [birth year]  Beginning of progress report period: March 02, 2021 End of progress report period: March 08, 2021  Today's Date: 03/08/2021 OT Individual Time: 4010-2725 OT Individual Time Calculation (min): 57 min    Patient has met 4 of 4 short term goals.  Pt is making steady progress with OT at this time.  She [place] able to complete UB selfcare at min assist level with mod assist for all LB selfcare.  Transfers to the 3:1 and the tub bench are at a mod assist stand pivot with min assist for squat pivot secondary to RLE weakness.  RUE function [place] currently Brunnstrum stage III movement in the arm and hand.  She needs min to mod assist to use as a stabilizer during selfcare tasks.  Endurance continues to be limited as well, requiring rest breaks during gym tasks or with bathing and dressing.  Feel she will continue to benefit from continued CIR level therapy to continue progression to min assist level goals.    Patient continues to demonstrate the following deficits: muscle weakness and muscle paralysis, impaired timing and sequencing, unbalanced muscle activation, and decreased coordination, and decreased standing balance, hemiplegia, and decreased balance strategies and therefore will continue to benefit from skilled OT intervention to enhance overall performance with BADL, iADL, and Reduce care partner burden.  Patient progressing toward long term goals..  Continue plan of care.  OT Short Term Goals Week 2:  OT Short Term Goal 1 (Week 2): Pt will complete UB bathing with supervision following hemi technique. OT Short Term Goal 2 (Week 2): Pt will complete UB dressing with supervision for two consecutive sessions. OT Short Term Goal 3 (Week 2): Pt will complete LB dressing with min assist sit to stand. OT Short Term Goal 4 (Week 2): Pt will complete toilet transfers with min assist stand  pivot. OT Short Term Goal 5 (Week 2): Pt will use the RUE as at gross assist with min assist with selfcare tasks.  Skilled Therapeutic Interventions/Progress Updates:     Session 1: (3664-4034)  Pt sitting on toilet to start session as NT had assisted her with use of the Stedy.  Had her complete toilet hygiene with mod assist in standing with transfer over to the tub bench at the same level with use of the grab bar on the left side.  She was able to remove all LB clothing with mod assist in preparation for shower with completion of all bathing at min assist level.  She completed stand pivot transfer to the wheelchair after drying off with mod assist in order to work on dressing tasks at the sink.  Min assist for donning pullover shirt following hemi techniques.  She was able to donn LB clothing crossing the RLE over the left knee and maintaining with mod assist and then stand at the same level in order to pull them up over her hips.  Mod assist for donning the right shoe and sock using one handed technique with supervision for the left.  She completed session sitting up in the wheelchair with the call button and phone in reach and safety alarm in place.    Session 2: [id number])  Pt complete RUE strengthening with use of the UE ergonometer during session.  Therapist had her completed 1 set of 3 mins to begin using BUEs, with ace wrap placed on the left hand to assist with sustained grip.  She then completed 3 sets of 2 mins each using the isolated RUE only and mod assist.  Resistance on all sets was placed at level 1.  Once complete, she was taken back to her room where she completed stand pivot toilet transfer and toileting tasks with mod assist.  Finished session with pt in the wheelchair and with the call button and phone in reach and safety belt in place.  RUE supported on half lap tray as well.   Therapy Do)cumentation Precautions:  Precautions Precautions: Fall Precaution Comments: R  hemiparesis Restrictions Weight Bearing Restrictions: No   Pain: Pain Assessment Pain Scale: Faces Pain Score: 0-No pain ADL: See Care Tool Section for some details of mobility and selfcare  Therapy/Group: Individual Therapy  Edi Gorniak 03/08/2021, 10:41 AM

## 2021-03-08 NOTE — Patient Care Conference (Signed)
Inpatient RehabilitationTeam Conference and Plan of Care Update Date: 03/08/2021   Time: 10:32 AM    Patient Name: Carrie Kline      Medical Record Number: [id number]  Date of Birth: 05/30/1946 Sex: Female         Room/Bed: 4W01C/4W01C-01 Payor Info: Payor: [employer]ICARE / Plan: BCBS MEDICARE / Product Type: *No Product type* /    Admit Date/Time:  03/01/2021  2:15 PM  Primary Diagnosis:  Brainstem infarct, acute [facility])  Hospital Problems: Principal Problem:   Brainstem infarct, acute [facility])    Expected Discharge Date: Expected Discharge Date: 03/24/21  Team Members Present: Physician leading conference: Dr. Claudette Laws Care Coodinator Present: Lavera Guise, BSW;Zona Pedro Lambert Mody, RN, BSN, CRRN Nurse Present: Chana Bode, RN PT Present: Casimiro Needle, PT OT Present: Perrin Maltese, OT SLP Present: Other (comment) Dalbert Mayotte, SLP) PPS Coordinator present : Fae Pippin, SLP     Current Status/Progress Goal Weekly Team Focus  Bowel/Bladder   cont of B/B last BM7/10  remain cont  assess q shift and PRN   Swallow/Nutrition/ Hydration             ADL's   SPT min A ,  upper body adl min A, lower body adl min/mod A, toileting min A, improving motor control on right side  CGA/S  right NMRE, adl and transfer training, general conditioning,   Mobility   mod assist bed mobility, mod assist squat pivot transfers, mod assist sit<>stand, mod assist gait up to 20ft using L hallway rail support  CGA overall at ambulatory level  R LE NMR, dynamic standing balance, gait training, activity tolerance, pt education   Communication             Safety/Cognition/ Behavioral Observations            Pain   C/o headache on 7/11, Tylenol effective. No further pain c/o  pain<3  assess pain q shift and PRN   Skin   intact  remain intact  assess skin q shift and PRN     Discharge Planning:  Patient discharging home with daughter and son to provide care 24/7    Team Discussion: Right hemi post pontine infarct. CBG controlled onoral agent. BP good. MD to d/c HS IVF ordered for dehydration due to poor po intake.  Patient on target to meet rehab goals: yes, currently min assist for upper body bathing and mod assist for lower body ADL. Requires mod assist for stand pivot transfers  and min assist for squat pivot transfers and able to ambulate 30-70'. Discharge goals set for CGA - Min guard assist level  *See Care Plan and progress notes for long and short-term goals.   Revisions to Treatment Plan:    Teaching Needs: Transfers, toileting, medications, secondary stroke risk management, etc  Current Barriers to Discharge: Decreased caregiver support and Home enviroment access/layout  Possible Resolutions to Barriers: Family education with son/daughter     Medical Summary Current Status: poor po intake, BMET improving on IVF, BP controlled  Barriers to Discharge: Medical stability;Other (comments);Nutrition means  Barriers to Discharge Comments: maintain hydration off IVF     Continued Need for Acute Rehabilitation Level of Care: The patient requires daily medical management by a physician with specialized training in physical medicine and rehabilitation for the following reasons: Direction of a multidisciplinary physical rehabilitation program to maximize functional independence : Yes Medical management of patient stability for increased activity during participation in an intensive rehabilitation regime.: Yes Analysis  of laboratory values and/or radiology reports with any subsequent need for medication adjustment and/or medical intervention. : Yes   I attest that I was present, lead the team conference, and concur with the assessment and plan of the team.   Chana Bode B 03/08/2021, 2:41 PM

## 2021-03-08 NOTE — Progress Notes (Signed)
Physical Therapy Session Note  Patient Details  Name: Carrie Kline MRN: [id number] Date of Birth: [birth year]  Today's Date: 03/08/2021 PT Individual Time: 7829-5621 and 3086-5784 PT Individual Time Calculation (min): 57 min and 29 min   Short Term Goals: Week 1:  PT Short Term Goal 1 (Week 1): Pt will complete bed mobility with MinA PT Short Term Goal 2 (Week 1): Pt will ambulate at least 54ft with ModA PT Short Term Goal 3 (Week 1): Pt will transfer bed to chair with MinA using squat pivot consistently PT Short Term Goal 4 (Week 1): Pt will got up 4 steps using handrails and ModA  Skilled Therapeutic Interventions/Progress Updates:    Session 1: Pt received sitting in w/c and agreeable to therapy session.  Transported to/from gym in w/c for time management and energy conservation. Sitting in w/c>tall kneeling on mat with mod assist for balance and R LE management onto mat - as soon as achieved tall kneeling position pt had significant cramp in R hamstrings with pain preventing pt from tolerating this position therefore had +2 assist in moving the bench support and therapist provided mod assist to transition from tall kneeling to lying on mat then returning to sitting EOM after pain dissipated. Deferred this task at this time (at end of session pt also started to have hamstring cramp when therapist facilitates R LE into hooklying to bridge and scoot towards [facility]).  Squat pivot transfers, biasing towards R side during session as able to promote R LE NMR via [place], with min assist for pivoting hips - facilitation for increased R LE WBing via limiting use of L UE during transfers. Sit<>stands to/from EOM with L HHA to promote R weight shift with min assist for balance and cuing for increased hip extension while coming to stand - mirror feedback - x12reps.  Standing R LE NMR targeting stance phase of gait via L LE lateral steps in/out progressed to L LE forward foot taps on/off 4" step - all without  UE support and mirror feedback - cuing for increased R hip and knee extension (predominantly needs increased hip extension) and R weight shift then sustaining this while stepping L LE 3x5 reps. Pt continues to require frequent seated rest breaks due to fatigue throughout session.  Donned R LE DF assist ACE wrap (new technique for neutral alignment, not excessive eversion) Gait training 24ft, 75ft next to L hallway rail for safety but not using it with mod assist of 1 for balance and R LE management - pt able to advance L LE with min assist for repositioning and increasing step length - therapist guarding R knee buckle during stance (no significant buckle noted) while facilitating increased forward pelvic translation over R stance to promote step-though pattern with LLE - pt able to recall and verbally state out loud the cues from standing R LE NMR task to help improve gait mechanics.   Reports need to use bathroom. Transported back to room. Stand pivot w/c<>BSC over toilet using L UE support on grab bar with min assist for balance - pt demos significant compensatory movement patterns with decreased R LE WBing. Standing with min assist for balance while completing LB clothing management min/mod assist for R side - continent of bladder and performed seated peri-care with set-up assist. At end of session pt left sitting in w/c with needs in reach, seat belt alarm on, and R UE supported on 1/2 lap tray.    Session 2: Pt received sitting in w/c with  her 2 daughters present and pt agreeable to therapy session.Transported to/from gym in w/c for time management and energy conservation. Donned R LE DF assist ACE wrap (new technique). Sit>stand w/c>no UE support but next to L hallway rail for safety with min assist for balance - cuing for increased hip extension and R knee extension. Gait training 44ft next to L hallway rail for safety but not using UE support with mod assist of 1 for balance and R LE management - able  to advance R LE with only min assist for increased step length and to facilitate heel strike, min/mod assist for R stability during stance phase facilitating increased R hip/knee extension (guarding for knee buckle but no significant buckling noted). Transported back to room. R squat pivot w/c>EOB with min assist for pivoting hips - decreasing L UE support to promote increased R LE WBing. Sit>supine min assist for R hemibody management. Pt left R sidelying in bed with needs in reach and pillows positioned for improved R hemibody alignment - needs in reach, bed alarm on, and family present.  Therapy Documentation Precautions:  Precautions Precautions: Fall Precaution Comments: R hemiparesis Restrictions Weight Bearing Restrictions: No   Pain:  Session 1: No reports of pain throughout session.  Session 2: No reports of pain throughout session.    Therapy/Group: Individual Therapy  Ginny Forth , PT, DPT, NCS, CSRS 03/08/2021, 7:56 AM

## 2021-03-09 LAB — GLUCOSE, CAPILLARY
Glucose-Capillary: 106 mg/dL — ABNORMAL HIGH (ref 70–99)
Glucose-Capillary: 78 mg/dL (ref 70–99)
Glucose-Capillary: 100 mg/dL — ABNORMAL HIGH (ref 70–99)
Glucose-Capillary: 134 mg/dL — ABNORMAL HIGH (ref 70–99)

## 2021-03-09 NOTE — Progress Notes (Signed)
Occupational Therapy Session Note  Patient Details  Name: Carrie Kline MRN: [id number] Date of Birth: June 25, 1946  Today's Date: 03/09/2021 OT Individual Time: 1102-1117 OT Individual Time Calculation (min): 26 min    Short Term Goals: Week 2:  OT Short Term Goal 1 (Week 2): Pt will complete UB bathing with supervision following hemi technique. OT Short Term Goal 2 (Week 2): Pt will complete UB dressing with supervision for two consecutive sessions. OT Short Term Goal 3 (Week 2): Pt will complete LB dressing with min assist sit to stand. OT Short Term Goal 4 (Week 2): Pt will complete toilet transfers with min assist stand pivot. OT Short Term Goal 5 (Week 2): Pt will use the RUE as at gross assist with min assist with selfcare tasks.  Skilled Therapeutic Interventions/Progress Updates:    Pt received seated in recliner, denies pain, agreeable to therapy. Session focus on activity tolerance, RUE NMR, mental practice in prep for improved ADL/IADL/func mobility performance + decreased caregiver burden. Total A w/c transport to and from gym 2/2 time management and energy conservation. Pt completed 1x10 of R shoulder ER/IR + flexion with use of BZ board and min A from therapist to facilitate full elbow extension. With level 1 resistive sponge, completed 10 grasps and releases with total A from therapist and additionally practiced "flattening" out sponge against table. Reviewed benefits of mental practice for functional motor return, and pulled up [facility] mental practice audio guides for pt to listen to in between therapy.   Pt left seated in w/c with safety belt alarm engaged, call bell in reach, and all immediate needs met.    Therapy Documentation Precautions:  Precautions Precautions: Fall Precaution Comments: R hemiparesis Restrictions Weight Bearing Restrictions: No  Pain: denies   ADL: See Care Tool for more details.  Therapy/Group: Individual Therapy  Volanda Napoleon MS,  OTR/L  03/09/2021, 6:54 AM

## 2021-03-09 NOTE — Progress Notes (Signed)
Physical Therapy Weekly Progress Note  Patient Details  Name: Carrie Kline MRN: [id number] Date of Birth: 1946-02-24  Beginning of progress report period: March 02, 2021 End of progress report period: March 09, 2021  Today's Date: 03/09/2021 PT Individual Time: 1123-1202 PT Individual Time Calculation (min): 39 min   Patient has met 2 of 4 short term goals.  [name] is progressing well with therapy demonstrating increasing independence with functional mobility. She is performing supine<>sit with min/mod assist, sit<>stands with min assist, squat pivot transfers min assist, stand pivot transfers no AD with mod assist, and ambulating up to 9f no AD with mod assist of 1. While wearing DF assist ACE wrap for gait training, she is able to advance R LE during swing with min assist for increased step length and stabilize R knee during stance with guarding for knee stability and facilitation for increased hip extension and anterior pelvic translation to promote reciprocal stepping pattern. [name] demonstrating improving R LE muscle activation and improving standing balance. She will benefit from continued CIR level therapies to further progress mobility level prior to D/C home with 24hr support from family.   Patient continues to demonstrate the following deficits muscle weakness, muscle joint tightness, and muscle paralysis, decreased cardiorespiratoy endurance, impaired timing and sequencing, abnormal tone, and unbalanced muscle activation, and decreased sitting balance, decreased standing balance, decreased postural control, hemiplegia, and decreased balance strategies and therefore will continue to benefit from skilled PT intervention to increase functional independence with mobility.  Patient progressing toward long term goals..  Continue plan of care.  PT Short Term Goals Week 1:  PT Short Term Goal 1 (Week 1): Pt will complete bed mobility with MinA PT Short Term Goal 1 - Progress (Week 1):  Progressing toward goal PT Short Term Goal 2 (Week 1): Pt will ambulate at least 318fwith ModA PT Short Term Goal 2 - Progress (Week 1): Met PT Short Term Goal 3 (Week 1): Pt will transfer bed to chair with MinA using squat pivot consistently PT Short Term Goal 3 - Progress (Week 1): Met PT Short Term Goal 4 (Week 1): Pt will got up 4 steps using handrails and ModA PT Short Term Goal 4 - Progress (Week 1): Progressing toward goal Week 2:  PT Short Term Goal 1 (Week 2): Pt will perform supine<>sit with min assist PT Short Term Goal 2 (Week 2): Pt will perform sit<>stands using LRAD with CGA PT Short Term Goal 3 (Week 2): Pt will perform bed<>chair transfers via stand pivot using LRAD with min assist PT Short Term Goal 4 (Week 2): Pt will ambulate at least 5031fsing LRAD with min assist of 1 PT Short Term Goal 5 (Week 2): Pt will initiate stair navigation training  Skilled Therapeutic Interventions/Progress Updates:  Ambulation/gait training;Discharge planning;Functional mobility training;Therapeutic Activities;Visual/perceptual remediation/compensation;Psychosocial support;Balance/vestibular training;Disease management/prevention;Neuromuscular re-education;Skin care/wound management;Therapeutic Exercise;Wheelchair propulsion/positioning;Cognitive remediation/compensation;DME/adaptive equipment instruction;Pain management;Splinting/orthotics;UE/LE Strength taining/ROM;Community reintegration;Functional electrical stimulation;Patient/family education;Stair training;UE/LE Coordination activities   Pt received sitting in w/c and agreeable to therapy session. Pt reports back discomfort sitting in her current wheelchair.  Transported to/from gym in w/c for time management and energy conservation.Therapist swapped out her current wheelchair for a different 20x18in w/c with a contoured back support and thicker contoured seat cushion - pt reports significantly increased comfort and demonstrates improved  pelvic and spinal alignment with improved upright posture resulting in improved R UE alignment on 1/2 lap tray. Sit>stands w/c>no UE support with min assist progressing to CGA during session -  pt demos recall to scoot forward prior to initiating coming to stand without cuing. Donned R LE DF assist ACE wrap (new technique). Gait training 16f 2x next to L hallway rail for safety but no UE support with mod assist of 1 for balance and R LE management - pt demos ability to advance R LE during swing requiring min facilitation for increased step length and improved heel strike - continues to demo short R stance phase with decreased hip extension and anterior pelvic translation  resulting in shorter L step length, cuing and facilitation throughout for improvement - blocking R knee during stance for buckling safety. Pt able to safely turn and sit in a chair at the end of gait without w/c follow. Squat pivot transfers during session with min assist and therapist facilitating increased R LE WBing for NMR. At end of session pt left sitting in w/c with needs in reach, seat belt alarm on, and R UE supported on 1/2 lap tray.   Therapy Documentation Precautions:  Precautions Precautions: Fall Precaution Comments: R hemiparesis Restrictions Weight Bearing Restrictions: No   Pain:  No reports of pain throughout session but does report back discomfort from current wheelchair - therapist swapped out w/c.    Therapy/Group: Individual Therapy  CTawana Scale, PT, DPT, NCS, CSRS 03/09/2021, 8:00 AM

## 2021-03-09 NOTE — Progress Notes (Signed)
PROGRESS NOTE   Subjective/Complaints:  Seen in PT , states she is trying to drink more H2O , no pains, states urinating freq  ROS: Patient denies CP, SOB, N/V/D Objective:   No results found. No results for input(s): WBC, HGB, HCT, PLT in the last 72 hours.  Recent Labs    03/08/21 0655  NA 134*  K 5.0  CL 106  CO2 21*  GLUCOSE 115*  BUN 22  CREATININE 1.02*  CALCIUM 9.5     Intake/Output Summary (Last 24 hours) at 03/09/2021 0816 Last data filed at 03/09/2021 0700 Gross per 24 hour  Intake 1300 ml  Output --  Net 1300 ml         Physical Exam: Vital Signs Blood pressure 120/74, pulse 78, temperature 98 F (36.7 C), temperature source Oral, resp. rate 17, height 5\' 3"  (1.6 m), weight 90 kg, SpO2 100 %.  General: No acute distress Mood and affect are appropriate Heart: Regular rate and rhythm no rubs murmurs or extra sounds Lungs: Clear to auscultation, breathing unlabored, no rales or wheezes Abdomen: Positive bowel sounds, soft nontender to palpation, nondistended Extremities: No clubbing, cyanosis, or edema Skin: No evidence of breakdown, no evidence of rash Neurologic: Cranial nerves II through XII intact, motor strength is 5/5 in left deltoid, bicep, tricep, grip, hip flexor, knee extensors, ankle dorsiflexor and plantar flexor 3-/5 RUE bi, tri, 0/5 finger and wrist , 2- R thumb flexion and R hip /knee ext synergy, 0/5 distal RLE--stable exam Sensory exam normal sensation to light touch and proprioception in bilateral upper and lower extremities Neg mystagmus  Musculoskeletal: normal ROM, no pain    Assessment/Plan: 1. Functional deficits which require 3+ hours per day of interdisciplinary therapy in a comprehensive inpatient rehab setting. Physiatrist is providing close team supervision and 24 hour management of active medical problems listed below. Physiatrist and rehab team continue to assess  barriers to discharge/monitor patient progress toward functional and medical goals  Care Tool:  Bathing    Body parts bathed by patient: Right arm, Chest, Abdomen, Right upper leg, Left upper leg, Right lower leg, Face, Buttocks, Front perineal area, Left lower leg   Body parts bathed by helper: Left arm, Right lower leg     Bathing assist Assist Level: Minimal Assistance - Patient > 75%     Upper Body Dressing/Undressing Upper body dressing   What is the patient wearing?: Pull over shirt    Upper body assist Assist Level: Minimal Assistance - Patient > 75%    Lower Body Dressing/Undressing Lower body dressing      What is the patient wearing?: Underwear/pull up, Pants     Lower body assist Assist for lower body dressing: Moderate Assistance - Patient 50 - 74%     Toileting Toileting    Toileting assist Assist for toileting: Moderate Assistance - Patient 50 - 74%     Transfers Chair/bed transfer  Transfers assist     Chair/bed transfer assist level: Minimal Assistance - Patient > 75% (squat pivot)     Locomotion Ambulation   Ambulation assist      Assist level: Moderate Assistance - Patient 50 - 74% Assistive device:  No Device Max distance: 7ft   Walk 10 feet activity   Assist     Assist level: Moderate Assistance - Patient - 50 - 74% Assistive device: No Device   Walk 50 feet activity   Assist Walk 50 feet with 2 turns activity did not occur: Safety/medical concerns  Assist level: Moderate Assistance - Patient - 50 - 74% Assistive device: No Device    Walk 150 feet activity   Assist Walk 150 feet activity did not occur: Safety/medical concerns         Walk 10 feet on uneven surface  activity   Assist Walk 10 feet on uneven surfaces activity did not occur: Safety/medical concerns         Wheelchair     Assist Will patient use wheelchair at discharge?:  (TBD)   Wheelchair activity did not occur: N/A          Wheelchair 50 feet with 2 turns activity    Assist    Wheelchair 50 feet with 2 turns activity did not occur: N/A       Wheelchair 150 feet activity     Assist  Wheelchair 150 feet activity did not occur: N/A       Blood pressure 120/74, pulse 78, temperature 98 F (36.7 C), temperature source Oral, resp. rate 17, height 5\' 3"  (1.6 m), weight 90 kg, SpO2 100 %.  Medical Problem List and Plan: 1.  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  Antithrombotics: -DVT/anticoagulation: Lovenox             -antiplatelet therapy: Aspirin 81 mg daily and Plavix 75 mg daily x3 weeks and aspirin alone 3. Pain Management: N/A 4. Mood: Provide emotional support             -antipsychotic agents: N/A 5. Neuropsych: This patient is capable of making decisions on her own behalf. 6. Skin/Wound Care: Routine skin checks 7. Fluids/Electrolytes/Nutrition: Routine in and outs with follow-up chemistries 8.  New findings diabetes mellitus.  Hemoglobin A1c 7.4.  d/c glucophage given side effects. Already received today, start glipizide 2.5mg  tomorrow. Discussed minimizing added sugar and bread/pasta/rice- she is already doing. Diabetic teaching. CBGs improved. Provided list of foods good for diabetes.  CBG (last 3)  Recent Labs    03/08/21 1612 03/08/21 2110 03/09/21 0607  GLUCAP 95 97 106*   Controlled off SSI, on glipizide  9.  Hypertension.  Well controlled. Decrease lisinopril to 2.5mg  given hypotension and AKI.  Consider decreasing if creatinine continues to rise.  Vitals:   03/08/21 1924 03/09/21 0437  BP: (!) 126/59 120/74  Pulse: 68 78  Resp: 17 17  Temp: 97.7 F (36.5 C) 98 F (36.7 C)  SpO2: 100% 100%   Controlled 7/14, now on low dose lisinopril 10.  Hyperlipidemia.  Lipitor 11.  Thyroid nodule.  Follow-up outpatient 12.  AKI: encouraged 6-8 glasses of water per day. Stop metformin. Decrease lisinopril to 2.5mg . Monitor creatinine.   D/C IVF recheck BMET in am    LOS: 8 days A FACE TO FACE EVALUATION WAS PERFORMED  8/14 03/09/2021, 8:16 AM

## 2021-03-09 NOTE — Progress Notes (Addendum)
Physical Therapy Session Note  Patient Details  Name: Carrie Kline MRN: [id number] Date of Birth: 1946/07/12  Today's Date: 03/09/2021 PT Individual Time: 2426-8341; 9622-2979 PT Individual Time Calculation (min): 26 min; 44 minutes  Short Term Goals: Week 1:  PT Short Term Goal 1 (Week 1): Pt will complete bed mobility with MinA PT Short Term Goal 2 (Week 1): Pt will ambulate at least 73ft with ModA PT Short Term Goal 3 (Week 1): Pt will transfer bed to chair with MinA using squat pivot consistently PT Short Term Goal 4 (Week 1): Pt will got up 4 steps using handrails and ModA   Skilled Therapeutic Interventions/Progress Updates:    Session One:  Pt received in stedy to the wheelchair with nursing post bathroom.  Pt is agreeable to therapy and states no pain or nausea at this time. Gait belt placed prior to initiation of mobility. Pt wheeled to 4w gym for time management.   Exercises performed in // bars to allow for UE support and grip/Wbing through RUE. Mini squats x20 MinA/ intermittent ModA secondary to increased weight shift towards the RLE. Verbal/Tactile cuing for equal weight shifting through BLE, increased quad activation on RLE, and focus on eccentric lowering. TKE x 10 RLE with MinA to prevent compensation at the hips. Tactile and verbal cuing to increase right quad recruitment. Therapist did not need to block R knee for mini squat or TKE. Lateral stepping 2 x 24ft ModA. Noted increase lateral lean to right when stepping with LLE, forward flexed trunk, and decreased step length LLE. This slightly improved with multimodal cuing and with therapist providing right knee block with RLE stance phase.   Pt sitting in wheelchair with seatbelt alarm on. Call bell within reach. No further needs expressed at this time.  Session Two: Pt received sitting in wheelchair and agreeable to therapy. Pt requests restroom before going to the gym. Gait belt placed prior to initiation of  mobility.  Stand pivot transfers wc <> commode with MinA for balance and set up assist. Pt verbalizes sequencing/posture during transfers. CGA during standing with pt completing peri care and clothing management. MinA assist to donn pants on right side.   All STS throughout session at [facility].   LiteGait over treadmill utilized this session. Pt maintained standing balance while donning/doffing body weight support harshness up to MinA secondary to fatigue. Pt completed gait training BUE 1x2'30" and 1x 2'50" at a speed of 0.6. Manual facilitation of RLE/R knee block, weight shifting, and increased stance time on RLE to facilitate hip extension. Verbal cuing for increased step length on LLE. Verbal and manual facilitation of RLE provided with backwards stepping off treadmill. Right knee blocked with step down.   Pt sitting in wheelchair with seatbelt alarm on at end of session. Call bell within reach. No further needs expressed at this time.  Therapy Documentation Precautions:  Precautions Precautions: Fall Precaution Comments: R hemiparesis Restrictions Weight Bearing Restrictions: No  Pain: Pain Assessment Pain Scale: 0-10 Pain Score: 0-No pain   Therapy/Group: Individual Therapy  Kennethia Lynes, SPT 03/09/2021, 11:21 AM

## 2021-03-09 NOTE — Progress Notes (Signed)
Occupational Therapy Session Note  Patient Details  Name: Carrie Kline MRN: [id number] Date of Birth: 11/11/1945  Today's Date: 03/09/2021 OT Individual Time: 0904-1000 OT Individual Time Calculation (min): 56 min    Short Term Goals: Week 2:  OT Short Term Goal 1 (Week 2): Pt will complete UB bathing with supervision following hemi technique. OT Short Term Goal 2 (Week 2): Pt will complete UB dressing with supervision for two consecutive sessions. OT Short Term Goal 3 (Week 2): Pt will complete LB dressing with min assist sit to stand. OT Short Term Goal 4 (Week 2): Pt will complete toilet transfers with min assist stand pivot. OT Short Term Goal 5 (Week 2): Pt will use the RUE as at gross assist with min assist with selfcare tasks.  Skilled Therapeutic Interventions/Progress Updates:    Pt completed shower and dressing during session.  Mod assist for toilet transfer stand pivot with use of the grab bar for support.  Min assist to clothing management and toilet hygiene to remove clothing.  She did not re-donn it secondary to transferring over to the shower after completion of toileting tasks.  She was then able to complete transfer to the shower with mod assist using the grab bars on the left.  She completed all bathing in sitting with overall min assist, needing assist with washing the LUE using the RUE and for crossing the RLE in order to wash the lower leg and foot.  Mod assist for transfer out of the shower with mod assist for LB dressing following hemi techniques.  Max assist for sports bra with min assist for pullover shirt.  She was able to donn her socks one handed and then donned her velcro shoes at min assist level.  Oral hygiene completed in sitting with setup.  Call button and phone in reach at end of session with pt resting in the wheelchair.    Therapy Documentation Precautions:  Precautions Precautions: Fall Precaution Comments: R hemiparesis Restrictions Weight Bearing  Restrictions: No  Pain: Pain Assessment Pain Scale: 0-10 Pain Score: 0-No pain ADL: See Care Tool Section for some details of mobility and selfcare  Therapy/Group: Individual Therapy  Prentis Langdon OTR/L 03/09/2021, 12:10 PM

## 2021-03-10 LAB — BASIC METABOLIC PANEL
Anion gap: 8 (ref 5–15)
BUN: 26 mg/dL — ABNORMAL HIGH (ref 8–23)
CO2: 23 mmol/L (ref 22–32)
Calcium: 9.9 mg/dL (ref 8.9–10.3)
Chloride: 104 mmol/L (ref 98–111)
Creatinine, Ser: 1.16 mg/dL — ABNORMAL HIGH (ref 0.44–1.00)
GFR, Estimated: 50 mL/min — ABNORMAL LOW (ref >60)
Glucose, Bld: 117 mg/dL — ABNORMAL HIGH (ref 70–99)
Potassium: 4.4 mmol/L (ref 3.5–5.1)
Sodium: 135 mmol/L (ref 135–145)

## 2021-03-10 LAB — GLUCOSE, CAPILLARY
Glucose-Capillary: 128 mg/dL — ABNORMAL HIGH (ref 70–99)
Glucose-Capillary: 97 mg/dL (ref 70–99)
Glucose-Capillary: 106 mg/dL — ABNORMAL HIGH (ref 70–99)
Glucose-Capillary: 100 mg/dL — ABNORMAL HIGH (ref 70–99)

## 2021-03-10 NOTE — Progress Notes (Signed)
PROGRESS NOTE   Subjective/Complaints:  Reviewed repeat be met ROS: Patient denies CP, SOB, N/V/D Objective:   No results found. No results for input(s): WBC, HGB, HCT, PLT in the last 72 hours.  Recent Labs    03/08/21 0655 03/10/21 0525  NA 134* 135  K 5.0 4.4  CL 106 104  CO2 21* 23  GLUCOSE 115* 117*  BUN 22 26*  CREATININE 1.02* 1.16*  CALCIUM 9.5 9.9     Intake/Output Summary (Last 24 hours) at 03/10/2021 1357 Last data filed at 03/10/2021 1245 Gross per 24 hour  Intake 1497 ml  Output 350 ml  Net 1147 ml         Physical Exam: Vital Signs Blood pressure 103/67, pulse 80, temperature 98.3 F (36.8 C), temperature source Oral, resp. rate 18, height _0  (1.6 m), weight 90 kg, SpO2 100 %.   General: No acute distress Mood and affect are appropriate Heart: Regular rate and rhythm no rubs murmurs or extra sounds Lungs: Clear to auscultation, breathing unlabored, no rales or wheezes Abdomen: Positive bowel sounds, soft nontender to palpation, nondistended Extremities: No clubbing, cyanosis, or edema Skin: No evidence of breakdown, no evidence of rash   Neurologic: Cranial nerves II through XII intact, motor strength is 5/5 in left deltoid, bicep, tricep, grip, hip flexor, knee extensors, ankle dorsiflexor and plantar flexor 3-/5 RUE bi, tri, 1/5 finger and wrist , 2- R thumb flexion and R hip /knee ext synergy, 0/5 distal RLE--stable exam Sensory exam normal sensation to light touch and proprioception in bilateral upper and lower extremities Neg mystagmus  Musculoskeletal: normal ROM, no pain    Assessment/Plan: 1. Functional deficits which require 3+ hours per day of interdisciplinary therapy in a comprehensive inpatient rehab setting. Physiatrist is providing close team supervision and 24 hour management of active medical problems listed below. Physiatrist and rehab team continue to assess  barriers to discharge/monitor patient progress toward functional and medical goals  Care Tool:  Bathing    Body parts bathed by patient: Right arm, Chest, Abdomen, Right upper leg, Left upper leg, Right lower leg, Face, Buttocks, Front perineal area, Left lower leg   Body parts bathed by helper: Left arm     Bathing assist Assist Level: Minimal Assistance - Patient > 75%     Upper Body Dressing/Undressing Upper body dressing   What is the patient wearing?: Pull over shirt    Upper body assist Assist Level: Minimal Assistance - Patient > 75%    Lower Body Dressing/Undressing Lower body dressing      What is the patient wearing?: Underwear/pull up, Pants     Lower body assist Assist for lower body dressing: Moderate Assistance - Patient 50 - 74%     Toileting Toileting    Toileting assist Assist for toileting: Minimal Assistance - Patient > 75%     Transfers Chair/bed transfer  Transfers assist     Chair/bed transfer assist level: Minimal Assistance - Patient > 75% (squat pivot)     Locomotion Ambulation   Ambulation assist      Assist level: Moderate Assistance - Patient 50 - 74% Assistive device: No Device Max distance: 47f  Walk 10 feet activity   Assist     Assist level: Moderate Assistance - Patient - 50 - 74% Assistive device: No Device   Walk 50 feet activity   Assist Walk 50 feet with 2 turns activity did not occur: Safety/medical concerns  Assist level: Moderate Assistance - Patient - 50 - 74% Assistive device: No Device    Walk 150 feet activity   Assist Walk 150 feet activity did not occur: Safety/medical concerns         Walk 10 feet on uneven surface  activity   Assist Walk 10 feet on uneven surfaces activity did not occur: Safety/medical concerns         Wheelchair     Assist Will patient use wheelchair at discharge?:  (TBD)   Wheelchair activity did not occur: N/A         Wheelchair 50 feet with  2 turns activity    Assist    Wheelchair 50 feet with 2 turns activity did not occur: N/A       Wheelchair 150 feet activity     Assist  Wheelchair 150 feet activity did not occur: N/A       Blood pressure 103/67, pulse 80, temperature 98.3 F (36.8 C), temperature source Oral, resp. rate 18, height _0  (1.6 m), weight 90 kg, SpO2 100 %.  Medical Problem List and Plan: 1.  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  Antithrombotics: -DVT/anticoagulation: Lovenox             -antiplatelet therapy: Aspirin 81 mg daily and Plavix 75 mg daily x3 weeks and aspirin alone 3. Pain Management: N/A 4. Mood: Provide emotional support             -antipsychotic agents: N/A 5. Neuropsych: This patient is capable of making decisions on her own behalf. 6. Skin/Wound Care: Routine skin checks 7. Fluids/Electrolytes/Nutrition: Routine in and outs with follow-up chemistries 8.  New findings diabetes mellitus.  Hemoglobin A1c 7.4.  d/c glucophage given side effects. Already received today, start glipizide 2.33m tomorrow. Discussed minimizing added sugar and bread/pasta/rice- she is already doing. Diabetic teaching. CBGs improved. Provided list of foods good for diabetes.  CBG (last 3)  Recent Labs    03/09/21 2101 03/10/21 0553 03/10/21 1150  GLUCAP 134* 128* 97   Controlled off SSI, on glipizide  9.  Hypertension.  Well controlled. Decrease lisinopril to 2.551mgiven hypotension and AKI.  Consider decreasing if creatinine continues to rise.  Vitals:   03/10/21 0450 03/10/21 1326  BP: 114/60 103/67  Pulse: 79 80  Resp: 20 18  Temp: 98.1 F (36.7 C) 98.3 F (36.8 C)  SpO2: 100% 100%   Controlled 7/14, now on low dose lisinopril 10.  Hyperlipidemia.  Lipitor 11.  Thyroid nodule.  Follow-up outpatient 12. AKI: encouraged 6-8 glasses  of water per day. Stop metformin. Decrease lisinopril to 2.14m19mMonitor creatinine.   D/C IVF recheck BMET in am, continue to monitor, p.o. fluid intake 1997 mL on 7/14    LOS: 9 days A FACE TO FACEdwardsKirsteins 03/10/2021, 1:57 PM

## 2021-03-10 NOTE — Progress Notes (Signed)
Occupational Therapy Session Note  Patient Details  Name: Carrie Kline MRN: [id number] Date of Birth: 02-Apr-1946  Today's Date: 03/10/2021 OT Individual Time: 7989-2119 OT Individual Time Calculation (min): 73 min    Short Term Goals: Week 2:  OT Short Term Goal 1 (Week 2): Pt will complete UB bathing with supervision following hemi technique. OT Short Term Goal 2 (Week 2): Pt will complete UB dressing with supervision for two consecutive sessions. OT Short Term Goal 3 (Week 2): Pt will complete LB dressing with min assist sit to stand. OT Short Term Goal 4 (Week 2): Pt will complete toilet transfers with min assist stand pivot. OT Short Term Goal 5 (Week 2): Pt will use the RUE as at gross assist with min assist with selfcare tasks.  Skilled Therapeutic Interventions/Progress Updates:    Session 1: (0801- 0914) Pt in bed to start session with transfer to sitting with mod assist.  She then ambulated to the bathroom at max assist level with no assistive device.  Increased right and posterior lean with increased right knee buckling.  She completed all aspects of toileting with min assist sit to stand and ambulated over to the shower at mod assist with use of the grab bar on the left side.  She was able to complete all bathing with min assist sit to stand.  Mod assist needed for integration of the RUE for washing the left arm, but she is able to use it as a stabilizer for holding her washcloth when applying soap at supervision level.  Once shower was complete, she performed stand pivot transfer to the wheelchair at mod assist for work on grooming and dressing tasks at the sink.   She was able to complete oral hygiene with setup from the wheelchair with UB dressing being completed at min assist for donning a pullover shirt.  LB dressing was completed at [facility] assist with therapist facilitating crossing the RLE over the left knee and maintaining it while she threaded items.  She was able to donn her socks  and shoes at min assist level as well.  Next had pt work on standing balance and weightshifts at the sink with the RUE in weightbearing.  Progressed to letting go with the LUE, supporting herself with the right and then completing small steps forward and backwards with the LLE.  Decreased hip control noted on the left with increased trunk flexion to compensate.  Pt with one episode of nausea toward the end of the session but she only spit up slightly and then felt better.  Finished session with call button and phone in reach and safety belt in place.    Session 2: (4174-0814)  Pt completed transfer from the recliner to the wheelchair with mod assist to begin session.  She was then taken down to the therapy gym for transfer to the therapy mat at the same level.  Had her begin with transition into supine with mod assist for work on RUE functional movement.  Dowel rod was used with ace bandage in place to help hold the right hand grip.  Worked on simple movements of shoulder flexion and chest press, place and hold for 20-30 seconds.  Mod assist needed on the right side to maintain position with shoulder flexion to 90 degrees and for full elbow extension.  Transitioned back to sitting after several repetitions with continued focus on digit flexion and extension while incorporating shoulder flexion on a slanted board.  Clothespins were positioned on the board at  the end and pt was asked to push her right hand up the board on top of a washcloth in order to knock the clothespins off into a container.  She needed mod facilitation to avoid trunk compensation.  NMES was placed on the digit extensors for 10 mins on level 5 while completing this activity.  Switched stimulation to the flexors as well, while hand was resting in her lap for 8 mins and intensity on level 8.  No pain or adverse reactions noted to stimulation.  Finished session with transfer back to the wheelchair with mod assist stand pivot and then back to the  room.  She then transferred back to the recliner with mod assist to rest.  Chair alarm in place with call button and phone in reach.    Therapy Documentation Precautions:  Precautions Precautions: Fall Precaution Comments: R hemiparesis Restrictions Weight Bearing Restrictions: No  Pain: Pain Assessment Pain Scale: 0-10 Pain Score: 0-No pain Faces Pain Scale: No hurt ADL: See Care Tool Section for some details of mobility and selfcare   Therapy/Group: Individual Therapy  Jordann Grime OTR/L 03/10/2021, 9:16 AM

## 2021-03-10 NOTE — Progress Notes (Signed)
Physical Therapy Session Note  Patient Details  Name: Carrie Kline MRN: [id number] Date of Birth: Jan 10, 1946  Today's Date: 03/10/2021 PT Individual Time: 2330-0762 PT Individual Time Calculation (min): 71 min   Short Term Goals: Week 1:  PT Short Term Goal 1 (Week 1): Pt will complete bed mobility with MinA PT Short Term Goal 1 - Progress (Week 1): Progressing toward goal PT Short Term Goal 2 (Week 1): Pt will ambulate at least 67f with ModA PT Short Term Goal 2 - Progress (Week 1): Met PT Short Term Goal 3 (Week 1): Pt will transfer bed to chair with MinA using squat pivot consistently PT Short Term Goal 3 - Progress (Week 1): Met PT Short Term Goal 4 (Week 1): Pt will got up 4 steps using handrails and ModA PT Short Term Goal 4 - Progress (Week 1): Progressing toward goal Week 2:  PT Short Term Goal 1 (Week 2): Pt will perform supine<>sit with min assist PT Short Term Goal 2 (Week 2): Pt will perform sit<>stands using LRAD with CGA PT Short Term Goal 3 (Week 2): Pt will perform bed<>chair transfers via stand pivot using LRAD with min assist PT Short Term Goal 4 (Week 2): Pt will ambulate at least 513fusing LRAD with min assist of 1 PT Short Term Goal 5 (Week 2): Pt will initiate stair navigation training  Skilled Therapeutic Interventions/Progress Updates:  Patient seated upright in w/c on entrance to room. Patient alert and agreeable to PT session but requests to toilet prior to leaving room. Patient denied pain during session.  Therapeutic Activity: Transfers: Toilet transfer performed with CGA using safety rail in bathroom. Clothing mgmt performed with CGA for balance. Pericare with supervision.  Patient performed STS and SPVT transfers throughout session with Min A/ CGA with light block to R knee. Provided verbal cues for technique to push from w/c armrests to stand. Good step progression noted with RLE with only vc required for maintaining foot clearance.  Neuromuscular  Re-ed: NMR facilitated during session with focus on motor control and muscle contraction facilitation. PT guided into lateral weight shifting to initiate RLE extension. Pt guided in standing R knee extensions against Level 1 tband resistance with focus on glute max activation. Performed 2x10 with seated rest break between bouts. Muscle activation noted in glute max. Progressed to functional RLE bkwd stepping with vc for full RLE extension and continued R glute max activation noted. NMR performed for improvements in motor control and coordination, balance, sequencing, judgement, and self confidence/ efficacy in performing all aspects of mobility at highest level of independence.   Gait Training:  RLE wrapped with short stretch wrap for DF assist. Patient ambulated 25' x2 using no AD and Min A with focus on R hip and knee extension during stance phase. VC throughout for glute activation during completion of stance phase and bil reciprocal forward hip translation with initiating swing phase. Pt emotional during rest break between bouts and stating that she cannot believe she can walk.   Pt provided with RW and demos ability to ambulate 45' x1 with overall CGA. Continued vc/ tc for RLE hip/ knee extension during stance phase. No knee buckling noted throughout amb bouts.   Patient seated upright  in w/c at end of session with brakes locked, belt alarm set, and all needs within reach.     Therapy Documentation Precautions:  Precautions Precautions: Fall Precaution Comments: R hemiparesis Restrictions Weight Bearing Restrictions: No  Therapy/Group: Individual Therapy  JuAlger SimonsT,  DPT 03/10/2021, 6:28 PM

## 2021-03-11 LAB — GLUCOSE, CAPILLARY
Glucose-Capillary: 133 mg/dL — ABNORMAL HIGH (ref 70–99)
Glucose-Capillary: 97 mg/dL (ref 70–99)
Glucose-Capillary: 95 mg/dL (ref 70–99)
Glucose-Capillary: 109 mg/dL — ABNORMAL HIGH (ref 70–99)

## 2021-03-11 NOTE — Progress Notes (Signed)
Physical Therapy Session Note  Patient Details  Name: Carrie Kline MRN: [id number] Date of Birth: 12-27-1945  Today's Date: 03/11/2021 PT Individual Time: 0907-1004 PT Individual Time Calculation (min): 57 min   Short Term Goals: Week 2:  PT Short Term Goal 1 (Week 2): Pt will perform supine<>sit with min assist PT Short Term Goal 2 (Week 2): Pt will perform sit<>stands using LRAD with CGA PT Short Term Goal 3 (Week 2): Pt will perform bed<>chair transfers via stand pivot using LRAD with min assist PT Short Term Goal 4 (Week 2): Pt will ambulate at least 85ft using LRAD with min assist of 1 PT Short Term Goal 5 (Week 2): Pt will initiate stair navigation training  Skilled Therapeutic Interventions/Progress Updates:    Pt received sitting in w/c and agreeable to therapy session.  Transported to/from gym in w/c for time management and energy conservation. Donned R LE ankle DF assist ACE wrap. Stepped on/off treadmill using L UE support on bar with min assist for balance - cuing for sequencing steps. Donned litegait harness. Performed the following locomotor treadimll training trials:  1st: 17min06sec at 0.5 increased quickly to 0.19mph totaling 233feet, using L UE support - therapist facilitating increased R LE step length 2nd: at 0.70mph totaling 298ft, not using L UE support - cuing for increased R LE hip/knee extension during stance phase and decreased R LE ER during swing phase, requires facilitation to improve these impairments 3rd: 83min10sec at 0.8-0. totaling 254ft, not using L UE support with continued cuing/facilitation as just described with pt demonstrating improvement.  Stepped off treadmill and transported back towards room. Gait training ~43ft overground without UE support, using R LE DF ACE wrap still, with light mod assist of 1 - pt able to advance R LE during swing without facilitation and stabilize R knee during stance without blocking - continued cuing for increased R  hip extension and anterior translation of pelvis over R stance limb. Pt left sitting in recliner with needs in reach, B LEs elevated, and chair alarm on.   Therapy Documentation Precautions:  Precautions Precautions: Fall Precaution Comments: R hemiparesis Restrictions Weight Bearing Restrictions: No   Pain: Denies pain during session.    Therapy/Group: Individual Therapy  Ginny Forth , PT, DPT, NCS, CSRS 03/11/2021, 7:50 AM

## 2021-03-12 LAB — GLUCOSE, CAPILLARY
Glucose-Capillary: 132 mg/dL — ABNORMAL HIGH (ref 70–99)
Glucose-Capillary: 94 mg/dL (ref 70–99)
Glucose-Capillary: 115 mg/dL — ABNORMAL HIGH (ref 70–99)
Glucose-Capillary: 127 mg/dL — ABNORMAL HIGH (ref 70–99)

## 2021-03-12 NOTE — Progress Notes (Signed)
PROGRESS NOTE   Subjective/Complaints:   Pt reports feeling pretty good- denies pain.  Getting some movement back- LBM yesterday- going daily.   Asked to get IV out.    ROS:  Pt denies SOB, abd pain, CP, N/V/C/D, and vision changes  Objective:   No results found. No results for input(s): WBC, HGB, HCT, PLT in the last 72 hours.  Recent Labs    03/10/21 0525  NA 135  K 4.4  CL 104  CO2 23  GLUCOSE 117*  BUN 26*  CREATININE 1.16*  CALCIUM 9.9    Intake/Output Summary (Last 24 hours) at 03/12/2021 1059 Last data filed at 03/12/2021 0742 Gross per 24 hour  Intake 400 ml  Output --  Net 400 ml        Physical Exam: Vital Signs Blood pressure 113/67, pulse 76, temperature 97.7 F (36.5 C), temperature source Oral, resp. rate 20, height 5\' 3"  (1.6 m), weight 90 kg, SpO2 99 %.     General: awake, alert, appropriate, sitting up in bed watching TV; NAD HENT: conjugate gaze; oropharynx moist CV: regular rate; no JVD Pulmonary: CTA B/L; no W/R/R- good air movement GI: soft, NT, ND, (+)BS Psychiatric: appropriate; interactive Neurological: Ox3   Neurologic: Cranial nerves II through XII intact, motor strength is 5/5 in left deltoid, bicep, tricep, grip, hip flexor, knee extensors, ankle dorsiflexor and plantar flexor 3-/5 RUE bi, tri, 1/5 finger and wrist , 2- R thumb flexion and R hip /knee ext synergy, 0/5 distal RLE--stable exam Sensory exam normal sensation to light touch and proprioception in bilateral upper and lower extremities Neg mystagmus  Musculoskeletal: normal ROM, no pain    Assessment/Plan: 1. Functional deficits which require 3+ hours per day of interdisciplinary therapy in a comprehensive inpatient rehab setting. Physiatrist is providing close team supervision and 24 hour management of active medical problems listed below. Physiatrist and rehab team continue to assess barriers to  discharge/monitor patient progress toward functional and medical goals  Care Tool:  Bathing    Body parts bathed by patient: Right arm, Chest, Abdomen, Right upper leg, Left upper leg, Right lower leg, Face, Buttocks, Front perineal area, Left lower leg   Body parts bathed by helper: Left arm     Bathing assist Assist Level: Minimal Assistance - Patient > 75%     Upper Body Dressing/Undressing Upper body dressing   What is the patient wearing?: Pull over shirt    Upper body assist Assist Level: Minimal Assistance - Patient > 75%    Lower Body Dressing/Undressing Lower body dressing      What is the patient wearing?: Underwear/pull up, Pants     Lower body assist Assist for lower body dressing: Moderate Assistance - Patient 50 - 74%     Toileting Toileting    Toileting assist Assist for toileting: Minimal Assistance - Patient > 75%     Transfers Chair/bed transfer  Transfers assist     Chair/bed transfer assist level: Minimal Assistance - Patient > 75%     Locomotion Ambulation   Ambulation assist      Assist level: Moderate Assistance - Patient 50 - 74% Assistive device: No Device Max  distance: 23ft   Walk 10 feet activity   Assist     Assist level: Moderate Assistance - Patient - 50 - 74% Assistive device: No Device   Walk 50 feet activity   Assist Walk 50 feet with 2 turns activity did not occur: Safety/medical concerns  Assist level: Moderate Assistance - Patient - 50 - 74% Assistive device: No Device    Walk 150 feet activity   Assist Walk 150 feet activity did not occur: Safety/medical concerns         Walk 10 feet on uneven surface  activity   Assist Walk 10 feet on uneven surfaces activity did not occur: Safety/medical concerns         Wheelchair     Assist Will patient use wheelchair at discharge?:  (TBD)   Wheelchair activity did not occur: N/A         Wheelchair 50 feet with 2 turns  activity    Assist    Wheelchair 50 feet with 2 turns activity did not occur: N/A       Wheelchair 150 feet activity     Assist  Wheelchair 150 feet activity did not occur: N/A       Blood pressure 113/67, pulse 76, temperature 97.7 F (36.5 C), temperature source Oral, resp. rate 20, height 5\' 3"  (1.6 m), weight 90 kg, SpO2 99 %.  Medical Problem List and Plan: 1.  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  Antithrombotics: -DVT/anticoagulation: Lovenox             -antiplatelet therapy: Aspirin 81 mg daily and Plavix 75 mg daily x3 weeks and aspirin alone 3. Pain Management: N/A 4. Mood: Provide emotional support             -antipsychotic agents: N/A 5. Neuropsych: This patient is capable of making decisions on her own behalf. 6. Skin/Wound Care: Routine skin checks 7. Fluids/Electrolytes/Nutrition: Routine in and outs with follow-up chemistries 8.  New findings diabetes mellitus.  Hemoglobin A1c 7.4.  d/c glucophage given side effects. Already received today, start glipizide 2.5mg  tomorrow. Discussed minimizing added sugar and bread/pasta/rice- she is already doing. Diabetic teaching. CBGs improved. Provided list of foods good for diabetes.  CBG (last 3)  Recent Labs    03/11/21 1624 03/11/21 2102 03/12/21 0600  GLUCAP 95 109* 132*  Controlled off SSI, on glipizide  7/17- Bgs look great- con't regimen 9.  Hypertension.  Well controlled. Decrease lisinopril to 2.5mg  given hypotension and AKI.  Consider decreasing if creatinine continues to rise.  Vitals:   03/11/21 1915 03/12/21 0458  BP: 105/62 113/67  Pulse: 79 76  Resp: 20 20  Temp: 98.5 F (36.9 C) 97.7 F (36.5 C)  SpO2: 100% 99%   Controlled 7/14, now on low dose lisinopril  7/17- BP doing well- con't regimen 10.  Hyperlipidemia.  Lipitor 11.  Thyroid nodule.  Follow-up  outpatient 12. AKI: encouraged 6-8 glasses of water per day. Stop metformin. Decrease lisinopril to 2.5mg . Monitor creatinine.   D/C IVF recheck BMET in am, continue to monitor, p.o. fluid intake 1997 mL on 7/14  7/17- off IVFs- willd/c IV per pt request    LOS: 11 days A FACE TO FACE EVALUATION WAS PERFORMED  Esthela Brandner 03/12/2021, 10:59  AM

## 2021-03-13 LAB — BASIC METABOLIC PANEL
Anion gap: 6 (ref 5–15)
BUN: 31 mg/dL — ABNORMAL HIGH (ref 8–23)
CO2: 23 mmol/L (ref 22–32)
Calcium: 9.6 mg/dL (ref 8.9–10.3)
Chloride: 105 mmol/L (ref 98–111)
Creatinine, Ser: 1.1 mg/dL — ABNORMAL HIGH (ref 0.44–1.00)
GFR, Estimated: 53 mL/min — ABNORMAL LOW (ref >60)
Glucose, Bld: 109 mg/dL — ABNORMAL HIGH (ref 70–99)
Potassium: 4.2 mmol/L (ref 3.5–5.1)
Sodium: 134 mmol/L — ABNORMAL LOW (ref 135–145)

## 2021-03-13 LAB — GLUCOSE, CAPILLARY
Glucose-Capillary: 128 mg/dL — ABNORMAL HIGH (ref 70–99)
Glucose-Capillary: 106 mg/dL — ABNORMAL HIGH (ref 70–99)
Glucose-Capillary: 78 mg/dL (ref 70–99)

## 2021-03-13 MED ORDER — SODIUM CHLORIDE 0.45 % IV SOLN: INTRAVENOUS | Status: DC

## 2021-03-13 NOTE — Progress Notes (Signed)
Physical Therapy Session Note  Patient Details  Name: Carrie Kline MRN: [id number] Date of Birth: 05/12/1946  Today's Date: 03/13/2021 PT Individual Time: 0945-1030 and 800-900 PT Individual Time Calculation (min): 45 min and 60 min  Short Term Goals: Week 1:  PT Short Term Goal 1 (Week 1): Pt will complete bed mobility with MinA PT Short Term Goal 1 - Progress (Week 1): Progressing toward goal PT Short Term Goal 2 (Week 1): Pt will ambulate at least 92f with ModA PT Short Term Goal 2 - Progress (Week 1): Met PT Short Term Goal 3 (Week 1): Pt will transfer bed to chair with MinA using squat pivot consistently PT Short Term Goal 3 - Progress (Week 1): Met PT Short Term Goal 4 (Week 1): Pt will got up 4 steps using handrails and ModA PT Short Term Goal 4 - Progress (Week 1): Progressing toward goal Week 2:  PT Short Term Goal 1 (Week 2): Pt will perform supine<>sit with min assist PT Short Term Goal 2 (Week 2): Pt will perform sit<>stands using LRAD with CGA PT Short Term Goal 3 (Week 2): Pt will perform bed<>chair transfers via stand pivot using LRAD with min assist PT Short Term Goal 4 (Week 2): Pt will ambulate at least 549fusing LRAD with min assist of 1 PT Short Term Goal 5 (Week 2): Pt will initiate stair navigation training Week 3:     Skilled Therapeutic Interventions/Progress Updates:     First Session Pt received as handoff from nursing in [place] Transferred to wc via [place] and agreeable to therapy session.  Pt brushes teeth at sink w/set up only from wc level. Transported to/from gym in w/c for time management and energy conservation. Donned R LE ankle DF assist ACE wrap. Sit to stand to Litegait frame w/cga and set up in harness by therapist.  R hand secured w/acewrap to handle.   Stepped on/off treadmill using L UE support on bar with min assist for balance - cuing for sequencing steps. Performed the following locomotor treadimll training trials: 1st: 51m72m6sec at 0.4  increased for 115f92f cues for upright posture, cues to control tendency to flex/rotate at trunk to R w/stance R. 2nd: 51min34m 0.4mph 50maling 101 ft, cues as above. Pt requires several min seated rest due to dyspnea w/exertion.  Pt fatigued, requests rest prior to next session.   Transported back to room. Pt left oob in wc w/alarm belt set and needs in reach   Second session: Pt c/o mild R sided post occipital pain, nursing providing pain meds at beginning of session to address.    Pt transported to gym for session.   In parallel bars worked on LLE NMRE/stregnthening including: Sidestepping w/cues to attempt to decrease ER w/task, very difficult Forward stepping w/RLE w/cues to isolate hip and knee flexion and reduce circumduction for isolated control/strengthening Seated isolated knee flexion w/foot on sliding board/towel to decrease friction, AAROM extension Lateral isometric "stepping" w/foot placed on 5in step and pt instructed to isometrically load thru heel to isolate gluts/quads, 3 sec holds Pt w/dyspnea w/exertion, requires mult seated rest breaks during session. Pt transported back to room. Pt left oob in wc w/alarm belt set and needs in reach   Therapy Documentation Precautions:  Precautions Precautions: Fall Precaution Comments: R hemiparesis Restrictions Weight Bearing Restrictions: No     Therapy/Group: Individual Therapy [place]2022, 12:45 PM

## 2021-03-13 NOTE — Progress Notes (Signed)
Occupational Therapy Session Note  Patient Details  Name: Carrie Kline MRN: [id number] Date of Birth: [birth year]  Today's Date: 03/13/2021 OT Group Time: 1430-1530 OT Group Time Calculation (min): 60 min   Short Term Goals: Week 2:  OT Short Term Goal 1 (Week 2): Pt will complete UB bathing with supervision following hemi technique. OT Short Term Goal 2 (Week 2): Pt will complete UB dressing with supervision for two consecutive sessions. OT Short Term Goal 3 (Week 2): Pt will complete LB dressing with min assist sit to stand. OT Short Term Goal 4 (Week 2): Pt will complete toilet transfers with min assist stand pivot. OT Short Term Goal 5 (Week 2): Pt will use the RUE as at gross assist with min assist with selfcare tasks.  Skilled Therapeutic Interventions/Progress Updates:  Pt was seen for skilled group session with focus of group session on  stress management, social interaction, and  education on healthy coping strategies.  Pt reported no pain during session, pt declined standing activity during session. Pt cooperative, interactive and appreciative of group session. Pt transported back to room by rehab tech.   Therapy Documentation Precautions:  Precautions Precautions: Fall Precaution Comments: R hemiparesis Restrictions Weight Bearing Restrictions: No General:   Vital Signs: Therapy Vitals Temp: 98.1 F (36.7 C) Pulse Rate: 85 Resp: 17 BP: 125/74 Patient Position (if appropriate): Sitting Oxygen Therapy SpO2: 100 % O2 Device: Room Air Pain: Pt reports no pain during session.    Therapy/Group: Group Therapy  [name] 03/13/2021, 4:20 PM

## 2021-03-13 NOTE — Progress Notes (Signed)
Occupational Therapy Session Note  Patient Details  Name: Carrie Kline MRN: [id number] Date of Birth: 1946/05/03  Today's Date: 03/13/2021 OT Individual Time: 1305-1400 OT Individual Time Calculation (min): 55 min    Short Term Goals: Week 2:  OT Short Term Goal 1 (Week 2): Pt will complete UB bathing with supervision following hemi technique. OT Short Term Goal 2 (Week 2): Pt will complete UB dressing with supervision for two consecutive sessions. OT Short Term Goal 3 (Week 2): Pt will complete LB dressing with min assist sit to stand. OT Short Term Goal 4 (Week 2): Pt will complete toilet transfers with min assist stand pivot. OT Short Term Goal 5 (Week 2): Pt will use the RUE as at gross assist with min assist with selfcare tasks.  Skilled Therapeutic Interventions/Progress Updates:    Pt in the wheelchair to start session.  Took her down to the therapy gym where she completed stand pivot with mod assist to the therapy mat.  There she worked on transition to supine with mod assist for RUE neuromuscular re-education.  She was placed on a wedge for comfort and then worked on shoulder flexion using BUEs and a hula hoop.  Ace bandage was used for helping to sustain grip on the hula hoop with the RUE.  Worked on shoulder flexion exercises with mod facilitation and place and hold exercises with emphasis on sustaining elbow extension.  Mod assist needed for supporting the RUE throughout all motion.  Transitioned to sitting where she worked on functional reach for picking up bean bags with the left hand and placing them in different locations.  She was able to complete with mod facilitation.  Returned to the wheelchair at the end of the session with mod assist for stand pivot transfer back to the wheelchair.  Increased posterior lean at times with increased trunk flexion noted secondary to weakness in hip extension.  She was left up in the wheelchair with safety belt in place and call button and phone  in reach.    Therapy Documentation Precautions:  Precautions Precautions: Fall Precaution Comments: R hemiparesis Restrictions Weight Bearing Restrictions: No   Pain: Pain Assessment Pain Scale: Faces Pain Score: 0-No pain ADL: See Care Tool Section for some details of mobility and selfcare  Therapy/Group: Individual Therapy  Dontrelle Mazon OTR/L 03/13/2021, 4:18 PM

## 2021-03-13 NOTE — Progress Notes (Signed)
PROGRESS NOTE   Subjective/Complaints: Discussed BMET result , pt states she is still trying to drink fluids but recorded intake  <1082ml per day  ROS:  Pt denies SOB, abd pain, CP, N/V/C/D, and vision changes  Objective:   No results found. No results for input(s): WBC, HGB, HCT, PLT in the last 72 hours.  Recent Labs    03/13/21 0716  NA 134*  K 4.2  CL 105  CO2 23  GLUCOSE 109*  BUN 31*  CREATININE 1.10*  CALCIUM 9.6     Intake/Output Summary (Last 24 hours) at 03/13/2021 1109 Last data filed at 03/13/2021 0857 Gross per 24 hour  Intake 600 ml  Output --  Net 600 ml         Physical Exam: Vital Signs Blood pressure 115/65, pulse 80, temperature 98 F (36.7 C), temperature source Oral, resp. rate 18, height 5\' 3"  (1.6 m), weight 90 kg, SpO2 100 %.  General: No acute distress Mood and affect are appropriate Heart: Regular rate and rhythm no rubs murmurs or extra sounds Lungs: Clear to auscultation, breathing unlabored, no rales or wheezes Abdomen: Positive bowel sounds, soft nontender to palpation, nondistended Extremities: No clubbing, cyanosis, or edema Skin: No evidence of breakdown, no evidence of rash  Neurologic: Cranial nerves II through XII intact, motor strength is 5/5 in left deltoid, bicep, tricep, grip, hip flexor, knee extensors, ankle dorsiflexor and plantar flexor 3-/5 RUE bi, tri, 1/5 finger and wrist , 2- R thumb flexion and R hip /knee ext synergy, 0/5 distal RLE--stable exam Sensory exam normal sensation to light touch and proprioception in bilateral upper and lower extremities Neg mystagmus  Musculoskeletal: normal ROM, no pain    Assessment/Plan: 1. Functional deficits which require 3+ hours per day of interdisciplinary therapy in a comprehensive inpatient rehab setting. Physiatrist is providing close team supervision and 24 hour management of active medical problems listed  below. Physiatrist and rehab team continue to assess barriers to discharge/monitor patient progress toward functional and medical goals  Care Tool:  Bathing    Body parts bathed by patient: Right arm, Chest, Abdomen, Right upper leg, Left upper leg, Right lower leg, Face, Buttocks, Front perineal area, Left lower leg   Body parts bathed by helper: Left arm     Bathing assist Assist Level: Minimal Assistance - Patient > 75%     Upper Body Dressing/Undressing Upper body dressing   What is the patient wearing?: Pull over shirt    Upper body assist Assist Level: Minimal Assistance - Patient > 75%    Lower Body Dressing/Undressing Lower body dressing      What is the patient wearing?: Underwear/pull up, Pants     Lower body assist Assist for lower body dressing: Moderate Assistance - Patient 50 - 74%     Toileting Toileting    Toileting assist Assist for toileting: Minimal Assistance - Patient > 75%     Transfers Chair/bed transfer  Transfers assist     Chair/bed transfer assist level: Minimal Assistance - Patient > 75%     Locomotion Ambulation   Ambulation assist      Assist level: Moderate Assistance - Patient 50 -  74% Assistive device: No Device Max distance: 30ft   Walk 10 feet activity   Assist     Assist level: Moderate Assistance - Patient - 50 - 74% Assistive device: No Device   Walk 50 feet activity   Assist Walk 50 feet with 2 turns activity did not occur: Safety/medical concerns  Assist level: Moderate Assistance - Patient - 50 - 74% Assistive device: No Device    Walk 150 feet activity   Assist Walk 150 feet activity did not occur: Safety/medical concerns         Walk 10 feet on uneven surface  activity   Assist Walk 10 feet on uneven surfaces activity did not occur: Safety/medical concerns         Wheelchair     Assist Will patient use wheelchair at discharge?:  (TBD)   Wheelchair activity did not occur:  N/A         Wheelchair 50 feet with 2 turns activity    Assist    Wheelchair 50 feet with 2 turns activity did not occur: N/A       Wheelchair 150 feet activity     Assist  Wheelchair 150 feet activity did not occur: N/A       Blood pressure 115/65, pulse 80, temperature 98 F (36.7 C), temperature source Oral, resp. rate 18, height 5\' 3"  (1.6 m), weight 90 kg, SpO2 100 %.  Medical Problem List and Plan: 1.  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  Antithrombotics: -DVT/anticoagulation: Lovenox             -antiplatelet therapy: Aspirin 81 mg daily and Plavix 75 mg daily x3 weeks and aspirin alone 3. Pain Management: N/A 4. Mood: Provide emotional support             -antipsychotic agents: N/A 5. Neuropsych: This patient is capable of making decisions on her own behalf. 6. Skin/Wound Care: Routine skin checks 7. Fluids/Electrolytes/Nutrition: po fluids low, will restart IVF at noc  8.  New findings diabetes mellitus.  Hemoglobin A1c 7.4.  d/c glucophage given side effects. Already received today, start glipizide 2.5mg  tomorrow. Discussed minimizing added sugar and bread/pasta/rice- she is already doing. Diabetic teaching. CBGs improved. Provided list of foods good for diabetes.  CBG (last 3)  Recent Labs    03/12/21 1624 03/12/21 2049 03/13/21 0626  GLUCAP 115* 127* 128*   Controlled off SSI, on glipizide 2.5mg  daily 7/18  7/17- Bgs look great- con't regimen 9.  Hypertension.  Well controlled.  Vitals:   03/13/21 0336 03/13/21 0738  BP: 124/65 115/65  Pulse: 80   Resp: 18   Temp: 98 F (36.7 C)   SpO2: 100%    Controlled 7/18 on lisinopril 2.5 mg daily  10.  Hyperlipidemia.  Lipitor 11.  Thyroid nodule.  Follow-up outpatient 12. AKI: encouraged 6-8 glasses of water per day. Stop metformin. Decrease lisinopril to 2.5mg . Monitor  creatinine.   D/C IVF recheck BMET in am, continue to monitor, p.o. fluid intake 1997 mL on 7/14  7/17- off IVFs- willd/c IV per pt request 7/18 BUN 31, pt was drinking more last week, enc po fluids     LOS: 12 days A FACE TO FACE EVALUATION WAS PERFORMED  8/18 03/13/2021,  11:09 AM

## 2021-03-14 LAB — GLUCOSE, CAPILLARY
Glucose-Capillary: 108 mg/dL — ABNORMAL HIGH (ref 70–99)
Glucose-Capillary: 148 mg/dL — ABNORMAL HIGH (ref 70–99)
Glucose-Capillary: 138 mg/dL — ABNORMAL HIGH (ref 70–99)
Glucose-Capillary: 73 mg/dL (ref 70–99)
Glucose-Capillary: 155 mg/dL — ABNORMAL HIGH (ref 70–99)

## 2021-03-14 NOTE — Progress Notes (Signed)
Physical Therapy Session Note  Patient Details  Name: Carrie Kline MRN: [id number] Date of Birth: Jan 10, 1946  Today's Date: 03/14/2021 PT Individual Time: 9233-0076  PT Individual Time Calculation (min): 61 min    Short Term Goals: Week 1:  PT Short Term Goal 1 (Week 1): Pt will complete bed mobility with MinA PT Short Term Goal 1 - Progress (Week 1): Progressing toward goal PT Short Term Goal 2 (Week 1): Pt will ambulate at least 26f with ModA PT Short Term Goal 2 - Progress (Week 1): Met PT Short Term Goal 3 (Week 1): Pt will transfer bed to chair with MinA using squat pivot consistently PT Short Term Goal 3 - Progress (Week 1): Met PT Short Term Goal 4 (Week 1): Pt will got up 4 steps using handrails and ModA PT Short Term Goal 4 - Progress (Week 1): Progressing toward goal Week 2:  PT Short Term Goal 1 (Week 2): Pt will perform supine<>sit with min assist PT Short Term Goal 2 (Week 2): Pt will perform sit<>stands using LRAD with CGA PT Short Term Goal 3 (Week 2): Pt will perform bed<>chair transfers via stand pivot using LRAD with min assist PT Short Term Goal 4 (Week 2): Pt will ambulate at least 560fusing LRAD with min assist of 1 PT Short Term Goal 5 (Week 2): Pt will initiate stair navigation training   Skilled Therapeutic Interventions/Progress Updates:  Patient seated upright in w/c on entrance to room. Patient alert and agreeable to PT session. Patient denied pain during session.  Therapeutic Activity: Transfers: Patient performed STS transfers with SUP and SPVT transfers with CGA throughout session using RW and without AD. Provided verbal cues for pivot stepping technique, reaching back for UE support prior to descent to sit for safe positioning.  Gait Training:  Pt's RLE wrapped with short stretch wrap for DF assist. Patient ambulated 7569x1/ 120' x1 using RW with CGA/ Min A. Demonstrated gait pattern leading with RLE initially and improving throughout to step through  gait bilaterally. Good management of RW and level gaze. Pt self cueing for increasing step height, bringing R hip forward in stance, improving posture, and heel strike. Provided vc/ initially for increasing time on RLE and stepping through with LLE as well as for R knee extension in stance.  Neuromuscular Re-ed: NMR facilitated during session with focus on standing balance and increased muscle activation/ facilitation. Pt guided in STS activity using no AD with focus on collecting cards from L side at differing heights and distances from pt's BOS and performing rise to stance with no UE support with close supervision to match card to overhead height. Pt able to perform 3 bouts for 20 reps total. Requires seated therapeutic rest between bouts with water. Also guided in lateral dynamic stepping to R and L in order to improve hip strength and balance in weight shifting for improved overall mobility. NMR performed for improvements in motor control and coordination, balance, sequencing, judgement, and self confidence/ efficacy in performing all aspects of mobility at highest level of independence.   Patient seated  in w/c at end of session with brakes locked, belt alarm set, and all needs within reach. Provided with water per pt's request as MD states she is not ingesting enough fluids.     Therapy Documentation Precautions:  Precautions Precautions: Fall Precaution Comments: R hemiparesis Restrictions Weight Bearing Restrictions: No  Therapy/Group: Individual Therapy  JuAlger SimonsT, DPT 03/14/2021, 4:51 PM

## 2021-03-14 NOTE — Progress Notes (Signed)
Physical Therapy Session Note  Patient Details  Name: Carrie Kline MRN: [id number] Date of Birth: 1946-08-24  Today's Date: 03/14/2021 PT Individual Time: 0800-0900 PT Individual Time Calculation (min): 60 min   Short Term Goals: Week 2:  PT Short Term Goal 1 (Week 2): Pt will perform supine<>sit with min assist PT Short Term Goal 2 (Week 2): Pt will perform sit<>stands using LRAD with CGA PT Short Term Goal 3 (Week 2): Pt will perform bed<>chair transfers via stand pivot using LRAD with min assist PT Short Term Goal 4 (Week 2): Pt will ambulate at least 63ft using LRAD with min assist of 1 PT Short Term Goal 5 (Week 2): Pt will initiate stair navigation training  Skilled Therapeutic Interventions/Progress Updates:    Pt received seated on toilet in care of NT, this therapist takes over. Pt is setup A to change her shirt while seated on toilet. Sit to stand with Supervision to stedy. Pt is setup A for pericare in standing with stedy. Assisted pt with donning pants while seated on toilet, able to assist with pulling up pants over hips in standing, needs assist to get pants up over R hip. Stedy transfer to w/c. Pt is setup A for oral hygiene while seated in w/c at sink. Dependent transport via w/c to/from therapy gym for time and energy conservation. Sit to stand with min A to RW. Ambulation 4 x 50 ft with RW and min A for balance with use of R DF assist ACE wrap. Pt exhibits some path deviation to the R as well as decreased R hip flexion during gait. Standing alt L/R 1" step-taps with RW and min A for balance, focus on activation of R hip flexors and decreased use of compensations to complete task. Pt left seated in w/c in room with needs in reach, quick release belt and chair alarm in place at end of session.  Therapy Documentation Precautions:  Precautions Precautions: Fall Precaution Comments: R hemiparesis Restrictions Weight Bearing Restrictions: No    Therapy/Group: Individual  Therapy   Peter Congo, PT, DPT, CSRS  03/14/2021, 3:29 PM

## 2021-03-14 NOTE — Progress Notes (Signed)
Occupational Therapy Session Note  Patient Details  Name: Carrie Kline MRN: [id number] Date of Birth: 1945-10-15  Today's Date: 03/14/2021 OT Individual Time: 1000-1059 OT Individual Time Calculation (min): 59 min    Short Term Goals: Week 2:  OT Short Term Goal 1 (Week 2): Pt will complete UB bathing with supervision following hemi technique. OT Short Term Goal 2 (Week 2): Pt will complete UB dressing with supervision for two consecutive sessions. OT Short Term Goal 3 (Week 2): Pt will complete LB dressing with min assist sit to stand. OT Short Term Goal 4 (Week 2): Pt will complete toilet transfers with min assist stand pivot. OT Short Term Goal 5 (Week 2): Pt will use the RUE as at gross assist with min assist with selfcare tasks.  Skilled Therapeutic Interventions/Progress Updates:    Pt completed bathing and dressing during session.  Mod assist for functional mobility to the shower without use of an assistive device.  Min assist for removal of clothing prior to shower with min assist to complete bathing.  Mod facilitation needed for incorporating the RUE to wash the left shoulder and arm.  She was able to stand with the grab bar for support with min guard while washing her buttocks with the left hand.  Mod assist for transfer out of the shower stand pivot to the wheelchair for dressing tasks.  She needed mod facilitation for crossing the LLE over the right knee and maintaining for donning her pants, but was able to complete donning her underpants over it without assist from therapist to help hold it up.  Mod assist was needed for standing and pulling them up over her hips.  Therapist provided hand over hand mod assist to pull them up on the right side.  She was then able to donn her front fastening bra at max assist level and pullover shirt at min assist.  Therapist and pt discussed options for using a sports bra vs AE that can assist with one handed donning.  Finished session with donning of  gripper socks using one handed method as well.  Finished session with pt in the wheelchair and with the call button and phone in reach, safety belt in place as well.    Therapy Documentation Precautions:  Precautions Precautions: Fall Precaution Comments: R hemiparesis Restrictions Weight Bearing Restrictions: No  Pain: Pain Assessment Pain Scale: Faces Pain Score: 0-No pain ADL: See Care Tool Section for some details of mobility and selfcare  Therapy/Group: Individual Therapy  Reginna Sermeno OTR/L 03/14/2021, 12:51 PM

## 2021-03-14 NOTE — Progress Notes (Signed)
PROGRESS NOTE   Subjective/Complaints: Wants me to check a "knot" behind RIgh tear , no hx trauma, no pain  ROS:  Pt denies SOB, abd pain, CP, N/V/C/D, and vision changes  Objective:   No results found. No results for input(s): WBC, HGB, HCT, PLT in the last 72 hours.  Recent Labs    03/13/21 0716  NA 134*  K 4.2  CL 105  CO2 23  GLUCOSE 109*  BUN 31*  CREATININE 1.10*  CALCIUM 9.6     Intake/Output Summary (Last 24 hours) at 03/14/2021 0833 Last data filed at 03/14/2021 0730 Gross per 24 hour  Intake 1303.12 ml  Output --  Net 1303.12 ml         Physical Exam: Vital Signs Blood pressure (!) 102/58, pulse 83, temperature 99.3 F (37.4 C), temperature source Oral, resp. rate 16, height 5\' 3"  (1.6 m), weight 90 kg, SpO2 100 %. HEEN- no ext auricular abnormalities, no mastoid tenderness no cervical nodes  General: No acute distress Mood and affect are appropriate Heart: Regular rate and rhythm no rubs murmurs or extra sounds Lungs: Clear to auscultation, breathing unlabored, no rales or wheezes Abdomen: Positive bowel sounds, soft nontender to palpation, nondistended Extremities: No clubbing, cyanosis, or edema Skin: No evidence of breakdown, no evidence of rash eral upper and lower extremities  Neurologic: Cranial nerves II through XII intact, motor strength is 5/5 in left deltoid, bicep, tricep, grip, hip flexor, knee extensors, ankle dorsiflexor and plantar flexor 3-/5 RUE bi, tri, 1/5 finger and wrist , 2- R thumb flexion and R hip /knee ext synergy, 0/5 distal RLE--stable exam Sensory exam normal sensation to light touch and proprioception in bilateral upper and lower extremities Neg mystagmus  Musculoskeletal: normal ROM, no pain    Assessment/Plan: 1. Functional deficits which require 3+ hours per day of interdisciplinary therapy in a comprehensive inpatient rehab setting. Physiatrist is  providing close team supervision and 24 hour management of active medical problems listed below. Physiatrist and rehab team continue to assess barriers to discharge/monitor patient progress toward functional and medical goals  Care Tool:  Bathing    Body parts bathed by patient: Right arm, Chest, Abdomen, Right upper leg, Left upper leg, Right lower leg, Face, Buttocks, Front perineal area, Left lower leg   Body parts bathed by helper: Left arm     Bathing assist Assist Level: Minimal Assistance - Patient > 75%     Upper Body Dressing/Undressing Upper body dressing   What is the patient wearing?: Pull over shirt    Upper body assist Assist Level: Minimal Assistance - Patient > 75%    Lower Body Dressing/Undressing Lower body dressing      What is the patient wearing?: Underwear/pull up, Pants     Lower body assist Assist for lower body dressing: Moderate Assistance - Patient 50 - 74%     Toileting Toileting    Toileting assist Assist for toileting: Minimal Assistance - Patient > 75%     Transfers Chair/bed transfer  Transfers assist     Chair/bed transfer assist level: Minimal Assistance - Patient > 75%     Locomotion Ambulation   Ambulation assist  Assist level: Moderate Assistance - Patient 50 - 74% Assistive device: No Device Max distance: 83ft   Walk 10 feet activity   Assist     Assist level: Moderate Assistance - Patient - 50 - 74% Assistive device: No Device   Walk 50 feet activity   Assist Walk 50 feet with 2 turns activity did not occur: Safety/medical concerns  Assist level: Moderate Assistance - Patient - 50 - 74% Assistive device: No Device    Walk 150 feet activity   Assist Walk 150 feet activity did not occur: Safety/medical concerns         Walk 10 feet on uneven surface  activity   Assist Walk 10 feet on uneven surfaces activity did not occur: Safety/medical concerns         Wheelchair     Assist  Will patient use wheelchair at discharge?:  (TBD)   Wheelchair activity did not occur: N/A         Wheelchair 50 feet with 2 turns activity    Assist    Wheelchair 50 feet with 2 turns activity did not occur: N/A       Wheelchair 150 feet activity     Assist  Wheelchair 150 feet activity did not occur: N/A       Blood pressure (!) 102/58, pulse 83, temperature 99.3 F (37.4 C), temperature source Oral, resp. rate 16, height 5\' 3"  (1.6 m), weight 90 kg, SpO2 100 %.  Medical Problem List and Plan: 1.  Right side weakness secondary to left pontine infarct             -patient may  shower             -ELOS/Goals: 7/29 MinA/Sup, team conf in am   Continue CIR- PT, OT and SLP 2.  Antithrombotics: -DVT/anticoagulation: Lovenox             -antiplatelet therapy: Aspirin 81 mg daily and Plavix 75 mg daily x3 weeks and aspirin alone 3. Pain Management: N/A 4. Mood: Provide emotional support             -antipsychotic agents: N/A 5. Neuropsych: This patient is capable of making decisions on her own behalf. 6. Skin/Wound Care: Routine skin checks 7. Fluids/Electrolytes/Nutrition: po fluids low, will restart IVF at noc  8.  New findings diabetes mellitus.  Hemoglobin A1c 7.4.  d/c glucophage given side effects. Already received today, start glipizide 2.5mg  tomorrow. Discussed minimizing added sugar and bread/pasta/rice- she is already doing. Diabetic teaching. CBGs improved. Provided list of foods good for diabetes.  CBG (last 3)  Recent Labs    03/13/21 1121 03/13/21 1619 03/14/21 0657  GLUCAP 106* 78 148*   Controlled off SSI, on glipizide 2.5mg  daily 7/18  7/17- Bgs look great- con't regimen 9.  Hypertension.  Well controlled.  Vitals:   03/13/21 1928 03/14/21 0526  BP: (!) 107/56 (!) 102/58  Pulse: 83 83  Resp: 18 16  Temp: 98 F (36.7 C) 99.3 F (37.4 C)  SpO2: 100% 100%   Controlled 7/19 on lisinopril 2.5 mg daily  10.  Hyperlipidemia.  Lipitor 11.   Thyroid nodule.  Follow-up outpatient 12. AKI: encouraged 6-8 glasses of water per day. Stop metformin. Decrease lisinopril to 2.5mg . Monitor creatinine.  Restarted IVF due to elevated BUN will cont for now and recheck in 2-3 d     LOS: 13 days A FACE TO FACE EVALUATION WAS PERFORMED  8/19 03/14/2021, 8:33 AM

## 2021-03-15 LAB — GLUCOSE, CAPILLARY
Glucose-Capillary: 126 mg/dL — ABNORMAL HIGH (ref 70–99)
Glucose-Capillary: 151 mg/dL — ABNORMAL HIGH (ref 70–99)
Glucose-Capillary: 145 mg/dL — ABNORMAL HIGH (ref 70–99)
Glucose-Capillary: 110 mg/dL — ABNORMAL HIGH (ref 70–99)

## 2021-03-15 NOTE — Progress Notes (Signed)
Orthopedic Tech Progress Note Patient Details:  Carrie Kline 1946/03/21 [id number]  Called in order to [facility] for an AFO CONSULT   Patient ID: [name], female   DOB: [birth year], 75 y.o.   MRN: [id number]  Donald Pore 03/15/2021, 10:42 AM

## 2021-03-15 NOTE — Progress Notes (Signed)
Occupational Therapy Session Note  Patient Details  Name: Carrie Kline MRN: [id number] Date of Birth: [birth year]  Today's Date: 03/15/2021 OT Group Time: 1330-1433 OT Group Time Calculation (min): 63 min   Short Term Goals: Week 3:  OT Short Term Goal 1 (Week 3): Continue working on established LTGs at min assist overall.  Skilled Therapeutic Interventions/Progress Updates:  Pt was seen for skilled group session with focus of group session on social engagement, dynamic reaching from w/c level, RUE coordination, and activity tolerance. Pt engaged in therapeutic activity group where able to participate in corn hole from w/c level, pt mostly utilized LUE to toss bean bags but encouraged pt to use RUE as gross assist. Pt able to reach dynamically to score board with LUE to update score with overall supervision with no LOB. Pt able to practice w/c propulsion in tight spaces and practices maniupulating w/c parts such as leg rests and brakes. Pt transported back to room with total A where pt reported needing to void B+B, MIN A to stand pivot transfer from w/c to toilet with pt using grab bars, pt completed pericare with set- up assist in standing, pt transported to sink for hand hygiene. Pt left seated in w/c with alarm belt activated and all needs within reach.    Therapy Documentation Precautions:  Precautions Precautions: Fall Precaution Comments: R hemiparesis Restrictions Weight Bearing Restrictions: No  Pain: Pt reports no pain during session.    Therapy/Group: Group Therapy  [name] 03/15/2021, 4:21 PM

## 2021-03-15 NOTE — Progress Notes (Signed)
Physical Therapy Session Note  Patient Details  Name: Carrie Kline MRN: [id number] Date of Birth: 12/21/1945  Today's Date: 03/15/2021 PT Individual Time: 0930-1029 PT Individual Time Calculation (min): 59 min   Short Term Goals: Week 2:  PT Short Term Goal 1 (Week 2): Pt will perform supine<>sit with min assist PT Short Term Goal 2 (Week 2): Pt will perform sit<>stands using LRAD with CGA PT Short Term Goal 3 (Week 2): Pt will perform bed<>chair transfers via stand pivot using LRAD with min assist PT Short Term Goal 4 (Week 2): Pt will ambulate at least 30ft using LRAD with min assist of 1 PT Short Term Goal 5 (Week 2): Pt will initiate stair navigation training  Skilled Therapeutic Interventions/Progress Updates: Pt presents sitting in w/c and agreeable to therapy.  Pt requested need to use BR.  Pt transferred sit to stand w/ min A.  Pt amb into BR w/ RW and min A, verbal cues for safety transitioning into BR/ramp, but pt amb w/ improved LLE advancement.  Pt required min A for steadying while pt managed clothing.  Pt continent of urine in toilet, NT to chart.  Pt required mod A to manage clothing in standing.  Pt amb to w/c w/ min A.  Pt wheeled to main gym for time and energy conservation.  Ace wrap utilized to improve dorsiflexion to R foot.  Pt performed sidestepping in // bars w/ min A, verbal cues for weight shift to R for weightbearing for LLE stepping.  Pt using RUE only on // bars.  Pt amb 40' per trial w/ verbal cues for decreased weight shift to L when advancing RLE as demonstrates swing back and IC not on heel.  Improved gait noted when corrected.  Pt returned to room and remained in w/c w/ chair alarm on, 1/2 tray and all needs in reach.     Therapy Documentation Precautions:  Precautions Precautions: Fall Precaution Comments: R hemiparesis Restrictions Weight Bearing Restrictions: No General:   Vital Signs:  Pain:0/10 Pain Assessment Pain Scale: 0-10 Pain Score: 0-No  pain     Therapy/Group: Individual Therapy  Lucio Edward 03/15/2021, 10:29 AM

## 2021-03-15 NOTE — Progress Notes (Signed)
PROGRESS NOTE   Subjective/Complaints: No new issues except dry cogh since admit  ROS:  Pt denies SOB, abd pain, CP, N/V/C/D, and vision changes  Objective:   No results found. No results for input(s): WBC, HGB, HCT, PLT in the last 72 hours.  Recent Labs    03/13/21 0716  NA 134*  K 4.2  CL 105  CO2 23  GLUCOSE 109*  BUN 31*  CREATININE 1.10*  CALCIUM 9.6     Intake/Output Summary (Last 24 hours) at 03/15/2021 7408 Last data filed at 03/15/2021 0745 Gross per 24 hour  Intake 1373.67 ml  Output --  Net 1373.67 ml         Physical Exam: Vital Signs Blood pressure (!) 112/56, pulse 86, temperature 98.5 F (36.9 C), temperature source Oral, resp. rate 18, height 5' 3"  (1.6 m), weight 90 kg, SpO2 99 %.  General: No acute distress Mood and affect are appropriate Heart: Regular rate and rhythm no rubs murmurs or extra sounds Lungs: Clear to auscultation, breathing unlabored, no rales or wheezes Abdomen: Positive bowel sounds, soft nontender to palpation, nondistended Extremities: No clubbing, cyanosis, or edema Skin: No evidence of breakdown, no evidence of rash    Neurologic: Cranial nerves II through XII intact, motor strength is 5/5 in left deltoid, bicep, tricep, grip, hip flexor, knee extensors, ankle dorsiflexor and plantar flexor 3-/5 RUE bi, tri, 1/5 finger and wrist , 2- R thumb flexion and R hip /knee ext synergy, 0/5 distal RLE--stable exam Sensory exam normal sensation to light touch and proprioception in bilateral upper and lower extremities Neg mystagmus  Musculoskeletal: normal ROM, no pain    Assessment/Plan: 1. Functional deficits which require 3+ hours per day of interdisciplinary therapy in a comprehensive inpatient rehab setting. Physiatrist is providing close team supervision and 24 hour management of active medical problems listed below. Physiatrist and rehab team continue to  assess barriers to discharge/monitor patient progress toward functional and medical goals  Care Tool:  Bathing    Body parts bathed by patient: Right arm, Chest, Abdomen, Right upper leg, Left upper leg, Face, Buttocks, Front perineal area, Left lower leg   Body parts bathed by helper: Left arm, Right lower leg     Bathing assist Assist Level: Minimal Assistance - Patient > 75%     Upper Body Dressing/Undressing Upper body dressing   What is the patient wearing?: Pull over shirt, Bra    Upper body assist Assist Level: Moderate Assistance - Patient 50 - 74%    Lower Body Dressing/Undressing Lower body dressing      What is the patient wearing?: Underwear/pull up, Pants     Lower body assist Assist for lower body dressing: Minimal Assistance - Patient > 75%     Toileting Toileting    Toileting assist Assist for toileting: Minimal Assistance - Patient > 75%     Transfers Chair/bed transfer  Transfers assist     Chair/bed transfer assist level: Minimal Assistance - Patient > 75%     Locomotion Ambulation   Ambulation assist      Assist level: Minimal Assistance - Patient > 75% Assistive device: Walker-rolling Max distance: 66'  Walk 10 feet activity   Assist     Assist level: Minimal Assistance - Patient > 75% Assistive device: Walker-rolling   Walk 50 feet activity   Assist Walk 50 feet with 2 turns activity did not occur: Safety/medical concerns  Assist level: Minimal Assistance - Patient > 75% Assistive device: Walker-rolling    Walk 150 feet activity   Assist Walk 150 feet activity did not occur: Safety/medical concerns         Walk 10 feet on uneven surface  activity   Assist Walk 10 feet on uneven surfaces activity did not occur: Safety/medical concerns         Wheelchair     Assist Will patient use wheelchair at discharge?:  (TBD)   Wheelchair activity did not occur: N/A         Wheelchair 50 feet with 2  turns activity    Assist    Wheelchair 50 feet with 2 turns activity did not occur: N/A       Wheelchair 150 feet activity     Assist  Wheelchair 150 feet activity did not occur: N/A       Blood pressure (!) 112/56, pulse 86, temperature 98.5 F (36.9 C), temperature source Oral, resp. rate 18, height 5' 3"  (1.6 m), weight 90 kg, SpO2 99 %.  Medical Problem List and Plan: 1.  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 Formulized individual treatment plan based on medical history, underlying problem and comorbidities.   Continue CIR- PT, OT and SLP 2.  Antithrombotics: -DVT/anticoagulation: Lovenox             -antiplatelet therapy: Aspirin 81 mg daily and Plavix 75 mg daily x3 weeks and aspirin alone 3. Pain Management: N/A 4. Mood: Provide emotional support             -antipsychotic agents: N/A 5. Neuropsych: This patient is capable of making decisions on her own behalf. 6. Skin/Wound Care: Routine skin checks 7. Fluids/Electrolytes/Nutrition: po fluids low, will restart IVF at noc  8.  New findings diabetes mellitus.  Hemoglobin A1c 7.4.  d/c glucophage given side effects. Already received today, start glipizide 2.833m tomorrow. Discussed minimizing added sugar and bread/pasta/rice- she is already doing. Diabetic teaching. CBGs improved. Provided list of foods good for diabetes.  CBG (last 3)  Recent Labs    03/14/21 1612 03/14/21 2104 03/15/21 0652  GLUCAP 73 155* 126*   Controlled off SSI, on glipizide 2.556mdaily 7/18  7/17- Bgs look great- con't regimen 9.  Hypertension.  Well controlled.  Vitals:   03/14/21 2025 03/15/21 0348  BP: 110/63 (!) 112/56  Pulse: 84 86  Resp: 16 18  Temp: 98.9 F (37.2 C) 98.5 F (36.9 C)  SpO2: 100% 99%    Controlled 7/20 on lisinopril 2.5 mg daily  but will d/c due to dry cough and monitor  10.  Hyperlipidemia.  Lipitor 11.  Thyroid nodule.  Follow-up outpatient 12. AKI: encouraged 6-8 glasses of water per day. Stop metformin. Decrease lisinopril to 2.33m36mMonitor creatinine.  Restarted IVF due to elevated BUN will cont for now and recheck in 2-3 d     LOS: 14 days A FACE TO  FACE EVALUATION WAS PERFORMED  Charlett Blake 03/15/2021, 9:23 AM

## 2021-03-15 NOTE — Progress Notes (Signed)
Occupational Therapy Weekly Progress Note  Patient Details  Name: Carrie Kline MRN: [id number] Date of Birth: 03/18/1946  Beginning of progress report period: March 09, 2021 End of progress report period: March 15, 2021  Today's Date: 03/15/2021 OT Individual Time: 4098-1191 OT Individual Time Calculation (min): 57 min    Patient has met 1 of 5 short term goals.  Pt continues to make steady progress with OT at this time.  She is able to complete UB selfcare at min assist level and LB bathing at min assist as well.  She was able to complete LB dressing with mod assist following hemi techniques, approaching min assist level.  Transfers are still mod assist for stand pivot to the 3:1 with min assist for squat pivot.  Functional mobility short distances in the room are also at mod assist secondary to increased weakness in the right hip and knee.  RUE function is currently at a gross assist level with supervision and active assist level with overall mod assist.  She demonstrates Brunnstrum stage IV movement in the arm and hand with 80% digit flexion and extension present, but with decreased ability to maintain wrist at neutral with extension.  Overall feel that she is making steady progress toward established min assist level goals overall with anticipated discharge next week 7/30.  Recommend continued OT at CIR level at this time for continuation of progress toward these goals.    Patient continues to demonstrate the following deficits: muscle weakness, muscle joint tightness, and muscle paralysis, impaired timing and sequencing, abnormal tone, unbalanced muscle activation, and decreased coordination, and decreased standing balance, decreased postural control, hemiplegia, and decreased balance strategies and therefore will continue to benefit from skilled OT intervention to enhance overall performance with BADL and Reduce care partner burden.  Patient progressing toward long term goals..  Continue plan of  care.  OT Short Term Goals Week 3:  OT Short Term Goal 1 (Week 3): Continue working on established LTGs at min assist overall.  Skilled Therapeutic Interventions/Progress Updates:    Pt completed transfer from the wheelchair to the therapy mat with mod assist stand pivot.  Completed gentle stretching of anterior right shoulder to start with progression to weightbearing activity in sitting.  Had her place the RUE on the therapy mat while having her reach across with her left hand to place checkers in the [employer].  She needed min assist to maintain balance and for facilitation of elbow extension to keep from falling to the right when reaching.  Next, she transitioned to supine with mod assist with wedge under her head for comfort.  Had her focus on active elbow extension on the right without trunk compensation, working on pushing against surface therapist was holding.  Decreased ability to achieve the last 10% of elbow extension.  Also had her work on isolated elbow extension with arm positioned at 90 degrees shoulder flexion with therapist holding.  She was able to complete 2 sets of 10 reps with overall min assist.  Transitioned to sitting where the UE Ranger was integrated for focus on external rotation and elbow flexion while avoiding trunk compensation.  Finished session with mod assist stand pivot transfers to the wheelchair and the bedside recliner.  She was left with the call button and phone in reach and alarm pad underneath her.    Therapy Documentation Precautions:  Precautions Precautions: Fall Precaution Comments: R hemiparesis Restrictions Weight Bearing Restrictions: No  Pain:    ADL: See Care Tool Section  for some details of mobility and selfcare  Therapy/Group: Individual Therapy  MCGUIRE,JAMES OTR/L 03/15/2021, 12:12 PM

## 2021-03-15 NOTE — Patient Care Conference (Signed)
Inpatient RehabilitationTeam Conference and Plan of Care Update Date: 03/15/2021   Time: 10:30 AM    Patient Name: Carrie Kline      Medical Record Number: [id number]  Date of Birth: [birth year] Sex: Female         Room/Bed: 4W01C/4W01C-01 Payor Info: Payor: [employer] MEDICARE / Plan: BCBS MEDICARE / Product Type: *No Product type* /    Admit Date/Time:  03/01/2021  2:15 PM  Primary Diagnosis:  Brainstem infarct, acute [facility])  Hospital Problems: Principal Problem:   Brainstem infarct, acute [facility])    Expected Discharge Date: Expected Discharge Date: 03/24/21  Team Members Present: Physician leading conference: Dr. Claudette Laws Care Coodinator Present: Chana Bode, RN, BSN, CRRN;Christina Kindred, BSW Nurse Present: Chana Bode, RN PT Present: Casimiro Needle, PT OT Present: Perrin Maltese, OT SLP Present: Other (comment) Lorelee Market, SLP)     Current Status/Progress Goal Weekly Team Focus  Bowel/Bladder   Pt is continent of bowel and bladder  Pt will remain continent of bowel and bladder  Will assess qshift and PRN   Swallow/Nutrition/ Hydration             ADL's   Min assist for UB selfcare with min to mod for LB selfcare sit to stand.  Stand pivot transfers at mod assist level to the 3:1.  RUE currently Brunnstrum stage V in the hand and stage IV in the arm.  contact guard to min assist  neuromuscular re-education,  balance retraining, transfer training, DME education, neuromuscular re-education, pt/ family education   Mobility   mod A bed mobility, min A sit to stand and transfers with RW, min to mod A gait up to 90 ft with R DF assist wrap and RW  CGA overall at ambulatory level  R LE NMR, gait, AFO assessment   Communication             Safety/Cognition/ Behavioral Observations            Pain   Pt is pain free  Pt will remain pain free  Will assess qhsift and PRN   Skin   Pt's skin is intact  Pt's skin will remain intact  Will assess  qshift and PRN     Discharge Planning:  Discharging home with son and daughter to provide care   Team Discussion: Some return in right side. Better finger flexion noted with weak shoulder and elbow. Continue IVF at [facility] for now; MD checking labs.  Patient on target to meet rehab goals: yes, currently requires min assist for upper body bathing and dressing and lower body bathing. Requires mod assist for lower body dressing. Transfers min assist and able to ambulate with CGA. Goals for discharge set for CGA.  *See Care Plan and progress notes for long and short-term goals.   Revisions to Treatment Plan:  Recommend AFO for discharge   Teaching Needs: Safety, medications, secondary stroke risk management, transfers, etc.  Current Barriers to Discharge: Decreased caregiver support and Home enviroment access/layout  Possible Resolutions to Barriers: Family education     Medical Summary Current Status: On IVF for poor po intake, BP controlled  Barriers to Discharge: Nutrition means   Possible Resolutions to Barriers/Weekly Focus: Still needing IVF for adequate hydration   Continued Need for Acute Rehabilitation Level of Care: The patient requires daily medical management by a physician with specialized training in physical medicine and rehabilitation for the following reasons: Direction of a multidisciplinary physical rehabilitation program to maximize functional  independence : Yes Medical management of patient stability for increased activity during participation in an intensive rehabilitation regime.: Yes Analysis of laboratory values and/or radiology reports with any subsequent need for medication adjustment and/or medical intervention. : Yes   I attest that I was present, lead the team conference, and concur with the assessment and plan of the team.   Chana Bode B 03/15/2021, 2:21 PM

## 2021-03-15 NOTE — Progress Notes (Signed)
Physical Therapy Session Note  Patient Details  Name: Carrie Kline MRN: [id number] Date of Birth: 10-14-1945  Today's Date: 03/15/2021 PT Individual Time: 1431-1515 PT Individual Time Calculation (min): 44 min   Short Term Goals: Week 1:  PT Short Term Goal 1 (Week 1): Pt will complete bed mobility with MinA PT Short Term Goal 1 - Progress (Week 1): Progressing toward goal PT Short Term Goal 2 (Week 1): Pt will ambulate at least 19f with ModA PT Short Term Goal 2 - Progress (Week 1): Met PT Short Term Goal 3 (Week 1): Pt will transfer bed to chair with MinA using squat pivot consistently PT Short Term Goal 3 - Progress (Week 1): Met PT Short Term Goal 4 (Week 1): Pt will got up 4 steps using handrails and ModA PT Short Term Goal 4 - Progress (Week 1): Progressing toward goal Week 2:  PT Short Term Goal 1 (Week 2): Pt will perform supine<>sit with min assist PT Short Term Goal 2 (Week 2): Pt will perform sit<>stands using LRAD with CGA PT Short Term Goal 3 (Week 2): Pt will perform bed<>chair transfers via stand pivot using LRAD with min assist PT Short Term Goal 4 (Week 2): Pt will ambulate at least 527fusing LRAD with min assist of 1 PT Short Term Goal 5 (Week 2): Pt will initiate stair navigation training   Skilled Therapeutic Interventions/Progress Updates:  Patient seated in w/c on entrance to room. Patient alert and agreeable to PT session. Patient denied pain during session.  Therapeutic Activity: Transfers: Patient performed STS and SPVT transfers throughout session with CGA. Focus on STS with no UE support. Provided vc for technique and support.  Gait Training:  Pt guided in stair navigation and provided with visual demonstration with verbal instruction re: leading LE for ascent/ descent. Pt able to perform with CGA and vc to reinforce leading with LLE to ascend and close CGA with vc for clearing step with RLE to descend. Pt emotional after performance as she did not believe  she would progress to this level of mobility prior to return home. Pt performed with no DF assist to RLE.   Discussed with pt use of AFO on return home for DF assist and shown options for demo. Trialed a PLS AFO duing ambulation bout. Patient ambulated 200 feet using RW with CGA. Demonstrated step through gait pattern throughout as well as improved neutral positioning of foot as pt tended to ER hip/ tibia prior. Provided vc/ tc for increasing hip/ knee flexion for imprved quality of gait and foot clearance. Pt relates no discomfort or pain from AFO.  Patient seated  in recliner at end of session with brakes locked, seat alarm set, and all needs within reach.     Therapy Documentation Precautions:  Precautions Precautions: Fall Precaution Comments: R hemiparesis Restrictions Weight Bearing Restrictions: No  Therapy/Group: Individual Therapy  JuAlger SimonsT, DPT 03/15/2021, 5:17 PM

## 2021-03-15 NOTE — Progress Notes (Signed)
Patient ID: Carrie Kline, female   DOB: [birth year], 75 y.o.   MRN: [id number] Team Conference Report to Patient/Family  Team Conference discussion was reviewed with the patient and caregiver, including goals, any changes in plan of care and target discharge date.  Patient and caregiver express understanding and are in agreement.  The patient has a target discharge date of 03/24/21.  Sw met with patient and called daughter at bedside. Provided conference updates. Daughter FMLA paperwork complete. Family education scheduled March 22, 2021 1-3 PM  Dyanne Iha 03/15/2021, 1:40 PM

## 2021-03-16 LAB — GLUCOSE, CAPILLARY
Glucose-Capillary: 121 mg/dL — ABNORMAL HIGH (ref 70–99)
Glucose-Capillary: 153 mg/dL — ABNORMAL HIGH (ref 70–99)
Glucose-Capillary: 107 mg/dL — ABNORMAL HIGH (ref 70–99)
Glucose-Capillary: 115 mg/dL — ABNORMAL HIGH (ref 70–99)

## 2021-03-16 NOTE — Progress Notes (Signed)
Physical Therapy Weekly Progress Note  Patient Details  Name: Brenlee Koskela MRN: [id number] Date of Birth: 1945-10-04  Beginning of progress report period: March 09, 2021 End of progress report period: March 16, 2021  Patient has met 5 of 5 short term goals. Pt is continuing to make great progress towards her LTG. She is highly motivated, shows strong participation, and takes advantage of her opportunities with therapy. She requires minA for bed mobility, CGA for sit<>stand to RW, CGA for stand<>pivot transfers with RW, and can ambulate >17f with CGA and RW. She has initiated stair training and will continue to benefit from further stair training piror to DC.   Patient continues to demonstrate the following deficits muscle weakness, decreased cardiorespiratoy endurance, and decreased standing balance, decreased postural control, hemiplegia, and decreased balance strategies and therefore will continue to benefit from skilled PT intervention to increase functional independence with mobility.  Patient progressing toward long term goals..  Continue plan of care.  PT Short Term Goals Week 2:  PT Short Term Goal 1 (Week 2): Pt will perform supine<>sit with min assist PT Short Term Goal 1 - Progress (Week 2): Met PT Short Term Goal 2 (Week 2): Pt will perform sit<>stands using LRAD with CGA PT Short Term Goal 2 - Progress (Week 2): Met PT Short Term Goal 3 (Week 2): Pt will perform bed<>chair transfers via stand pivot using LRAD with min assist PT Short Term Goal 3 - Progress (Week 2): Met PT Short Term Goal 4 (Week 2): Pt will ambulate at least 530fusing LRAD with min assist of 1 PT Short Term Goal 4 - Progress (Week 2): Met PT Short Term Goal 5 (Week 2): Pt will initiate stair navigation training PT Short Term Goal 5 - Progress (Week 2): Met Week 3:  PT Short Term Goal 1 (Week 3): STG = LTG due to ELOS   Therapy Documentation Precautions:  Precautions Precautions: Fall Precaution  Comments: R hemiparesis Restrictions Weight Bearing Restrictions: No  Sherae Santino P Cydnee Fuquay 03/16/2021, 12:32 PM

## 2021-03-16 NOTE — Progress Notes (Signed)
PROGRESS NOTE   Subjective/Complaints: Dry cough resolved Trying to drink more, still getting IV fluids at night  ROS:  Pt denies SOB, abd pain, CP, N/V/C/D, and vision changes  Objective:   No results found. No results for input(s): WBC, HGB, HCT, PLT in the last 72 hours.  No results for input(s): NA, K, CL, CO2, GLUCOSE, BUN, CREATININE, CALCIUM in the last 72 hours.   Intake/Output Summary (Last 24 hours) at 03/16/2021 0903 Last data filed at 03/16/2021 0745 Gross per 24 hour  Intake 1246.3 ml  Output --  Net 1246.3 ml         Physical Exam: Vital Signs Blood pressure 126/62, pulse 84, temperature 98 F (36.7 C), temperature source Oral, resp. rate 18, height 5\' 3"  (1.6 m), weight 90 kg, SpO2 100 %.  General: No acute distress Mood and affect are appropriate Heart: Regular rate and rhythm no rubs murmurs or extra sounds Lungs: Clear to auscultation, breathing unlabored, no rales or wheezes Abdomen: Positive bowel sounds, soft nontender to palpation, nondistended Extremities: No clubbing, cyanosis, or edema Skin: No evidence of breakdown, no evidence of rash  Neurologic: Cranial nerves II through XII intact, motor strength is 5/5 in left deltoid, bicep, tricep, grip, hip flexor, knee extensors, ankle dorsiflexor and plantar flexor 3-/5 RUE bi, tri, 1/5 finger and wrist , 2- R thumb flexion and R hip /knee ext synergy, 0/5 distal RLE--stable exam Sensory exam normal sensation to light touch and proprioception in bilateral upper and lower extremities Neg mystagmus  Musculoskeletal: normal ROM, no pain    Assessment/Plan: 1. Functional deficits which require 3+ hours per day of interdisciplinary therapy in a comprehensive inpatient rehab setting. Physiatrist is providing close team supervision and 24 hour management of active medical problems listed below. Physiatrist and rehab team continue to assess  barriers to discharge/monitor patient progress toward functional and medical goals  Care Tool:  Bathing    Body parts bathed by patient: Right arm, Chest, Abdomen, Right upper leg, Left upper leg, Face, Buttocks, Front perineal area, Left lower leg   Body parts bathed by helper: Left arm, Right lower leg     Bathing assist Assist Level: Minimal Assistance - Patient > 75%     Upper Body Dressing/Undressing Upper body dressing   What is the patient wearing?: Pull over shirt, Bra    Upper body assist Assist Level: Moderate Assistance - Patient 50 - 74%    Lower Body Dressing/Undressing Lower body dressing      What is the patient wearing?: Underwear/pull up, Pants     Lower body assist Assist for lower body dressing: Minimal Assistance - Patient > 75%     Toileting Toileting    Toileting assist Assist for toileting: Set up assist     Transfers Chair/bed transfer  Transfers assist     Chair/bed transfer assist level: Contact Guard/Touching assist     Locomotion Ambulation   Ambulation assist      Assist level: Contact Guard/Touching assist Assistive device: Walker-rolling Max distance: 200   Walk 10 feet activity   Assist     Assist level: Supervision/Verbal cueing Assistive device: Walker-rolling   Walk 50 feet  activity   Assist Walk 50 feet with 2 turns activity did not occur: Safety/medical concerns  Assist level: Contact Guard/Touching assist Assistive device: Walker-rolling    Walk 150 feet activity   Assist Walk 150 feet activity did not occur: Safety/medical concerns  Assist level: Contact Guard/Touching assist Assistive device: Walker-rolling    Walk 10 feet on uneven surface  activity   Assist Walk 10 feet on uneven surfaces activity did not occur: Safety/medical concerns         Wheelchair     Assist Will patient use wheelchair at discharge?:  (TBD)   Wheelchair activity did not occur: N/A          Wheelchair 50 feet with 2 turns activity    Assist    Wheelchair 50 feet with 2 turns activity did not occur: N/A       Wheelchair 150 feet activity     Assist  Wheelchair 150 feet activity did not occur: N/A       Blood pressure 126/62, pulse 84, temperature 98 F (36.7 C), temperature source Oral, resp. rate 18, height 5\' 3"  (1.6 m), weight 90 kg, SpO2 100 %.  Medical Problem List and Plan: 1.  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  Antithrombotics: -DVT/anticoagulation: Lovenox             -antiplatelet therapy: Aspirin 81 mg daily and Plavix 75 mg daily x3 weeks and aspirin alone 3. Pain Management: N/A 4. Mood: Provide emotional support             -antipsychotic agents: N/A 5. Neuropsych: This patient is capable of making decisions on her own behalf. 6. Skin/Wound Care: Routine skin checks 7. Fluids/Electrolytes/Nutrition: po fluids low, will restart IVF at noc  8.  New findings diabetes mellitus.  Hemoglobin A1c 7.4.  d/c glucophage given side effects. Already received today, start glipizide 2.5mg  tomorrow. Discussed minimizing added sugar and bread/pasta/rice- she is already doing. Diabetic teaching. CBGs improved. Provided list of foods good for diabetes.  CBG (last 3)  Recent Labs    03/15/21 1703 03/15/21 2043 03/16/21 0632  GLUCAP 145* 110* 121*   Controlled 7/21 on glipizide 2.5mg  daily 7/18  7/17- Bgs look great- con't regimen 9.  Hypertension.  Well controlled.  Vitals:   03/15/21 1929 03/16/21 0306  BP: 101/62 126/62  Pulse: 76 84  Resp: 18 18  Temp: 98.3 F (36.8 C) 98 F (36.7 C)  SpO2: 100% 100%   Controlled 7/21 off lisinopril 2.5 mg daily  but will monitor for increased BPs 10.  Hyperlipidemia.  Lipitor 11.  Thyroid nodule.  Follow-up outpatient 12. AKI: encouraged 6-8 glasses of water per day. Stop  metformin. Decrease lisinopril to 2.5mg . Monitor creatinine.  Restarted IVF due to elevated BUN will cont for now and recheck in 1d     LOS: 15 days A FACE TO FACE EVALUATION WAS PERFORMED  8/21 03/16/2021, 9:03 AM

## 2021-03-16 NOTE — Progress Notes (Signed)
Physical Therapy Session Note  Patient Details  Name: Carrie Kline MRN: [id number] Date of Birth: Jul 19, 1946  Today's Date: 03/16/2021 PT Individual Time: Session1: 1400-1430; [place]: [id number] PT Individual Time Calculation (min): 30 min & 45 min  Short Term Goals: Week 2:  PT Short Term Goal 1 (Week 3): STG = LTG due to ELOS  Skilled Therapeutic Interventions/Progress Updates:    Session1: Patient in w/c in room and reports no pain from brace, but that orthotist feels she needs something that is better fitting and plans to see her tomorrow for eval.  Patient in w/c assisted to day room.  Removed brace to allow for assessment of stability during function.  Standing at hi-lo table with UE support with CGA for sit to stand pt performed dynamic standing to reach for cups and stack different patterns with CGA to close S using L UE to stabilize.  Patient performed side stepping holding table with CGA x 3 reps.  Performed R lateral step ups onto Wii balance board (~3" rise) x 10 reps with facilitation into R hip for stabilization.  Then performed side step ups over and down with both feet both ways x 6 reps with min A and occasional cues for placement of feet to allow room for other foot.  Patient assisted in w/c to room and left in chair with call bell in reach and table over top for safety and needs in reach.  Session2:  Patient in w/c and reports this is fifth session for today.  Discussed working till tired and may finish early if needed. Patient assisted in w/c to therapy gym.  Performed sit to stand to RW and ambulated x 50' with RW without brace min A and noting some steppage for R foot clearance, but no knee recurvatum on R.  Educated pt on energy conservation and using brace to hep extend activity tolerance.  Donned brace in sitting and pt ambulated another 53' with RW and R AFO noting less steppage pattern and improved mechanics especially with R hip facilitation for extension and cues for  upright posture.  Patient performed sit<>squat with armchair in front to work on Sports administrator of R leg with transition to prevent R knee extension during sit to stand.  Performed with RW and noted improved anterior weight shift and improved timing.  Patient performed standing weight shifts in stride with cue for R hip extension in stance and improved L pushoff.  Patient ambulated another 71' with RW with continued R hip facilitation for extension in stance.  Patient ambulated with L HHA x 50' with brace on and mod support for balance.  Discussed effect brace has on balance initially.  Patient assisted in w/c to room as reported fatigued.  Ambulated x 15' around bed to recliner with RW and min A.  Left in recliner with call bell and needs in reach and chair alarm active. Patient missed 15 minutes skilled PT due to fatigue.  Therapy Documentation Precautions:  Precautions Precautions: Fall Precaution Comments: R hemiparesis Restrictions Weight Bearing Restrictions: No General: PT Amount of Missed Time (min): 15 Minutes PT Missed Treatment Reason: Patient fatigue Pain: Pain Assessment Pain Score: 0-No pain    Therapy/Group: Individual Therapy  Elray Mcgregor Sheran Lawless, PT 03/16/2021, 5:43 PM

## 2021-03-16 NOTE — Progress Notes (Signed)
Occupational Therapy Session Note  Patient Details  Name: Carrie Kline MRN: [id number] Date of Birth: 02/02/1946  Today's Date: 03/16/2021 OT Individual Time: 3244-0102 OT Individual Time Calculation (min): 46 min    Short Term Goals: Week 3:  OT Short Term Goal 1 (Week 3): Continue working on established LTGs at min assist overall.  Skilled Therapeutic Interventions/Progress Updates:    Session 1: (7253-6644)  Pt completed bathing and dressing during session.  Min assist for functional mobility to the shower without an assistive device with decreased ability to efficiently advance the RLE and stabilize during weightbearing.  She was then able to remove clothing sit to stand with min assist overall before showering.  Bathing was completed with min assist sit to stand with use of the grab bar for support.  She was able to complete functional use of the RUE for washing the LUE with mod assist.  She was able to stabilize items for opening with occasional min assist.  Min assist for stand pivot transfer to the wheelchair for dressing at the sink.  She was able to donn a front fastening bra with max assist and then a pullover shirt with mod.  Discussed likely having better results with use of the sports bra.  She donned her underpants and pants with min assist following hemi-technique.  Socks were donned with mod assist and left shoe with setup.  Max assist for right shoe with AFO.  Finished session with completion of oral hygiene in sitting.  She was left with the call button and phone in reach and safety belt in place.    Session 2: (0347-4259) Pt completed transfer from the wheelchair to the therapy mat at min assist level stand pivot.  NMES was applied to the right triceps to assist with elbow extension during functional reach.  Intensity was set on level 7 with PPS at 35 and pulse width at 400.  On/off time was 10 seconds.  She was able to work on pushing the bedside table forward as stimulation was  active with min assist to avoid overcompensation with her trunk.  She tolerate 10 mins of active stimulation without any pain or adverse reactions.  Progressed to functional reach having her pick up small foam blocks from beside her and place in a container to promote elbow extension and flexion as well as finger flexion/extension.  Min assist for shoulder and elbow movement to reach target.  Transferred back to the wheelchair at conclusion of session at min assist level and then back to the recliner at the same level one in the room.  Call button and phone in reach with chair alarm in place.    Therapy Documentation Precautions:  Precautions Precautions: Fall Precaution Comments: R hemiparesis Restrictions Weight Bearing Restrictions: No   Pain: Pain Assessment Pain Scale: Faces Pain Score: 0-No pain Faces Pain Scale: No hurt ADL: See Care Tool Section for some details of mobility and selfcare  Therapy/Group: Individual Therapy  Costella Schwarz OTR/L 03/16/2021, 12:30 PM

## 2021-03-16 NOTE — Progress Notes (Signed)
Physical Therapy Session Note  Patient Details  Name: Carrie Kline MRN: [id number] Date of Birth: [birth year]  Today's Date: 03/16/2021 PT Individual Time: 1015-1055 PT Individual Time Calculation (min): 40 min   Short Term Goals: Week 2:  PT Short Term Goal 1 (Week 2): Pt will perform supine<>sit with min assist PT Short Term Goal 2 (Week 2): Pt will perform sit<>stands using LRAD with CGA PT Short Term Goal 3 (Week 2): Pt will perform bed<>chair transfers via stand pivot using LRAD with min assist PT Short Term Goal 4 (Week 2): Pt will ambulate at least 34f using LRAD with min assist of 1 PT Short Term Goal 5 (Week 2): Pt will initiate stair navigation training  Skilled Therapeutic Interventions/Progress Updates:     Pt greeted seated in w/c to start session, awake and agreeable to therapy. She reports no pain. W/c transport to dayroom rehab gym for time management. Pt with R AFO on throughout session. Sit<>Stand with CGA to RW and ambulated ~1765fwith CGA and RW - gait deficits include decreased R weight shift, R step length and R foot clearance. Minimal cues needed for correcting heel strike - she voiced understanding of corrections and good ability to initiate. Stand>sit to mat table with CGA and RW and pt had great understanding of safety approach and general sequencing with RW. During seated rest break, discussed DC planning, coping strategies, and biopsychosocial issues related to her stroke and how this will affect her moving forward. Pt appears to have a great outlook on life and her new identity with stroke recovery. Next, we worked on NMe[employer]or RLE with toe taps up/down to 6i[employer]ith CGA for balance and RW support - required AAROM for raising RLE onto and off of the platform but assist level decreased with repetitions. Also worked on stTechnical sales engineeror RLE as she stepped up with LLE. She reports she has 10 steps to her 2nd floor at her home and plans on stepping up 5 + 5 while  taking a seated rest on the 5th step - educated her on stair negotiation and importance of continued practice to improve her ability to safely navigate her home and reduce her falls risk - she voiced understanding. Ambulated ~7530fack to her w/c with CGA and RW and then she was returned to her room in her w/c where she requested to stay in her w/c until her upcoming OT session. All needs met at end of session.   Therapy Documentation Precautions:  Precautions Precautions: Fall Precaution Comments: R hemiparesis Restrictions Weight Bearing Restrictions: No General:     Therapy/Group: Individual Therapy  ChrAlger Simons21/2022, 6:39 AM

## 2021-03-17 LAB — BASIC METABOLIC PANEL
Anion gap: 6 (ref 5–15)
BUN: 23 mg/dL (ref 8–23)
CO2: 22 mmol/L (ref 22–32)
Calcium: 9.4 mg/dL (ref 8.9–10.3)
Chloride: 108 mmol/L (ref 98–111)
Creatinine, Ser: 1.03 mg/dL — ABNORMAL HIGH (ref 0.44–1.00)
GFR, Estimated: 57 mL/min — ABNORMAL LOW (ref >60)
Glucose, Bld: 100 mg/dL — ABNORMAL HIGH (ref 70–99)
Potassium: 4.4 mmol/L (ref 3.5–5.1)
Sodium: 136 mmol/L (ref 135–145)

## 2021-03-17 LAB — GLUCOSE, CAPILLARY
Glucose-Capillary: 105 mg/dL — ABNORMAL HIGH (ref 70–99)
Glucose-Capillary: 69 mg/dL — ABNORMAL LOW (ref 70–99)
Glucose-Capillary: 96 mg/dL (ref 70–99)
Glucose-Capillary: 86 mg/dL (ref 70–99)
Glucose-Capillary: 118 mg/dL — ABNORMAL HIGH (ref 70–99)

## 2021-03-17 NOTE — Progress Notes (Signed)
PROGRESS NOTE   Subjective/Complaints:  No issues overnite , discussed labwork   ROS:  Pt denies SOB, abd pain, CP, N/V/C/D, and vision changes  Objective:   No results found. No results for input(s): WBC, HGB, HCT, PLT in the last 72 hours.  Recent Labs    03/17/21 0446  NA 136  K 4.4  CL 108  CO2 22  GLUCOSE 100*  BUN 23  CREATININE 1.03*  CALCIUM 9.4     Intake/Output Summary (Last 24 hours) at 03/17/2021 0903 Last data filed at 03/17/2021 0800 Gross per 24 hour  Intake 840 ml  Output --  Net 840 ml         Physical Exam: Vital Signs Blood pressure 104/63, pulse 80, temperature 98.2 F (36.8 C), resp. rate 18, height 5\' 3"  (1.6 m), weight 90 kg, SpO2 97 %.  General: No acute distress Mood and affect are appropriate Heart: Regular rate and rhythm no rubs murmurs or extra sounds Lungs: Clear to auscultation, breathing unlabored, no rales or wheezes Abdomen: Positive bowel sounds, soft nontender to palpation, nondistended Extremities: No clubbing, cyanosis, or edema   Neurologic: Cranial nerves II through XII intact, motor strength is 5/5 in left deltoid, bicep, tricep, grip, hip flexor, knee extensors, ankle dorsiflexor and plantar flexor 3-/5 RUE bi, tri, 1/5 finger and wrist , 2- R thumb flexion and R hip /knee ext synergy, 0/5 distal RLE--stable exam Sensory exam normal sensation to light touch and proprioception in bilateral upper and lower extremities Neg mystagmus  Musculoskeletal: normal ROM, no pain    Assessment/Plan: 1. Functional deficits which require 3+ hours per day of interdisciplinary therapy in a comprehensive inpatient rehab setting. Physiatrist is providing close team supervision and 24 hour management of active medical problems listed below. Physiatrist and rehab team continue to assess barriers to discharge/monitor patient progress toward functional and medical goals  Care  Tool:  Bathing    Body parts bathed by patient: Right arm, Chest, Abdomen, Right upper leg, Left upper leg, Face, Buttocks, Front perineal area, Left lower leg   Body parts bathed by helper: Left arm, Right lower leg     Bathing assist Assist Level: Minimal Assistance - Patient > 75%     Upper Body Dressing/Undressing Upper body dressing   What is the patient wearing?: Pull over shirt, Bra    Upper body assist Assist Level: Moderate Assistance - Patient 50 - 74%    Lower Body Dressing/Undressing Lower body dressing      What is the patient wearing?: Underwear/pull up, Pants     Lower body assist Assist for lower body dressing: Minimal Assistance - Patient > 75%     Toileting Toileting    Toileting assist Assist for toileting: Set up assist     Transfers Chair/bed transfer  Transfers assist     Chair/bed transfer assist level: Contact Guard/Touching assist     Locomotion Ambulation   Ambulation assist      Assist level: Minimal Assistance - Patient > 75% Assistive device: No Device Max distance: 10'   Walk 10 feet activity   Assist     Assist level: Supervision/Verbal cueing Assistive device: Walker-rolling  Walk 50 feet activity   Assist Walk 50 feet with 2 turns activity did not occur: Safety/medical concerns  Assist level: Contact Guard/Touching assist Assistive device: Walker-rolling    Walk 150 feet activity   Assist Walk 150 feet activity did not occur: Safety/medical concerns  Assist level: Contact Guard/Touching assist Assistive device: Walker-rolling    Walk 10 feet on uneven surface  activity   Assist Walk 10 feet on uneven surfaces activity did not occur: Safety/medical concerns         Wheelchair     Assist Will patient use wheelchair at discharge?:  (TBD)   Wheelchair activity did not occur: N/A         Wheelchair 50 feet with 2 turns activity    Assist    Wheelchair 50 feet with 2 turns  activity did not occur: N/A       Wheelchair 150 feet activity     Assist  Wheelchair 150 feet activity did not occur: N/A       Blood pressure 104/63, pulse 80, temperature 98.2 F (36.8 C), resp. rate 18, height 5\' 3"  (1.6 m), weight 90 kg, SpO2 97 %.  Medical Problem List and Plan: 1.  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  Antithrombotics: -DVT/anticoagulation: Lovenox             -antiplatelet therapy: Aspirin 81 mg daily and Plavix 75 mg daily x3 weeks and aspirin alone 3. Pain Management: N/A 4. Mood: Provide emotional support             -antipsychotic agents: N/A 5. Neuropsych: This patient is capable of making decisions on her own behalf. 6. Skin/Wound Care: Routine skin checks 7. Fluids/Electrolytes/Nutrition: po fluids low, will restart IVF at noc  8.  New findings diabetes mellitus.  Hemoglobin A1c 7.4.  d/c glucophage given side effects. Already received today, start glipizide 2.5mg  tomorrow. Discussed minimizing added sugar and bread/pasta/rice- she is already doing. Diabetic teaching. CBGs improved. Provided list of foods good for diabetes.  CBG (last 3)  Recent Labs    03/16/21 1629 03/16/21 2051 03/17/21 0612  GLUCAP 107* 115* 105*   Controlled 7/22 on glipizide 2.5mg  daily   7/17- Bgs look great- con't regimen 9.  Hypertension.  Well controlled.  Vitals:   03/16/21 1947 03/17/21 0614  BP: 117/66 104/63  Pulse: 89 80  Resp: 17 18  Temp: 98 F (36.7 C) 98.2 F (36.8 C)  SpO2: 100% 97%   Controlled 7/21 off lisinopril 2.5 mg daily  but will monitor for increased BPs 10.  Hyperlipidemia.  Lipitor 11.  Thyroid nodule.  Follow-up outpatient 12. AKI: encouraged 6-8 glasses of water per day. Stop metformin. Decrease lisinopril to 2.5mg . Monitor creatinine.  Resolved d/c iv an drecheck bmet next week     LOS: 16 days A FACE  TO FACE EVALUATION WAS PERFORMED  8/21 03/17/2021, 9:03 AM

## 2021-03-17 NOTE — Progress Notes (Signed)
Physical Therapy Session Note  Patient Details  Name: Carrie Kline MRN: [id number] Date of Birth: November 21, 1945  Today's Date: 03/17/2021 PT Individual Time: 0900-0930 PT Individual Time Calculation (min): 30 min   Short Term Goals: Week 1:  PT Short Term Goal 1 (Week 1): Pt will complete bed mobility with MinA PT Short Term Goal 1 - Progress (Week 1): Progressing toward goal PT Short Term Goal 2 (Week 1): Pt will ambulate at least 50f with ModA PT Short Term Goal 2 - Progress (Week 1): Met PT Short Term Goal 3 (Week 1): Pt will transfer bed to chair with MinA using squat pivot consistently PT Short Term Goal 3 - Progress (Week 1): Met PT Short Term Goal 4 (Week 1): Pt will got up 4 steps using handrails and ModA PT Short Term Goal 4 - Progress (Week 1): Progressing toward goal Week 2:  PT Short Term Goal 1 (Week 2): Pt will perform supine<>sit with min assist PT Short Term Goal 1 - Progress (Week 2): Met PT Short Term Goal 2 (Week 2): Pt will perform sit<>stands using LRAD with CGA PT Short Term Goal 2 - Progress (Week 2): Met PT Short Term Goal 3 (Week 2): Pt will perform bed<>chair transfers via stand pivot using LRAD with min assist PT Short Term Goal 3 - Progress (Week 2): Met PT Short Term Goal 4 (Week 2): Pt will ambulate at least 52fusing LRAD with min assist of 1 PT Short Term Goal 4 - Progress (Week 2): Met PT Short Term Goal 5 (Week 2): Pt will initiate stair navigation training PT Short Term Goal 5 - Progress (Week 2): Met  Skilled Therapeutic Interventions/Progress Updates:    Pt seated in w/c on arrival and agreeable to therapy. No complaint of pain. Donned shoes with R AFO with assist for time. Pt transported to day room. Gait x 50 ft with RW and CGA. Pt demoes poor foot clearance and poor R hip extension. Pt instructed in 1/2 prone hip extension leaning onto elevated mat table. Pt performed hip extension to fatigue. Gait x 200 ft with improved hip extension. Pt returned  to room and remained seated in w/c with all needs in reach and alarm active.   Therapy Documentation Precautions:  Precautions Precautions: Fall Precaution Comments: R hemiparesis Restrictions Weight Bearing Restrictions: No     Therapy/Group: Individual Therapy  OlMickel Fuchs/22/2022, 5:00 PM

## 2021-03-17 NOTE — Progress Notes (Signed)
Physical Therapy Session Note  Patient Details  Name: Carrie Kline MRN: [id number] Date of Birth: [birth year]  Today's Date: 03/17/2021 PT Individual Time: 1300-1355 PT Individual Time Calculation (min): 55 min   Short Term Goals: Week 3:  PT Short Term Goal 1 (Week 3): STG = LTG due to ELOS  Skilled Therapeutic Interventions/Progress Updates:     Pt greeted seated in w/c to start session, agreeable to PT tx. She denies any pain during session. Focus of session to complete AFO consult with [facility]) with [facility]. PT facilitated discussion and collaboration to determine pt's needs to improve safety and adaptability with functional mobility. Pt wheeled to main rehab gym for time management to meet with Thayer Ohm. AFO consult completed and pt received PLS AFO and this was customized to pt's shoe and fit (pt brought in new [place] shoes which needed modified to fit brace). During AFO consult, pt ambulated several short bouts of ambulation with RW and CGA (with vs without brace) on level surfaces. Without AFO, gait deficits include decreased L heel strike, decreased L heel<>toe progressions, poor L foot clearance, and increased hip hiking with circumduction. With AFO, gait deficits greatly reduce and pt demonstrates improved safety as well as efficiency with gait cycle - continues to hip hike and circumduct but this is minimal compared to without AFO. After AFO consult completed, focused remainder of session on stair training. She was able to navigate up/down x8 steps with minA and 2 hand rails with cues needed for stepping pattern (up with L foot, down with R foot) as well as for advancing BUE's along hand rails for added safety - no knee buckling or LOB but unsteadiness present, especially with descent. Pt very pleased with her progress. She was returned to her room at end of session and remained seated in w/c with safety belt alarm on and all needs within reach, waiting for upcoming OT  session.  Therapy Documentation Precautions:  Precautions Precautions: Fall Precaution Comments: R hemiparesis Restrictions Weight Bearing Restrictions: No General:    Therapy/Group: Individual Therapy  Orrin Brigham 03/17/2021, 7:38 AM

## 2021-03-17 NOTE — Progress Notes (Signed)
Occupational Therapy Session Note  Patient Details  Name: Carrie Kline MRN: [id number] Date of Birth: 1945-12-12  Today's Date: 03/17/2021 OT Individual Time: 9323-5573 OT Individual Time Calculation (min): 56 min    Short Term Goals: Week 3:  OT Short Term Goal 1 (Week 3): Continue working on established LTGs at min assist overall.  Skilled Therapeutic Interventions/Progress Updates:    Session 1: ([id number])  Pt completed toileting, showering, and dressing during session.  She was able to ambulate to the toilet from the wheelchair at bedside with min assist and AFO in place.  She was able to complete toilet hygiene and clothing management with min assist sit to stand.  Next, had her transfer over to the walk-in shower with min assist using the grab bars for support.  She was able to complete all bathing with min assist overall and mod assist for crossing the RLE over the left knee and maintaining for washing it.  Mod assist was also needed for using the RUE to wash the left arm.  Min assist for transfer to the wheelchair with use of the RUE for 50% of task to apply lotion to her body with mod facilitation.  She then completed donning her front fastening bra with max assist and then pullover shirt with min assist.  She was able to donn her underpants and pants with min facilitation overall as well.  Mod assist was needed for donning gripper socks to complete session.  She was left in the wheelchair with the call button and phone in reach and safety belt in place.    Session 2: [phone]) Pt completed toilet transfer to start with use of the RW and min assist level.  She ambulated into the bathroom with her AFO in place as well and completed all aspects of toileting with min assist.  She returned to the sink where she washed her hands in standing at min assist and then transferred back to the wheelchair.  Took her down to the ortho gym where she worked on [employer] with use of the UE  ergonometer.  Resistance was left on level 1 with ace bandage used to help maintain grip on the right handle.  She was able to complete 3 sets of 2 mins each with min to mod assist to avoid trunk compensations.  Finished session with return to the room and transfer to the recliner with min assist and no device.  She was left with the call button and phone in reach with safety alarm in place.    Therapy Documentation Precautions:  Precautions Precautions: Fall Precaution Comments: R hemiparesis Restrictions Weight Bearing Restrictions: No  Pain:  No report of pain ADL: See Care Tool Section for some details of mobility and selfcare  Therapy/Group: Individual Therapy  Jobe Mutch OTR/L 03/17/2021, 12:27 PM

## 2021-03-18 LAB — GLUCOSE, CAPILLARY
Glucose-Capillary: 99 mg/dL (ref 70–99)
Glucose-Capillary: 93 mg/dL (ref 70–99)
Glucose-Capillary: 98 mg/dL (ref 70–99)
Glucose-Capillary: 89 mg/dL (ref 70–99)

## 2021-03-18 NOTE — Progress Notes (Signed)
Physical Therapy Session Note  Patient Details  Name: Carrie Kline MRN: [id number] Date of Birth: 11/12/1945  Today's Date: 03/18/2021 PT Individual Time: 1100-1200 PT Individual Time Calculation (min): 60 min   Short Term Goals: Week 2:  PT Short Term Goal 1 (Week 2): Pt will perform supine<>sit with min assist PT Short Term Goal 1 - Progress (Week 2): Met PT Short Term Goal 2 (Week 2): Pt will perform sit<>stands using LRAD with CGA PT Short Term Goal 2 - Progress (Week 2): Met PT Short Term Goal 3 (Week 2): Pt will perform bed<>chair transfers via stand pivot using LRAD with min assist PT Short Term Goal 3 - Progress (Week 2): Met PT Short Term Goal 4 (Week 2): Pt will ambulate at least 104f using LRAD with min assist of 1 PT Short Term Goal 4 - Progress (Week 2): Met PT Short Term Goal 5 (Week 2): Pt will initiate stair navigation training PT Short Term Goal 5 - Progress (Week 2): Met  Skilled Therapeutic Interventions/Progress Updates: Pt presents sitting in recliner and was unaware that she had therapy.  Pt is agreeable to participate in therapy.  Pt required max A to don socks and shoes w/ R AFO.  Pt amb x 10' in room to w/c w/ min A.  Pt w/ noted circumduction R LE to advance foot.  Pt wheeled to main gym for time management.  Pt performed multiple trials of sidestepping in // bars, verbal cues for posture and placement using mirror.  Pt performed toe-taps to cone, but unable to perform w/ RLE, so decreased to red cup.  Pt amb 92' x 3 w/ RW and min A.  Pt required verbal cues for RLE technique to decrease circumduction and improve posture.  Pt requested need for BR and returned to hallway near room.  Pt amb x 25' into BR and was able to manage clothing for use of toilet.  Pt continent of urine in toilet and performed pericare independently, but required min A for underwear on R side.  Pt amb to sink and performed handwashing w/ supervision.  NT notified and will chart on toileting.  Pt  returned to recliner, total A to doff AFO and shoes.  Seat alarm on and all needs in reach.     Therapy Documentation Precautions:  Precautions Precautions: Fall Precaution Comments: R hemiparesis Restrictions Weight Bearing Restrictions: No General:   Vital Signs:   Pain:no c/o pain.       Therapy/Group: Individual Therapy  JLadoris Gene7/23/2022, 12:04 PM

## 2021-03-19 LAB — GLUCOSE, CAPILLARY
Glucose-Capillary: 108 mg/dL — ABNORMAL HIGH (ref 70–99)
Glucose-Capillary: 75 mg/dL (ref 70–99)
Glucose-Capillary: 126 mg/dL — ABNORMAL HIGH (ref 70–99)
Glucose-Capillary: 106 mg/dL — ABNORMAL HIGH (ref 70–99)

## 2021-03-19 NOTE — Progress Notes (Signed)
PROGRESS NOTE   Subjective/Complaints:  No issues overnite, pt has no therapy today , bowels and bladder doing well   ROS:  Pt denies SOB, abd pain, CP, N/V/C/D, and vision changes  Objective:   No results found. No results for input(s): WBC, HGB, HCT, PLT in the last 72 hours.  Recent Labs    03/17/21 0446  NA 136  K 4.4  CL 108  CO2 22  GLUCOSE 100*  BUN 23  CREATININE 1.03*  CALCIUM 9.4     Intake/Output Summary (Last 24 hours) at 03/19/2021 0853 Last data filed at 03/19/2021 0740 Gross per 24 hour  Intake 270 ml  Output --  Net 270 ml         Physical Exam: Vital Signs Blood pressure 106/60, pulse 83, temperature 98 F (36.7 C), temperature source Oral, resp. rate 19, height 5\' 3"  (1.6 m), weight 90 kg, SpO2 98 %.   General: No acute distress Mood and affect are appropriate Heart: Regular rate and rhythm no rubs murmurs or extra sounds Lungs: Clear to auscultation, breathing unlabored, no rales or wheezes Abdomen: Positive bowel sounds, soft nontender to palpation, nondistended Extremities: No clubbing, cyanosis, or edema Skin: No evidence of breakdown, no evidence of rash  Neurologic: Cranial nerves II through XII intact, motor strength is 5/5 in left deltoid, bicep, tricep, grip, hip flexor, knee extensors, ankle dorsiflexor and plantar flexor 3-/5 RUE bi, tri, 1/5 finger and wrist , 2- R thumb flexion and 30 R hip /knee ext synergy, 0/5 distal RLE--stable exam Sensory exam normal sensation to light touch and proprioception in bilateral upper and lower extremities Neg mystagmus  Musculoskeletal: normal ROM, no pain    Assessment/Plan: 1. Functional deficits which require 3+ hours per day of interdisciplinary therapy in a comprehensive inpatient rehab setting. Physiatrist is providing close team supervision and 24 hour management of active medical problems listed below. Physiatrist and rehab  team continue to assess barriers to discharge/monitor patient progress toward functional and medical goals  Care Tool:  Bathing    Body parts bathed by patient: Right arm, Chest, Abdomen, Right upper leg, Left upper leg, Face, Buttocks, Front perineal area, Left lower leg   Body parts bathed by helper: Left arm, Right lower leg     Bathing assist Assist Level: Minimal Assistance - Patient > 75%     Upper Body Dressing/Undressing Upper body dressing   What is the patient wearing?: Pull over shirt, Bra    Upper body assist Assist Level: Moderate Assistance - Patient 50 - 74%    Lower Body Dressing/Undressing Lower body dressing      What is the patient wearing?: Underwear/pull up, Pants     Lower body assist Assist for lower body dressing: Minimal Assistance - Patient > 75%     Toileting Toileting    Toileting assist Assist for toileting: Minimal Assistance - Patient > 75%     Transfers Chair/bed transfer  Transfers assist     Chair/bed transfer assist level: Contact Guard/Touching assist     Locomotion Ambulation   Ambulation assist      Assist level: Contact Guard/Touching assist Assistive device: Walker-rolling Max distance: 92  Walk 10 feet activity   Assist     Assist level: Contact Guard/Touching assist Assistive device: Walker-rolling   Walk 50 feet activity   Assist Walk 50 feet with 2 turns activity did not occur: Safety/medical concerns  Assist level: Contact Guard/Touching assist Assistive device: Walker-rolling    Walk 150 feet activity   Assist Walk 150 feet activity did not occur: Safety/medical concerns  Assist level: Contact Guard/Touching assist Assistive device: Walker-rolling    Walk 10 feet on uneven surface  activity   Assist Walk 10 feet on uneven surfaces activity did not occur: Safety/medical concerns         Wheelchair     Assist Will patient use wheelchair at discharge?:  (TBD)   Wheelchair  activity did not occur: N/A         Wheelchair 50 feet with 2 turns activity    Assist    Wheelchair 50 feet with 2 turns activity did not occur: N/A       Wheelchair 150 feet activity     Assist  Wheelchair 150 feet activity did not occur: N/A       Blood pressure 106/60, pulse 83, temperature 98 F (36.7 C), temperature source Oral, resp. rate 19, height 5\' 3"  (1.6 m), weight 90 kg, SpO2 98 %.  Medical Problem List and Plan: 1.  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  Antithrombotics: -DVT/anticoagulation: Lovenox             -antiplatelet therapy: Aspirin 81 mg daily and Plavix 75 mg daily x3 weeks and aspirin alone 3. Pain Management: N/A 4. Mood: Provide emotional support             -antipsychotic agents: N/A 5. Neuropsych: This patient is capable of making decisions on her own behalf. 6. Skin/Wound Care: Routine skin checks 7. Fluids/Electrolytes/Nutrition: po fluids low, will restart IVF at noc  8.  New findings diabetes mellitus.  Hemoglobin A1c 7.4.  d/c glucophage given side effects. Already received today, start glipizide 2.5mg  tomorrow. Discussed minimizing added sugar and bread/pasta/rice- she is already doing. Diabetic teaching. CBGs improved. Provided list of foods good for diabetes.  CBG (last 3)  Recent Labs    03/18/21 1633 03/18/21 2111 03/19/21 0600  GLUCAP 98 89 108*   Controlled 7/24 on glipizide 2.5mg  daily   7/17- Bgs look great- con't regimen 9.  Hypertension.  Well controlled.  Vitals:   03/18/21 1915 03/19/21 0521  BP: 109/67 106/60  Pulse: 80 83  Resp: 17 19  Temp: 98.1 F (36.7 C) 98 F (36.7 C)  SpO2: 100% 98%   Controlled 7/24, now is  off lisinopril 2.5 mg daily  but will monitor for increased BPs 10.  Hyperlipidemia.  Lipitor 11.  Thyroid nodule.  Follow-up outpatient 12. AKI: encouraged 6-8  glasses of water per day. Stop metformin. Decrease lisinopril to 2.5mg . Monitor creatinine.  Resolved d/c iv and recheck bmet next week     LOS: 18 days A FACE TO FACE EVALUATION WAS PERFORMED  8/24 03/19/2021, 8:53 AM

## 2021-03-20 LAB — BASIC METABOLIC PANEL
Anion gap: 11 (ref 5–15)
BUN: 25 mg/dL — ABNORMAL HIGH (ref 8–23)
CO2: 21 mmol/L — ABNORMAL LOW (ref 22–32)
Calcium: 10.2 mg/dL (ref 8.9–10.3)
Chloride: 108 mmol/L (ref 98–111)
Creatinine, Ser: 1.03 mg/dL — ABNORMAL HIGH (ref 0.44–1.00)
GFR, Estimated: 57 mL/min — ABNORMAL LOW (ref >60)
Glucose, Bld: 107 mg/dL — ABNORMAL HIGH (ref 70–99)
Potassium: 4.7 mmol/L (ref 3.5–5.1)
Sodium: 140 mmol/L (ref 135–145)

## 2021-03-20 LAB — GLUCOSE, CAPILLARY
Glucose-Capillary: 113 mg/dL — ABNORMAL HIGH (ref 70–99)
Glucose-Capillary: 65 mg/dL — ABNORMAL LOW (ref 70–99)
Glucose-Capillary: 162 mg/dL — ABNORMAL HIGH (ref 70–99)
Glucose-Capillary: 65 mg/dL — ABNORMAL LOW (ref 70–99)
Glucose-Capillary: 134 mg/dL — ABNORMAL HIGH (ref 70–99)

## 2021-03-20 NOTE — Progress Notes (Signed)
PROGRESS NOTE   Subjective/Complaints: No complaints this morning Did great with therapy! Asks if she needs her diabetes medications. CBGs 75-125: d/ced glypizide 2.5mg  and provided dietary education  ROS:  Pt denies SOB, abd pain, CP, N/V/C/D, and vision changes  Objective:   No results found. No results for input(s): WBC, HGB, HCT, PLT in the last 72 hours.  Recent Labs    03/20/21 0513  NA 140  K 4.7  CL 108  CO2 21*  GLUCOSE 107*  BUN 25*  CREATININE 1.03*  CALCIUM 10.2    Intake/Output Summary (Last 24 hours) at 03/20/2021 1338 Last data filed at 03/19/2021 2100 Gross per 24 hour  Intake 360 ml  Output --  Net 360 ml        Physical Exam: Vital Signs Blood pressure 119/62, pulse 83, temperature 97.9 F (36.6 C), temperature source Oral, resp. rate 16, height 5\' 3"  (1.6 m), weight 90 kg, SpO2 100 %. Gen: no distress, normal appearing HEENT: oral mucosa pink and moist, NCAT Cardio: Reg rate Chest: normal effort, normal rate of breathing Abd: soft, non-distended Ext: no edema Psych: pleasant, normal affect Skin: intact  Neurologic: Cranial nerves II through XII intact, motor strength is 5/5 in left deltoid, bicep, tricep, grip, hip flexor, knee extensors, ankle dorsiflexor and plantar flexor 3-/5 RUE bi, tri, 1/5 finger and wrist , 2- R thumb flexion and 30 R hip /knee ext synergy, 0/5 distal RLE--stable exam Sensory exam normal sensation to light touch and proprioception in bilateral upper and lower extremities Neg mystagmus  Musculoskeletal: normal ROM, no pain    Assessment/Plan: 1. Functional deficits which require 3+ hours per day of interdisciplinary therapy in a comprehensive inpatient rehab setting. Physiatrist is providing close team supervision and 24 hour management of active medical problems listed below. Physiatrist and rehab team continue to assess barriers to discharge/monitor  patient progress toward functional and medical goals  Care Tool:  Bathing    Body parts bathed by patient: Right arm, Chest, Abdomen, Right upper leg, Left upper leg, Face, Buttocks, Front perineal area, Left lower leg   Body parts bathed by helper: Left arm, Right lower leg Body parts n/a: Left arm, Right lower leg   Bathing assist Assist Level: Minimal Assistance - Patient > 75%     Upper Body Dressing/Undressing Upper body dressing   What is the patient wearing?: Pull over shirt, Bra    Upper body assist Assist Level: Moderate Assistance - Patient 50 - 74%    Lower Body Dressing/Undressing Lower body dressing      What is the patient wearing?: Underwear/pull up, Pants     Lower body assist Assist for lower body dressing: Minimal Assistance - Patient > 75%     Toileting Toileting    Toileting assist Assist for toileting: Minimal Assistance - Patient > 75%     Transfers Chair/bed transfer  Transfers assist     Chair/bed transfer assist level: Contact Guard/Touching assist     Locomotion Ambulation   Ambulation assist      Assist level: Contact Guard/Touching assist Assistive device: Walker-rolling Max distance: 92   Walk 10 feet activity   Assist  Assist level: Contact Guard/Touching assist Assistive device: Walker-rolling   Walk 50 feet activity   Assist Walk 50 feet with 2 turns activity did not occur: Safety/medical concerns  Assist level: Contact Guard/Touching assist Assistive device: Walker-rolling    Walk 150 feet activity   Assist Walk 150 feet activity did not occur: Safety/medical concerns  Assist level: Contact Guard/Touching assist Assistive device: Walker-rolling    Walk 10 feet on uneven surface  activity   Assist Walk 10 feet on uneven surfaces activity did not occur: Safety/medical concerns         Wheelchair     Assist Will patient use wheelchair at discharge?:  (TBD)   Wheelchair activity did  not occur: N/A         Wheelchair 50 feet with 2 turns activity    Assist    Wheelchair 50 feet with 2 turns activity did not occur: N/A       Wheelchair 150 feet activity     Assist  Wheelchair 150 feet activity did not occur: N/A       Blood pressure 119/62, pulse 83, temperature 97.9 F (36.6 C), temperature source Oral, resp. rate 16, height 5\' 3"  (1.6 m), weight 90 kg, SpO2 100 %.  Medical Problem List and Plan: 1.  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  Impaired mobility -DVT/anticoagulation: Lovenox             -antiplatelet therapy: Continue Aspirin 81 mg daily and Plavix 75 mg daily x3 weeks and aspirin alone 3. Pain Management: N/A 4. Mood: Provide emotional support             -antipsychotic agents: N/A 5. Neuropsych: This patient is capable of making decisions on her own behalf. 6. Skin/Wound Care: Routine skin checks 7. Fluids/Electrolytes/Nutrition: po fluids low, will restart IVF at noc  8.  New findings diabetes mellitus.  Hemoglobin A1c 7.4.  d/c glypizide given good CBG control/hypoglycemic to 65 before lunch. Discussed minimizing added sugar and bread/pasta/rice- she is already doing. Diabetic teaching. CBGs improved. Provided list of foods good for diabetes.  CBG (last 3)  Recent Labs    03/19/21 2101 03/20/21 0608 03/20/21 1156  GLUCAP 106* 113* 65*   9.  Hypertension.  Well controlled.  Vitals:   03/19/21 1942 03/20/21 0415  BP: 134/73 119/62  Pulse: 74 83  Resp: 18 16  Temp: 98.7 F (37.1 C) 97.9 F (36.6 C)  SpO2: 100% 100%   Controlled 7/25, now is  off lisinopril 2.5 mg daily  but will monitor for increased BPs 10.  Hyperlipidemia.  Continue Lipitor 11.  Thyroid nodule.  Follow-up outpatient 12. AKI: encouraged 6-8 glasses of water per day. Stop metformin. Decrease lisinopril to 2.5mg . Monitor  creatinine.  Resolved d/c iv and recheck bmet next week     LOS: 19 days A FACE TO FACE EVALUATION WAS PERFORMED  8/25 P Shalie Schremp 03/20/2021, 1:38 PM

## 2021-03-20 NOTE — Progress Notes (Signed)
Physical Therapy Session Note  Patient Details  Name: Carrie Kline MRN: [id number] Date of Birth: 1946-06-19  Today's Date: 03/20/2021 PT Individual Time: 1030-1130 PT Individual Time Calculation (min): 60 min   Short Term Goals: Week 1:  PT Short Term Goal 1 (Week 1): Pt will complete bed mobility with MinA PT Short Term Goal 1 - Progress (Week 1): Progressing toward goal PT Short Term Goal 2 (Week 1): Pt will ambulate at least 32f with ModA PT Short Term Goal 2 - Progress (Week 1): Met PT Short Term Goal 3 (Week 1): Pt will transfer bed to chair with MinA using squat pivot consistently PT Short Term Goal 3 - Progress (Week 1): Met PT Short Term Goal 4 (Week 1): Pt will got up 4 steps using handrails and ModA PT Short Term Goal 4 - Progress (Week 1): Progressing toward goal Week 2:  PT Short Term Goal 1 (Week 2): Pt will perform supine<>sit with min assist PT Short Term Goal 1 - Progress (Week 2): Met PT Short Term Goal 2 (Week 2): Pt will perform sit<>stands using LRAD with CGA PT Short Term Goal 2 - Progress (Week 2): Met PT Short Term Goal 3 (Week 2): Pt will perform bed<>chair transfers via stand pivot using LRAD with min assist PT Short Term Goal 3 - Progress (Week 2): Met PT Short Term Goal 4 (Week 2): Pt will ambulate at least 563fusing LRAD with min assist of 1 PT Short Term Goal 4 - Progress (Week 2): Met PT Short Term Goal 5 (Week 2): Pt will initiate stair navigation training PT Short Term Goal 5 - Progress (Week 2): Met  Skilled Therapeutic Interventions/Progress Updates:    Pt seated in w/c on arrival and agreeable to therapy. No complaint of pain. Pt with shoes donned and R AFO in place. Pt transported to therapy gym for time mgmt. Pt navigated 6" stairs 3 x 4 with BUE support with CGA. Pt was unable to lift R foot onto step. Gait x 20 ft to // bars with RW CGA. Pt performed lunge into knee drive [id number] emphasis on R hip extension/upright posture. Pt required  frequent cueing to maintain upright posture. Discussed coping mechanisms and importance of mental outlook  during rest breaks. Pt walked 250 ft with RW and CGA. Pt demoed circumduction gait with decr hip/knee flexion. Vc's and tapping facilitation for increased hip and knee flexion. Pt returned to room and remained in w/c with belt alarm active and all needs in reach.   Therapy Documentation Precautions:  Precautions Precautions: Fall Precaution Comments: R hemiparesis Restrictions Weight Bearing Restrictions: No     Therapy/Group: Individual Therapy  OlMickel Fuchs/25/2022, 4:52 PM

## 2021-03-20 NOTE — Progress Notes (Addendum)
Occupational Therapy Session Note  Patient Details  Name: Carrie Kline MRN: [id number] Date of Birth: 02-09-1946  Today's Date: 03/20/2021 OT Individual Time: 0901-1004 OT Individual Time Calculation (min): 63 min    Short Term Goals: Week 3:  OT Short Term Goal 1 (Week 3): Continue working on established LTGs at min assist overall.  Skilled Therapeutic Interventions/Progress Updates:    Session 1: [phone])  Pt worked on toileting, bathing, and dressing sit to stand.  She was able to ambulate to the 3:1 over the toilet with min assist to begin.  She was able to complete toileting tasks at min assist level as well and removed her clothing for transfer over to the shower at min assist level.  She was able to complete all bathing at min assist level including mod assist for integration of the RUE to wash the left arm and for washing part of both LEs.  She was able to stand and wash her front and back peri area with min guard holding the grab bar with the RUE.  Once she completed drying off, she transferred to the wheelchair at min assist level for dressing and grooming tasks at the sink.  She was able to complete UB dressing and LB dressing with min assist for underpants, pants, and pullover shirt.  Max assist was needed for donning her socks, shoes, and AFO on the right foot.  She finished session with completion or oral hygiene in sitting with setup.  Call button and phone in reach with safety belt in place at end of session.    Session 2: ([id number])  Pt up in wheelchair to start session.  She was taken down to the tub room where she practiced tub/shower transfers with use of the RW for support.  She was able to complete with min assist and then transitioned to the ortho gym for use of the UE ergonometer.  Right hand was ace bandaged to the handle where she completed the first set with resistance set on level 1 and RPMs maintained at 20 for 3 mins.  She was then able to complete 2 sets of 2 mins  using the isolated RUE with mod facilitation at slower than 5 RPMs.  Concentration focused on revolutions while avoiding trunk compensation.  Next, had her transition to a therapy mat where she focused on functional reach with the RUE to pick up small foam blocks and place in container.  Blocks and container positioned at knee level to help decrease shoulder hike and promote elbow extension when reaching.  Finished session with return to the wheelchair with min assist and then to the recliner at the same level where she was left with the call button and phone in reach and chair alarm in place.  NT in for vitals check as well.   Therapy Documentation Precautions:  Precautions Precautions: Fall Precaution Comments: R hemiparesis Restrictions Weight Bearing Restrictions: No   Pain: Pain Assessment Pain Scale: Faces Pain Score: 0-No pain ADL: See Care Tool Section for some details of mobility and selfcare   Therapy/Group: Individual Therapy  Karilyn Wind OTR/L 03/20/2021, 12:27 PM

## 2021-03-21 LAB — GLUCOSE, CAPILLARY
Glucose-Capillary: 118 mg/dL — ABNORMAL HIGH (ref 70–99)
Glucose-Capillary: 117 mg/dL — ABNORMAL HIGH (ref 70–99)
Glucose-Capillary: 98 mg/dL (ref 70–99)
Glucose-Capillary: 140 mg/dL — ABNORMAL HIGH (ref 70–99)

## 2021-03-21 NOTE — Plan of Care (Signed)
  Problem: RH Simple Meal Prep Goal: LTG Patient will perform simple meal prep w/assist (OT) Description: LTG: Patient will perform simple meal prep with assistance, with/without cues (OT). Outcome: Not Applicable Flowsheets (Taken 03/21/2021 1722) LTG: Pt will perform simple meal prep with assistance level of: (Goal discharged) -- Note: Goal discharged

## 2021-03-21 NOTE — Plan of Care (Signed)
  Problem: Consults Goal: RH STROKE PATIENT EDUCATION Description: See Patient Education module for education specifics  Outcome: Progressing   Problem: RH BOWEL ELIMINATION Goal: RH STG MANAGE BOWEL WITH ASSISTANCE Description: STG Manage Bowel with mod I Assistance. Outcome: Progressing Goal: RH STG MANAGE BOWEL W/MEDICATION W/ASSISTANCE Description: STG Manage Bowel with Medication with  mod I Assistance. Outcome: Progressing   Problem: RH SAFETY Goal: RH STG ADHERE TO SAFETY PRECAUTIONS W/ASSISTANCE/DEVICE Description: STG Adhere to Safety Precautions With cues/reminders  Assistance/Device. Outcome: Progressing   Problem: RH PAIN MANAGEMENT Goal: RH STG PAIN MANAGED AT OR BELOW PT'S PAIN GOAL Description: At or below level 4 Outcome: Progressing   Problem: RH KNOWLEDGE DEFICIT Goal: RH STG INCREASE KNOWLEDGE OF DIABETES Description: Patient and daughter will be able to manage DM with medications and dietary modifications using handouts and handbooks independently Outcome: Progressing Goal: RH STG INCREASE KNOWLEDGE OF HYPERTENSION Description: Patient and daughter will be able to manage HTN with medications and dietary modifications using handouts and handbooks independently Outcome: Progressing Goal: RH STG INCREASE KNOWLEGDE OF HYPERLIPIDEMIA Description: Patient and daughter will be able to manage HLD with medications and dietary modifications using handouts and handbooks independently Outcome: Progressing Goal: RH STG INCREASE KNOWLEDGE OF STROKE PROPHYLAXIS Description: Patient and daughter will be able to manage secondary stroke risks with medications and dietary modifications using handouts and handbooks independently Outcome: Progressing   

## 2021-03-21 NOTE — Progress Notes (Signed)
Patient ID: Carrie Kline, female   DOB: [birth year], 75 y.o.   MRN: [id number]  [facility] list provided to patient and daughter. Daughter requesting handicap application.  [place], [place] [phone]

## 2021-03-21 NOTE — Progress Notes (Signed)
Patient ID: Carrie Kline, female   DOB: March 05, 1946, 75 y.o.   MRN: [id number]  Transfer Bench and Bedside Commode ordered through [employer]   [place], [place] [phone]

## 2021-03-21 NOTE — Progress Notes (Signed)
Patient ID: Carrie Kline, female   DOB: 1946-05-19, 75 y.o.   MRN: [id number]  New Patient Visit with Rema Fendt on Thursday May 11, 2021 at 8:30 A.M.  Primary Care at [facility] [address] [place] [address] [phone]  [place], [place] [phone]

## 2021-03-21 NOTE — Progress Notes (Signed)
PROGRESS NOTE   Subjective/Complaints:  Patient feels like she is doing well in therapy.  Feels like her arm and leg are getting stronger.  She continues to try to drink as much as possible.  We discussed diabetic as well as blood pressure management ROS:  Pt denies SOB, abd pain, CP, N/V/C/D, and vision changes  Objective:   No results found. No results for input(s): WBC, HGB, HCT, PLT in the last 72 hours.  Recent Labs    03/20/21 0513  NA 140  K 4.7  CL 108  CO2 21*  GLUCOSE 107*  BUN 25*  CREATININE 1.03*  CALCIUM 10.2     Intake/Output Summary (Last 24 hours) at 03/21/2021 0904 Last data filed at 03/21/2021 0834 Gross per 24 hour  Intake 360 ml  Output --  Net 360 ml         Physical Exam: Vital Signs Blood pressure (!) 106/56, pulse 81, temperature 98.6 F (37 C), temperature source Oral, resp. rate 18, height 5\' 3"  (1.6 m), weight 90 kg, SpO2 99 %.  General: No acute distress Mood and affect are appropriate Heart: Regular rate and rhythm no rubs murmurs or extra sounds Lungs: Clear to auscultation, breathing unlabored, no rales or wheezes Abdomen: Positive bowel sounds, soft nontender to palpation, nondistended Extremities: No clubbing, cyanosis, or edema Skin: No evidence of breakdown, no evidence of rash Neurologic: Cranial nerves II through XII intact, motor strength is 5/5 in left deltoid, bicep, tricep, grip, hip flexor, knee extensors, ankle dorsiflexor and plantar flexor 3-/5 RUE bi, tri, 2/5 finger and wrist , 3- R thumb flexion and 3/5 R hip /knee ext synergy, 0/5 distal RLE--stable exam Sensory exam normal sensation to light touch and proprioception in bilateral upper and lower extremities Neg mystagmus  Musculoskeletal: normal ROM, no pain    Assessment/Plan: 1. Functional deficits which require 3+ hours per day of interdisciplinary therapy in a comprehensive inpatient rehab  setting. Physiatrist is providing close team supervision and 24 hour management of active medical problems listed below. Physiatrist and rehab team continue to assess barriers to discharge/monitor patient progress toward functional and medical goals  Care Tool:  Bathing    Body parts bathed by patient: Right arm, Chest, Abdomen, Right upper leg, Left upper leg, Face, Buttocks, Front perineal area, Left lower leg   Body parts bathed by helper: Left arm, Right lower leg Body parts n/a: Left arm, Right lower leg   Bathing assist Assist Level: Minimal Assistance - Patient > 75%     Upper Body Dressing/Undressing Upper body dressing   What is the patient wearing?: Pull over shirt, Bra    Upper body assist Assist Level: Moderate Assistance - Patient 50 - 74%    Lower Body Dressing/Undressing Lower body dressing      What is the patient wearing?: Underwear/pull up, Pants     Lower body assist Assist for lower body dressing: Minimal Assistance - Patient > 75%     Toileting Toileting    Toileting assist Assist for toileting: Minimal Assistance - Patient > 75%     Transfers Chair/bed transfer  Transfers assist     Chair/bed transfer assist level: Contact  Guard/Touching assist     Locomotion Ambulation   Ambulation assist      Assist level: Contact Guard/Touching assist Assistive device: Walker-rolling Max distance: 92   Walk 10 feet activity   Assist     Assist level: Contact Guard/Touching assist Assistive device: Walker-rolling   Walk 50 feet activity   Assist Walk 50 feet with 2 turns activity did not occur: Safety/medical concerns  Assist level: Contact Guard/Touching assist Assistive device: Walker-rolling    Walk 150 feet activity   Assist Walk 150 feet activity did not occur: Safety/medical concerns  Assist level: Contact Guard/Touching assist Assistive device: Walker-rolling    Walk 10 feet on uneven surface  activity   Assist  Walk 10 feet on uneven surfaces activity did not occur: Safety/medical concerns         Wheelchair     Assist Will patient use wheelchair at discharge?:  (TBD)   Wheelchair activity did not occur: N/A         Wheelchair 50 feet with 2 turns activity    Assist    Wheelchair 50 feet with 2 turns activity did not occur: N/A       Wheelchair 150 feet activity     Assist  Wheelchair 150 feet activity did not occur: N/A       Blood pressure (!) 106/56, pulse 81, temperature 98.6 F (37 C), temperature source Oral, resp. rate 18, height 5\' 3"  (1.6 m), weight 90 kg, SpO2 99 %.  Medical Problem List and Plan: 1.  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  Impaired mobility -DVT/anticoagulation: Lovenox             -antiplatelet therapy: Continue Aspirin 81 mg daily and Plavix 75 mg daily x3 weeks and aspirin alone D/C plavix today  3. Pain Management: N/A 4. Mood: Provide emotional support             -antipsychotic agents: N/A 5. Neuropsych: This patient is capable of making decisions on her own behalf. 6. Skin/Wound Care: Routine skin checks 7. Fluids/Electrolytes/Nutrition: po fluids low, will restart IVF at noc  8.  New findings diabetes mellitus.  Hemoglobin A1c 7.4.  d/c glypizide given good CBG control/hypoglycemic to 65 before lunch. Discussed minimizing added sugar and bread/pasta/rice- she is already doing. Diabetic teaching. CBGs improved. Provided list of foods good for diabetes.  CBG (last 3)  Recent Labs    03/20/21 1656 03/20/21 2153 03/21/21 0643  GLUCAP 65* 134* 118*   Now off glipizide may take several days for hypoglycemia to improve  9.  Hypertension.  Well controlled.  Vitals:   03/20/21 2005 03/21/21 0443  BP: (!) 117/56 (!) 106/56  Pulse: 87 81  Resp: 18 18  Temp: 98.3 F (36.8 C) 98.6 F (37 C)  SpO2: 100%  99%   Controlled 7/26 off lisinopril 10.  Hyperlipidemia.  Continue Lipitor 11.  Thyroid nodule.  Follow-up outpatient 12. AKI: encouraged 6-8 glasses of water per day. Stop metformin. Decrease lisinopril to 2.5mg . Monitor creatinine.  Resolved d/c iv and recheck bmet ok    LOS: 20 days A FACE TO FACE EVALUATION WAS PERFORMED  8/26 03/21/2021, 9:04 AM

## 2021-03-21 NOTE — Progress Notes (Signed)
Occupational Therapy Session Note  Patient Details  Name: Carrie Kline MRN: [id number] Date of Birth: 09-17-1945  Today's Date: 03/21/2021 OT Individual Time: 1104-1200 OT Individual Time Calculation (min): 56 min    Short Term Goals: Week 3:  OT Short Term Goal 1 (Week 3): Continue working on established LTGs at min assist overall.  Skilled Therapeutic Interventions/Progress Updates:    Pt worked on shower, toileting, and dressing during session.  She needed mod assist for ambulation to the shower without AFO and without an assistive device.  Once in the shower, she was able to remove all clothing with min assist and then complete shower at the same level.  Wash mitt was issued to assist with incorporating the RUE to wash with mod assist overall.  She continues to need min to mod assist for crossing the RLE over the LLE and maintaining for bathing, applying lotion, and for dressing tasks.  She worked on dressing sit to stand at the sink with max assist for donning front fastening bra and supervision for donning pullover shirt.  She was able to donn her underpants and pants at min assist as well with min assist for gripper socks.  Min assist to complete toilet transfer as well as toileting tasks with transfer back to the wheelchair.  She was able to complete oral hygiene at supervision level as well.  Finished session with pt in the wheelchair and with the call button and phone in reach and safety belt in place.    Therapy Documentation Precautions:  Precautions Precautions: Fall Precaution Comments: R hemiparesis Restrictions Weight Bearing Restrictions: No  Pain: Pain Assessment Pain Scale: Faces Pain Score: 0-No pain ADL: See Care Tool Section for some details of mobility and selfcare   Therapy/Group: Individual Therapy  Talesha Ellithorpe OTR/L 03/21/2021, 12:49 PM

## 2021-03-21 NOTE — Progress Notes (Signed)
Physical Therapy Session Note  Patient Details  Name: Carrie Kline MRN: [id number] Date of Birth: 10-15-1945  Today's Date: 03/21/2021 PT Individual Time: 0805-0905 PT Individual Time Calculation (min): 60 min   Short Term Goals: Week 1:  PT Short Term Goal 1 (Week 1): Pt will complete bed mobility with MinA PT Short Term Goal 1 - Progress (Week 1): Progressing toward goal PT Short Term Goal 2 (Week 1): Pt will ambulate at least 64f with ModA PT Short Term Goal 2 - Progress (Week 1): Met PT Short Term Goal 3 (Week 1): Pt will transfer bed to chair with MinA using squat pivot consistently PT Short Term Goal 3 - Progress (Week 1): Met PT Short Term Goal 4 (Week 1): Pt will got up 4 steps using handrails and ModA PT Short Term Goal 4 - Progress (Week 1): Progressing toward goal Week 2:  PT Short Term Goal 1 (Week 2): Pt will perform supine<>sit with min assist PT Short Term Goal 1 - Progress (Week 2): Met PT Short Term Goal 2 (Week 2): Pt will perform sit<>stands using LRAD with CGA PT Short Term Goal 2 - Progress (Week 2): Met PT Short Term Goal 3 (Week 2): Pt will perform bed<>chair transfers via stand pivot using LRAD with min assist PT Short Term Goal 3 - Progress (Week 2): Met PT Short Term Goal 4 (Week 2): Pt will ambulate at least 568fusing LRAD with min assist of 1 PT Short Term Goal 4 - Progress (Week 2): Met PT Short Term Goal 5 (Week 2): Pt will initiate stair navigation training PT Short Term Goal 5 - Progress (Week 2): Met Week 3:  PT Short Term Goal 1 (Week 3): STG = LTG due to ELOS  Skilled Therapeutic Interventions/Progress Updates:  Patient supine in bed on entrance to room. Patient alert and agreeable to PT session. Patient denied pain during session.  Therapeutic Activity: Bed Mobility: Patient performed supine --> sit with supervision once covers removed. Seated position on EOB maintained with supervision while pt doffed gown with supervision and donnd new shirt  with Min A and pants with Min A. Shoes and AFO donned with Max A for time.  Transfers: Patient performed STS and SPVT transfers throughout session with no AD and supervision. Provided verbal cues for increasing R step height throughout pivot stepping.  Gait Training:  Patient ambulated >200 feet from room to main therapy gym using RW with light CGA. Demonstrated intermittent, brief catch of toe during RLE swing through. Provided vc/ tc for increasing step height and following through with heel to toe progression on LLE. Pt demonstrating significant [facility]  Neuromuscular Re-ed: NMR facilitated during session with focus on seated and standing balance as well as RLE motor control and muscle facilitation. Pt guided in PASS outcome measure - see below for results. Pt also guided in toe touches with R foot to 6" step. Pt requires light Min A to clear final inch of height and improves over 20 reps to coming within 1/2" of clearing step as well as ability to pick up toe from step and return foot to floor without scraping foot on step. Pt also able to perform with LLE and maintain R knee extension throughout performance. Pt uses LUE HHA.  NMR performed for improvements in motor control and coordination, balance, sequencing, judgement, and self confidence/ efficacy in performing all aspects of mobility at highest level of independence.   Pt has questions re: expectations for  continued progress after d/c home as well as a few other questions re: logistics of discharge. Most questions answered and other questions referred to nsg and/ or SW. Pt is also emotional about upcoming discharge and conflicted with sincere appreciation for all who have helped her progress to current abilities/ level and desire to continue to progress. This PT assured pt that her progress is due to her hard work and dedication to progress herself. As long as these continue on return home, she will continue to  advocate for herself and continue to progress. Pt appreciative that each therapist has given her the time to share her feelings and truly listened.   Patient seated upright  in w/c at end of session with brakes locked, belt alarm set, provided with ice water, and all needs within reach.  Postural Assessment Scale for Stroke  Maintaining a posture  (__3_)Item 1: Sitting without support (sitting on the edge of a 50cm-high examination table with feet touching the floor)  0 = cannot sit 1 = can sit with slight support (e.g. by 1 hand) 2 = can sit for more than 10 seconds without support 3 = can sit for 5 minutes without support  (_3__)Item 2: Standing with support (feet position free, no other constraints)  0 = cannot stand, even with support 1 = can stand with strong support of 2 people 2 = can stand with moderate support of 1 person 3 = can stand with support on only 1 hand  (_2__)Item 3: Standing without support (feet position free, no other constraints)  0 = cannot stand without support 1 = can stand without support for 10 seconds or leans heavily on 1 leg 2 = can stand without support for 1 minute or stands slightly asymmetrically 3 = can stand without support for more than 1 minute and at the same time perform arm movements above the shoulder level  (_1__)Item 4: Standing on the nonparetic leg (no other constraints)  0 = cannot stand on the leg 1 = can stand on the leg for a few seconds 2 = can stand on the leg for more than 5 seconds 3 = can stand on the leg for more than 10 seconds  (_1__)Item 5: Standing on the paretic leg (no other restraints)  0 = cannot stand on the leg 1 = can stand on the leg for a few seconds 2 = can stand on the leg for more than 5 seconds 3 = can stand on the leg for more than 10 seconds  Changing Posture  (_3__)Item 6: Supine to affected side lateral  0 = cannot perform the activity 1 = can perform the activity with much help 2 = can  perform the activity with little help 3 = can perform the activity without help  (_3__)Item 7: Supine to nonaffected side lateral  0 = cannot perform the activity 1 = can perform the activity with much help 2 = can perform the activity with little help 3 = can perform the activity without help  (_3__)Item 8: Supine to sitting up on edge of table  0 = cannot perform the activity 1 = can perform the activity with much help 2 = can perform the activity with little help 3 = can perform the activity without help  (_3__)Item 9: Sitting on edge of table to supine  0 = cannot perform the activity 1 = can perform the activity with much help 2 = can perform the activity with little help 3 =  can perform the activity without help  (_3__)Item 10: Sit-to-stand (without any support, no other constraints)  0 = cannot perform the activity 1 = can perform the activity with much help 2 = can perform the activity with little help 3 = can perform the activity without help  (_3__)Item 11: Stand-to-sit (without any support, no other constraints)  0 = cannot perform the activity 1 = can perform the activity with much help 2 = can perform the activity with little help 3 = can perform the activity without help  (_2__)Item 12: Standing, picking up a pencil from the floor (without any support, no other constraints)  0 = cannot perform the activity 1 = can perform the activity with much help 2 = can perform the activity with little help 3 = can perform the activity without help  Total: 30/ 36     Therapy Documentation Precautions:  Precautions Precautions: Fall Precaution Comments: R hemiparesis Restrictions Weight Bearing Restrictions: No  Therapy/Group: Individual Therapy  Alger Simons PT, DPT 03/21/2021, 12:48 PM

## 2021-03-21 NOTE — Progress Notes (Signed)
Physical Therapy Session Note  Patient Details  Name: Carrie Kline MRN: [id number] Date of Birth: 07-07-1946  Today's Date: 03/21/2021 PT Individual Time: 0175-1025 PT Individual Time Calculation (min): 72 min   Short Term Goals: Week 3:  PT Short Term Goal 1 (Week 3): STG = LTG due to ELOS  Skilled Therapeutic Interventions/Progress Updates:    Pt received sitting in w/c and agreeable to therapy session. Donned B LE shoes and R LE AFO max assist for time management. Sit>stand w/c>RW with CGA for steadying - cuing to push up with R UE from w/c armrest to promote NMR and cuing for proper R foot positioning to promote increased WBing.   Gait training ~248ft total to main gym starting with RW and min assist progressed to [facility] via +2 assist, then R HHA with close +2 assist for safety:  - when using RW pt demos compensation for impaired R stance control by increasing L UE support/WBing on RW resulting in L lateral trunk lean and compensatory R LE circumduction during swing - progressed to B HHA to control amount of support provided through L UE to promote improved R/L weight shifting onto stance limb; continued to notice pt has R pelvis posteriorly shifted/rotated compared to L likely contributing to R LE circumduction during swing - advanced to only R HHA with pt demonstrating increased R weight shift during stance and increased use of R UE support with therapist facilitating improved R hip extension and anterior pelvic translation over stance limb  R LE NMR targeting improved stance control via L LE foot taps on/off 4" step, no UE support, with mirror feedback focusing on increased R hip extension and R weight shift while avoiding L lateral trunk lean and R hip "popping" out with poor hip abductor activation - min assist for balance and cuing for improved alignment.  Gait to/from // bars no AD with min assist for balance - cuing for carryover of R LE NMR with minor improvements noted.    In //  bars using B UE support performed the following R LE NMR tasks? - R LE hamstring curls with manual facilitation to increased ROM, cuing to activate hamstrings instead of co-contracting with quads - R foot taps on/off 6" step with manual facilitation for increased hip/knee flexion  Supine<>sit on mat table with min assist for trunk upright. Supine bridging 2x20 reps with R LE bias targeting increased glute activation for hip stability during stance - cuing for increased R hip clearance and improved control over the movement. Supine B LE hip flexor stretch 2x1 minute each.   Standing R LE NMR, no UE support, via pre-gait forwards/backwards stepping with L LE targeting R stance control - min assist for balance. Gait training ~181ft back towards room, no AD, with +2 close assist for safety progressed to providing L HHA - min assist for balance from therapist with 1x light mod assist due to minor anterior LOB - facilitating at hips for R/L weight shift onto stance limb and improved R stance control.   Discussed D/C recommendations of follow-up OPPT. Discussed DME recommendations with plan to include family in this conversation tomorrow.  Transported remainder of distance back to room and pt agreeable to remain sitting up - left with needs in reach, seat belt alarm on, and R UE supported on 1/2 lap tray.     Therapy Documentation Precautions:  Precautions Precautions: Fall Precaution Comments: R hemiparesis Restrictions Weight Bearing Restrictions: No   Pain:  Denies pain during session. Intermittently  has some "pulling" or slight discomfort with certain tasks but able to modify/adjust interventions to alleviate discomfort.   Therapy/Group: Individual Therapy  Ginny Forth , PT, DPT, NCS, CSRS 03/21/2021, 3:28 PM

## 2021-03-22 LAB — GLUCOSE, CAPILLARY
Glucose-Capillary: 122 mg/dL — ABNORMAL HIGH (ref 70–99)
Glucose-Capillary: 143 mg/dL — ABNORMAL HIGH (ref 70–99)
Glucose-Capillary: 127 mg/dL — ABNORMAL HIGH (ref 70–99)
Glucose-Capillary: 153 mg/dL — ABNORMAL HIGH (ref 70–99)

## 2021-03-22 NOTE — Patient Care Conference (Signed)
Inpatient RehabilitationTeam Conference and Plan of Care Update Date: 03/22/2021   Time: 10:49 AM    Patient Name: Carrie Kline      Medical Record Number: [id number]  Date of Birth: 1945/10/04 Sex: Female         Room/Bed: 4W01C/4W01C-01 Payor Info: Payor: [employer]ICARE / Plan: BCBS MEDICARE / Product Type: *No Product type* /    Admit Date/Time:  03/01/2021  2:15 PM  Primary Diagnosis:  Brainstem infarct, acute [facility])  Hospital Problems: Principal Problem:   Brainstem infarct, acute [facility])    Expected Discharge Date: Expected Discharge Date: 03/24/21  Team Members Present: Physician leading conference: Dr. Claudette Laws Social Worker Present: Lavera Guise, BSW Nurse Present: Chana Bode, RN PT Present: Casimiro Needle, PT OT Present: Perrin Maltese, OT PPS Coordinator present : Fae Pippin, SLP     Current Status/Progress Goal Weekly Team Focus  Bowel/Bladder   Continent Bowel/Bladder LBM 7/26  Pt will remain continent of bowel and bladder  Assess q shift and PRN   Swallow/Nutrition/ Hydration             ADL's   Min assist for UB and LB selfcare with min assist for transfers using the RW for support.  Still with RUE weakness at [facility] stage IV level in the arm and hand.  contact guard to min assist  neuromuscular re-education, balance retraining, transfer training, DME education, pt education, therapeutic exercises, NMES   Mobility   supervision bed mobility, CGA sit<>stand using RW, min assist stand pivot transfers, min assist gait up to 252ft using RW with R LE AFO, CGA/min assist 4 steps using BHRs  CGA overall at ambulatory level  R LE NMR, transfer training, gait training, pt education, dynamic standing balance, AFO education, discharge planning, stair navigation, activity tolerance   Communication             Safety/Cognition/ Behavioral Observations            Pain   0 out of 10 pain  Pt will remain pain free  Will assess q shift  and PRN   Skin   Patient skin intact  Pt's skin will remain intact  Will assess q shift and PRN     Discharge Planning:  Patient discharging home with daughter and son to provide care 24/7   Team Discussion: Patient able to maintain hydration without IVF. She is continent of bowel and bladder. Progress limited by poor endurance for long distances. Patient on target to meet rehab goals: yes, currently min assist for upper body bathing and dressing and min assist for lower body care. Super vision - CGA for stand pivot transfers  wearing right AFO and able to ambulate with a RW and CGA, manage 4 steps with CGA. Requires min - mod assist for ambulation and toileting. Goals set for supervision - min assist overall for discharge.  *See Care Plan and progress notes for long and short-term goals.   Revisions to Treatment Plan:    Teaching Needs: Safety, transfers, medications, secondary stroke risk management, etc  Current Barriers to Discharge: Decreased caregiver support and Home enviroment access/layout  Possible Resolutions to Barriers: Family education 03/22/21     Medical Summary Current Status: Off IVF , po intake improving , BP and CBG improved  Barriers to Discharge: Nutrition means   Possible Resolutions to Barriers/Weekly Focus: cont to monitor hydration off IVF   Continued Need for Acute Rehabilitation Level of Care: The patient requires daily medical management  by a physician with specialized training in physical medicine and rehabilitation for the following reasons: Direction of a multidisciplinary physical rehabilitation program to maximize functional independence : Yes Medical management of patient stability for increased activity during participation in an intensive rehabilitation regime.: Yes Analysis of laboratory values and/or radiology reports with any subsequent need for medication adjustment and/or medical intervention. : Yes   I attest that I was present, lead  the team conference, and concur with the assessment and plan of the team.   Chana Bode B 03/22/2021, 5:03 PM

## 2021-03-22 NOTE — Progress Notes (Signed)
Patient ID: Carrie Kline, female   DOB: 14-Dec-1945, 75 y.o.   MRN: [id number] Team Conference Report to Patient/Family  Team Conference discussion was reviewed with the patient and caregiver, including goals, any changes in plan of care and target discharge date.  Patient and caregiver express understanding and are in agreement.  The patient has a target discharge date of 03/24/21.  SW met with patient. Provided conference updates. Patient on target for d/c Friday. No additional questions or concerns   Dyanne Iha 03/22/2021, 1:40 PM

## 2021-03-22 NOTE — Progress Notes (Signed)
PROGRESS NOTE   Subjective/Complaints:  Progressing well , family in for training discussed need for testing CBG daily at home.  Also need for PCP f/u ROS:  Pt denies SOB, abd pain, CP, N/V/C/D, and vision changes  Objective:   No results found. No results for input(s): WBC, HGB, HCT, PLT in the last 72 hours.  Recent Labs    03/20/21 0513  NA 140  K 4.7  CL 108  CO2 21*  GLUCOSE 107*  BUN 25*  CREATININE 1.03*  CALCIUM 10.2     Intake/Output Summary (Last 24 hours) at 03/22/2021 1128 Last data filed at 03/21/2021 1900 Gross per 24 hour  Intake 476 ml  Output --  Net 476 ml         Physical Exam: Vital Signs Blood pressure (!) 123/57, pulse 82, temperature 98.2 F (36.8 C), temperature source Oral, resp. rate 18, height 5\' 3"  (1.6 m), weight 90 kg, SpO2 99 %.  General: No acute distress Mood and affect are appropriate Heart: Regular rate and rhythm no rubs murmurs or extra sounds Lungs: Clear to auscultation, breathing unlabored, no rales or wheezes Abdomen: Positive bowel sounds, soft nontender to palpation, nondistended Extremities: No clubbing, cyanosis, or edema Skin: No evidence of breakdown, no evidence of rash Neurologic: Cranial nerves II through XII intact, motor strength is 5/5 in left deltoid, bicep, tricep, grip, hip flexor, knee extensors, ankle dorsiflexor and plantar flexor 3-/5 RUE bi, tri, 2/5 finger and wrist , 3- R thumb flexion and 3/5 R hip /knee ext synergy, 0/5 distal RLE--stable exam Sensory exam normal sensation to light touch and proprioception in bilateral upper and lower extremities Neg mystagmus  Musculoskeletal: normal ROM, no pain    Assessment/Plan: 1. Functional deficits which require 3+ hours per day of interdisciplinary therapy in a comprehensive inpatient rehab setting. Physiatrist is providing close team supervision and 24 hour management of active medical problems  listed below. Physiatrist and rehab team continue to assess barriers to discharge/monitor patient progress toward functional and medical goals  Care Tool:  Bathing    Body parts bathed by patient: Right arm, Chest, Abdomen, Right upper leg, Left upper leg, Face, Buttocks, Front perineal area, Left lower leg   Body parts bathed by helper: Left arm, Right lower leg Body parts n/a: Left arm, Right lower leg   Bathing assist Assist Level: Minimal Assistance - Patient > 75%     Upper Body Dressing/Undressing Upper body dressing   What is the patient wearing?: Pull over shirt, Bra    Upper body assist Assist Level: Moderate Assistance - Patient 50 - 74%    Lower Body Dressing/Undressing Lower body dressing      What is the patient wearing?: Underwear/pull up, Pants     Lower body assist Assist for lower body dressing: Minimal Assistance - Patient > 75%     Toileting Toileting    Toileting assist Assist for toileting: Minimal Assistance - Patient > 75%     Transfers Chair/bed transfer  Transfers assist     Chair/bed transfer assist level: Minimal Assistance - Patient > 75%     Locomotion Ambulation   Ambulation assist  Assist level: 2 helpers (min A of 1 and +2 for safety) Assistive device: No Device Max distance: 158ft   Walk 10 feet activity   Assist     Assist level: 2 helpers (min A of 1 and +2 for safety) Assistive device: No Device   Walk 50 feet activity   Assist Walk 50 feet with 2 turns activity did not occur: Safety/medical concerns  Assist level: 2 helpers (min A of 1 and +2 for safety) Assistive device: No Device    Walk 150 feet activity   Assist Walk 150 feet activity did not occur: Safety/medical concerns  Assist level: 2 helpers (min A of 1 and +2 for safety) Assistive device: No Device    Walk 10 feet on uneven surface  activity   Assist Walk 10 feet on uneven surfaces activity did not occur: Safety/medical  concerns         Wheelchair     Assist Will patient use wheelchair at discharge?:  (TBD)   Wheelchair activity did not occur: N/A         Wheelchair 50 feet with 2 turns activity    Assist    Wheelchair 50 feet with 2 turns activity did not occur: N/A       Wheelchair 150 feet activity     Assist  Wheelchair 150 feet activity did not occur: N/A       Blood pressure (!) 123/57, pulse 82, temperature 98.2 F (36.8 C), temperature source Oral, resp. rate 18, height 5\' 3"  (1.6 m), weight 90 kg, SpO2 99 %.  Medical Problem List and Plan: 1.  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  Impaired mobility -DVT/anticoagulation: Lovenox             -antiplatelet therapy: Continue Aspirin 81 mg daily and Plavix 75 mg daily x3 weeks and aspirin alone D/C plavix 7/26 3. Pain Management: N/A 4. Mood: Provide emotional support             -antipsychotic agents: N/A 5. Neuropsych: This patient is capable of making decisions on her own behalf. 6. Skin/Wound Care: Routine skin checks 7. Fluids/Electrolytes/Nutrition: po fluids low, will restart IVF at noc  8.  New findings diabetes mellitus.  Hemoglobin A1c 7.4.  d/c glypizide given good CBG control/hypoglycemic to 65 before lunch. Discussed minimizing added sugar and bread/pasta/rice- she is already doing. Diabetic teaching. CBGs improved. Provided list of foods good for diabetes.  CBG (last 3)  Recent Labs    03/21/21 1721 03/21/21 2113 03/22/21 0628  GLUCAP 98 140* 122*   Now off glipizide may take several days for hypoglycemia to improve  9.  Hypertension.  Well controlled.  Vitals:   03/21/21 1937 03/22/21 0339  BP: (!) 121/56 (!) 123/57  Pulse: 89 82  Resp: 16 18  Temp: 98.4 F (36.9 C) 98.2 F (36.8 C)  SpO2: 99% 99%   Controlled 7/27 off lisinopril 10.  Hyperlipidemia.  Continue  Lipitor 11.  Thyroid nodule.  Follow-up outpatient 12. AKI: encouraged 6-8 glasses of water per day. Stop metformin. Decrease lisinopril to 2.5mg . Monitor creatinine.  Resolved d/c iv and recheck bmet ok    LOS: 21 days A FACE TO FACE EVALUATION WAS PERFORMED  8/27 03/22/2021, 11:28  AM

## 2021-03-22 NOTE — Progress Notes (Signed)
Occupational Therapy Session Note  Patient Details  Name: Carrie Kline MRN: [id number] Date of Birth: [birth year]  Today's Date: 03/22/2021 OT Individual Time: 6962-9528 OT Individual Time Calculation (min): 28 min    Short Term Goals: Week 3:  OT Short Term Goal 1 (Week 3): Continue working on established LTGs at min assist overall.  Skilled Therapeutic Interventions/Progress Updates:    Session 1: [id number])  Pt worked on standing at the high low table with min assist while integrating the RUE for washing the table to targets on the table.  Min assist is needed for full elbow extension without compensation at the trunk.  Seated rest breaks of 2 mins in between sets. Also had her work in sitting on activation of wrist and digit extension, rolling a dowel rod forward on the table with min assist to achieve full digit extension.  Returned to the room at the end of the session with the call button and phone in reach and family present.    Session 2: ([id number])  Pt's daughters present for family education during session.  Had pt complete transfer to the toilet with use of the RW with min assist for balance.  She was able to complete clothing management and toilet hygiene at min assist as well before transferring over to the shower seat.  She was able to bathe sit to stand with min assist overall.  Wash mit was placed on the RUE for use throughout bathing with min to mod assist for washing the left arm and underarm, but she was able to wash other parts as well.  She was able to complete transfer to the wheelchair stand pivot with min assist and then completed dressing at the sink.  Encouraged education on hemi dressing techniques with recommendation for using sports bra but pt reports not liking them.  Max assist for donning bra with min assist for donning pullover shirt.  She was able to complete donning underpants and pants with min assist as well as for donning gripper socks.  Finished session with  pt resting in the wheelchair with the call button and phone in reach and family present.  Discussed use of tub bench for shower transfers and will practice next session.    Session 3: [phone])  Pt's daughters present for family education.  Went down to the tub/shower room where her daughter practiced tub/shower transfers with use of the RW and tub bench.  Educated on putting tub seat together as well as completion of transfer.  Daughter was able to return demonstrate completion of transfer with pt needing min assist for lifting the RLE over the edge of the tub.  Discussed need for removing any throw rugs on the floors as well as having a hand held shower for ease of rinsing while sitting.  Also talked about not completing sit to stand in the shower as her weaker side will be toward the inside wall, making it more difficult to keep her safe.  Instead recommend lateral leans unless grab bar is put in place.  Next, had her transition back to the room via wheelchair where her daughters were educated on AAROM exercises for the RUE.  Handout provided as well for reference with one daughter return demonstrating safe assist with shoulder, elbow, and wrist/digit AROM/AAROM exercises.  Finished session with pt sitting in the wheelchair and with her family present for supervision.  Call button and phone in reach.   Therapy Documentation Precautions:  Precautions Precautions: Fall Precaution Comments: R hemiparesis Restrictions  Weight Bearing Restrictions: No  Pain: Pain Assessment Pain Scale: Faces Pain Score: 0-No pain Faces Pain Scale: No hurt ADL: See Care Tool Section for some details of mobility and selfcare  Therapy/Group: Individual Therapy  Admiral Marcucci OTR/L 03/22/2021, 10:46 AM

## 2021-03-22 NOTE — Progress Notes (Signed)
Physical Therapy Session Note  Patient Details  Name: Carrie Kline MRN: [id number] Date of Birth: 24-Mar-1946  Today's Date: 03/22/2021 PT Individual Time: [id number] and 3009-2330 PT Individual Time Calculation (min): 41 min and 27 min   Short Term Goals: Week 3:  PT Short Term Goal 1 (Week 3): STG = LTG due to ELOS  Skilled Therapeutic Interventions/Progress Updates:    Session 1: Pt received sitting in w/c with her daughters present for family education/training and pt agreeable to therapy session. Therapist educated family on purpose and how to don R LE AFO - pt's daughter assisted with donning with therapist/pt cuing on technique. Educated family on performing daily skin assessments due to AFO. Transported to/from gym in w/c for time management and energy conservation. Educated family on proper positioning to assist pt at her CLOF of CGA/supervision using RW for all standing/ambulation. Pt ambulated ~172ft using RW with pt's daughter providing proper CGA - therapist providing intermittent cuing for proper family positioning with turns. Educated on fall risk safety in the home regarding wearing shoes with R LE AFO, rugs, and floor surface changes. Educated on Conservator, museum/gallery with AD management - step-to pattern leading with L LE on ascent and R LE on descent. Pt navigated 4 steps using B HRs with CGA from family - min cuing for proper positioning for assistance. Educated on proper curb step navigation using RW with pt performing with CGA for steadying, especially when moving RW off curb during descent. Pt ambulated ~134ft using RW with her other daughter providing proper CGA assist. Therapist provided pt with walker bag and educated on importance of maintaining B UE support on AD at all times. Pt transported back to room in w/c. Pt left sitting in w/c with needs in reach and family present. Pt/family report no questions/concerns at this time.  Session 2: Pt received sitting in w/c with  her daughters still present for additional hands-on education/training with pt agreeable to therapy session. Pt reporting significant fatigue. Pt/daughters report no questions/concerns regarding earlier education/training. Transported to/from gym in w/c for time management and energy conservation. Pt already wearing shoes and R LE AFO. Pt's daughter providing hands-on CGA assist during session. Sit<>stands using RW with CGA for safety - pt demoing carryover of education/training to push-up and reach back with R UE to/from w/c armrest to promote NMR and normalized movement patterns. Pt ambulated ~26ft 2x to/from ADL apartment bed using RW with pt's daughter providing proper CGA with intermittent cuing for proper positioning. Supine<>sit on ADL apartment bed in preparation for home with therapist demonstrating how to assist pt with scooting hips backwards on bed; otherwise, pt performed with supervision. Therapist provided pt with the below HEP and educated pt/family on safe set-up of environment and need for family assistance to perform exercises safely. Transported back to room and pt left sitting with needs in reach and family present.   Access Code: [email or web address] URL: [email or web address] Date: 03/22/2021 Prepared by: Casimiro Needle  Exercises Walking - 1 x daily - 7 x weekly - 2 sets - 100 feet hold Supine Bridge - 1 x daily - 7 x weekly - 2 sets - 20 reps Supine Heel Slide - 1 x daily - 7 x weekly - 2 sets - 10 reps Sit to Stand with Armchair - 1 x daily - 7 x weekly - 2 sets - 10 reps Side Stepping with Counter Support - 1 x daily - 7 x weekly - 2 sets - 10 reps Alternating Step Taps with  Counter Support - 1 x daily - 7 x weekly - 2 sets - 10 reps Standing Knee Flexion with Counter Support - 1 x daily - 7 x weekly - 2 sets - 10 reps   Therapy Documentation Precautions:  Precautions Precautions: Fall Precaution Comments: R hemiparesis Restrictions Weight Bearing Restrictions:  No   Pain:  Session 1: Denies pain during session.  Session 2: Denies pain during session.   Therapy/Group: Individual Therapy  Ginny Forth , PT, DPT, NCS, CSRS 03/22/2021, 7:54 AM

## 2021-03-22 NOTE — Discharge Summary (Signed)
Physician Discharge Summary  Patient ID: Carrie Kline MRN: [id number] DOB/AGE: 1946/08/07 75 y.o.  Admit date: 03/01/2021 Discharge date: 03/24/2021  Discharge Diagnoses:  Principal Problem:   Brainstem infarct, acute (HCC) DVT prophylaxis New findings diabetes mellitus Hypertension Hyperlipidemia Thyroid nodule AKI History of gout  Discharged Condition: Stable  Significant Diagnostic Studies: MR BRAIN WO CONTRAST  Result Date: 02/24/2021 CLINICAL DATA:  Neuro deficit, acute stroke suspected. EXAM: MRI HEAD WITHOUT CONTRAST TECHNIQUE: Multiplanar, multiecho pulse sequences of the brain and surrounding structures were obtained without intravenous contrast. COMPARISON:  Same day CT imaging FINDINGS: Brain: Acute infarct in the left pons (series 5, images 64/65). Slight edema without mass effect. No acute hemorrhage, hydrocephalus, mass lesion, abnormal mass effect, or extra-axial fluid collection. There are a few T2/FLAIR hyperintensities within the white matter, most likely related to mild for age chronic microvascular ischemic disease. Partially empty sella. Vascular: Further evaluated on same day CTA. Skull and upper cervical spine: Normal marrow signal. Sinuses/Orbits: Expanded/opacified left ethmoid air cell. Mild-to-moderate paranasal sinus mucosal thickening. Unremarkable orbits. Other: No mastoid effusions. IMPRESSION: Acute infarct in the left pons.  Slight edema without mass effect. Electronically Signed   By: Feliberto Harts MD   On: 02/24/2021 08:23   ECHOCARDIOGRAM COMPLETE  Result Date: 02/24/2021    ECHOCARDIOGRAM REPORT   Patient Name:   Carrie Kline Date of Exam: 02/24/2021 Medical Rec #:  [id number]    Height:       63.0 in Accession #:    [id number]   Weight:       210.0 lb Date of Birth:  [birth year]   BSA:          1.974 m Patient Age:    74 years     BP:           150/61 mmHg Patient Gender: F            HR:           80 bpm. Exam Location:  Inpatient Procedure: 2D Echo,  Cardiac Doppler and Color Doppler Indications:    Stroke I63.9  History:        Patient has no prior history of Echocardiogram examinations.  Sonographer:    Renella Cunas RDCS Referring Phys: [id number] SRISHTI L BHAGAT IMPRESSIONS  1. Left ventricular ejection fraction, by estimation, is 55 to 60%. The left ventricle has normal function. The left ventricle has no regional wall motion abnormalities. Left ventricular diastolic parameters were normal.  2. Right ventricular systolic function is normal. The right ventricular size is normal.  3. The mitral valve is normal in structure. Trivial mitral valve regurgitation.  4. The aortic valve was not well visualized. Aortic valve regurgitation is not visualized. FINDINGS  Left Ventricle: Left ventricular ejection fraction, by estimation, is 55 to 60%. The left ventricle has normal function. The left ventricle has no regional wall motion abnormalities. The left ventricular internal cavity size was normal in size. There is  no left ventricular hypertrophy. Left ventricular diastolic parameters were normal. Right Ventricle: The right ventricular size is normal. Right vetricular wall thickness was not well visualized. Right ventricular systolic function is normal. Left Atrium: Left atrial size was normal in size. Right Atrium: Right atrial size was normal in size. Pericardium: There is no evidence of pericardial effusion. Mitral Valve: The mitral valve is normal in structure. Trivial mitral valve regurgitation. Tricuspid Valve: The tricuspid valve is normal in structure. Tricuspid valve regurgitation is trivial. Aortic Valve: The aortic valve was not  well visualized. Aortic valve regurgitation is not visualized. Pulmonic Valve: The pulmonic valve was normal in structure. Pulmonic valve regurgitation is not visualized. Aorta: The aortic root and ascending aorta are structurally normal, with no evidence of dilitation. IAS/Shunts: The atrial septum is grossly normal.  LEFT VENTRICLE  PLAX 2D LVIDd:         4.70 cm      Diastology LVIDs:         3.70 cm      LV e' medial:    8.85 cm/s LV PW:         0.80 cm      LV E/e' medial:  10.3 LV IVS:        0.80 cm      LV e' lateral:   9.20 cm/s LVOT diam:     1.70 cm      LV E/e' lateral: 9.9 LV SV:         45 LV SV Index:   23 LVOT Area:     2.27 cm  LV Volumes (MOD) LV vol d, MOD A2C: 110.0 ml LV vol d, MOD A4C: 112.0 ml LV vol s, MOD A2C: 45.5 ml LV vol s, MOD A4C: 57.5 ml LV SV MOD A2C:     64.5 ml LV SV MOD A4C:     112.0 ml LV SV MOD BP:      61.2 ml RIGHT VENTRICLE RV S prime:     15.60 cm/s TAPSE (M-mode): 2.0 cm LEFT ATRIUM             Index       RIGHT ATRIUM          Index LA diam:        3.20 cm 1.62 cm/m  RA Area:     9.85 cm LA Vol (A2C):   32.1 ml 16.26 ml/m RA Volume:   20.60 ml 10.43 ml/m LA Vol (A4C):   33.5 ml 16.97 ml/m LA Biplane Vol: 34.6 ml 17.53 ml/m  AORTIC VALVE LVOT Vmax:   88.10 cm/s LVOT Vmean:  60.800 cm/s LVOT VTI:    0.198 m  AORTA Ao Root diam: 2.60 cm MITRAL VALVE MV Area (PHT): 3.08 cm    SHUNTS MV Decel Time: 246 msec    Systemic VTI:  0.20 m MV E velocity: 90.80 cm/s  Systemic Diam: 1.70 cm MV A velocity: 77.10 cm/s MV E/A ratio:  1.18 Kristeen Miss MD Electronically signed by Kristeen Miss MD Signature Date/Time: 02/24/2021/1:55:56 PM    Final    US THYROID  Result Date: 02/25/2021 CLINICAL DATA:  Evaluate left thyroid nodule seen on neck CT. EXAM: THYROID ULTRASOUND TECHNIQUE: Ultrasound examination of the thyroid gland and adjacent soft tissues was performed. COMPARISON:  CT neck 02/24/2021 FINDINGS: Parenchymal Echotexture: Mildly heterogenous Isthmus: 0.4 cm Right lobe: 4.2 x 2.5 x 1.6 cm Left lobe: 4.6 x 2.6 x 2.2 cm _________________________________________________________ Estimated total number of nodules >/= 1 cm: 5 Number of spongiform nodules >/=  2 cm not described below (TR1): 0 Number of mixed cystic and solid nodules >/= 1.5 cm not described below (TR2): 0  _________________________________________________________ Nodule 1 is an isoechoic solid nodule in the superior right thyroid lobe. This nodule measures 1.0 x 0.9 x 0.9 cm. This is a TR 3 nodule. Given size (<1.4 cm) and appearance, this nodule does NOT meet TI-RADS criteria for biopsy or dedicated follow-up. Nodule # 3: Location: Right; Mid Maximum size: 1.9 cm; Other 2 dimensions: 1.9 x  1.5 cm Composition: solid/almost completely solid (2) Echogenicity: isoechoic (1) Shape: taller-than-wide (3) Margins: ill-defined (0) Echogenic foci: none (0) ACR TI-RADS total points: 6. ACR TI-RADS risk category: TR4 (4-6 points). ACR TI-RADS recommendations: **Given size (>/= 1.5 cm) and appearance, fine needle aspiration of this moderately suspicious nodule should be considered based on TI-RADS criteria. _________________________________________________________ Nodule # 4: Location: Right; Inferior Maximum size: 1.2 cm; Other 2 dimensions: 1.1 x 1.2 cm Composition: cannot determine (2) Echogenicity: hypoechoic (2) Shape: not taller-than-wide (0) Margins: ill-defined (0) Echogenic foci: none (0) ACR TI-RADS total points: 4. ACR TI-RADS risk category: TR4 (4-6 points). ACR TI-RADS recommendations: *Given size (>/= 1 - 1.4 cm) and appearance, a follow-up ultrasound in 1 year should be considered based on TI-RADS criteria. _________________________________________________________ Nodule # 5: Location: Left; Mid Maximum size: 2.1 cm; Other 2 dimensions: 2.1 x 2.1 cm Composition: solid/almost completely solid (2) Echogenicity: hypoechoic (2) Shape: not taller-than-wide (0) Margins: smooth (0) Echogenic foci: none (0) ACR TI-RADS total points: 4. ACR TI-RADS risk category: TR4 (4-6 points). ACR TI-RADS recommendations: **Given size (>/= 1.5 cm) and appearance, fine needle aspiration of this moderately suspicious nodule should be considered based on TI-RADS criteria. _________________________________________________________ Nodule #  6: Location: Left; Inferior Maximum size: 1.5 cm; Other 2 dimensions: 0.9 x 1.5 cm Composition: solid/almost completely solid (2) Echogenicity: cannot determine (1) Shape: not taller-than-wide (0) Margins: ill-defined (0) Echogenic foci: none (0) ACR TI-RADS total points: 3. ACR TI-RADS risk category: TR3 (3 points). ACR TI-RADS recommendations: *Given size (>/= 1.5 - 2.4 cm) and appearance, a follow-up ultrasound in 1 year should be considered based on TI-RADS criteria. _________________________________________________________ IMPRESSION: 1. Multiple thyroid nodules. 2. Nodule #3 in the right mid thyroid lobe measures up to 1.9 cm and this is a TR 4 nodule. This nodule meets criteria for biopsy. 3. Nodule #5 in the left mid thyroid lobe measures up to 2.1 cm and this is a TR 4 nodule. This nodule meets criteria for ultrasound-guided biopsy. 4. Nodules #4 and #6 both meet criteria for 1 year follow-up. The above is in keeping with the ACR TI-RADS recommendations - J Am Coll Radiol 2017;14:587-595. Electronically Signed   By: Richarda Overlie M.D.   On: 02/25/2021 09:12   CT HEAD CODE STROKE WO CONTRAST  Result Date: 02/24/2021 CLINICAL DATA:  Code stroke.  75 year old female EXAM: CT HEAD WITHOUT CONTRAST TECHNIQUE: Contiguous axial images were obtained from the base of the skull through the vertex without intravenous contrast. COMPARISON:  Report of [facility] head CT 07/16/2011 (no images available). FINDINGS: Brain: No midline shift, mass effect, or evidence of intracranial mass lesion. No ventriculomegaly. No acute intracranial hemorrhage identified. Cerebral volume is within normal limits for age. Gray-white matter differentiation appears symmetric and within normal limits for age. No evidence of acute or chronic ischemia is identified. Vascular: Calcified atherosclerosis at the skull base. No suspicious intracranial vascular hyperdensity. Left vertebral artery appears  dominant. Skull: No acute osseous abnormality identified. Hyperostosis frontalis, normal variant. 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 Tympanic cavities, hyperplastic petrous apex and mastoid air cells are clear. Other: Visualized orbits and scalp soft tissues are within normal limits. ASPECTS [facility] Stroke Program Early CT Score) Total score (0-10 with 10 being normal): 10 IMPRESSION: 1. Normal for age non contrast CT appearance of the brain. ASPECTS 10. 2. These results were communicated to Dr. Iver Nestle at 5:08 am on  02/24/2021 by text page via the [facility] messaging system. Electronically Signed   By: Odessa FlemingH  Hall M.D.   On: 02/24/2021 05:08   CT ANGIO HEAD NECK W WO CM (CODE STROKE)  Result Date: 02/24/2021 CLINICAL DATA:  Code stroke. 75 year old female woke with slurred speech, right facial droop, right extremity weakness. EXAM: CT ANGIOGRAPHY HEAD AND NECK TECHNIQUE: Multidetector CT imaging of the head and neck was performed using the standard protocol during bolus administration of intravenous contrast. Multiplanar CT image reconstructions and MIPs were obtained to evaluate the vascular anatomy. Carotid stenosis measurements (when applicable) are obtained utilizing NASCET criteria, using the distal internal carotid diameter as the denominator. CONTRAST:  75mL OMNIPAQUE IOHEXOL 350 MG/ML SOLN COMPARISON:  Plain head CT 0502 hours. FINDINGS: CTA NECK Skeleton: Bulky anterior endplate osteophytes in the cervical and upper thoracic spine resulting in some interbody ankylosis, in keeping with Diffuse idiopathic skeletal hyperostosis (DISH). Occasional dental periapical lucency. Partially visible upper thoracic scoliosis. No acute osseous abnormality identified. Upper chest: Negative upper lungs. Other neck: 2 cm left thyroid nodule Recommend thyroid US (ref: J Am Coll Radiol. 2015 Feb;12(2): 143-50).Partially  retropharyngeal course of both carotids, normal variant. Otherwise negative neck soft tissues. Aortic arch: 3 vessel arch configuration. Mild for age arch atherosclerosis. Right carotid system: Tortuous brachiocephalic artery with normal right CCA origin. Mildly tortuous and retropharyngeal right CCA. Negative right carotid bifurcation. Mildly tortuous cervical right ICA. Left carotid system: Mildly tortuous proximal left CCA. Mild soft plaque at the level of the thyroid. No CCA stenosis. Minimal calcified plaque at the lateral left ICA origin and bulb without stenosis. Vertebral arteries: Tortuous proximal right subclavian artery without plaque or stenosis. Normal right vertebral artery origin. Somewhat non dominant right vertebral artery with tortuous V2 segment but no plaque or stenosis to the skull base. Mild atherosclerosis in the proximal left subclavian artery without significant stenosis. Calcified plaque at the left vertebral artery origin with moderate origin stenosis (series 9, image 113). But otherwise patent and mildly dominant left vertebral artery to the skull base with no additional plaque. CTA HEAD Posterior circulation: Dominant left V4 segment with calcified plaque but only mild stenosis (series 7, image 132). Tortuous vertebrobasilar junction. Normal PICA origins. Non dominant distal right vertebral artery without stenosis. Patent basilar artery without stenosis. Patent SCA and left PCA origins. Fetal right PCA origin. Diminutive or absent left posterior communicating artery. Proximal PCAs are within normal limits. The bilateral P3 branches are mildly to moderately irregular, more so on the right (series 12, image 18). Anterior circulation: Both ICA siphons are patent. Cavernous and supraclinoid left siphon calcified plaque results in only mild stenosis. Similar right siphon mild calcified plaque without significant stenosis. Normal right posterior communicating artery origin. Patent carotid  termini, MCA and ACA origins. A1 segments with diminutive anterior communicating artery. Bilateral ACA branches are within normal limits. Right MCA M1 segment, bifurcation, and right MCA branches are within normal limits. Left MCA M1 segment and by or trifurcation are patent with no stenosis identified. No left MCA branch occlusion is identified. Venous sinuses: Patent. Left transverse and sigmoid sinuses appear dominant. Anatomic variants: Mildly dominant left vertebral artery. Fetal type right PCA origin. Review of the MIP images confirms the above findings IMPRESSION: 1. Negative for large vessel occlusion. And no circle-of-Willis branch occlusion identified. 2. Generally mild carotid and anterior circulation atherosclerosis. But more pronounced vertebral and posterior circulation atherosclerosis including: - Moderate stenosis at the origin of the dominant left vertebral artery. - mild  to moderate stenoses of the distal PCA branches. 3. A 2 cm left thyroid nodule meets size criteria for further characterization. Recommend Thyroid Ultrasound. 4. Diffuse idiopathic skeletal hyperostosis (DISH). Electronically Signed   By: Odessa Fleming M.D.   On: 02/24/2021 05:46    Labs:  Basic Metabolic Panel: Recent Labs  Lab 03/20/21 0513  NA 140  K 4.7  CL 108  CO2 21*  GLUCOSE 107*  BUN 25*  CREATININE 1.03*  CALCIUM 10.2    CBC: No results for input(s): WBC, NEUTROABS, HGB, HCT, MCV, PLT in the last 168 hours.  CBG: Recent Labs  Lab 03/22/21 1631 03/22/21 2052 03/23/21 0600 03/23/21 1153 03/23/21 2123  GLUCAP 127* 153* 118* 181* 129*   Family history.  Father with myocardial infarction Sister with diabetes a daughter with thyroid disease.  Denies any colon cancer esophageal cancer or rectal cancer  Brief HPI:   Carrie Kline is a 75 y.o. right-handed female with history of gout as well as back surgery.  Per chart review lives with her children.  Independent prior to admission.  Presented 02/24/2021  with acute onset of right side weakness mild slurred speech.  CT/MRI showed acute infarct left pons.  Slight edema without mass-effect.  Patient did not receive tPA.  CT angiogram head and neck negative for large vessel occlusion.  Echocardiogram with ejection fraction of 55 to 60% no wall motion abnormalities.  Admission chemistries unremarkable except glucose 240 hemoglobin A1c 7.4.  Maintained on aspirin as well as Plavix for CVA prophylaxis x3 weeks total then aspirin alone.  Subcutaneous Lovenox for DVT prophylaxis.  Incidental findings thyroid nodule ultrasound of thyroid showing parenchymal echotexture recommend outpatient follow-up.  Patient tolerating a regular diet.  Glucophage initiated for new findings of diabetes mellitus.  Due to patient's right side weakness therapy evaluations completed was admitted for a comprehensive rehab program.   Hospital Course: Carrie Kline was admitted to rehab 03/01/2021 for inpatient therapies to consist of PT, ST and OT at least three hours five days a week. Past admission physiatrist, therapy team and rehab RN have worked together to provide customized collaborative inpatient rehab.  Pertain to patient's left pontine infarction remained stable plan to be continue aspirin Plavix x3 weeks total then aspirin alone.  Patient would follow-up neurology services.  Subcutaneous Lovenox for DVT prophylaxis no bleeding episodes.  New findings diabetes mellitus hemoglobin A1c 7.4 initially on glipizide discontinued as blood sugars were better controlled discussed minimizing added sugar and bread as well as pasta and rice with dietary teaching completed patient would need outpatient follow-up.  Patient had initially been placed on metformin discontinued due to mild AKI.  Blood pressure controlled initially on lisinopril discontinued due to mild AKI requiring IV fluids later discontinued.  Lipitor ongoing for hyperlipidemia.   Blood pressures were monitored on TID basis and soft  and monitored  Diabetes has been monitored with ac/hs CBG checks and SSI was use prn for tighter BS control.    Rehab course: During patient's stay in rehab weekly team conferences were held to monitor patient's progress, set goals and discuss barriers to discharge. At admission, patient required minimal assist sit to side-lying mod assist side-lying to sitting +2 safety sit to stand minimal assist upper body dressing total assist lower body dressing  Physical exam.  Blood pressure 111/71 pulse 80 temperature 98.1 respirations 18 oxygen saturations 95% room air Neurologic.  Alert oriented makes eye contact with examiner mild dysarthria fully intelligible oriented x3.  Fair insight  and awareness. HEENT Head.  Normocephalic and atraumatic Eyes.  Pupils round and reactive to light no discharge.nystagmus Neck.  Supple nontender no JVD without thyromegaly Cardiac regular rate rhythm without extra sounds or murmur heard Abdomen.  Soft nontender positive bowel sounds without rebound Respiratory effort normal no respiratory distress without wheeze Neurologic/musculoskeletal.  Cranial nerves II through XII intact motor strength 5/5 left deltoid bicep tricep grip hip flexor knee extensor ankle dorsi plantarflexion 2/5 right deltoid bicep tricep, 0/5 FF, 2/5 thumb flexion, 2 -/5 hip knee extensor synergy 0/5 right lower extremity Sensory exam intact.  He/She  has had improvement in activity tolerance, balance, postural control as well as ability to compensate for deficits. He/She has had improvement in functional use RUE/LUE  and RLE/LLE as well as improvement in awareness.  Donned bilateral lower extremity shoes and right lower extremity AFO max assist for time management.  Sit to stand wheelchair rolling walker contact-guard assist.  Ambulates 220 feet total to the main gym with rolling walker minimal assist.  Gait to and from bars no assistive device and minimal assist for balance.  Right foot taps on  and off a 6 inch step with manual facilitation for increased hip and knee flexion.  She needed mod assist for ambulation to the shower without AFO and without assistive device.  Once in shower she was able to remove all clothing with minimal assist and then complete shower at the same level.  Wash mitt was issued to assist with incorporating the right upper extremity to wash with moderate assist overall.  She continues to need min mod assist for crossing the right lower extremity over the left lower extremity and maintain for bathing applying lotion and dressing tasks.  She was able to don her underpants and pants and minimal assist level as well as minimal assist for gripper socks.  Full family teaching completed plan discharged to home       Disposition: Discharged to home    Diet: Carb modified  Special Instructions: No driving smoking or alcohol  Patient should have follow-up outpatient for evaluation of incidental finding of thyroid nodule  Follow-up PCP for ongoing management of diabetes mellitus.  Medications at discharge 1.  Tylenol as needed 2.  Aspirin 81 mg p.o. daily 3.  Lipitor 40 mg p.o. daily  30-35 minutes were spent completing discharge summary and discharge planning  Discharge Instructions     Ambulatory referral to Neurology   Complete by: As directed    An appointment is requested in approximately: 4 weeks left pontine infarction   Ambulatory referral to Physical Medicine Rehab   Complete by: As directed    Moderate complexity follow-up 1 to 2 weeks left pontine infarction        Follow-up Information     Kirsteins, Victorino Sparrow, MD Follow up.   Specialty: Physical Medicine and Rehabilitation Why: Office to call for appointment Contact information: [address] [place] [address] [phone]                 Signed: Charlton Amor 03/24/2021, 5:24 AM

## 2021-03-22 NOTE — Progress Notes (Signed)
Patient ID: Carrie Kline, female   DOB: 10-24-1945, 75 y.o.   MRN: [id number]  [name] ordered through [employer], [place] [phone]

## 2021-03-23 LAB — GLUCOSE, CAPILLARY
Glucose-Capillary: 118 mg/dL — ABNORMAL HIGH (ref 70–99)
Glucose-Capillary: 181 mg/dL — ABNORMAL HIGH (ref 70–99)
Glucose-Capillary: 129 mg/dL — ABNORMAL HIGH (ref 70–99)

## 2021-03-23 MED ORDER — ATORVASTATIN CALCIUM 40 MG PO TABS: 40.0000 mg | ORAL_TABLET | Freq: Every day | ORAL | 0 refills | Status: DC

## 2021-03-23 NOTE — Progress Notes (Signed)
Occupational Therapy Session Note  Patient Details  Name: Carrie Kline MRN: [id number] Date of Birth: 1946-06-11  Today's Date: 03/23/2021 OT Individual Time: 0802-0859 OT Individual Time Calculation (min): 57 min    Short Term Goals: Week 3:  OT Short Term Goal 1 (Week 3): Continue working on established LTGs at min assist overall.  Skilled Therapeutic Interventions/Progress Updates:    Pt completed transfer to the 3:1 over the toilet with min assist using the RW for support.  She was able to complete toilet hygiene and clothing management with min assist as well.  Pt with completion of transfer to the shower seat with min assist as well with completion of bathing tasks at the same level.  Min assist for integration of the RUE for washing the left arm and part of the right leg.  She was able to hang onto a regular washcloth as well and did not use the wash mit this session.  She transferred to the wheelchair at min assist for completion of dressing tasks.  Max assist for front fastening bra with min assist for pullover shirt.  Min assist also for underpants and pants following hemi technique of crossing the RLE over the left knee.  Next, she was able to donn her socks at min assist level but needed total assist for the right shoe and AFO as well as mod assist for the left including max assist for tying.  Finished session with pt in the wheelchair with the call button and phone in reach and her family present.  Therapy Documentation Precautions:  Precautions Precautions: Fall Precaution Comments: R hemiparesis Restrictions Weight Bearing Restrictions: No   Pain: Pain Assessment Pain Scale: 0-10 Pain Score: 0-No pain ADL: See Care Tool Section for some details of mobility and selfcare   Therapy/Group: Individual Therapy  Albin Duckett OTR/L 03/23/2021, 12:08 PM

## 2021-03-23 NOTE — Progress Notes (Signed)
Physical Therapy Session Note  Patient Details  Name: Carrie Kline MRN: [id number] Date of Birth: [birth year]  Today's Date: 03/23/2021 PT Individual Time: 0180-9704 PT Individual Time Calculation (min): 56 min   Short Term Goals: Week 3:  PT Short Term Goal 1 (Week 3): STG = LTG due to ELOS  Skilled Therapeutic Interventions/Progress Updates:  Patient seated in w/c on entrance to room. Pt's daughters also in room. Patient alert and agreeable to PT session. Patient denied pain during session.  Therapeutic Activity: Transfers: Patient performed STS and SPVT transfers throughout session with close supervision. Provided verbal cues for minor aspects of technique.  Gait Training:  Patient ambulated 120 feet using RW with light CGA and w/c follow from dtr. Demonstrated good foot clearance with RLE with advancement. L shoulder elevation, decreased time on RLE. Provided vc/ tc for relaxation of L shoulder with increased use of RUE on walker as well as spending more time on RLE during stance phase of gait. Education to pt that with progress  comes "fine tuning" of quality of gait that pt will need to progress in OPPT.  Neuromuscular Re-ed: NMR facilitated during session with focus on motor control, muscle activation, standing balance. Pt guided in toe touches to 6" step and able to touch with RLE with good clearance 2x5reps. Toe touches with LLE 1x10 reps with one instance of R knee buckle at start of activity. Pt also able to perform turning 360 deg in place in each direction with no UE support and close supervision. Lateral stepping with HHA for 10 feet in each direction. Pt demos heavy lean into LUE throughout with increased time spent on LLE. VC in attempt to correct with pt letting go with L hand but continued L arm lean into therapist's arm. NMR performed for improvements in motor control and coordination, balance, sequencing, judgement, and self confidence/ efficacy in performing all aspects of  mobility at highest level of independence.   Patient seated upright  in w/c at end of session with brakes locked, hemi tray to w/c, belt alarm set, tray table to front of pt, and all needs within reach. Daughters present.      Therapy Documentation Precautions:  Precautions Precautions: Fall Precaution Comments: R hemiparesis Restrictions Weight Bearing Restrictions: No   Therapy/Group: Individual Therapy  Loel Dubonnet PT, DPT 03/23/2021, 7:02 PM

## 2021-03-23 NOTE — Progress Notes (Signed)
Patient ID: Carrie Kline, female   DOB: Jun 06, 1946, 75 y.o.   MRN: [id number]  SW OP referral faxed to [facility] OP on 7/27.  [place], [place] [phone]

## 2021-03-23 NOTE — Progress Notes (Signed)
PROGRESS NOTE   Subjective/Complaints: Family in for training today , discussed CBG and BP  No issues overnite  ROS:  Pt denies SOB, abd pain, CP, N/V/C/D, and vision changes  Objective:   No results found. No results for input(s): WBC, HGB, HCT, PLT in the last 72 hours.  No results for input(s): NA, K, CL, CO2, GLUCOSE, BUN, CREATININE, CALCIUM in the last 72 hours.   Intake/Output Summary (Last 24 hours) at 03/23/2021 0752 Last data filed at 03/22/2021 1930 Gross per 24 hour  Intake 1077 ml  Output --  Net 1077 ml         Physical Exam: Vital Signs Blood pressure 123/80, pulse 84, temperature 98.2 F (36.8 C), resp. rate 18, height 5\' 3"  (1.6 m), weight 90 kg, SpO2 100 %.  General: No acute distress Mood and affect are appropriate Heart: Regular rate and rhythm no rubs murmurs or extra sounds Lungs: Clear to auscultation, breathing unlabored, no rales or wheezes Abdomen: Positive bowel sounds, soft nontender to palpation, nondistended Extremities: No clubbing, cyanosis, or edema Skin: No evidence of breakdown, no evidence of rash   Neurologic: Cranial nerves II through XII intact, motor strength is 5/5 in left deltoid, bicep, tricep, grip, hip flexor, knee extensors, ankle dorsiflexor and plantar flexor 3-/5 RUE bi, tri, 2/5 finger and wrist , 3- R thumb flexion and 3/5 R hip /knee ext synergy, 0/5 distal RLE--stable exam Sensory exam normal sensation to light touch and proprioception in bilateral upper and lower extremities Neg mystagmus  Musculoskeletal: normal ROM, no pain    Assessment/Plan: 1. Functional deficits which require 3+ hours per day of interdisciplinary therapy in a comprehensive inpatient rehab setting. Physiatrist is providing close team supervision and 24 hour management of active medical problems listed below. Physiatrist and rehab team continue to assess barriers to discharge/monitor  patient progress toward functional and medical goals  Care Tool:  Bathing    Body parts bathed by patient: Right arm, Chest, Abdomen, Right upper leg, Left upper leg, Face, Buttocks, Front perineal area, Left lower leg   Body parts bathed by helper: Left arm, Right lower leg Body parts n/a: Left arm, Right lower leg   Bathing assist Assist Level: Minimal Assistance - Patient > 75%     Upper Body Dressing/Undressing Upper body dressing   What is the patient wearing?: Pull over shirt, Bra    Upper body assist Assist Level: Moderate Assistance - Patient 50 - 74%    Lower Body Dressing/Undressing Lower body dressing      What is the patient wearing?: Underwear/pull up, Pants     Lower body assist Assist for lower body dressing: Minimal Assistance - Patient > 75%     Toileting Toileting    Toileting assist Assist for toileting: Minimal Assistance - Patient > 75%     Transfers Chair/bed transfer  Transfers assist     Chair/bed transfer assist level: Contact Guard/Touching assist Chair/bed transfer assistive device: Cane, Orthosis   Locomotion Ambulation   Ambulation assist      Assist level: Contact Guard/Touching assist Assistive device: Walker-rolling Max distance: 137ft   Walk 10 feet activity   Assist  Assist level: Contact Guard/Touching assist Assistive device: Walker-rolling   Walk 50 feet activity   Assist Walk 50 feet with 2 turns activity did not occur: Safety/medical concerns  Assist level: Contact Guard/Touching assist Assistive device: Walker-rolling    Walk 150 feet activity   Assist Walk 150 feet activity did not occur: Safety/medical concerns  Assist level: 2 helpers (min A of 1 and +2 for safety) Assistive device: No Device    Walk 10 feet on uneven surface  activity   Assist Walk 10 feet on uneven surfaces activity did not occur: Safety/medical concerns         Wheelchair     Assist Will patient use  wheelchair at discharge?: No   Wheelchair activity did not occur: N/A         Wheelchair 50 feet with 2 turns activity    Assist    Wheelchair 50 feet with 2 turns activity did not occur: N/A       Wheelchair 150 feet activity     Assist  Wheelchair 150 feet activity did not occur: N/A       Blood pressure 123/80, pulse 84, temperature 98.2 F (36.8 C), resp. rate 18, height 5\' 3"  (1.6 m), weight 90 kg, SpO2 100 %.  Medical Problem List and Plan: 1.  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  Impaired mobility -DVT/anticoagulation: Lovenox             -antiplatelet therapy: Continue Aspirin 81 mg daily and Plavix 75 mg daily x3 weeks and aspirin alone D/C plavix 7/26 3. Pain Management: N/A 4. Mood: Provide emotional support             -antipsychotic agents: N/A 5. Neuropsych: This patient is capable of making decisions on her own behalf. 6. Skin/Wound Care: Routine skin checks 7. Fluids/Electrolytes/Nutrition: po fluids low, will restart IVF at noc  8.  New findings diabetes mellitus.  Hemoglobin A1c 7.4.  d/c glypizide given good CBG control/hypoglycemic to 65 before lunch. Discussed minimizing added sugar and bread/pasta/rice- she is already doing. Diabetic teaching. CBGs improved. Provided list of foods good for diabetes.  CBG (last 3)  Recent Labs    03/22/21 1631 03/22/21 2052 03/23/21 0600  GLUCAP 127* 153* 118*   Now off glipizide controlled 7/28 9.  Hypertension.  Well controlled.  Vitals:   03/22/21 2008 03/23/21 0431  BP: 127/62 123/80  Pulse: 88 84  Resp: 18 18  Temp: 98.3 F (36.8 C) 98.2 F (36.8 C)  SpO2: 100% 100%   Controlled 7/28 off lisinopril 10.  Hyperlipidemia.  Continue Lipitor 11.  Thyroid nodule.  Follow-up outpatient 12. AKI: encouraged 6-8 glasses of water per day. Stop metformin. Decrease lisinopril  to 2.5mg . Monitor creatinine.  Resolved d/c iv and recheck bmet ok    LOS: 22 days A FACE TO FACE EVALUATION WAS PERFORMED  8/28 03/23/2021, 7:52 AM

## 2021-03-23 NOTE — Progress Notes (Signed)
Occupational Therapy Discharge Summary  Patient Details  Name: Carrie Kline MRN: [id number] Date of Birth: [birth year]  Today's Date: 03/23/2021 OT Individual Time: 2620-3559 OT Individual Time Calculation (min): 40 min   Session Note:  Pt completed toilet transfer with use of the RW for support at min assist level.  Pt still needing min assist for management of clothing on the right side to pull over hips.  She was able to ambulate up to the sink for completion of washing her hands at min guard assist level.  Transferred to the wheelchair and then down to the therapy gym where she transferred from the wheelchair to supine with min assist.  Worked on RUE active movement with use of the therapy ball, having pt hold the ball at 90 degrees shoulder flexion and elbow extension.  Min to mod assist to maintain position, working in and out of shoulder flexion for episodes of 1-2 mins.  Transitioned to sitting for work on active use with the [employer] as well.  Decreased active terminal elbow extension when pushing ranger to target with mod facilitation to avoid trunk compensation.  Finished with transfer back to the wheelchair with min assist with return to the room.  Pt left with family in the room and call button and phone in reach.    Patient has met 13 of 13 long term goals due to improved activity tolerance, improved balance, postural control, ability to compensate for deficits, functional use of  RIGHT upper and RIGHT lower extremity, and improved coordination.  Patient to discharge at [facility] Assist level.  Patient's care partner is independent to provide the necessary physical assistance at discharge.    Reasons goals not met: NA  Recommendation:  Patient will benefit from ongoing skilled OT services in outpatient setting to continue to advance functional skills in the area of BADL, iADL, and Reduce care partner burden.  Pt continues to need min assist for selfcare tasks sit to stand and  functional transfers.  She also still demonstrates decreased RUE and RLE strength and functional use.  Will continue to benefit from follow-up outpatient OT to progress to modified independence level and increased RUE functional use to a diminished level or better.    Equipment: 3:1 and tub bench  Reasons for discharge: treatment goals met and discharge from hospital  Patient/family agrees with progress made and goals achieved: Yes  OT Discharge Precautions/Restrictions  Precautions Precautions: Fall Precaution Comments: R hemiparesis Restrictions Weight Bearing Restrictions: No  Pain Pain Assessment Pain Scale: 0-10 Pain Score: 0-No pain ADL ADL Eating: Modified independent Where Assessed-Eating: Wheelchair Grooming: Modified independent Where Assessed-Grooming: Wheelchair Upper Body Bathing: Minimal assistance Where Assessed-Upper Body Bathing: Chair, Multimedia programmer Lower Body Bathing: Contact guard Where Assessed-Lower Body BathingChief Strategy Officer, Chair Upper Body Dressing: Minimal assistance Where Assessed-Upper Body Dressing: Wheelchair Lower Body Dressing: Minimal assistance Where Assessed-Lower Body Dressing: Wheelchair Toileting: Minimal assistance Where Assessed-Toileting: Recruitment consultant Transfer: Minimal assistance Armed forces technical officer Method: Counselling psychologist: Radiographer, therapeutic: Minimal Museum/gallery conservator Method: Optometrist: Facilities manager: Minimal assistance Social research officer, government Method: Heritage manager: Shower seat with back Vision Baseline Vision/History: Wears glasses Wears Glasses: At all times Patient Visual Report: No change from baseline Vision Assessment?: Yes Eye Alignment: Within Functional Limits Alignment/Gaze Preference: Within Defined Limits Convergence: Within functional limits Visual Fields: No apparent deficits Perception   Perception: Within Functional Limits Praxis Praxis: Intact Cognition Overall Cognitive Status: Within Functional Limits for  tasks assessed Arousal/Alertness: Awake/alert Attention: Sustained;Selective Selective Attention: Appears intact Memory: Appears intact Safety/Judgment: Appears intact Sensation Sensation Light Touch: Appears Intact Hot/Cold: Appears Intact Proprioception: Impaired Detail Stereognosis: Not tested Additional Comments: Pt with slight decreased proprioception in the right wrist during testing. Coordination Gross Motor Movements are Fluid and Coordinated: No Fine Motor Movements are Fluid and Coordinated: No Coordination and Movement Description: Brunnstrum stage IV movement in the right arm and hand.  She uses at a gross assist with supervision overall and as an active assist with min to mod facilitation with bathing and dressing tasks. Motor  Motor Motor: Hemiplegia Motor - Discharge Observations: Still with RUE and RLE hemiparesis Mobility  Bed Mobility Bed Mobility: Rolling Right;Right Sidelying to Sit Rolling Right: Supervision/verbal cueing Right Sidelying to Sit: Minimal Assistance - Patient > 75% Transfers Sit to Stand: Contact Guard/Touching assist Stand to Sit: Contact Guard/Touching assist  Trunk/Postural Assessment  Cervical Assessment Cervical Assessment: Within Functional Limits Thoracic Assessment Thoracic Assessment: Exceptions to [facility] (thoracic rounding) Lumbar Assessment Lumbar Assessment: Exceptions to [facility] (posterior pelvic tilt)  Balance Balance Balance Assessed: Yes Static Sitting Balance Static Sitting - Balance Support: No upper extremity supported;Feet supported Static Sitting - Level of Assistance: 7: Independent Dynamic Sitting Balance Dynamic Sitting - Balance Support: During functional activity Dynamic Sitting - Level of Assistance: 5: Stand by assistance Static Standing Balance Static Standing - Balance Support: During  functional activity;No upper extremity supported Static Standing - Level of Assistance:  (contact guard) Dynamic Standing Balance Dynamic Standing - Balance Support: During functional activity;No upper extremity supported Dynamic Standing - Level of Assistance: 4: Min assist Extremity/Trunk Assessment RUE Assessment RUE Assessment: Exceptions to [facility] Passive Range of Motion (PROM) Comments: Shoulder flexion 0-150 degrees with some end range pain noted Active Range of Motion (AROM) Comments: Brunnstrum stage IV movement in the arm and hand General Strength Comments: She demonstrates synergy pattern for shoulder and elbow movement.  Gross digit flexion is at 80% with extension at 70%.  Decreased ability to maintain wrist extension while completing digit extension.  Uses functionally at a gross assist level with supervision but needs min to mod assist to incorporate at an active assist level for pulling up clothing or washing the LUE. LUE Assessment LUE Assessment: Within Functional Limits   Amed Datta OTR/L 03/23/2021, 5:04 PM

## 2021-03-23 NOTE — Progress Notes (Signed)
Physical Therapy Discharge Summary  Patient Details  Name: Carrie Kline MRN: [id number] Date of Birth: 03-13-1946  Today's Date: 03/23/2021 PT Individual Time: 1740-8144 PT Individual Time Calculation (min): 45 min   and  Today's Date: 03/23/2021 PT Missed Time: 15 Minutes Missed Time Reason: Patient fatigue   Patient has met 7 of 7 long term goals due to improved activity tolerance, improved balance, improved postural control, increased strength, ability to compensate for deficits, functional use of  right upper extremity and right lower extremity, improved attention, improved awareness, and improved coordination.  Patient to discharge at an ambulatory level using RW with  CGA .   Patient's care partners attended hands-on education/training and are independent to provide the necessary physical assistance at discharge.  All goals met.  Recommendation:  Patient will benefit from ongoing skilled PT services in outpatient setting to continue to advance safe functional mobility, address ongoing impairments in R LE NMR, dynamic standing balance, gait training with LRAD, and minimize fall risk.  Equipment: RW  and transport chair  Reasons for discharge: treatment goals met and discharge from hospital  Patient/family agrees with progress made and goals achieved: Yes  Skilled Therapeutic Interventions/Progress Updates:  Pt received sitting in w/c and agreeable to therapy session. Pt's daughters present for additional education/training; however, reporting no questions/concerns about the education performed thus far.  Transported to/from gym in w/c for time management and energy conservation. Sit<>stands w/c<>RW with close supervision provided by family - continued cuing to ensure proper R LE foot placement prior to coming to stand and pt demoing carryover of importance of pushing up from armrests with R hand when going to stand. Simulated ambulatory car transfer using RW with CGA provided by  daughters - therapist educated on proper technique/sequencing with pt/daughter demonstrating understanding. Stair navigation 12steps using B HRs with step-to pattern leading with L LE on ascent and R LE on descent - had pt progress to reciprocal pattern on ascent with education on progression to normalized gait mechanics in future therapy appointments - educated pt/family to perform step-to pattern at home at this time. Pt reporting increasing fatigue and requesting to return to room to rest., but agreeable to gait training. Gait training ~234f to room using RW with CGA - therapist cuing/facilitating to maintain upright posture (as pt tends to flex forward and push down on RW), cuing/facilitation for increased R hip stability during stance to promote increased reciprocal stepping pattern and decrease swing phase compensations of circumduction and increased pelvic rotation. Sit>supine with supervision. Pt left supine in bed with needs in reach and bed alarm on. Missed 15 minutes of skilled physical therapy.  PT Discharge Precautions/Restrictions Precautions Precautions: Fall Precaution Comments: R hemiparesis Restrictions Weight Bearing Restrictions: No Pain Pain Assessment Pain Scale: 0-10 Pain Score: 0-No pain Perception  Perception Perception: Within Functional Limits Praxis Praxis: Intact  Cognition Overall Cognitive Status: Within Functional Limits for tasks assessed Arousal/Alertness: Awake/alert Orientation Level: Oriented X4 Attention: Focused;Sustained;Selective Focused Attention: Appears intact Sustained Attention: Appears intact Selective Attention: Appears intact Memory: Appears intact Awareness: Appears intact Safety/Judgment: Appears intact Sensation Sensation Light Touch: Appears Intact Hot/Cold: Not tested Proprioception: Impaired Detail Proprioception Impaired Details: Impaired RLE Stereognosis: Not tested Coordination Gross Motor Movements are Fluid and  Coordinated: No Coordination and Movement Description: GM movements continue to be impaired due to R hemiparesis although improving Motor  Motor Motor: Hemiplegia;Abnormal postural alignment and control Motor - Discharge Observations: Still with RUE and RLE hemiparesis  Mobility Bed Mobility Bed Mobility: Sit to  Supine;Supine to Sit Rolling Right: Supervision/verbal cueing Right Sidelying to Sit: Minimal Assistance - Patient > 75% Supine to Sit: Supervision/Verbal cueing Sit to Supine: Supervision/Verbal cueing Transfers Transfers: Sit to Stand;Stand to Sit;Stand Pivot Transfers Sit to Stand: Supervision/Verbal cueing Stand to Sit: Supervision/Verbal cueing Stand Pivot Transfers: Contact Guard/Touching assist Stand Pivot Transfer Details: Verbal cues for safe use of DME/AE;Verbal cues for technique;Verbal cues for sequencing Transfer (Assistive device): Rolling walker Locomotion  Gait Ambulation: Yes Gait Assistance: Contact Guard/Touching assist Gait Distance (Feet): 200 Feet Assistive device: Rolling walker;Other (Comment) (R LE AFO) Gait Assistance Details: Verbal cues for safe use of DME/AE;Verbal cues for gait pattern;Verbal cues for sequencing;Tactile cues for posture Gait Assistance Details: pt noted to start having anterior trunk lean with WBing down through arms and forward placement of RW - educated pt on importance of maintaining trunk upright/extended to facilitate normal gait mechanics with pelvic movement and weight shifting Gait Gait: Yes Gait Pattern: Impaired Gait Pattern: Decreased stance time - right;Decreased stride length;Decreased hip/knee flexion - right;Decreased dorsiflexion - right;Decreased weight shift to right;Trunk flexed;Poor foot clearance - right Gait velocity: decreased though improving, pt wearing R LE AFO Stairs / Additional Locomotion Stairs: Yes Stairs Assistance: Contact Guard/Touching assist Stair Management Technique: Two rails;Step to  pattern Number of Stairs: 12 Height of Stairs: 6 Ramp: Contact Guard/touching assist Curb: Contact Guard/Touching assist Wheelchair Mobility Wheelchair Mobility: No  Trunk/Postural Assessment  Cervical Assessment Cervical Assessment: Within Functional Limits Thoracic Assessment Thoracic Assessment: Exceptions to [facility] (thoracic rounding) Lumbar Assessment Lumbar Assessment: Exceptions to [facility] (posterior pelvic tilt)  Balance Balance Balance Assessed: Yes Standardized Balance Assessment Standardized Balance Assessment: PASS Static Sitting Balance Static Sitting - Balance Support: No upper extremity supported;Feet supported Static Sitting - Level of Assistance: 7: Independent Dynamic Sitting Balance Dynamic Sitting - Balance Support: During functional activity Dynamic Sitting - Level of Assistance: 5: Stand by assistance Static Standing Balance Static Standing - Balance Support: During functional activity;Bilateral upper extremity supported Static Standing - Level of Assistance: 5: Stand by assistance Dynamic Standing Balance Dynamic Standing - Balance Support: During functional activity;Bilateral upper extremity supported Dynamic Standing - Level of Assistance: Other (comment) (CGA)  Postural Assessment Scale for Stroke   Maintaining a posture   (__3_)Item 1: Sitting without support (sitting on the edge of a 50cm-high examination table with feet touching the floor)   0 = cannot sit 1 = can sit with slight support (e.g. by 1 hand) 2 = can sit for more than 10 seconds without support 3 = can sit for 5 minutes without support   (_3__)Item 2: Standing with support (feet position free, no other constraints)   0 = cannot stand, even with support 1 = can stand with strong support of 2 people 2 = can stand with moderate support of 1 person 3 = can stand with support on only 1 hand   (_2__)Item 3: Standing without support (feet position free, no other constraints)   0 = cannot  stand without support 1 = can stand without support for 10 seconds or leans heavily on 1 leg 2 = can stand without support for 1 minute or stands slightly asymmetrically 3 = can stand without support for more than 1 minute and at the same time perform arm movements above the shoulder level   (_1__)Item 4: Standing on the nonparetic leg (no other constraints)   0 = cannot stand on the leg 1 = can stand on the leg for a few seconds 2 = can  stand on the leg for more than 5 seconds 3 = can stand on the leg for more than 10 seconds   (_1__)Item 5: Standing on the paretic leg (no other restraints)   0 = cannot stand on the leg 1 = can stand on the leg for a few seconds 2 = can stand on the leg for more than 5 seconds 3 = can stand on the leg for more than 10 seconds   Changing Posture   (_3__)Item 6: Supine to affected side lateral   0 = cannot perform the activity 1 = can perform the activity with much help 2 = can perform the activity with little help 3 = can perform the activity without help   (_3__)Item 7: Supine to nonaffected side lateral   0 = cannot perform the activity 1 = can perform the activity with much help 2 = can perform the activity with little help 3 = can perform the activity without help   (_3__)Item 8: Supine to sitting up on edge of table   0 = cannot perform the activity 1 = can perform the activity with much help 2 = can perform the activity with little help 3 = can perform the activity without help   (_3__)Item 9: Sitting on edge of table to supine   0 = cannot perform the activity 1 = can perform the activity with much help 2 = can perform the activity with little help 3 = can perform the activity without help   (_3__)Item 10: Sit-to-stand (without any support, no other constraints)   0 = cannot perform the activity 1 = can perform the activity with much help 2 = can perform the activity with little help 3 = can perform the activity without  help   (_3__)Item 11: Stand-to-sit (without any support, no other constraints)   0 = cannot perform the activity 1 = can perform the activity with much help 2 = can perform the activity with little help 3 = can perform the activity without help   (_2__)Item 12: Standing, picking up a pencil from the floor (without any support, no other constraints)   0 = cannot perform the activity 1 = can perform the activity with much help 2 = can perform the activity with little help 3 = can perform the activity without help   Total: 30/ 36  Extremity Assessment      RLE Assessment RLE Assessment: Exceptions to [facility] RLE Strength Right Hip Flexion: 2+/5 Right Hip Extension: 3-/5 Right Hip ABduction: 2+/5 Right Hip ADduction: 3-/5 Right Knee Flexion: 2/5 Right Knee Extension: 3+/5 Right Ankle Dorsiflexion: 0/5 Right Ankle Plantar Flexion: 0/5 LLE Assessment LLE Assessment: Within Functional Limits General Strength Comments: 5/5 globally except    Tawana Scale , PT, DPT, NCS, CSRS 03/23/2021, 3:08 PM

## 2021-03-24 LAB — GLUCOSE, CAPILLARY: Glucose-Capillary: 113 mg/dL — ABNORMAL HIGH (ref 70–99)

## 2021-03-24 NOTE — Progress Notes (Signed)
Inpatient Rehabilitation Care Coordinator Discharge Note  The overall goal for the admission was met for:   Discharge location: Yes, home  Length of Stay: Yes, 23 Days  Discharge activity level: Yes  Home/community participation: Yes  Services provided included: MD, RD, PT, OT, SLP, RN, CM, TR, Pharmacy, Neuropsych, and SW  Financial Services: Private Insurance: [employer] offered to/list presented [id number] and daughters  Follow-up services arranged: Outpatient: [facility]  Comments (or additional information): PT OT Rolling Walker, Producer, television/film/video, Bedside Commode  Patient/Family verbalized understanding of follow-up arrangements: Yes  Individual responsible for coordination of the follow-up plan: Randell Patient [phone]  Confirmed correct DME delivered: Dyanne Iha 03/24/2021    Dyanne Iha

## 2021-03-24 NOTE — Progress Notes (Signed)
Patient alert and oriented to place, time situation appropriately, walked patient to the restroom  Dan, PA at bedside with education, family present and verbalized understanding of all discharge instructions, NT Jenna at bedside to take patient out to family car, no ss of distress

## 2021-03-24 NOTE — Progress Notes (Addendum)
PROGRESS NOTE   Subjective/Complaints: Pt excited about discharge  No issues overnite  ROS:  Pt denies SOB, abd pain, CP, N/V/C/D, and vision changes  Objective:   No results found. No results for input(s): WBC, HGB, HCT, PLT in the last 72 hours.  No results for input(s): NA, K, CL, CO2, GLUCOSE, BUN, CREATININE, CALCIUM in the last 72 hours.   Intake/Output Summary (Last 24 hours) at 03/24/2021 0857 Last data filed at 03/23/2021 1935 Gross per 24 hour  Intake 238 ml  Output --  Net 238 ml         Physical Exam: Vital Signs Blood pressure 110/62, pulse 77, temperature 98.2 F (36.8 C), resp. rate 17, height 5\' 3"  (1.6 m), weight 90 kg, SpO2 99 %.  General: No acute distress Mood and affect are appropriate Heart: Regular rate and rhythm no rubs murmurs or extra sounds Lungs: Clear to auscultation, breathing unlabored, no rales or wheezes Abdomen: Positive bowel sounds, soft nontender to palpation, nondistended Extremities: No clubbing, cyanosis, or edema Skin: No evidence of breakdown, no evidence of rash Neurologic: Cranial nerves II through XII intact, motor strength is 5/5 in left deltoid, bicep, tricep, grip, hip flexor, knee extensors, ankle dorsiflexor and plantar flexor 3-/5 RUE bi, tri, 2/5 finger and wrist , 3- R thumb flexion and 3/5 R hip /knee ext synergy, 0/5 distal RLE--stable exam Sensory exam normal sensation to light touch and proprioception in bilateral upper and lower extremities Neg mystagmus  Musculoskeletal: normal ROM, no pain    Assessment/Plan: 1. Functional deficits due to CVA Stable for D/C today F/u PCP in 3-4 weeks F/u PM&R 2 weeks See D/C summary See D/C instructions   Care Tool:  Bathing    Body parts bathed by patient: Right arm, Chest, Abdomen, Right upper leg, Left upper leg, Face, Buttocks, Front perineal area, Left lower leg   Body parts bathed by helper: Left arm,  Right lower leg Body parts n/a: Left arm, Right lower leg   Bathing assist Assist Level: Minimal Assistance - Patient > 75%     Upper Body Dressing/Undressing Upper body dressing   What is the patient wearing?: Pull over shirt, Bra    Upper body assist Assist Level: Moderate Assistance - Patient 50 - 74%    Lower Body Dressing/Undressing Lower body dressing      What is the patient wearing?: Underwear/pull up, Pants     Lower body assist Assist for lower body dressing: Minimal Assistance - Patient > 75%     Toileting Toileting    Toileting assist Assist for toileting: Minimal Assistance - Patient > 75%     Transfers Chair/bed transfer  Transfers assist     Chair/bed transfer assist level: Contact Guard/Touching assist Chair/bed transfer assistive device: Walker,   Ambulation assist      Assist level: Contact Guard/Touching assist Assistive device: Walker-rolling Max distance: 217ft   Walk 10 feet activity   Assist     Assist level: Contact Guard/Touching assist Assistive device: Walker-rolling   Walk 50 feet activity   Assist Walk 50 feet with 2 turns activity did not occur: Safety/medical concerns  Assist level:  Contact Guard/Touching assist Assistive device: Walker-rolling    Walk 150 feet activity   Assist Walk 150 feet activity did not occur: Safety/medical concerns  Assist level: Contact Guard/Touching assist Assistive device: Walker-rolling    Walk 10 feet on uneven surface  activity   Assist Walk 10 feet on uneven surfaces activity did not occur: Safety/medical concerns   Assist level: Contact Guard/Touching assist Assistive device: Photographer Will patient use wheelchair at discharge?: No   Wheelchair activity did not occur: N/A         Wheelchair 50 feet with 2 turns activity    Assist    Wheelchair 50 feet with 2 turns activity did not occur:  N/A       Wheelchair 150 feet activity     Assist  Wheelchair 150 feet activity did not occur: N/A       Blood pressure 110/62, pulse 77, temperature 98.2 F (36.8 C), resp. rate 17, height 5\' 3"  (1.6 m), weight 90 kg, SpO2 99 %.  Medical Problem List and Plan: 1.  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  Impaired mobility -DVT/anticoagulation: Lovenox             -antiplatelet therapy: Continue Aspirin 81 mg daily and Plavix 75 mg daily x3 weeks and aspirin alone D/C plavix 7/26 3. Pain Management: N/A 4. Mood: Provide emotional support             -antipsychotic agents: N/A 5. Neuropsych: This patient is capable of making decisions on her own behalf. 6. Skin/Wound Care: Routine skin checks 7. Fluids/Electrolytes/Nutrition: po fluids low, will restart IVF at noc  8.  New findings diabetes mellitus.  Hemoglobin A1c 7.4.  d/c glypizide given good CBG control/hypoglycemic to 65 before lunch. Discussed minimizing added sugar and bread/pasta/rice- she is already doing. Diabetic teaching. CBGs improved. Provided list of foods good for diabetes.  CBG (last 3)  Recent Labs    03/23/21 1153 03/23/21 2123 03/24/21 0600  GLUCAP 181* 129* 113*   Now off glipizide controlled 7/29 9.  Hypertension.  Well controlled.  Vitals:   03/23/21 2014 03/24/21 0402  BP: 119/74 110/62  Pulse: 89 77  Resp: 17 17  Temp: 98 F (36.7 C) 98.2 F (36.8 C)  SpO2: 100% 99%   Controlled 7/29 off lisinopril 10.  Hyperlipidemia.  Continue Lipitor 11.  Thyroid nodule.  Follow-up outpatient 12. AKI: encouraged 6-8 glasses of water per day. Stop metformin. Decrease lisinopril to 2.5mg . Monitor creatinine.  Resolved d/c iv and recheck bmet ok    LOS: 23 days A FACE TO FACE EVALUATION WAS PERFORMED  8/29 03/24/2021, 8:57 AM

## 2021-03-31 ENCOUNTER — Ambulatory Visit: Payer: Medicare Other | Attending: Physician Assistant | Admitting: Physical Therapy

## 2021-03-31 ENCOUNTER — Ambulatory Visit: Payer: Medicare Other | Admitting: Occupational Therapy

## 2021-03-31 ENCOUNTER — Ambulatory Visit: Payer: Medicare Other | Admitting: Speech Pathology

## 2021-03-31 ENCOUNTER — Encounter: Payer: Self-pay | Admitting: Physical Therapy

## 2021-03-31 ENCOUNTER — Encounter: Payer: Self-pay | Admitting: Occupational Therapy

## 2021-03-31 ENCOUNTER — Other Ambulatory Visit: Payer: Self-pay

## 2021-03-31 DIAGNOSIS — I69353 Hemiplegia and hemiparesis 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: Secondary | ICD-10-CM | POA: Diagnosis present

## 2021-03-31 DIAGNOSIS — M6281 Muscle weakness (generalized): Secondary | ICD-10-CM | POA: Insufficient documentation

## 2021-03-31 DIAGNOSIS — R261 Paralytic gait: Secondary | ICD-10-CM | POA: Diagnosis present

## 2021-03-31 DIAGNOSIS — R278 Other lack of coordination: Secondary | ICD-10-CM

## 2021-03-31 DIAGNOSIS — R2681 Unsteadiness on feet: Secondary | ICD-10-CM | POA: Diagnosis present

## 2021-03-31 DIAGNOSIS — I639 Cerebral infarction, unspecified: Secondary | ICD-10-CM | POA: Insufficient documentation

## 2021-03-31 NOTE — Therapy (Signed)
[facility] [address] [place], [place], [address] Phone: [phone]   Fax:  [phone]  Occupational Therapy Evaluation  Patient Details  Name: Carrie Kline MRN: [id number] Date of Birth: 12/29/1945 No data recorded  Encounter Date: 03/31/2021   OT End of Session - 03/31/21 0941     Visit Number 1    Number of Visits 17    Date for OT Re-Evaluation 06/29/21    Authorization Type BCBS Medicare    Progress Note Due on Visit 10    OT Start Time 0932    OT Stop Time 1015    OT Time Calculation (min) 43 min    Activity Tolerance Patient tolerated treatment well    Behavior During Therapy WFL for tasks assessed/performed             Past Medical History:  Diagnosis Date   Gout    Seasonal allergies     Past Surgical History:  Procedure Laterality Date   ABDOMINAL HYSTERECTOMY     BACK SURGERY     GANGLION CYST EXCISION      There were no vitals filed for this visit.   Subjective Assessment - 03/31/21 0943     Subjective  Pt arrives to session w/ primary concerns related to residual R-sided weakness in UE and LE s/p CVA on 02/24/21. Pt states she was admitted to inpatient rehab on 03/01/21 and recently was d/c home about a week ago. Per pt report, she enjoys doing things for herself; "anything I can do own my own, I will."    Patient is accompanied by: Family member   Daughter [name]   Pertinent History Hx of gout and back surgery    Patient Stated Goals "Moving my R arm"    Currently in Pain? No/denies             [facility] OT Assessment - 03/31/21 0945       Assessment   Medical Diagnosis Brainstem cerebral infarction    Referring Provider (OT) Mariam Dollar, PA-C    Onset Date/Surgical Date 02/24/21    Hand Dominance Left    Next MD Visit 04/03/21    Prior Therapy IP rehab (7/6-7/26/22)      Precautions   Precautions None    Required Braces or Orthoses Other Brace/Splint    Other Brace/Splint RLE AFO       Balance Screen   Has the patient fallen in the past 6 months Yes    How many times? 1   in April 2022     Home  Environment   Type of Home House    Home Layout Two level    Bathroom Shower/Tub Tub/Shower unit    Home Equipment Tub bench;Bedside commode;Hand held shower head;Walker - 2 wheels    Lives With Family   Daughter and her family     Prior Function   Level of Independence Independent   Living alone   Vocation Retired    Conservation officer, historic buildings for childcare facility    Leisure shopping      ADL   Eating/Feeding Needs assist with cutting food    Grooming Modified independent    Upper Body Bathing Set up    Lower Body Bathing Set up    Upper Body Dressing Needs assist for fasteners    Lower Body Dressing Minimal assistance   Requires assist w/ sock/shoe on R side   Geologist, engineering Details (  indicate cue type and reason SPV w/ ambulation    Toileting - Clothing Manipulation Modified independent    Toileting -  Clinical research associate Details (indicate cue type and reason Requires assist w/ R leg    Systems developer bench      IADL   Prior Level of Function Shopping Independent    Shopping Needs to be accompanied on any shopping trip   Due to gait instability/limitations   Prior Level of Function Light Housekeeping Independent    Light Housekeeping Needs help with all home maintenance tasks    Prior Level of Function Meal Prep Independent    Meal Prep Able to complete simple cold meal and snack prep;Able to complete simple warm meal prep    Prior Level of Function Community Mobility Not previously driving    Medication Management Is responsible for taking medication in correct dosages at correct time      Mobility   Mobility Status Needs assist    Mobility Status Comments RW      Vision Assessment   Vision Assessment Vision  not tested   To be further assessed in functional context     Activity Tolerance   Activity Tolerance Comments Pt reports feeling fatigued/decreased endurance since d/c from hospital      Cognition   Overall Cognitive Status Within Functional Limits for tasks assessed      Sensation   Light Touch Appears Intact      Coordination   Gross Motor Movements are Fluid and Coordinated No    Fine Motor Movements are Fluid and Coordinated No    Finger Nose Finger Test Impaired w/ RUE    9 Hole Peg Test Right;Left    Right 9 Hole Peg Test 4 pegs in >1 min; unable to complete    Left 9 Hole Peg Test 23 sec    Box and Blocks 21 blocks w/ RUE      Tone   Assessment Location Right Upper Extremity;Other (comment)   To be further assessed     ROM / Strength   AROM / PROM / Strength AROM;Strength      AROM   AROM Assessment Site Shoulder;Elbow;Forearm;Wrist    Right/Left Shoulder Right    Right Shoulder Extension 25 Degrees    Right Shoulder Flexion 28 Degrees   w/ cues to prevent shoulder elevation   Right Shoulder ABduction 32 Degrees    Right/Left Elbow Right    Right Elbow Flexion 118    Right Elbow Extension 52   52* of flex when attempting elbow ext   Right/Left Forearm Right    Right Forearm Supination 43 Degrees    Right/Left Wrist Right    Right Wrist Extension 35 Degrees    Right Wrist Flexion 36 Degrees      Strength   Overall Strength Deficits   Unable to assess R shoulder appropriately due to decreased ROM/tone     Hand Function   Right Hand Gross Grasp Impaired    Left Hand Gross Grasp Functional             OT Education - 03/31/21 0941     Education Details Education provided on role and purpose of OP OT, as well as potential interventions and goals for therapy.    Person(s) Educated Patient;Child(ren)    Methods Explanation    Comprehension Verbalized understanding  OT Short Term Goals - 03/31/21 1151       OT SHORT TERM GOAL #1   Title  Pt will improve independence w/ functional FM tasks (e.g., clothing manipulatives) as evidenced by being able to complete 9HPT w/ R hand    Baseline Completed 4 pegs in >1 min w R hand    Time 4    Period Weeks    Status New    Target Date 04/28/21      OT SHORT TERM GOAL #2   Title Pt will improve AROM of R shoulder flexion by at least 20 degrees to facilitate improved functional reach    Baseline R shoulder flexion 28 degrees    Time 4    Period Weeks    Status New      OT SHORT TERM GOAL #3   Title Pt will improve AROM of R elbow extension by at least 15 degrees to facilitate improved functional reach    Baseline Achieves 52 degrees of flexion when attempting elbow extension    Time 4    Period Weeks    Status New      OT SHORT TERM GOAL #4   Title Pt will demonstrate R hand grip to within 10# of L hand to improve independence w/ IADLs    Baseline TBD    Time 4    Period Weeks    Status New             OT Long Term Goals - 03/31/21 1152       OT LONG TERM GOAL #1   Title Pt will be able to cut food w/ Mod I, using AE/compensatory strategies prn, 100% of the time    Baseline Frequently requires assist cutting food    Time 8    Period Weeks    Status New    Target Date 05/26/21      OT LONG TERM GOAL #2   Title Pt will be able to fasten/unfasten clothing manipulatives in 5/5 trials w/ Mod I, using AE prn    Baseline Requires assist w/ fasteners    Time 8    Period Weeks    Status New      OT LONG TERM GOAL #3   Title Pt will demonstrate simulated tub/shower transfer safely w/ Mod I    Baseline Min A w/ tub/shower transfer    Time 8    Period Weeks    Status New      OT LONG TERM GOAL #4   Title Pt will demonstrate independence w/ HEP designed for coordination, ROM, strength, and functional use of RUE by d/c    Baseline No HEP at this time    Time 8    Period Weeks    Status New             Plan - 03/31/21 1043     Clinical Impression Statement  Pt is a 75 y/o female who presents to OP OT due to residual R-sided weakness s/p CVA on 02/24/21. Prior to onset, pt was living alone and working as an Production designer, theatre/television/filmadministrator at a childcare center. She now lives with her daughter [name](Charlene) and has good add'l family support. No significant PMH. Pt will benefit from skilled occupational therapy services to address strength and coordination, ROM, joint protection strategies, continued condition-specific education, FMC, introduction of compensatory strategies/AE prn, and implementation of an HEP to improve participation, safety, and desired level of independence.    OT Occupational Profile  and History Detailed Assessment- Review of Records and additional review of physical, cognitive, psychosocial history related to current functional performance    Occupational performance deficits (Please refer to evaluation for details): ADL's;IADL's;Work;Leisure;Social Participation    Body Structure / Function / Physical Skills ADL;ROM;UE functional use;Mobility;FMC;Decreased knowledge of use of DME;Balance;Body mechanics;Dexterity;Gait;Vision;GMC;Strength;Endurance;Coordination;IADL;Tone    Rehab Potential Good    Clinical Decision Making Several treatment options, min-mod task modification necessary    Comorbidities Affecting Occupational Performance: May have comorbidities impacting occupational performance    Modification or Assistance to Complete Evaluation  Min-Moderate modification of tasks or assist with assess necessary to complete eval    OT Frequency 2x / week    OT Duration 8 weeks    OT Treatment/Interventions Self-care/ADL training;Electrical Stimulation;Therapeutic exercise;Visual/perceptual remediation/compensation;Patient/family education;Splinting;Neuromuscular education;Moist Heat;Aquatic Therapy;Biofeedback;Energy conservation;Building services engineer;Therapeutic activities;Balance training;Passive range of motion;Manual Therapy;DME and/or AE  instruction;Ultrasound;Cryotherapy    Plan Review goals w/ pt; exercises for RUE ROM (shoulder, elbow) and initiate HEP prn    Consulted and Agree with Plan of Care Patient;Family member/caregiver    Family Member Consulted Kim (daughter)             Patient will benefit from skilled therapeutic intervention in order to improve the following deficits and impairments:   Body Structure / Function / Physical Skills: ADL, ROM, UE functional use, Mobility, FMC, Decreased knowledge of use of DME, Balance, Body mechanics, Dexterity, Gait, Vision, GMC, Strength, Endurance, Coordination, IADL, Tone   Visit Diagnosis: 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 [place], [place], [address] Phone: [phone]   Fax:  [phone]  Name: Carrie Kline MRN: [id number] Date of Birth: [birth year]

## 2021-03-31 NOTE — Therapy (Signed)
[facility] [address] [place], [place], [address] Phone: [phone]   Fax:  [phone]  Physical Therapy Evaluation  Patient Details  Name: Carrie Kline MRN: [id number] Date of Birth: Aug 29, 1945 Referring Provider (PT): Charlton Amor, PA-C   Encounter Date: 03/31/2021   PT End of Session - 03/31/21 1016     Visit Number 1    Number of Visits 17    Date for PT Re-Evaluation 05/26/21    Authorization Type BCBS    PT Start Time 0847    PT Stop Time 0922    PT Time Calculation (min) 35 min    Equipment Utilized During Treatment Gait belt    Activity Tolerance Patient tolerated treatment well    Behavior During Therapy WFL for tasks assessed/performed             Past Medical History:  Diagnosis Date   Gout    Seasonal allergies     Past Surgical History:  Procedure Laterality Date   ABDOMINAL HYSTERECTOMY     BACK SURGERY     GANGLION CYST EXCISION      There were no vitals filed for this visit.    Subjective Assessment - 03/31/21 0849     Subjective Patient reports experiencing a stroke on 02/24/21. Underwent inpatient rehab from 07/06-07/26. Wearing R AFO which she got at the hospital. Has been using RW inside and outside. Notes remaining R sided weakness in the UE and LE. Does have trouble with her balance but denies dizziness. Lives with her daughter [name]- requires some assistance with dressing. Daughter helps with cooking/cleaning. Patient has been practicing walking on the porch at home. Denies R body pain or N/T.    Patient is accompained by: Family member   daughters   Pertinent History gout, back surgery    Limitations Walking;House hold activities;Lifting;Standing    How long can you stand comfortably? ~3 min    How long can you walk comfortably? 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 No/denies                [facility] PT Assessment - 03/31/21 0856       Assessment   Medical Diagnosis Other cerebral infarction    Referring Provider (PT) Charlton Amor, PA-C    Onset Date/Surgical Date 02/24/21    Hand Dominance Left    Next MD Visit 04/03/21    Prior Therapy yes- inpatient rehab      Precautions   Precautions None      Balance Screen   Has the patient fallen in the past 6 months Yes    How many times? 1    Has the patient had a decrease in activity level because of a fear of falling?  No    Is the patient reluctant to leave their home because of a fear of falling?  No      Home Environment   Living Environment Private residence    Living Arrangements Children   daughter provides 24/7 supervision for the next 30 days   Available Help at Discharge Family    Type of Home [facility] Access Stairs  to enter    Entrance Stairs-Number of Steps 1    Entrance Stairs-Rails None    Home Layout Two level    Alternate Level Stairs-Number of Steps 13    Alternate Level Stairs-Rails Right    Home Equipment Walker - 2 wheels;Cane - single point;Bedside commode;Shower seat   tub shower     Prior Function   Level of Independence Independent with basic ADLs;Independent with household mobility with device    Vocation Retired    Leisure shopping      Cognition   Overall Cognitive Status Within Functional Limits for tasks assessed      Sensation   Light Touch Appears Intact      Coordination   Gross Motor Movements are Fluid and Coordinated No      Posture/Postural Control   Posture/Postural Control Postural limitations    Postural Limitations Rounded Shoulders      ROM / Strength   AROM / PROM / Strength AROM;Strength      AROM   AROM Assessment Site Ankle    Right/Left Ankle Right;Left    Right  Ankle Dorsiflexion -13    Left Ankle Dorsiflexion 4      Strength   Strength Assessment Site Hip;Knee;Ankle    Right/Left Hip Right;Left    Right Hip Flexion 2+/5    Right Hip ABduction 3+/5    Right Hip ADduction 4-/5    Left Hip Flexion 4+/5    Left Hip ABduction 4/5    Left Hip ADduction 4/5    Right/Left Knee Left;Right    Right Knee Flexion 3+/5    Right Knee Extension 4-/5    Left Knee Flexion 4+/5    Left Knee Extension 4+/5    Right/Left Ankle Right;Left    Right Ankle Dorsiflexion 2-/5    Right Ankle Plantar Flexion 3+/5    Left Ankle Dorsiflexion 4+/5    Left Ankle Plantar Flexion 4/5      Ambulation/Gait   Assistive device Rolling walker    Gait Pattern Step-to pattern;Step-through pattern;Decreased step length - left;Decreased stance time - right;Decreased dorsiflexion - right;Decreased weight shift to right;Poor foot clearance - right;Trunk flexed   R eversion heel whip in swing   Ambulation Surface Level;Indoor    Gait velocity decreased      Standardized Balance Assessment   Standardized Balance Assessment Timed Up and Go Test;Five Times Sit to Stand    Five times sit to stand comments  19.61   wiht L armrest     Timed Up and Go Test   Normal TUG (seconds) 28.58   with RW                       Objective measurements completed on examination: See above findings.               PT Education - 03/31/21 0949     Education Details prognosis, POC, HEP- indicated which exercises to perfrom with/without AFO; advised to use counter for safety    Person(s) Educated Patient    Methods Explanation;Demonstration;Tactile cues;Verbal cues;Handout    Comprehension Verbalized understanding              PT Short Term Goals - 03/31/21 1026       PT SHORT TERM GOAL #1   Title Patient to be independent with initial HEP.    Time 3    Period Weeks    Status New    Target Date 04/21/21  PT Long Term Goals - 03/31/21  1026       PT LONG TERM GOAL #1   Title Patient to be independent with advanced HEP.    Time 8    Period Weeks    Status New    Target Date 05/26/21      PT LONG TERM GOAL #2   Title Patient to demonstrate R LE strength >/=4/5.    Time 8    Period Weeks    Status New    Target Date 05/26/21      PT LONG TERM GOAL #3   Title Patient to complete TUG in <14 sec with LRAD in order to decrease risk of falls.    Time 8    Period Weeks    Status New    Target Date 05/26/21      PT LONG TERM GOAL #4   Title Patient to complete 5xSTS in <15 sec in order to decrease risk of falls.    Time 8    Period Weeks    Status New    Target Date 05/26/21      PT LONG TERM GOAL #5   Title Patient to score atleast 47/56 on Berg in order to decrease risk of falls.    Time 8    Period Weeks    Status New    Target Date 05/26/21                    Plan - 03/31/21 1017     Clinical Impression Statement Patient is a 75 y/o F presenting to OPPT with daughters with c/o R sided weakness s/p L pontine stroke on 02/24/21. Patient participated in inpatient rehab 07/06-07/29/22 and was D/C'd with RW and R AFO. Patient lives with one daughter who is providing 24/7 supervision for the next 30 days. Patient reports remaining imbalance but denies R hemi-body pain or N/T. Patient today presenting with limited R ankle dorsiflexion AROM, decreased R LE strength, increased time required for transfers, and gait deviations. Patient's scores on 5xSTS and TUG demonstrated an increased risk of falls. Patient and family were educated on gentle strengthening HEP and reported understanding. Would benefit from skilled PT services 2x/week for 8 weeks to address aforementioned impairments.    Personal Factors and Comorbidities Comorbidity 2;Age;Time since onset of injury/illness/exacerbation;Past/Current Experience    Comorbidities gout, back surgery    Examination-Activity Limitations Bathing;Transfers;Locomotion  Level;Bed Mobility;Bend;Sit;Caring for Others;Carry;Squat;Stairs;Stand;Toileting;Lift;Hygiene/Grooming;Dressing    Examination-Participation Restrictions Church;Meal Prep;Cleaning;Community Activity;Shop;Laundry    Stability/Clinical Decision Making Evolving/Moderate complexity    Clinical Decision Making Moderate    Rehab Potential Good    PT Frequency 2x / week    PT Duration 8 weeks    PT Treatment/Interventions ADLs/Self Care Home Management;Cryotherapy;DME Instruction;Electrical Stimulation;Ultrasound;Moist Heat;Gait training;Stair training;Functional mobility training;Therapeutic activities;Therapeutic exercise;Balance training;Neuromuscular re-education;Patient/family education;Orthotic Fit/Training;Manual techniques;Splinting;Energy conservation;Dry needling;Passive range of motion;Taping;Vasopneumatic Device;Visual/perceptual remediation/compensation    PT Next Visit Plan reassess HEP, assess berg, progress R LE strengthening and balance    Consulted and Agree with Plan of Care Patient;Family member/caregiver    Family Member Consulted 2 daughters             Patient will benefit from skilled therapeutic intervention in order to improve the following deficits and impairments:  Abnormal gait, Decreased coordination, Decreased range of motion, Difficulty walking, Increased fascial restricitons, Increased muscle spasms, Decreased safety awareness, Decreased activity tolerance, Improper body mechanics, Impaired flexibility, Decreased balance, Postural dysfunction, Decreased strength, Decreased mobility  Visit Diagnosis: Hemiplegia and  hemiparesis 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 (HCC)  Paralytic gait  Unsteadiness on feet     Problem List Patient Active Problem List   Diagnosis Date Noted   Brainstem infarct, acute (HCC) 03/01/2021   Acute cerebral infarction (HCC) 02/24/2021   Thyroid nodule 02/24/2021   Diabetes (HCC) 02/24/2021     Anette Guarneri, PT, DPT 03/31/21 10:31 AM    [facility] [address] [place], [place], [address] Phone: [phone]   Fax:  [phone]  Name: Adeja Sarratt MRN: [id number] Date of Birth: Jan 05, 1946

## 2021-03-31 NOTE — Patient Instructions (Signed)
Access Code: [email or web address] URL: [email or web address] Date: 03/31/2021 Prepared by: Georgina Peer  Exercises Seated Toe Raise - 2 x daily - 7 x weekly - 2 sets - 10 reps Sit to Stand with Counter Support - 2 x daily - 7 x weekly - 2 sets - 10 reps Standing March with Counter Support - 2 x daily - 7 x weekly - 2 sets - 10 reps Heel Raises with Counter Support - 2 x daily - 7 x weekly - 2 sets - 10 reps Standing Hamstring Curl with Chair Support - 2 x daily - 7 x weekly - 2 sets - 10 reps

## 2021-04-03 ENCOUNTER — Encounter: Payer: Self-pay | Admitting: Occupational Therapy

## 2021-04-03 ENCOUNTER — Encounter: Payer: Medicare Other | Admitting: Registered Nurse

## 2021-04-03 ENCOUNTER — Encounter: Payer: Self-pay | Admitting: Registered Nurse

## 2021-04-03 ENCOUNTER — Other Ambulatory Visit: Payer: Self-pay

## 2021-04-03 ENCOUNTER — Encounter: Payer: Medicare Other | Attending: Registered Nurse | Admitting: Registered Nurse

## 2021-04-03 ENCOUNTER — Ambulatory Visit: Payer: Medicare Other | Admitting: Occupational Therapy

## 2021-04-03 ENCOUNTER — Ambulatory Visit: Payer: Medicare Other | Admitting: Rehabilitative and Restorative Service Providers"

## 2021-04-03 VITALS — BP 141/88 | HR 92 | Temp 98.9°F | Ht 63.0 in | Wt 193.6 lb

## 2021-04-03 DIAGNOSIS — I69353 Hemiplegia and hemiparesis 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: Secondary | ICD-10-CM

## 2021-04-03 DIAGNOSIS — I6389 Other cerebral infarction: Secondary | ICD-10-CM | POA: Diagnosis present

## 2021-04-03 DIAGNOSIS — R278 Other lack of coordination: Secondary | ICD-10-CM

## 2021-04-03 DIAGNOSIS — M6281 Muscle weakness (generalized): Secondary | ICD-10-CM

## 2021-04-03 NOTE — Patient Instructions (Signed)
Shoulder Shrug    Bring shoulders up toward ears. Hold 2-3 seconds. Relax. Repeat 15 times. Do 2-3 sessions per day.  Supine: Chest Press (Active)    Lie on back with arms fully extended. Lower bar or dowel slowly to chest and press to arm's length. Complete 2 sets of 10 repetitions slowly. Perform 2-3 sessions per day.  Copyright  VHI. All rights reserved.

## 2021-04-03 NOTE — Progress Notes (Signed)
Subjective:    Patient ID: Carrie Kline, female    DOB: 1945/09/29, 75 y.o.   MRN: [id number]  HPI: Carrie Kline is a 75 y.o. 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 She presented to [facility] on 02/24/2021, with acute onset of right sided weakness, had a fall and altered speech, She arrived to [facility] as  code stroke via EMS. Neurology Consulted.  CT Head WO Contrast:  IMPRESSION: 1. Normal for age non contrast CT appearance of the brain. ASPECTS 10.  CT Angio: Head/Neck IMPRESSION: 1. Negative for large vessel occlusion. And no circle-of-Willis branch occlusion identified.   2. Generally mild carotid and anterior circulation atherosclerosis. But more pronounced vertebral and posterior circulation atherosclerosis including: - Moderate stenosis at the origin of the dominant left vertebral artery. - mild to moderate stenoses of the distal PCA branches.   3. A 2 cm left thyroid nodule meets size criteria for further characterization. Recommend Thyroid Ultrasound.   4. Diffuse idiopathic skeletal hyperostosis (DISH).  MR Brain: WO Contrast:  IMPRESSION: Acute infarct in the left pons.  Slight edema without mass effect.  Carrie Kline was maintained on aspirin and Plavix for CVA prophylaxis x 3 weeks then aspirin alone.   Carrie Kline was admitted to inpatient rehabilitation on 03/01/2021 and discharged home on 03/24/2021. She is receiving outpatient Therapy at [facility]. She denies any pain. She rated her pain 0.   Daughter in room.   Pain Inventory Average Pain 0 Pain Right Now 0 My pain is  no pain  LOCATION OF PAIN  No pain here for Stroke follow up  BOWEL Number of stools per week: 7 Oral laxative use Yes  Type of laxative metamucil Enema or suppository use No  History of colostomy No  Incontinent No   BLADDER Normal In and out cath, frequency n/a Able to self cath No  Bladder incontinence No  Frequent urination No   Leakage with coughing No  Difficulty starting stream No  Incomplete bladder emptying No    Mobility walk with assistance use a walker how many minutes can you walk? 3 mins maybe ability to climb steps?  yes do you drive?  no Do you have any goals in this area?  yes  Function employed # of hrs/week On leave from [employer]  I need assistance with the following:  feeding, dressing, bathing, toileting, meal prep, household duties, and shopping Do you have any goals in this area?  yes  Neuro/Psych weakness trouble walking  Prior Studies Any changes since last visit?  no New Patient  Physicians involved in your care Any changes since last visit?  no   Family History  Problem Relation Age of Onset   Heart attack Father    Diabetes Sister    Thyroid disease Daughter    Social History   Socioeconomic History   Marital status: Widowed    Spouse name: Not on file   Number of children: Not on file   Years of education: Not on file   Highest education level: Not on file  Occupational History   Not on file  Tobacco Use   Smoking status: Never   Smokeless tobacco: Never  Vaping Use   Vaping Use: Never used  Substance and Sexual Activity   Alcohol use: No   Drug use: No   Sexual activity: Not on file  Other Topics Concern   Not on file  Social History Narrative  Not on file   Social Determinants of Health   Financial Resource Strain: Not on file  Food Insecurity: Not on file  Transportation Needs: Not on file  Physical Activity: Not on file  Stress: Not on file  Social Connections: Not on file   Past Surgical History:  Procedure Laterality Date   ABDOMINAL HYSTERECTOMY     BACK SURGERY     GANGLION CYST EXCISION     Past Medical History:  Diagnosis Date   Gout    Seasonal allergies    BP (!) 141/88   Pulse 92   Temp 98.9 F (37.2 C)   Ht 5\' 3"  (1.6 m)   Wt 193 lb 9.6 oz (87.8 kg)   SpO2 98%   BMI 34.29 kg/m   Opioid Risk Score:    Fall Risk Score:  `1  Depression screen PHQ 2/9  Depression screen PHQ 2/9 04/03/2021  Decreased Interest 0  Down, Depressed, Hopeless 0  PHQ - 2 Score 0  Altered sleeping 0  Tired, decreased energy 3  Change in appetite 0  Feeling bad or failure about yourself  0  Trouble concentrating 0  Moving slowly or fidgety/restless 0  Suicidal thoughts 0  PHQ-9 Score 3    Review of Systems  Musculoskeletal:  Positive for gait problem.       Right arm  & right leg weakness  Neurological:  Positive for weakness.  All other systems reviewed and are negative.     Objective:   Physical Exam Vitals and nursing note reviewed.  Constitutional:      Appearance: Normal appearance.  Cardiovascular:     Rate and Rhythm: Normal rate and regular rhythm.     Pulses: Normal pulses.     Heart sounds: Normal heart sounds.  Pulmonary:     Effort: Pulmonary effort is normal.     Breath sounds: Normal breath sounds.  Musculoskeletal:     Cervical back: Normal range of motion and neck supple.     Comments: Normal Muscle Bulk and Muscle Testing Reveals:  Upper Extremities: Right: Decreased ROM 45 Degrees and Muscle Strength 3/5 Left: Full ROM and Muscle Strength 5/5  Lower Extremities: Right: Decreased ROM and Muscle Strength 4/5  Wearing AFO Left: Lower Extremity: Full ROM and Muscle Strength 5/5 Arises from Table slowly using walker for support Narrow Based  Gait     Skin:    General: Skin is warm and dry.  Neurological:     Mental Status: She is alert and oriented to person, place, and time.  Psychiatric:        Mood and Affect: Mood normal.        Behavior: Behavior normal.         Assessment & Plan:  brainstem infarct.Continue outpatient therapy at [facility]. Placed a call to [facility], left message for them to call for HFU appointment. She has a scheduled appointment on 05/08/2021.  F/U with Dr 07/08/2021 in 4- 6 weeks

## 2021-04-03 NOTE — Therapy (Signed)
[facility] [address] [place], [place], [address] Phone: [phone]   Fax:  [phone]  Occupational Therapy Treatment  Patient Details  Name: Carrie Kline MRN: [id number] Date of Birth: 05-29-1946 Referring Provider (OT): Mariam Dollar, PA-C   Encounter Date: 04/03/2021   OT End of Session - 04/03/21 0947     Visit Number 2    Number of Visits 17    Date for OT Re-Evaluation 06/29/21    Authorization Type BCBS Medicare    Progress Note Due on Visit 10    OT Start Time [phone]    OT Stop Time 1018    OT Time Calculation (min) 45 min    Activity Tolerance Patient tolerated treatment well    Behavior During Therapy [facility] for tasks assessed/performed             Past Medical History:  Diagnosis Date   Gout    Seasonal allergies     Past Surgical History:  Procedure Laterality Date   ABDOMINAL HYSTERECTOMY     BACK SURGERY     GANGLION CYST EXCISION      There were no vitals filed for this visit.   Subjective Assessment - 04/03/21 0944     Subjective  "You make me feel comfortable; I feel comfortable talking about how I'm feeling"    Patient is accompanied by: Family member   Daughter [name]   Pertinent History Hx of gout and back surgery    Patient Stated Goals "Moving my R arm"    Currently in Pain? No/denies             Treatment/Exercises - 04/03/21    Functional Mobility Min A w/ transition from sitting EOM to supine (assist w/ BLEs); Mod I w/ transition from supine to sitting EOM    Shoulder ROM: Supine Closed-chain BUE shoulder flex w/ unweighted dowel in supine to facilitate scapular alignment w/ retraction and decrease incorporation of atypical compensatory movement patterns; completed 1 set of 15 reps  Closed-chain R shoulder abduction w/ unweighted dowel in supine w/ pillow/towel roll positioned under upper arm for alignment in neutral during exercise; activity also facilitated elbow  extension; 1 set of 15 reps    Shoulder Shrugs Bilateral shoulder shrugs completed sitting eOM w/ tall mirror positioned in front of pt as a visual aid for alignment/decreased compensatory patterns; able to complete 2x10 w/out difficulty     Weight Bearing Light wt bearing in RUE for facilitation of muscle activation; OT provided add'l support at shoulder/elbow each rep for alignment/positioning            OT Education - 04/03/21 0946     Education Details Initiated HEP for RUE functional use, ROM, and scapular stability (see pt instructions). Also provided CVA-specific education including neuro reed and functional recovery patterns    Person(s) Educated Patient    Methods Explanation;Demonstration;Verbal cues;Handout    Comprehension Verbalized understanding;Returned demonstration;Verbal cues required;Need further instruction             OT Short Term Goals - 04/03/21 0947       OT SHORT TERM GOAL #1   Title Pt will improve independence w/ functional FM tasks (e.g., clothing manipulatives) as evidenced by being able to complete 9HPT w/ R hand    Baseline Completed 4 pegs in >1 min w R hand    Time 4    Period Weeks    Status On-going    Target Date 04/28/21  OT SHORT TERM GOAL #2   Title Pt will improve AROM of R shoulder flexion by at least 20 degrees to facilitate improved functional reach    Baseline R shoulder flexion 28 degrees    Time 4    Period Weeks    Status On-going      OT SHORT TERM GOAL #3   Title Pt will improve AROM of R elbow extension by at least 15 degrees to facilitate improved functional reach    Baseline Achieves 52 degrees of flexion when attempting elbow extension    Time 4    Period Weeks    Status On-going      OT SHORT TERM GOAL #4   Title Pt will demonstrate R hand grip to within 10# of L hand to improve independence w/ IADLs    Baseline TBD    Time 4    Period Weeks    Status On-going             OT Long Term Goals -  04/03/21 0949       OT LONG TERM GOAL #1   Title Pt will be able to cut food w/ Mod I, using AE/compensatory strategies prn, 100% of the time    Baseline Frequently requires assist cutting food    Time 8    Period Weeks    Status On-going      OT LONG TERM GOAL #2   Title Pt will be able to fasten/unfasten clothing manipulatives in 5/5 trials w/ Mod I, using AE prn    Baseline Requires assist w/ fasteners    Time 8    Period Weeks    Status On-going      OT LONG TERM GOAL #3   Title Pt will demonstrate simulated tub/shower transfer safely w/ Mod I    Baseline Min A w/ tub/shower transfer    Time 8    Period Weeks    Status On-going      OT LONG TERM GOAL #4   Title Pt will demonstrate independence w/ HEP designed for coordination, ROM, strength, and functional use of RUE by d/c    Baseline No HEP at this time    Time 8    Period Weeks    Status On-going             Plan - 04/03/21 0949     Clinical Impression Statement Reviewed all goals w/ pt and she was agreeable to POC at this time. Pt also expressed that while she feels happy that the residual deficits appear to not be as severe as they could have been, she frequently still feels frustrated and inpatient w/ recovery process; OT provided encouragement and provided relevant condition-specific education prn. HEP initiated this session w/ emphasis on avoiding compensatory movement patterns, facilitating appropriate alignment, and not moving past point of pain/discomfort.    OT Occupational Profile and History Detailed Assessment- Review of Records and additional review of physical, cognitive, psychosocial history related to current functional performance    Occupational performance deficits (Please refer to evaluation for details): ADL's;IADL's;Work;Leisure;Social Participation    Body Structure / Function / Physical Skills ADL;ROM;UE functional use;Mobility;FMC;Decreased knowledge of use of DME;Balance;Body  mechanics;Dexterity;Gait;Vision;GMC;Strength;Endurance;Coordination;IADL;Tone    Rehab Potential Good    Clinical Decision Making Several treatment options, min-mod task modification necessary    Comorbidities Affecting Occupational Performance: May have comorbidities impacting occupational performance    Modification or Assistance to Complete Evaluation  Min-Moderate modification of tasks or assist with assess  necessary to complete eval    OT Frequency 2x / week    OT Duration 8 weeks    OT Treatment/Interventions Self-care/ADL training;Electrical Stimulation;Therapeutic exercise;Visual/perceptual remediation/compensation;Patient/family education;Splinting;Neuromuscular education;Moist Heat;Aquatic Therapy;Biofeedback;Energy conservation;Building services engineer;Therapeutic activities;Balance training;Passive range of motion;Manual Therapy;DME and/or AE instruction;Ultrasound;Cryotherapy    Plan Continue w/ exercises for RUE ROM (shoulder, elbow); scapular stabilization and alignment    Consulted and Agree with Plan of Care Patient;Family member/caregiver    Family Member Consulted Kim (daughter)             Patient will benefit from skilled therapeutic intervention in order to improve the following deficits and impairments:   Body Structure / Function / Physical Skills: ADL, ROM, UE functional use, Mobility, FMC, Decreased knowledge of use of DME, Balance, Body mechanics, Dexterity, Gait, Vision, GMC, Strength, Endurance, Coordination, IADL, Tone   Visit Diagnosis: 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)  Muscle weakness (generalized)  Other lack of coordination    Problem List Patient Active Problem List   Diagnosis Date Noted   Brainstem infarct, acute (HCC) 03/01/2021   Acute cerebral infarction (HCC) 02/24/2021   Thyroid nodule 02/24/2021   Diabetes (HCC) 02/24/2021     Rosie Fate, OTR/L, MSOT 04/03/2021,  3:43 PM  [facility] [address] [place], [place], [address] Phone: [phone]   Fax:  [phone]  Name: Carrie Kline MRN: [id number] Date of Birth: 10-31-1945

## 2021-04-03 NOTE — Patient Instructions (Addendum)
If you don't hear from [facility] by Wednesday 04/05/2021.  Call [place]: [phone]    [facility]: [phone] ( New Patient)

## 2021-04-04 ENCOUNTER — Ambulatory Visit (INDEPENDENT_AMBULATORY_CARE_PROVIDER_SITE_OTHER): Payer: Medicare Other | Admitting: Family Medicine

## 2021-04-04 ENCOUNTER — Encounter: Payer: Self-pay | Admitting: Family Medicine

## 2021-04-04 VITALS — BP 133/83 | HR 90 | Temp 97.9°F | Resp 15 | Ht 62.99 in | Wt 190.8 lb

## 2021-04-04 DIAGNOSIS — E7849 Other hyperlipidemia: Secondary | ICD-10-CM | POA: Diagnosis not present

## 2021-04-04 DIAGNOSIS — Z7689 Persons encountering health services in other specified circumstances: Secondary | ICD-10-CM

## 2021-04-04 DIAGNOSIS — I69320 Aphasia following cerebral infarction: Secondary | ICD-10-CM

## 2021-04-04 DIAGNOSIS — E119 Type 2 diabetes mellitus without complications: Secondary | ICD-10-CM

## 2021-04-04 DIAGNOSIS — Z09 Encounter for follow-up examination after completed treatment for conditions other than malignant neoplasm: Secondary | ICD-10-CM | POA: Diagnosis not present

## 2021-04-04 MED ORDER — ATORVASTATIN CALCIUM 40 MG PO TABS: 40.0000 mg | ORAL_TABLET | Freq: Every day | ORAL | 1 refills | Status: DC

## 2021-04-04 NOTE — Progress Notes (Signed)
Pt presents for hospital follow-up 07/06-07/29 reports that she feels fine just experiencing extreme fatigue. Pt having anxiety about new diagnosed diabetes  Needs refill on atorvastatin

## 2021-04-04 NOTE — Progress Notes (Signed)
New Patient Office Visit  Subjective:  Patient ID: Carrie Kline, female    DOB: November 11, 1945  Age: 75 y.o. MRN: [id number]  CC:  Chief Complaint  Patient presents with   Hospitalization Follow-up    HPI Carrie Kline presents for to establish care.  Patient is also here for hospital follow-up following a CVA on July 1.  She was recently discharged on July 29.  In the hospital she was diagnosed with diabetes and placed on medications which she has now been taking off of.  She is doing well with diet management only.  She is also having occupational and physical therapies to improve right-sided weakness.  She reports great improvement since initial diagnosis.  She is accompanied by her daughter who is now providing help with her care.  Her daughter also request for FMLA paperwork to be completed on her behalf.  Past Medical History:  Diagnosis Date   Gout    Seasonal allergies     Past Surgical History:  Procedure Laterality Date   ABDOMINAL HYSTERECTOMY     BACK SURGERY     GANGLION CYST EXCISION      Family History  Problem Relation Age of Onset   Heart attack Father    Diabetes Sister    Thyroid disease Daughter     Social History   Socioeconomic History   Marital status: Widowed    Spouse name: Not on file   Number of children: Not on file   Years of education: Not on file   Highest education level: Not on file  Occupational History   Not on file  Tobacco Use   Smoking status: Never   Smokeless tobacco: Never  Vaping Use   Vaping Use: Never used  Substance and Sexual Activity   Alcohol use: No   Drug use: No   Sexual activity: Not on file  Other Topics Concern   Not on file  Social History Narrative   Not on file   Social Determinants of Health   Financial Resource Strain: Not on file  Food Insecurity: Not on file  Transportation Needs: Not on file  Physical Activity: Not on file  Stress: Not on file  Social Connections: Not on file  Intimate  Partner Violence: Not on file    ROS Review of Systems  Neurological:  Positive for weakness. Negative for dizziness, facial asymmetry and speech difficulty.  All other systems reviewed and are negative.  Objective:   Today's Vitals: BP 133/83 (BP Location: Left Arm, Patient Position: Sitting, Cuff Size: Large)   Pulse 90   Temp 97.9 F (36.6 C)   Resp 15   Ht 5' 2.99" (1.6 m)   Wt 190 lb 12.8 oz (86.5 kg)   SpO2 98%   BMI 33.81 kg/m   Physical Exam Vitals and nursing note reviewed.  Constitutional:      General: She is not in acute distress. HENT:     Head: Normocephalic and atraumatic.  Cardiovascular:     Rate and Rhythm: Normal rate and regular rhythm.  Pulmonary:     Effort: Pulmonary effort is normal.     Breath sounds: Normal breath sounds.  Abdominal:     Palpations: Abdomen is soft.     Tenderness: There is no abdominal tenderness.  Musculoskeletal:     Comments: Utilizing rolling walker  Neurological:     Mental Status: She is alert and oriented to person, place, and time.     Motor: Weakness (right-sided) present.  Psychiatric:        Mood and Affect: Mood normal.        Behavior: Behavior normal.    Assessment & Plan:   Problem List Items Addressed This Visit   None Visit Diagnoses     Aphasia S/P CVA    -  Primary   continue therapies and follow up with consultant - FMLA paperwork completed for daughter   Relevant Medications   atorvastatin (LIPITOR) 40 MG tablet   Diet-controlled diabetes mellitus (HCC)       doing well with dietary management at this time. Monitor   Relevant Medications   atorvastatin (LIPITOR) 40 MG tablet   Other hyperlipidemia       continue present management   Relevant Medications   atorvastatin (LIPITOR) 40 MG tablet   Hospital discharge follow-up       Encounter to establish care           Outpatient Encounter Medications as of 04/04/2021  Medication Sig   aspirin EC 81 MG EC tablet Take 1 tablet (81 mg total)  by mouth daily. Swallow whole.   [DISCONTINUED] atorvastatin (LIPITOR) 40 MG tablet Take 1 tablet (40 mg total) by mouth daily.   atorvastatin (LIPITOR) 40 MG tablet Take 1 tablet (40 mg total) by mouth daily.   No facility-administered encounter medications on file as of 04/04/2021.    Follow-up: Return in about 3 months (around 07/05/2021) for chronic med issues.   Tommie Raymond, MD

## 2021-04-12 ENCOUNTER — Ambulatory Visit: Payer: Medicare Other

## 2021-04-12 ENCOUNTER — Other Ambulatory Visit: Payer: Self-pay

## 2021-04-12 ENCOUNTER — Encounter: Payer: Self-pay | Admitting: Occupational Therapy

## 2021-04-12 ENCOUNTER — Ambulatory Visit: Payer: Medicare Other | Admitting: Occupational Therapy

## 2021-04-12 DIAGNOSIS — I69353 Hemiplegia and hemiparesis 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: Secondary | ICD-10-CM | POA: Diagnosis not present

## 2021-04-12 DIAGNOSIS — M6281 Muscle weakness (generalized): Secondary | ICD-10-CM

## 2021-04-12 DIAGNOSIS — R278 Other lack of coordination: Secondary | ICD-10-CM

## 2021-04-12 DIAGNOSIS — R261 Paralytic gait: Secondary | ICD-10-CM

## 2021-04-12 DIAGNOSIS — R2681 Unsteadiness on feet: Secondary | ICD-10-CM

## 2021-04-12 NOTE — Therapy (Signed)
[facility] [address]. [place], [place], [address] Phone: [phone]   Fax:  [phone]  Physical Therapy Treatment  Patient Details  Name: Carrie Kline MRN: [id number] Date of Birth: 1946/05/29 Referring Provider (PT): Charlton Amor, PA-C   Encounter Date: 04/12/2021   PT End of Session - 04/12/21 1345     Visit Number 2    Date for PT Re-Evaluation 05/26/21    Authorization Type BCBS    PT Start Time 1300    PT Stop Time 1345    PT Time Calculation (min) 45 min    Equipment Utilized During Treatment Gait belt    Activity Tolerance Patient tolerated treatment well    Behavior During Therapy WFL for tasks assessed/performed             Past Medical History:  Diagnosis Date   Gout    Seasonal allergies     Past Surgical History:  Procedure Laterality Date   ABDOMINAL HYSTERECTOMY     BACK SURGERY     GANGLION CYST EXCISION      There were no vitals filed for this visit.   Subjective Assessment - 04/12/21 1301     Subjective Denies any falls sinc eprevious, doing okay.    Patient Stated Goals be able to walk without AD    Currently in Pain? No/denies                               [facility] Adult PT Treatment/Exercise - 04/12/21 0001       Ambulation/Gait   Assistive device Rolling walker   adjusted height of walker 1 notch - decreased forward trunk flexion once adjusted     Standardized Balance Assessment   Standardized Balance Assessment Berg Balance Test      Berg Balance Test   Sit to Stand Able to stand  independently using hands    Standing Unsupported Able to stand 2 minutes with supervision    Sitting with Back Unsupported but Feet Supported on Floor or Stool Able to sit safely and securely 2 minutes    Stand to Sit Sits safely with minimal use of hands    Transfers Able to transfer safely, definite need of hands    Standing Unsupported with Eyes Closed Able to  stand 10 seconds safely    Standing Ubsupported with Feet Together Needs help to attain position but able to stand for 30 seconds with feet together    From Standing, Reach Forward with Outstretched Arm Can reach forward >5 cm safely (2")    From Standing Position, Pick up Object from Floor Able to pick up shoe, needs supervision    From Standing Position, Turn to Look Behind Over each Shoulder Turn sideways only but maintains balance    Turn 360 Degrees Needs close supervision or verbal cueing   unstable but able to complete CS- CGA   Standing Unsupported, Alternately Place Feet on Step/Stool Needs assistance to keep from falling or unable to try   able to complete in 40 seconds with BUE support   Standing Unsupported, One Foot in [employer] balance while stepping or standing    Standing on One Leg Unable to try or needs assist to prevent fall    Total Score 30      High Level Balance   High Level Balance Activities Side stepping    bars BUE support  Exercises   Exercises Lumbar;Knee/Hip      Lumbar Exercises: Supine   Bridge 10 reps   3 sets, cueing for right hip ext   Other Supine Lumbar Exercises ball squeeze 3 x 10, 3" holds   hooklying   Other Supine Lumbar Exercises marches B alt 2 x 10. oblique/core activatio with flexion lifting right shoulder and head off of mat table x 10      Knee/Hip Exercises: Seated   Other Seated Knee/Hip Exercises standing cone taps x 10 each side wuth FWW    Sit to Sand 2 sets;10 reps   1 set at standard chair hands on thighs, 2nd set at elevated mat table with PT facilitation for WS right                     PT Short Term Goals - 03/31/21 1026       PT SHORT TERM GOAL #1   Title Patient to be independent with initial HEP.    Time 3    Period Weeks    Status New    Target Date 04/21/21               PT Long Term Goals - 03/31/21 1026       PT LONG TERM GOAL #1   Title Patient to be independent with advanced HEP.     Time 8    Period Weeks    Status New    Target Date 05/26/21      PT LONG TERM GOAL #2   Title Patient to demonstrate R LE strength >/=4/5.    Time 8    Period Weeks    Status New    Target Date 05/26/21      PT LONG TERM GOAL #3   Title Patient to complete TUG in <14 sec with LRAD in order to decrease risk of falls.    Time 8    Period Weeks    Status New    Target Date 05/26/21      PT LONG TERM GOAL #4   Title Patient to complete 5xSTS in <15 sec in order to decrease risk of falls.    Time 8    Period Weeks    Status New    Target Date 05/26/21      PT LONG TERM GOAL #5   Title Patient to score atleast 47/56 on Berg in order to decrease risk of falls.    Time 8    Period Weeks    Status New    Target Date 05/26/21                   Plan - 04/12/21 1347     Clinical Impression Statement BERG assessed today and pt currently fall risk 30/56. Fatigue a limiting factor during session requriring rest breaks Intermittent as needed. Pt does demonstrate alot of extension throughout right side, difficulty breaking that up in standing so initiated some supine strengthening in hooklying to break up the extension and focus on symmetrical movement, RLE stabilization. FWW height also adjusted today which helped to decrease trunk fleixon during gait    Personal Factors and Comorbidities Comorbidity 2;Age;Time since onset of injury/illness/exacerbation;Past/Current Experience    Comorbidities gout, back surgery    Examination-Activity Limitations Bathing;Transfers;Locomotion Level;Bed Mobility;Bend;Sit;Caring for Others;Carry;Squat;Stairs;Stand;Toileting;Lift;Hygiene/Grooming;Dressing    Examination-Participation Restrictions Church;Meal Prep;Cleaning;Community Activity;Shop;Laundry    Stability/Clinical Decision Making Evolving/Moderate complexity    Rehab Potential Good    PT Frequency 2x /  week    PT Duration 8 weeks    PT Treatment/Interventions ADLs/Self Care Home  Management;Cryotherapy;DME Instruction;Electrical Stimulation;Ultrasound;Moist Heat;Gait training;Stair training;Functional mobility training;Therapeutic activities;Therapeutic exercise;Balance training;Neuromuscular re-education;Patient/family education;Orthotic Fit/Training;Manual techniques;Splinting;Energy conservation;Dry needling;Passive range of motion;Taping;Vasopneumatic Device;Visual/perceptual remediation/compensation    PT Next Visit Plan reassess HEP,  progress R LE strengthening and balance    Consulted and Agree with Plan of Care Patient;Family member/caregiver    Family Member Consulted 2 daughters             Patient will benefit from skilled therapeutic intervention in order to improve the following deficits and impairments:  Abnormal gait, Decreased coordination, Decreased range of motion, Difficulty walking, Increased fascial restricitons, Increased muscle spasms, Decreased safety awareness, Decreased activity tolerance, Improper body mechanics, Impaired flexibility, Decreased balance, Postural dysfunction, Decreased strength, Decreased mobility  Visit Diagnosis: 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 [place], [place], [address] Phone: [phone]   Fax:  [phone]  Name: Carrie Kline MRN: [id number] Date of Birth: [birth year]

## 2021-04-13 NOTE — Therapy (Signed)
[facility] [address] [place], [place], [address] Phone: [phone]   Fax:  [phone]  Occupational Therapy Treatment  Patient Details  Name: Carrie Kline MRN: [id number] Date of Birth: 23-Nov-1945 Referring Provider (OT): Mariam Dollar, PA-C   Encounter Date: 04/12/2021   OT End of Session - 04/12/21 1428     Visit Number 3    Number of Visits 17    Date for OT Re-Evaluation 06/29/21    Authorization Type BCBS Medicare    Progress Note Due on Visit 10    OT Start Time 1400    OT Stop Time 1442    OT Time Calculation (min) 42 min    Activity Tolerance Patient tolerated treatment well    Behavior During Therapy [facility] for tasks assessed/performed             Past Medical History:  Diagnosis Date   Gout    Seasonal allergies     Past Surgical History:  Procedure Laterality Date   ABDOMINAL HYSTERECTOMY     BACK SURGERY     GANGLION CYST EXCISION      There were no vitals filed for this visit.   Subjective Assessment - 04/12/21 1426     Subjective  Reports some pain along radial aspect of thumb that was dull/achy and present Fri-Tues. Pt also reports her physician recommended continuing to rest and take supplements to help w/ persistent fatigue since d/c from hospital   no tenderness to palpation; negative Finkelstein's test   Patient is accompanied by: Family member   Daughter [name]   Pertinent History Hx of gout and back surgery    Patient Stated Goals "Moving my R arm"    Currently in Pain? No/denies             Treatment/Exercises - 04/12/21    Shoulder ROM: Seated Closed-chain BUE chest press circles w/ 1# dowel to facilitate neuro reed of shoulder ROM and muscle activation in RUE; tall mirror positioned in front of pt for visual cue to decrease compensatory pattern of shoulder elevation  BUE shoulder flexion reaching down toward floor holding small ball for benefit of gravity-assisted  positioning; pt demo'd good ROM w/ no discomfort    Manual Therapy OT-facilitated gentle PROM to R shoulder flexion, abduction, and external rotation to decrease stiffness/inhibit increased tone; able to complete approx 50% of arc of motion in all planes w/ no discomfort    Grasp & Release Attempted to pick up blocks w/ power grip exerciser, unable to complete and graded activity down to practice simple grasp and release w/ exerciser, requiring Mod A for functional grasp  Stretch and hold of R hand grasp w/ emphasis on extending hand and fingers between each rep; pt improved ability to hold position w/ increased reps         OT Education - 04/12/21 1454     Education Details Significant discussion regarding CVA condition-specific education, including but not limited to, neuro reed, RUE hemiparesis, and functional recovery patterns    Person(s) Educated Patient    Methods Explanation;Demonstration;Verbal cues    Comprehension Verbalized understanding;Need further instruction;Returned demonstration             OT Short Term Goals - 04/03/21 0947       OT SHORT TERM GOAL #1   Title Pt will improve independence w/ functional FM tasks (e.g., clothing manipulatives) as evidenced by being able to complete 9HPT w/ R hand  Baseline Completed 4 pegs in >1 min w R hand    Time 4    Period Weeks    Status On-going    Target Date 04/28/21      OT SHORT TERM GOAL #2   Title Pt will improve AROM of R shoulder flexion by at least 20 degrees to facilitate improved functional reach    Baseline R shoulder flexion 28 degrees    Time 4    Period Weeks    Status On-going      OT SHORT TERM GOAL #3   Title Pt will improve AROM of R elbow extension by at least 15 degrees to facilitate improved functional reach    Baseline Achieves 52 degrees of flexion when attempting elbow extension    Time 4    Period Weeks    Status On-going      OT SHORT TERM GOAL #4   Title Pt will demonstrate R hand  grip to within 10# of L hand to improve independence w/ IADLs    Baseline TBD    Time 4    Period Weeks    Status On-going             OT Long Term Goals - 04/03/21 0949       OT LONG TERM GOAL #1   Title Pt will be able to cut food w/ Mod I, using AE/compensatory strategies prn, 100% of the time    Baseline Frequently requires assist cutting food    Time 8    Period Weeks    Status On-going      OT LONG TERM GOAL #2   Title Pt will be able to fasten/unfasten clothing manipulatives in 5/5 trials w/ Mod I, using AE prn    Baseline Requires assist w/ fasteners    Time 8    Period Weeks    Status On-going      OT LONG TERM GOAL #3   Title Pt will demonstrate simulated tub/shower transfer safely w/ Mod I    Baseline Min A w/ tub/shower transfer    Time 8    Period Weeks    Status On-going      OT LONG TERM GOAL #4   Title Pt will demonstrate independence w/ HEP designed for coordination, ROM, strength, and functional use of RUE by d/c    Baseline No HEP at this time    Time 8    Period Weeks    Status On-going             Plan - 04/12/21 1512     Clinical Impression Statement OT continued to provide encouragement and pt-specific education as appropriate. Pt does demonstrate improved RUE AROM and continues to benefit from cues and emphasis on avoiding compensatory movement patterns to facilitating appropriate alignment/movement patterns. Due to fatigue observed w/ movement against gravity, OT facilitated shoulder ROM in gravity-assisted position w/ good results (no compensatory pattern/add'l pain or discomfort).    OT Occupational Profile and History Detailed Assessment- Review of Records and additional review of physical, cognitive, psychosocial history related to current functional performance    Occupational performance deficits (Please refer to evaluation for details): ADL's;IADL's;Work;Leisure;Social Participation    Body Structure / Function / Physical Skills  ADL;ROM;UE functional use;Mobility;FMC;Decreased knowledge of use of DME;Balance;Body mechanics;Dexterity;Gait;Vision;GMC;Strength;Endurance;Coordination;IADL;Tone    Rehab Potential Good    Clinical Decision Making Several treatment options, min-mod task modification necessary    Comorbidities Affecting Occupational Performance: May have comorbidities impacting occupational performance  Modification or Assistance to Complete Evaluation  Min-Moderate modification of tasks or assist with assess necessary to complete eval    OT Frequency 2x / week    OT Duration 8 weeks    OT Treatment/Interventions Self-care/ADL training;Electrical Stimulation;Therapeutic exercise;Visual/perceptual remediation/compensation;Patient/family education;Splinting;Neuromuscular education;Moist Heat;Aquatic Therapy;Biofeedback;Energy conservation;Building services engineer;Therapeutic activities;Balance training;Passive range of motion;Manual Therapy;DME and/or AE instruction;Ultrasound;Cryotherapy    Plan Scapular stabilization and alignment; initiate putty?    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 [place], [place], [address] Phone: [phone]   Fax:  [phone]  Name: Carrie Kline MRN: [id number] Date of Birth: [birth year]

## 2021-04-14 ENCOUNTER — Ambulatory Visit: Payer: Medicare Other | Admitting: Rehabilitative and Restorative Service Providers"

## 2021-04-14 ENCOUNTER — Other Ambulatory Visit: Payer: Self-pay

## 2021-04-14 ENCOUNTER — Encounter: Payer: Self-pay | Admitting: Rehabilitative and Restorative Service Providers"

## 2021-04-14 ENCOUNTER — Ambulatory Visit: Payer: Medicare Other | Admitting: Occupational Therapy

## 2021-04-14 DIAGNOSIS — R2681 Unsteadiness on feet: Secondary | ICD-10-CM

## 2021-04-14 DIAGNOSIS — I69353 Hemiplegia and hemiparesis 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: Secondary | ICD-10-CM

## 2021-04-14 DIAGNOSIS — R278 Other lack of coordination: Secondary | ICD-10-CM

## 2021-04-14 DIAGNOSIS — M6281 Muscle weakness (generalized): Secondary | ICD-10-CM

## 2021-04-14 NOTE — Therapy (Signed)
[facility] [address] [place], [place], [address] Phone: [phone]   Fax:  [phone]  Occupational Therapy Treatment  Patient Details  Name: Carrie Kline MRN: [id number] Date of Birth: 01/24/1946 Referring Provider (OT): Mariam Dollar, PA-C   Encounter Date: 04/14/2021   OT End of Session - 04/14/21 1048     Visit Number 4    Number of Visits 17    Date for OT Re-Evaluation 06/29/21    Authorization Type BCBS Medicare    Progress Note Due on Visit 10    OT Start Time 1015    OT Stop Time 1056    OT Time Calculation (min) 41 min    Activity Tolerance Patient tolerated treatment well    Behavior During Therapy [facility] for tasks assessed/performed            Past Medical History:  Diagnosis Date   Gout    Seasonal allergies     Past Surgical History:  Procedure Laterality Date   ABDOMINAL HYSTERECTOMY     BACK SURGERY     GANGLION CYST EXCISION      There were no vitals filed for this visit.   Subjective Assessment - 04/14/21 1020     Subjective  "I feel really tired today;" pt also reports she still feels frustrated with how slow the recovery process is, but does acknowledge the improvements she has had in a short amount of time    Patient is accompanied by: Family member   daughter [name]   Pertinent History Hx of gout and back surgery    Patient Stated Goals "Moving my R arm"    Currently in Pain? Yes    Pain Score 2     Pain Location Wrist    Pain Orientation Right    Pain Descriptors / Indicators Tender    Pain Type Acute pain             Treatment/Exercises - 04/14/21    Neuro Reeducation Towel slides on tabletop w/ RUE for shoulder flex/elbow ext with min facilitation/cues for elbow ext each rep. Pt demo'd good ROM w/ no compensatory shoulder elevation; multiple rest breaks due to fatigue    Putty Exercises Full gross grasp of putty w/ R hand 20x slowly focusing on functional grasp and  release w/ OT providing education on maintaining wrist in neutral and not overstraining to force movement          OT Education - 04/14/21 1040     Education Details Continued condition-specific education and reviewed HEP; initiated gross grasp of yellow putty (see pt instructions). Discussed pain/edema mgmt strategies for tenderness of R wrist (radial aspect) and encouraged pt to seek add'l care w/ new/worsening symptoms    Person(s) Educated Patient    Methods Explanation    Comprehension Verbalized understanding             OT Short Term Goals - 04/03/21 0947       OT SHORT TERM GOAL #1   Title Pt will improve independence w/ functional FM tasks (e.g., clothing manipulatives) as evidenced by being able to complete 9HPT w/ R hand    Baseline Completed 4 pegs in >1 min w R hand    Time 4    Period Weeks    Status On-going    Target Date 04/28/21      OT SHORT TERM GOAL #2   Title Pt will improve AROM of R shoulder flexion by at  least 20 degrees to facilitate improved functional reach    Baseline R shoulder flexion 28 degrees    Time 4    Period Weeks    Status On-going      OT SHORT TERM GOAL #3   Title Pt will improve AROM of R elbow extension by at least 15 degrees to facilitate improved functional reach    Baseline Achieves 52 degrees of flexion when attempting elbow extension    Time 4    Period Weeks    Status On-going      OT SHORT TERM GOAL #4   Title Pt will demonstrate R hand grip to within 10# of L hand to improve independence w/ IADLs    Baseline TBD    Time 4    Period Weeks    Status On-going             OT Long Term Goals - 04/03/21 0949       OT LONG TERM GOAL #1   Title Pt will be able to cut food w/ Mod I, using AE/compensatory strategies prn, 100% of the time    Baseline Frequently requires assist cutting food    Time 8    Period Weeks    Status On-going      OT LONG TERM GOAL #2   Title Pt will be able to fasten/unfasten clothing  manipulatives in 5/5 trials w/ Mod I, using AE prn    Baseline Requires assist w/ fasteners    Time 8    Period Weeks    Status On-going      OT LONG TERM GOAL #3   Title Pt will demonstrate simulated tub/shower transfer safely w/ Mod I    Baseline Min A w/ tub/shower transfer    Time 8    Period Weeks    Status On-going      OT LONG TERM GOAL #4   Title Pt will demonstrate independence w/ HEP designed for coordination, ROM, strength, and functional use of RUE by d/c    Baseline No HEP at this time    Time 8    Period Weeks    Status On-going             Plan - 04/14/21 1047     Clinical Impression Statement OT continued to provide encouragement and relevant condition-specific education as appropriate. RUE AROM completed in gravity-minimized positioning due to pt's report of fatigue and to continue to facilitate decreased compensatory shoulder elevation. OT also introduced RUE grasp and release w/ putty for light strengthening, proprioceptive input, and muscle activation. Pt also discussed options for reducing frequency within next few weeks due to financial considerations.    OT Occupational Profile and History Detailed Assessment- Review of Records and additional review of physical, cognitive, psychosocial history related to current functional performance    Occupational performance deficits (Please refer to evaluation for details): ADL's;IADL's;Work;Leisure;Social Participation    Body Structure / Function / Physical Skills ADL;ROM;UE functional use;Mobility;FMC;Decreased knowledge of use of DME;Balance;Body mechanics;Dexterity;Gait;Vision;GMC;Strength;Endurance;Coordination;IADL;Tone    Rehab Potential Good    Clinical Decision Making Several treatment options, min-mod task modification necessary    Comorbidities Affecting Occupational Performance: May have comorbidities impacting occupational performance    Modification or Assistance to Complete Evaluation  Min-Moderate  modification of tasks or assist with assess necessary to complete eval    OT Frequency 2x / week    OT Duration 8 weeks    OT Treatment/Interventions Self-care/ADL training;Electrical Stimulation;Therapeutic exercise;Visual/perceptual remediation/compensation;Patient/family education;Splinting;Neuromuscular education;Moist  Heat;Aquatic Therapy;Biofeedback;Energy conservation;Building services engineer;Therapeutic activities;Balance training;Passive range of motion;Manual Therapy;DME and/or AE instruction;Ultrasound;Cryotherapy    Plan Scapular stabilization and alignment exercises    Consulted and Agree with Plan of Care Patient;Family member/caregiver    Family Member Consulted Kim (daughter)             Patient will benefit from skilled therapeutic intervention in order to improve the following deficits and impairments:   Body Structure / Function / Physical Skills: ADL, ROM, UE functional use, Mobility, FMC, Decreased knowledge of use of DME, Balance, Body mechanics, Dexterity, Gait, Vision, GMC, Strength, Endurance, Coordination, IADL, Tone   Visit Diagnosis: 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 [place], [place], [address] Phone: [phone]   Fax:  [phone]  Name: Carrie Kline MRN: [id number] Date of Birth: 01/16/1946

## 2021-04-14 NOTE — Therapy (Signed)
[facility] [place] [address] [place], [place], [address] Phone: [phone]   Fax:  [phone]  Physical Therapy Treatment  Patient Details  Name: Carrie Kline MRN: [id number] Date of Birth: 03/27/1946 Referring Provider (PT): Charlton Amor, PA-C   Encounter Date: 04/14/2021   PT End of Session - 04/14/21 1150     Visit Number 3    Number of Visits 17    Date for PT Re-Evaluation 05/26/21    Authorization Type BCBS    PT Start Time 1100    PT Stop Time 1140    PT Time Calculation (min) 40 min    Equipment Utilized During Treatment Gait belt    Activity Tolerance Patient tolerated treatment well    Behavior During Therapy WFL for tasks assessed/performed             Past Medical History:  Diagnosis Date   Gout    Seasonal allergies     Past Surgical History:  Procedure Laterality Date   ABDOMINAL HYSTERECTOMY     BACK SURGERY     GANGLION CYST EXCISION      There were no vitals filed for this visit.   Subjective Assessment - 04/14/21 1148     Subjective I am tired today    Currently in Pain? Yes    Pain Score 2     Pain Location Wrist    Pain Orientation Right    Pain Descriptors / Indicators Tender    Pain Type Acute pain                               [facility] Adult PT Treatment/Exercise - 04/14/21 0001       Ambulation/Gait   Ambulation/Gait Yes    Ambulation/Gait Assistance 5: Supervision    Ambulation Distance (Feet) 400 Feet    Assistive device Rolling walker    Ambulation Surface Outdoor;Paved    Gait velocity decreased      Lumbar Exercises: Aerobic   Nustep L4 x6 min      Knee/Hip Exercises: Standing   Hip Flexion Stengthening;Both;2 sets;10 reps    Hip Abduction Stengthening;Both;2 sets;10 reps    Other Standing Knee Exercises Marching B 2x10      Knee/Hip Exercises: Seated   Long Arc Quad Strengthening;Both;2 sets;10 reps    Long Arc Quad Weight 3 lbs.     Marching Strengthening;Both;2 sets;10 reps    Marching Limitations 2.5#    Sit to [employer] 2 sets;10 reps                      PT Short Term Goals - 04/14/21 1154       PT SHORT TERM GOAL #1   Title Patient to be independent with initial HEP.    Status Achieved               PT Long Term Goals - 04/14/21 1154       PT LONG TERM GOAL #1   Title Patient to be independent with advanced HEP.    Status On-going      PT LONG TERM GOAL #2   Title Patient to demonstrate R LE strength >/=4/5.    Status On-going                   Plan - 04/14/21 1151     Clinical Impression Statement Ms Hertzberg did well in  session despite stating that she was feeling tired today.  She was able to progress strengthening exercises today and had decreased utilization of UE with sit to stand as session progressed.  Pt was able to ambulate outside without any loss of balance, including negotiating curb with RW.    PT Treatment/Interventions ADLs/Self Care Home Management;Cryotherapy;DME Instruction;Electrical Stimulation;Ultrasound;Moist Heat;Gait training;Stair training;Functional mobility training;Therapeutic activities;Therapeutic exercise;Balance training;Neuromuscular re-education;Patient/family education;Orthotic Fit/Training;Manual techniques;Splinting;Energy conservation;Dry needling;Passive range of motion;Taping;Vasopneumatic Device;Visual/perceptual remediation/compensation    PT Next Visit Plan reassess HEP,  progress R LE strengthening and balance    Consulted and Agree with Plan of Care Patient             Patient will benefit from skilled therapeutic intervention in order to improve the following deficits and impairments:  Abnormal gait, Decreased coordination, Decreased range of motion, Difficulty walking, Increased fascial restricitons, Increased muscle spasms, Decreased safety awareness, Decreased activity tolerance, Improper body mechanics, Impaired flexibility,  Decreased balance, Postural dysfunction, Decreased strength, Decreased mobility  Visit Diagnosis: 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 [place], [place], [address] Phone: [phone]   Fax:  [phone]  Name: Carrie Kline MRN: [id number] Date of Birth: 1946/05/03

## 2021-04-14 NOTE — Patient Instructions (Signed)
Grip Strengthening (Resistive Putty)    Squeeze putty using thumb and all fingers. Repeat 15-20 times. Do 2-3 sessions per day.  Copyright  VHI. All rights reserved.

## 2021-04-18 ENCOUNTER — Ambulatory Visit: Payer: Medicare Other | Admitting: Physical Therapy

## 2021-04-18 ENCOUNTER — Other Ambulatory Visit: Payer: Self-pay

## 2021-04-18 ENCOUNTER — Ambulatory Visit: Payer: Medicare Other | Admitting: Occupational Therapy

## 2021-04-18 ENCOUNTER — Encounter: Payer: Self-pay | Admitting: Physical Therapy

## 2021-04-18 ENCOUNTER — Encounter: Payer: Self-pay | Admitting: Occupational Therapy

## 2021-04-18 DIAGNOSIS — R2681 Unsteadiness on feet: Secondary | ICD-10-CM

## 2021-04-18 DIAGNOSIS — R261 Paralytic gait: Secondary | ICD-10-CM

## 2021-04-18 DIAGNOSIS — R278 Other lack of coordination: Secondary | ICD-10-CM

## 2021-04-18 DIAGNOSIS — I69353 Hemiplegia and hemiparesis 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: Secondary | ICD-10-CM | POA: Diagnosis not present

## 2021-04-18 DIAGNOSIS — I639 Cerebral infarction, unspecified: Secondary | ICD-10-CM

## 2021-04-18 DIAGNOSIS — M6281 Muscle weakness (generalized): Secondary | ICD-10-CM

## 2021-04-18 NOTE — Therapy (Signed)
[facility] [place] [address] [place], [place], [address] Phone: [phone]   Fax:  [phone]  Occupational Therapy Treatment  Patient Details  Name: Carrie Kline MRN: [id number] Date of Birth: 1946/01/30 Referring Provider (OT): Mariam Dollar, PA-C   Encounter Date: 04/18/2021   OT End of Session - 04/18/21 1117     Visit Number 5    Number of Visits 17    Date for OT Re-Evaluation 06/29/21    Authorization Type BCBS Medicare    Progress Note Due on Visit 10    OT Start Time 1105    OT Stop Time 1147    OT Time Calculation (min) 42 min    Activity Tolerance Patient tolerated treatment well    Behavior During Therapy [facility] for tasks assessed/performed             Past Medical History:  Diagnosis Date   Gout    Seasonal allergies     Past Surgical History:  Procedure Laterality Date   ABDOMINAL HYSTERECTOMY     BACK SURGERY     GANGLION CYST EXCISION      There were no vitals filed for this visit.   Subjective Assessment - 04/18/21 1111     Subjective  Pt reports her wrist has been feeling better; she has new handle cushions for her RW and has been icing it. Pt also states she had mild achy/dull pain in her shoulder after exercises on Sunday, but no discomfort/pain since    Patient is accompanied by: Family member   daughter [name]   Pertinent History Hx of gout and back surgery    Patient Stated Goals "Moving my R arm"    Currently in Pain? No/denies             Treatment/Exercises - 04/18/21    Manual Therapy  OT-facilitated gentle PROM to R shoulder flexion and abduction to decrease stiffness/inhibit increased tone; able to complete approx 50% of arc of motion in all planes w/ no c/o discomfort    Neuro Reeducation     Scapular/Shoulder Strengthening Closed-chain isometric contraction of BUE external rotation using yellow theraband and internal rotation/adduction using small ball. Pt required visual  and tactile cues to maintain RUE involvement during external rotation and was ultimately able to consistently demo good muscle activation w/ both exercises; completed 2 sets of 10 reps reach     Shoulder Exercises: Seated Closed-chain AROM of BUE forward chest press w/ PVC pipe dowel rod to facilitate neuro reed and improved functional reach at chest height; completed in sitting w/ able to achieve approx 80 degrees of flexion w/ each rep; min verbal cues for compensatory pattern of R shoulder elevation         OT Education - 04/18/21 1746     Education Details Reviewed progress toward functional goals/improvements at home; continued condition-specific education    Person(s) Educated Patient    Methods Explanation    Comprehension Verbalized understanding             OT Short Term Goals - 04/18/21 1141       OT SHORT TERM GOAL #1   Title Pt will improve independence w/ functional FM tasks (e.g., clothing manipulatives) as evidenced by being able to complete 9HPT w/ R hand    Baseline Completed 4 pegs in >1 min w R hand    Time 4    Period Weeks    Status On-going    Target Date  04/28/21      OT SHORT TERM GOAL #2   Title Pt will improve AROM of R shoulder flexion by at least 20 degrees to facilitate improved functional reach    Baseline R shoulder flexion 28 degrees    Time 4    Period Weeks    Status On-going      OT SHORT TERM GOAL #3   Title Pt will improve AROM of R elbow extension by at least 15 degrees to facilitate improved functional reach    Baseline Achieves 52 degrees of flexion when attempting elbow extension    Time 4    Period Weeks    Status Achieved   04/18/21 - 21* of flex w/ elbow ext     OT SHORT TERM GOAL #4   Title Pt will demonstrate R hand grip to within 10# of L hand to improve independence w/ IADLs    Baseline TBD    Time 4    Period Weeks    Status On-going             OT Long Term Goals - 04/03/21 0949       OT LONG TERM GOAL #1    Title Pt will be able to cut food w/ Mod I, using AE/compensatory strategies prn, 100% of the time    Baseline Frequently requires assist cutting food    Time 8    Period Weeks    Status On-going      OT LONG TERM GOAL #2   Title Pt will be able to fasten/unfasten clothing manipulatives in 5/5 trials w/ Mod I, using AE prn    Baseline Requires assist w/ fasteners    Time 8    Period Weeks    Status On-going      OT LONG TERM GOAL #3   Title Pt will demonstrate simulated tub/shower transfer safely w/ Mod I    Baseline Min A w/ tub/shower transfer    Time 8    Period Weeks    Status On-going      OT LONG TERM GOAL #4   Title Pt will demonstrate independence w/ HEP designed for coordination, ROM, strength, and functional use of RUE by d/c    Baseline No HEP at this time    Time 8    Period Weeks    Status On-going             Plan - 04/18/21 1748     Clinical Impression Statement Pt continues to benefit from encouragement and relevant condition-specific education as appropriate. Shoulder shrugs appear smoother and more level and pt demo'd decrease R shoulder elevation during chest presses while seated, which both indicated improving scapular stabilization and strength. OT continued to facilitate scapular/shoulder AROM and strengthening this session w/ positive results and no adverse reactions.    OT Occupational Profile and History Detailed Assessment- Review of Records and additional review of physical, cognitive, psychosocial history related to current functional performance    Occupational performance deficits (Please refer to evaluation for details): ADL's;IADL's;Work;Leisure;Social Participation    Body Structure / Function / Physical Skills ADL;ROM;UE functional use;Mobility;FMC;Decreased knowledge of use of DME;Balance;Body mechanics;Dexterity;Gait;Vision;GMC;Strength;Endurance;Coordination;IADL;Tone    Rehab Potential Good    Clinical Decision Making Several treatment  options, min-mod task modification necessary    Comorbidities Affecting Occupational Performance: May have comorbidities impacting occupational performance    Modification or Assistance to Complete Evaluation  Min-Moderate modification of tasks or assist with assess necessary to complete eval  OT Frequency 2x / week    OT Duration 8 weeks    OT Treatment/Interventions Self-care/ADL training;Electrical Stimulation;Therapeutic exercise;Visual/perceptual remediation/compensation;Patient/family education;Splinting;Neuromuscular education;Moist Heat;Aquatic Therapy;Biofeedback;Energy conservation;Building services engineer;Therapeutic activities;Balance training;Passive range of motion;Manual Therapy;DME and/or AE instruction;Ultrasound;Cryotherapy    Plan Continue shoulder stabilization and closed-chain ROM (attempt in sitting w/ tall mirror?); assess grip strength and potential for progression w/ putty    Consulted and Agree with Plan of Care Patient;Family member/caregiver    Family Member Consulted Kim (daughter)             Patient will benefit from skilled therapeutic intervention in order to improve the following deficits and impairments:   Body Structure / Function / Physical Skills: ADL, ROM, UE functional use, Mobility, FMC, Decreased knowledge of use of DME, Balance, Body mechanics, Dexterity, Gait, Vision, GMC, Strength, Endurance, Coordination, IADL, Tone   Visit Diagnosis: 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 [place], [place], [address] Phone: [phone]   Fax:  [phone]  Name: Carrie Kline MRN: [id number] Date of Birth: December 14, 1945

## 2021-04-18 NOTE — Therapy (Signed)
[facility] [place] [facility] [address] [place], [place], [address] Phone: [phone]   Fax:  [phone]  Physical Therapy Treatment  Patient Details  Name: Carrie Kline MRN: [id number] Date of Birth: Jun 16, 1946 Referring Provider (PT): Charlton Amor, PA-C   Encounter Date: 04/18/2021   PT End of Session - 04/18/21 1154     Visit Number 4    Number of Visits 17    Date for PT Re-Evaluation 05/26/21    Authorization Type BCBS    PT Start Time 1014    PT Stop Time 1059    PT Time Calculation (min) 45 min    Equipment Utilized During Treatment Gait belt    Activity Tolerance Patient tolerated treatment well;Patient limited by fatigue    Behavior During Therapy WFL for tasks assessed/performed             Past Medical History:  Diagnosis Date   Gout    Seasonal allergies     Past Surgical History:  Procedure Laterality Date   ABDOMINAL HYSTERECTOMY     BACK SURGERY     GANGLION CYST EXCISION      There were no vitals filed for this visit.   Subjective Assessment - 04/18/21 1017     Subjective Doing well. Son visited over the weekend. Doesn't have a lot of energy.    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 No/denies                               [facility] Adult PT Treatment/Exercise - 04/18/21 0001       Lumbar Exercises: Aerobic   Nustep L5 x6 min      Lumbar Exercises: Seated   Sit to Stand 10 reps    Sit to Stand Limitations no UE support; cueing to scoot to EOM and place R foot back further for increased challenge      Lumbar Exercises: Supine   Bridge 10 reps   2 sets; head elevated on 3 pillows d/t  anxiety   Other Supine Lumbar Exercises marches B alt with red TB aroud toes 2 x 10      Knee/Hip Exercises: Standing   Knee Flexion Strengthening;Right;10 reps   2# HS curl; manual assistance to avoid hip extension   Other Standing Knee Exercises Marching B 2x10 with 2#   2nd set with PT assistance on R   Other Standing Knee Exercises R toe tap on cone 2# in II bars 10x each   cues to encourage hip/knee flexion; heavy UE use     Knee/Hip Exercises: Seated   Other Seated Knee/Hip Exercises R ankle DF AROM x15, with yellow TB 2x10                    PT Education - 04/18/21 1154     Education Details update to HEP    Person(s) Educated Patient    Methods Explanation;Demonstration;Tactile cues;Verbal cues;Handout    Comprehension Verbalized understanding;Returned demonstration              PT Short Term Goals - 04/14/21 1154  PT SHORT TERM GOAL #1   Title Patient to be independent with initial HEP.    Status Achieved               PT Long Term Goals - 04/14/21 1154       PT LONG TERM GOAL #1   Title Patient to be independent with advanced HEP.    Status On-going      PT LONG TERM GOAL #2   Title Patient to demonstrate R LE strength >/=4/5.    Status On-going                   Plan - 04/18/21 1156     Clinical Impression Statement Patient still reporting fatigue but no pain at start of session. Worked on STS transfers with cueing to scoot to EOM and place R foot back further for improved R LE utilization. Mat ther-ex for hip strengthening was performed with good effort; remainders given to decrease speed and increase amplitude of movements. Patient remarking on new ability to move her toes and ankle over the weekend. Able to perform seated R ankle dorsiflexion AROM with and without resistance with good tolerance; updated this into HEP. Standing ther-ex was performed with cueing to encourage R hip/knee flexion. Patient initially demonstrated R  hip circumduction with toe taps, which improved after cueing. Intermittent sit breaks taken d/t fatigue. Patient noted no complaints at end of session. Progressing well towards goals.    Comorbidities gout, back surgery    PT Treatment/Interventions ADLs/Self Care Home Management;Cryotherapy;DME Instruction;Electrical Stimulation;Ultrasound;Moist Heat;Gait training;Stair training;Functional mobility training;Therapeutic activities;Therapeutic exercise;Balance training;Neuromuscular re-education;Patient/family education;Orthotic Fit/Training;Manual techniques;Splinting;Energy conservation;Dry needling;Passive range of motion;Taping;Vasopneumatic Device;Visual/perceptual remediation/compensation    PT Next Visit Plan progress R LE strengthening and balance    Consulted and Agree with Plan of Care Patient             Patient will benefit from skilled therapeutic intervention in order to improve the following deficits and impairments:  Abnormal gait, Decreased coordination, Decreased range of motion, Difficulty walking, Increased fascial restricitons, Increased muscle spasms, Decreased safety awareness, Decreased activity tolerance, Improper body mechanics, Impaired flexibility, Decreased balance, Postural dysfunction, Decreased strength, Decreased mobility  Visit Diagnosis: 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 [place], [place], [address] Phone:  [phone]   Fax:  [phone]  Name: Seraphim Affinito MRN: [id number] Date of Birth: [birth year]

## 2021-04-20 ENCOUNTER — Ambulatory Visit: Payer: Medicare Other | Admitting: Physical Therapy

## 2021-04-20 ENCOUNTER — Other Ambulatory Visit: Payer: Self-pay

## 2021-04-20 ENCOUNTER — Ambulatory Visit: Payer: Medicare Other | Admitting: Occupational Therapy

## 2021-04-20 ENCOUNTER — Encounter: Payer: Self-pay | Admitting: Physical Therapy

## 2021-04-20 ENCOUNTER — Encounter: Payer: Self-pay | Admitting: Occupational Therapy

## 2021-04-20 DIAGNOSIS — R278 Other lack of coordination: Secondary | ICD-10-CM

## 2021-04-20 DIAGNOSIS — R2681 Unsteadiness on feet: Secondary | ICD-10-CM

## 2021-04-20 DIAGNOSIS — M6281 Muscle weakness (generalized): Secondary | ICD-10-CM

## 2021-04-20 DIAGNOSIS — R261 Paralytic gait: Secondary | ICD-10-CM

## 2021-04-20 DIAGNOSIS — I69353 Hemiplegia and hemiparesis 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: Secondary | ICD-10-CM | POA: Diagnosis not present

## 2021-04-20 NOTE — Patient Instructions (Signed)
Ear / Shoulder Stretch    Facing forward with shoulders relaxed, drop right ear to right shoulder. Return. Do not raise your shoulders. Hold position for 5-10 seconds. Repeat 5 times.  Copyright  VHI. All rights reserved.

## 2021-04-20 NOTE — Therapy (Signed)
[facility] [address] [place], [place], [address] Phone: [phone]   Fax:  [phone]  Physical Therapy Treatment  Patient Details  Name: Carrie Kline MRN: [id number] Date of Birth: 1946-04-02 Referring Provider (PT): Charlton Amor, PA-C   Encounter Date: 04/20/2021   PT End of Session - 04/20/21 1100     Visit Number 5    Number of Visits 17    Date for PT Re-Evaluation 05/26/21    Authorization Type BCBS    PT Start Time 1018    PT Stop Time 1059    PT Time Calculation (min) 41 min    Equipment Utilized During Treatment Gait belt    Activity Tolerance Patient tolerated treatment well    Behavior During Therapy WFL for tasks assessed/performed             Past Medical History:  Diagnosis Date   Gout    Seasonal allergies     Past Surgical History:  Procedure Laterality Date   ABDOMINAL HYSTERECTOMY     BACK SURGERY     GANGLION CYST EXCISION      There were no vitals filed for this visit.   Subjective Assessment - 04/20/21 0935     Subjective "My fingers are starting to work a little bit." New HEP is going well.    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 No/denies                               [facility] Adult PT Treatment/Exercise - 04/20/21 0001       Neuro Re-ed    Neuro Re-ed Details  romberg EO/EC on firm surface 30" each with CGA; standing on foam x 1 min with cueing to maintain upright chest and R wt shift      Lumbar Exercises: Aerobic   Nustep L5 x6 min      Knee/Hip Exercises: Stretches   Theme park manager Right;2 reps;30 seconds    Gastroc Stretch Limitations seated with strap    cueing to maintain pull throughout and keep heel down     Knee/Hip Exercises: Standing   Heel Raises Both;2 sets;10 reps    Heel Raises Limitations in walker; manual assistance to avoid R knee flexion    Other Standing Knee Exercises B toe raise in walker 2x10   heavy anterior trunk lean     Knee/Hip Exercises: Seated   Other Seated Knee/Hip Exercises R ankle DF with yellow TB 2x10    Marching Strengthening;Both;2 sets;10 reps    Marching Limitations yellow loop    Hamstring Curl Strengthening;Right;2 sets;10 reps    Hamstring Limitations red TB                    PT Education - 04/20/21 1100     Education Details update to HEP    Person(s) Educated Patient    Methods Explanation;Demonstration;Tactile cues;Verbal cues;Handout    Comprehension Verbalized understanding;Returned demonstration              PT Short Term Goals - 04/14/21 1154  PT SHORT TERM GOAL #1   Title Patient to be independent with initial HEP.    Status Achieved               PT Long Term Goals - 04/14/21 1154       PT LONG TERM GOAL #1   Title Patient to be independent with advanced HEP.    Status On-going      PT LONG TERM GOAL #2   Title Patient to demonstrate R LE strength >/=4/5.    Status On-going                   Plan - 04/20/21 1101     Clinical Impression Statement Patient arrived to session without new complaints. Continued working on gastroc stretching and dorsiflexion strengthening with good improvement in ankle mobility. Standing ankle strengthening was performed with additional manual assistance to avoid knee flexion with heel raises. Worked on sitting HS strengthening d/t difficulty in standing last session, with better success however still with significant weakness. Updated this exercise into HEP- patient reported understanding. Hip strengthening in sitting was also progressed with good effort. Some apprehension was apparent with standing balance  exercises. Patient tolerated session well today and without complaints upon leaving.    Comorbidities gout, back surgery    PT Treatment/Interventions ADLs/Self Care Home Management;Cryotherapy;DME Instruction;Electrical Stimulation;Ultrasound;Moist Heat;Gait training;Stair training;Functional mobility training;Therapeutic activities;Therapeutic exercise;Balance training;Neuromuscular re-education;Patient/family education;Orthotic Fit/Training;Manual techniques;Splinting;Energy conservation;Dry needling;Passive range of motion;Taping;Vasopneumatic Device;Visual/perceptual remediation/compensation    PT Next Visit Plan progress R LE strengthening and balance    Consulted and Agree with Plan of Care Patient             Patient will benefit from skilled therapeutic intervention in order to improve the following deficits and impairments:  Abnormal gait, Decreased coordination, Decreased range of motion, Difficulty walking, Increased fascial restricitons, Increased muscle spasms, Decreased safety awareness, Decreased activity tolerance, Improper body mechanics, Impaired flexibility, Decreased balance, Postural dysfunction, Decreased strength, Decreased mobility  Visit Diagnosis: 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 [place], [place], [address] Phone: [phone]   Fax:  [phone]  Name: Carrie Kline MRN: [id number] Date of Birth: 05-Jan-1946

## 2021-04-21 NOTE — Therapy (Signed)
[facility] [place] [address] [place], [place], [address] Phone: [phone]   Fax:  [phone]  Occupational Therapy Treatment  Patient Details  Name: Carrie Kline MRN: [id number] Date of Birth: [birth year] Referring Provider (OT): Mariam Dollar, PA-C   Encounter Date: 04/20/2021   OT End of Session - 04/20/21 0937     Visit Number 6    Number of Visits 17    Date for OT Re-Evaluation 06/29/21    Authorization Type BCBS Medicare    Progress Note Due on Visit 10    OT Start Time [phone]    OT Stop Time 1015    OT Time Calculation (min) 42 min    Activity Tolerance Patient tolerated treatment well    Behavior During Therapy [facility] for tasks assessed/performed            Past Medical History:  Diagnosis Date   Gout    Seasonal allergies     Past Surgical History:  Procedure Laterality Date   ABDOMINAL HYSTERECTOMY     BACK SURGERY     GANGLION CYST EXCISION      There were no vitals filed for this visit.   Subjective Assessment - 04/20/21 0936     Subjective  "I don't usually get up this early"    Patient is accompanied by: Family member   daughter [name]   Pertinent History Hx of gout and back surgery    Patient Stated Goals "Moving my R arm"    Currently in Pain? No/denies             Treatment/Exercises - 04/20/21    Manual Therapy     Scapular Depression OT provided facilitation at R spine of scapula/shoulder while pt completing ear-to-shoulder stretch toward contralateral side; also completed stretch w/ head rotation and flexion toward contralateral side. OT also incorporated myofascial stretching prn. Pt exhibits significant tightness of R upper trap.    Scapular Mobilization Scapular mobilization w/ pt's completing body on arm movements simultaneously w/ OT facilitation along body of scapula and anterior shoulder    Neuromuscular Reeducation     Grasp & Release Picking up 1" blocks alternating between  pincer and tripod pinch and placing in a stack with min cues for slow/controlled movements and tactile cues for decreased compensatory pattern of shoulder elevation; pt demo'd good control/no drops during activity            OT Education - 04/20/21 0949     Education Details Continued condition-specific education; progressed HEP (see pt exercises)    Person(s) Educated Patient    Methods Explanation;Handout;Demonstration;Tactile cues    Comprehension Verbalized understanding;Returned demonstration             OT Short Term Goals - 04/20/21 1009       OT SHORT TERM GOAL #1   Title Pt will improve independence w/ functional FM tasks (e.g., clothing manipulatives) as evidenced by being able to complete 9HPT w/ R hand    Baseline Completed 4 pegs in >1 min w R hand    Time 4    Period Weeks    Status Achieved   04/20/21 - 1 min, 23 sec   Target Date 04/28/21      OT SHORT TERM GOAL #2   Title Pt will improve AROM of R shoulder flexion by at least 20 degrees to facilitate improved functional reach    Baseline R shoulder flexion 28 degrees    Time 4  Period Weeks    Status On-going      OT SHORT TERM GOAL #3   Title Pt will improve AROM of R elbow extension by at least 15 degrees to facilitate improved functional reach    Baseline Achieves 52 degrees of flexion when attempting elbow extension    Time 4    Period Weeks    Status Achieved   04/18/21 - 21* of flex w/ elbow ext     OT SHORT TERM GOAL #4   Title Pt will demonstrate R hand grip to within 10# of L hand to improve independence w/ IADLs    Baseline TBD    Time 4    Period Weeks    Status On-going             OT Long Term Goals - 04/03/21 0949       OT LONG TERM GOAL #1   Title Pt will be able to cut food w/ Mod I, using AE/compensatory strategies prn, 100% of the time    Baseline Frequently requires assist cutting food    Time 8    Period Weeks    Status On-going      OT LONG TERM GOAL #2   Title  Pt will be able to fasten/unfasten clothing manipulatives in 5/5 trials w/ Mod I, using AE prn    Baseline Requires assist w/ fasteners    Time 8    Period Weeks    Status On-going      OT LONG TERM GOAL #3   Title Pt will demonstrate simulated tub/shower transfer safely w/ Mod I    Baseline Min A w/ tub/shower transfer    Time 8    Period Weeks    Status On-going      OT LONG TERM GOAL #4   Title Pt will demonstrate independence w/ HEP designed for coordination, ROM, strength, and functional use of RUE by d/c    Baseline No HEP at this time    Time 8    Period Weeks    Status On-going             Plan - 04/20/21 0953     Clinical Impression Statement To continue to prepare RUE ROM for improved participation in functional tasks, OT facilitated stretching of tight muscles likely limiting good alignment during AROM. OT focused on scapular depression and scapular mobilization. Pt also demo'd significant improvements in R hand functional FMC; due to this, OT reassessed 9HPT w/ pt able to complete activity w/ increased time and no drops.    OT Occupational Profile and History Detailed Assessment- Review of Records and additional review of physical, cognitive, psychosocial history related to current functional performance    Occupational performance deficits (Please refer to evaluation for details): ADL's;IADL's;Work;Leisure;Social Participation    Body Structure / Function / Physical Skills ADL;ROM;UE functional use;Mobility;FMC;Decreased knowledge of use of DME;Balance;Body mechanics;Dexterity;Gait;Vision;GMC;Strength;Endurance;Coordination;IADL;Tone    Rehab Potential Good    Clinical Decision Making Several treatment options, min-mod task modification necessary    Comorbidities Affecting Occupational Performance: May have comorbidities impacting occupational performance    Modification or Assistance to Complete Evaluation  Min-Moderate modification of tasks or assist with assess  necessary to complete eval    OT Frequency 2x / week    OT Duration 8 weeks    OT Treatment/Interventions Self-care/ADL training;Electrical Stimulation;Therapeutic exercise;Visual/perceptual remediation/compensation;Patient/family education;Splinting;Neuromuscular education;Moist Heat;Aquatic Therapy;Biofeedback;Energy conservation;Building services engineer;Therapeutic activities;Balance training;Passive range of motion;Manual Therapy;DME and/or AE instruction;Ultrasound;Cryotherapy    Plan Assess grip  strength; continue scapular stretching and stabilization (depression, body on arm mobilization, adduction)    Consulted and Agree with Plan of Care Patient;Family member/caregiver    Family Member Consulted Kim (daughter)             Patient will benefit from skilled therapeutic intervention in order to improve the following deficits and impairments:   Body Structure / Function / Physical Skills: ADL, ROM, UE functional use, Mobility, FMC, Decreased knowledge of use of DME, Balance, Body mechanics, Dexterity, Gait, Vision, GMC, Strength, Endurance, Coordination, IADL, Tone   Visit Diagnosis: 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 [place], [place], [address] Phone: [phone]   Fax:  [phone]  Name: Beyonce Sawatzky MRN: [id number] Date of Birth: Jan 19, 1946

## 2021-04-25 ENCOUNTER — Ambulatory Visit: Payer: Medicare Other | Admitting: Physical Therapy

## 2021-04-25 ENCOUNTER — Other Ambulatory Visit: Payer: Self-pay

## 2021-04-25 ENCOUNTER — Encounter: Payer: Self-pay | Admitting: Occupational Therapy

## 2021-04-25 ENCOUNTER — Encounter: Payer: Self-pay | Admitting: Physical Therapy

## 2021-04-25 ENCOUNTER — Ambulatory Visit: Payer: Medicare Other | Admitting: Occupational Therapy

## 2021-04-25 DIAGNOSIS — R2681 Unsteadiness on feet: Secondary | ICD-10-CM

## 2021-04-25 DIAGNOSIS — I69353 Hemiplegia and hemiparesis 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: Secondary | ICD-10-CM

## 2021-04-25 DIAGNOSIS — M6281 Muscle weakness (generalized): Secondary | ICD-10-CM

## 2021-04-25 DIAGNOSIS — R278 Other lack of coordination: Secondary | ICD-10-CM

## 2021-04-25 DIAGNOSIS — R261 Paralytic gait: Secondary | ICD-10-CM

## 2021-04-25 NOTE — Therapy (Signed)
[facility] [place] [address] [place], [place], [address] Phone: [phone]   Fax:  [phone]  Physical Therapy Treatment  Patient Details  Name: Carrie Kline MRN: [id number] Date of Birth: 1946/03/15 Referring Provider (PT): Charlton Amor, PA-C   Encounter Date: 04/25/2021   PT End of Session - 04/25/21 1102     Visit Number 6    Number of Visits 17    Date for PT Re-Evaluation 05/26/21    Authorization Type BCBS    PT Start Time 1018    PT Stop Time 1100    PT Time Calculation (min) 42 min    Equipment Utilized During Treatment Gait belt    Activity Tolerance Patient tolerated treatment well;Patient limited by fatigue    Behavior During Therapy WFL for tasks assessed/performed             Past Medical History:  Diagnosis Date   Gout    Seasonal allergies     Past Surgical History:  Procedure Laterality Date   ABDOMINAL HYSTERECTOMY     BACK SURGERY     GANGLION CYST EXCISION      There were no vitals filed for this visit.   Subjective Assessment - 04/25/21 1019     Subjective R thumb has been giving her some issues- this has been an ongoing issue. Wearing a thumb and wrist brace.    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 No/denies                               [facility] Adult PT Treatment/Exercise - 04/25/21 0001       Ambulation/Gait   Ambulation Distance (Feet) 130 Feet    Assistive device Straight cane    Gait Pattern Step-through pattern;Decreased step length - left;Decreased stance time - right;Decreased dorsiflexion - right;Decreased weight shift to right;Poor foot clearance - right;Trunk  flexed;Right genu recurvatum   decreased R knee/hip flexion and extensor thrust evient   Stairs Yes    Gait Comments ascending/descending clinic stairs with B UE support and alternating reciprocal pattern with CGA x6   limited eccentric control and stability     Lumbar Exercises: Aerobic   Nustep L5 x6 min      Knee/Hip Exercises: Standing   Terminal Knee Extension Strengthening;Right;2 sets;10 reps;Theraband    Theraband Level (Terminal Knee Extension) Level 3 (Green)    Terminal Knee Extension Limitations 10x3"   focusing on avoiding recurvatum and compensation at hips   Forward Step Up Right;1 set;Step Height: 6";Hand Hold: 1;5 reps   + SPC in L hand   Forward Step Up Limitations cueing for SPC sequencing; heavy R UE use and R LE weakness                    PT Education - 04/25/21 1101     Education Details update to HEP- to be performed with UE support for safety; advised patient to continue using RW for safety    Person(s) Educated Patient    Methods Explanation;Demonstration;Tactile cues;Verbal cues;Handout    Comprehension Verbalized understanding;Returned demonstration  PT Short Term Goals - 04/14/21 1154       PT SHORT TERM GOAL #1   Title Patient to be independent with initial HEP.    Status Achieved               PT Long Term Goals - 04/14/21 1154       PT LONG TERM GOAL #1   Title Patient to be independent with advanced HEP.    Status On-going      PT LONG TERM GOAL #2   Title Patient to demonstrate R LE strength >/=4/5.    Status On-going                   Plan - 04/25/21 1102     Clinical Impression Statement Patient wearing thumb/wrist splint on R hand today d/t report of ongoing thumb issues; otherwise without complaints today. Worked on stair navigation with alternating reciprocal pattern which patient tolerated fairly well; B UE use required throughout. Patient reporting that she currently ascends stairs  sideways, thus worked on simulating patient's home staircase working on forward step ups with SPC. Also initiated gait training with SPC, revealing decreased R knee/hip flexion and extensor thrust. Resisted TKE was well-tolerated, thus updated this into HEP- patient reported understanding. Intermittent sitting rest breaks taken d/t fatigue today. No complaints at end of session.    Comorbidities gout, back surgery    PT Treatment/Interventions ADLs/Self Care Home Management;Cryotherapy;DME Instruction;Electrical Stimulation;Ultrasound;Moist Heat;Gait training;Stair training;Functional mobility training;Therapeutic activities;Therapeutic exercise;Balance training;Neuromuscular re-education;Patient/family education;Orthotic Fit/Training;Manual techniques;Splinting;Energy conservation;Dry needling;Passive range of motion;Taping;Vasopneumatic Device;Visual/perceptual remediation/compensation    PT Next Visit Plan progress R LE strengthening and balance    Consulted and Agree with Plan of Care Patient             Patient will benefit from skilled therapeutic intervention in order to improve the following deficits and impairments:  Abnormal gait, Decreased coordination, Decreased range of motion, Difficulty walking, Increased fascial restricitons, Increased muscle spasms, Decreased safety awareness, Decreased activity tolerance, Improper body mechanics, Impaired flexibility, Decreased balance, Postural dysfunction, Decreased strength, Decreased mobility  Visit Diagnosis: 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 [place], [place], [address] Phone: [phone]   Fax:  [phone]  Name: Carrie Kline MRN: [id number] Date of Birth: June 07, 1946

## 2021-04-26 NOTE — Therapy (Signed)
[facility] [place] [facility] [address] [place], [place], [address] Phone: [phone]   Fax:  [phone]  Occupational Therapy Treatment  Patient Details  Name: Carrie Kline MRN: [id number] Date of Birth: Aug 08, 1946 Referring Provider (OT): Mariam Dollar, PA-C   Encounter Date: 04/25/2021   OT End of Session - 04/25/21 1120     Visit Number 7    Number of Visits 17    Date for OT Re-Evaluation 06/29/21    Authorization Type BCBS Medicare    Progress Note Due on Visit 10    OT Start Time 1100    OT Stop Time 1145    OT Time Calculation (min) 45 min    Activity Tolerance Patient tolerated treatment well    Behavior During Therapy [facility] for tasks assessed/performed            Past Medical History:  Diagnosis Date   Gout    Seasonal allergies     Past Surgical History:  Procedure Laterality Date   ABDOMINAL HYSTERECTOMY     BACK SURGERY     GANGLION CYST EXCISION      There were no vitals filed for this visit.   Subjective Assessment - 04/25/21 1105     Subjective  Pt states she had a "crying session;" this is the first time she has cried since the stroke. Pt also reports her daughter has returned to work after a 30-day leave to assist w/ her recoverty and is now wearing a wrist brace when doing exercises (doesn't wear it much at home)    Patient is accompanied by: Family member   [name]   Pertinent History Hx of gout and back surgery    Patient Stated Goals "Moving my R arm"    Currently in Pain? No/denies             Treatment/Exercises - 04/25/21    Neuromuscular Reeducation     Scapular Stretching/Mobilization OT provided downward facilitation at R shoulder while pt completed ear-to-shoulder stretch toward contralateral side to facilitate improve scapular depression and decrease muscle tightness in R shoulder; also completed set of stretch w/ pt rotating and flexing head toward contralateral side. OT  incorporated myofascial stretching prn. Pt exhibits significant tightness of R upper trap.  R scapular adduction and pectoralis stretch with pt rotating head in opposite direction and reaching posteriorly w/ contralateral LUE while OT provided facilitation along body of scapula and anterior shoulder (clamshell stabilization) to facilitate scapular activation and decrease muscle tightness on R, affected side    Weight-Bearing Light weight bearing while standing at countertop through BUEs w/ AROM of scapular retraction for inhibition of tone, balance, and facilitation of muscle activation.  Pt required mod tactile/verbal cues for equal activation. Pt also completed set w/ scapular depression w/ improved success of consistent activation     Towel Slides Towel slides on tabletop w/ RUE for shoulder flex/elbow ext to facilitate increased ROM and stretch; OT provided min and decreasing verbal and tactile facilitation/cues to decrease compensatory shoulder elevation and improve shoulder alignment during flexion         OT Education - 04/25/21 1338     Education Details Continued condition-specific education, including but not limited to typical functional recovery patterns and neuro reeducation    Person(s) Educated Patient    Methods Explanation;Demonstration    Comprehension Verbalized understanding;Returned demonstration             OT Short Term Goals - 04/25/21 1140  OT SHORT TERM GOAL #1   Title Pt will improve independence w/ functional FM tasks (e.g., clothing manipulatives) as evidenced by being able to complete 9HPT w/ R hand    Baseline Completed 4 pegs in >1 min w R hand    Time 4    Period Weeks    Status Achieved   04/20/21 - 1 min, 23 sec   Target Date 04/28/21      OT SHORT TERM GOAL #2   Title Pt will improve AROM of R shoulder flexion by at least 20 degrees to facilitate improved functional reach    Baseline R shoulder flexion 28 degrees    Time 4    Period Weeks     Status On-going      OT SHORT TERM GOAL #3   Title Pt will improve AROM of R elbow extension by at least 15 degrees to facilitate improved functional reach    Baseline Achieves 52 degrees of flexion when attempting elbow extension    Time 4    Period Weeks    Status Achieved   04/18/21 - 21* of flex w/ elbow ext     OT SHORT TERM GOAL #4   Title Pt will demonstrate R hand grip to within 10# of L hand to improve independence w/ IADLs    Baseline 04/25/21 - RUE 4# (LUE 39#)    Time 4    Period Weeks    Status On-going             OT Long Term Goals - 04/03/21 0949       OT LONG TERM GOAL #1   Title Pt will be able to cut food w/ Mod I, using AE/compensatory strategies prn, 100% of the time    Baseline Frequently requires assist cutting food    Time 8    Period Weeks    Status On-going      OT LONG TERM GOAL #2   Title Pt will be able to fasten/unfasten clothing manipulatives in 5/5 trials w/ Mod I, using AE prn    Baseline Requires assist w/ fasteners    Time 8    Period Weeks    Status On-going      OT LONG TERM GOAL #3   Title Pt will demonstrate simulated tub/shower transfer safely w/ Mod I    Baseline Min A w/ tub/shower transfer    Time 8    Period Weeks    Status On-going      OT LONG TERM GOAL #4   Title Pt will demonstrate independence w/ HEP designed for coordination, ROM, strength, and functional use of RUE by d/c    Baseline No HEP at this time    Time 8    Period Weeks    Status On-going             Plan - 04/25/21 1340     Clinical Impression Statement OT continued w/ stretching of R shoulder and neck to prepare RUE ROM for improved participation in functional tasks, focusing on scapular depression and retraction. Pt demo'd improved success w/ scapular depression compared w/ retraction on R side and will benefit from wt-bearing exercises through affected UE to improve scapular stability and activation; muscle tightness and decreased scapular  retraction likely impacting ROM of shoulder flexion.    OT Occupational Profile and History Detailed Assessment- Review of Records and additional review of physical, cognitive, psychosocial history related to current functional performance    Occupational performance  deficits (Please refer to evaluation for details): ADL's;IADL's;Work;Leisure;Social Participation    Body Structure / Function / Physical Skills ADL;ROM;UE functional use;Mobility;FMC;Decreased knowledge of use of DME;Balance;Body mechanics;Dexterity;Gait;Vision;GMC;Strength;Endurance;Coordination;IADL;Tone    Rehab Potential Good    Clinical Decision Making Several treatment options, min-mod task modification necessary    Comorbidities Affecting Occupational Performance: May have comorbidities impacting occupational performance    Modification or Assistance to Complete Evaluation  Min-Moderate modification of tasks or assist with assess necessary to complete eval    OT Frequency 2x / week    OT Duration 8 weeks    OT Treatment/Interventions Self-care/ADL training;Electrical Stimulation;Therapeutic exercise;Visual/perceptual remediation/compensation;Patient/family education;Splinting;Neuromuscular education;Moist Heat;Aquatic Therapy;Biofeedback;Energy conservation;Building services engineer;Therapeutic activities;Balance training;Passive range of motion;Manual Therapy;DME and/or AE instruction;Ultrasound;Cryotherapy    Plan Observe simulated TTB due to pt's report of increased participation at home (LTG3); continue scapular stretching and stabilization (depression, body on arm mobilization, adduction); introduce RUE weight bearing?    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 [place], [place], [address] Phone: [phone]   Fax:  [phone]  Name: Carrin Vannostrand MRN: [id number] Date of Birth: 03-04-1946

## 2021-04-27 ENCOUNTER — Other Ambulatory Visit: Payer: Self-pay

## 2021-04-27 ENCOUNTER — Encounter: Payer: Self-pay | Admitting: Occupational Therapy

## 2021-04-27 ENCOUNTER — Ambulatory Visit: Payer: Medicare Other | Admitting: Occupational Therapy

## 2021-04-27 ENCOUNTER — Ambulatory Visit: Payer: Medicare Other | Attending: Physician Assistant | Admitting: Rehabilitative and Restorative Service Providers"

## 2021-04-27 ENCOUNTER — Encounter: Payer: Self-pay | Admitting: Rehabilitative and Restorative Service Providers"

## 2021-04-27 DIAGNOSIS — I69353 Hemiplegia and hemiparesis 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: Secondary | ICD-10-CM | POA: Diagnosis present

## 2021-04-27 DIAGNOSIS — I639 Cerebral infarction, unspecified: Secondary | ICD-10-CM | POA: Insufficient documentation

## 2021-04-27 DIAGNOSIS — R278 Other lack of coordination: Secondary | ICD-10-CM | POA: Insufficient documentation

## 2021-04-27 DIAGNOSIS — R2681 Unsteadiness on feet: Secondary | ICD-10-CM | POA: Insufficient documentation

## 2021-04-27 DIAGNOSIS — M6281 Muscle weakness (generalized): Secondary | ICD-10-CM | POA: Insufficient documentation

## 2021-04-27 DIAGNOSIS — R261 Paralytic gait: Secondary | ICD-10-CM | POA: Diagnosis present

## 2021-04-27 NOTE — Therapy (Signed)
[facility] [address] [place], [place], [address] Phone: [phone]   Fax:  [phone]  Occupational Therapy Treatment  Patient Details  Name: Carrie Kline MRN: [id number] Date of Birth: 08-Jun-1946 Referring Provider (OT): Mariam Dollar, PA-C   Encounter Date: 04/27/2021   OT End of Session - 04/27/21 0932     Visit Number 8    Number of Visits 17    Date for OT Re-Evaluation 06/29/21    Authorization Type BCBS Medicare    Progress Note Due on Visit 10    OT Start Time 0930    OT Stop Time 1010    OT Time Calculation (min) 40 min    Activity Tolerance Patient tolerated treatment well    Behavior During Therapy [facility] for tasks assessed/performed            Past Medical History:  Diagnosis Date   Gout    Seasonal allergies     Past Surgical History:  Procedure Laterality Date   ABDOMINAL HYSTERECTOMY     BACK SURGERY     GANGLION CYST EXCISION      There were no vitals filed for this visit.   Subjective Assessment - 04/27/21 0931     Subjective  "My shoulder (R) feels tight this morning"    Patient is accompanied by: Family member   [name]   Pertinent History Hx of gout and back surgery    Patient Stated Goals "Moving my R arm"    Currently in Pain? No/denies             Treatment/Exercises - 04/27/21    Manual Therapy Gentle soft tissue mobilization w/ lotion to R upper and mid trap in order to facilitate mobility within tissue structures and decrease tension and stiffness; pt positioned seated EOM and tolerated intervention w/out discomfort    Weight Bearing Light weight bearing through forearms on tabletop while seated EOM w/ slight forward trunk flexion toward R side for inhibition of tone and facilitation of muscle activation through RUE. OT provided tactile cues and incorporated visual aid (tall mirror) for alignment  Light wt. bearing through R elbow/forearm on mat with trunk  rotation body on arm movements for pec stretch and active scapular adduction w/ min cueing/facilitation for shoulder alignment    Scapular Stabilization Scapular retraction and scapular depression completed sitting EOM; completed 2 sets of 10 reps each exercise. Retraction exercise performed w/ elbows flexed sitting EOM. OT positioned tall mirror in front of pt as visual aid for equal activation         OT Education - 04/27/21 1320     Education Details Reviewed HEP exercises    Person(s) Educated Patient    Methods Explanation;Demonstration;Handout;Tactile cues;Verbal cues    Comprehension Verbalized understanding;Returned demonstration             OT Short Term Goals - 04/25/21 1140       OT SHORT TERM GOAL #1   Title Pt will improve independence w/ functional FM tasks (e.g., clothing manipulatives) as evidenced by being able to complete 9HPT w/ R hand    Baseline Completed 4 pegs in >1 min w R hand    Time 4    Period Weeks    Status Achieved   04/20/21 - 1 min, 23 sec   Target Date 04/28/21      OT SHORT TERM GOAL #2   Title Pt will improve AROM of R shoulder flexion by at least  20 degrees to facilitate improved functional reach    Baseline R shoulder flexion 28 degrees    Time 4    Period Weeks    Status On-going      OT SHORT TERM GOAL #3   Title Pt will improve AROM of R elbow extension by at least 15 degrees to facilitate improved functional reach    Baseline Achieves 52 degrees of flexion when attempting elbow extension    Time 4    Period Weeks    Status Achieved   04/18/21 - 21* of flex w/ elbow ext     OT SHORT TERM GOAL #4   Title Pt will demonstrate R hand grip to within 10# of L hand to improve independence w/ IADLs    Baseline 04/25/21 - RUE 4# (LUE 39#)    Time 4    Period Weeks    Status On-going             OT Long Term Goals - 04/03/21 0949       OT LONG TERM GOAL #1   Title Pt will be able to cut food w/ Mod I, using AE/compensatory  strategies prn, 100% of the time    Baseline Frequently requires assist cutting food    Time 8    Period Weeks    Status On-going      OT LONG TERM GOAL #2   Title Pt will be able to fasten/unfasten clothing manipulatives in 5/5 trials w/ Mod I, using AE prn    Baseline Requires assist w/ fasteners    Time 8    Period Weeks    Status On-going      OT LONG TERM GOAL #3   Title Pt will demonstrate simulated tub/shower transfer safely w/ Mod I    Baseline Min A w/ tub/shower transfer    Time 8    Period Weeks    Status On-going      OT LONG TERM GOAL #4   Title Pt will demonstrate independence w/ HEP designed for coordination, ROM, strength, and functional use of RUE by d/c    Baseline No HEP at this time    Time 8    Period Weeks    Status On-going             Plan - 04/27/21 0932     Clinical Impression Statement Pt continues to exhibit increased tightness of levator and upper trapezius on R side, which is likely impacting limitation of RUE ROM and shoulder flexion. Pt will continue to benefit from stretching in preparation for functional alignment/control w/ ROM. Weight bearing exercises initiated this session to facilitate muscle activation and scapular stabilization w/ pt demo'ing good alignment and control throughout exercises.    OT Occupational Profile and History Detailed Assessment- Review of Records and additional review of physical, cognitive, psychosocial history related to current functional performance    Occupational performance deficits (Please refer to evaluation for details): ADL's;IADL's;Work;Leisure;Social Participation    Body Structure / Function / Physical Skills ADL;ROM;UE functional use;Mobility;FMC;Decreased knowledge of use of DME;Balance;Body mechanics;Dexterity;Gait;Vision;GMC;Strength;Endurance;Coordination;IADL;Tone    Rehab Potential Good    Clinical Decision Making Several treatment options, min-mod task modification necessary    Comorbidities  Affecting Occupational Performance: May have comorbidities impacting occupational performance    Modification or Assistance to Complete Evaluation  Min-Moderate modification of tasks or assist with assess necessary to complete eval    OT Frequency 2x / week    OT Duration 8 weeks  OT Treatment/Interventions Self-care/ADL training;Electrical Stimulation;Therapeutic exercise;Visual/perceptual remediation/compensation;Patient/family education;Splinting;Neuromuscular education;Moist Heat;Aquatic Therapy;Biofeedback;Energy conservation;Building services engineer;Therapeutic activities;Balance training;Passive range of motion;Manual Therapy;DME and/or AE instruction;Ultrasound;Cryotherapy    Plan Observe simulated TTB due to pt's report of increased participation at home (LTG3); continue scapular stretching and stabilization (depression, body on arm mobilization, adduction, weight bearing); closed-chain AROM in supine    Consulted and Agree with Plan of Care Patient;Family member/caregiver    Family Member Consulted Carrie Kline (daughter)             Patient will benefit from skilled therapeutic intervention in order to improve the following deficits and impairments:   Body Structure / Function / Physical Skills: ADL, ROM, UE functional use, Mobility, FMC, Decreased knowledge of use of DME, Balance, Body mechanics, Dexterity, Gait, Vision, GMC, Strength, Endurance, Coordination, IADL, Tone   Visit Diagnosis: 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 [place], [place], [address] Phone: [phone]   Fax:  [phone]  Name: Carrie Kline MRN: [id number] Date of Birth: 07-02-1946

## 2021-04-27 NOTE — Patient Instructions (Addendum)
Scapular Retraction: Elbow Flexion (Standing)    With elbows bent to about 90, pinch shoulder blades together and rotate arms out, keeping elbows bent. Repeat 15 times per set. Do 2 sets per session. Do 2-3 sessions per day.    Copyright  VHI. All rights reserved.

## 2021-04-27 NOTE — Therapy (Signed)
[facility] [place] [facility] [address] [place], [place], [address] Phone: [phone]   Fax:  [phone]  Physical Therapy Treatment  Patient Details  Name: Carrie Kline MRN: [id number] Date of Birth: 1945/12/20 Referring Provider (PT): Carrie Amor, PA-C   Encounter Date: 04/27/2021   PT End of Session - 04/27/21 1015     Visit Number 7    Number of Visits 17    Date for PT Re-Evaluation 05/26/21    Authorization Type BCBS    PT Start Time 1014    PT Stop Time 1055    PT Time Calculation (min) 41 min    Activity Tolerance Patient tolerated treatment well    Behavior During Therapy [facility] for tasks assessed/performed             Past Medical History:  Diagnosis Date   Gout    Seasonal allergies     Past Surgical History:  Procedure Laterality Date   ABDOMINAL HYSTERECTOMY     BACK SURGERY     GANGLION CYST EXCISION      There were no vitals filed for this visit.   Subjective Assessment - 04/27/21 1014     Subjective My shoulder is not as stiff as when I got here.    Patient Stated Goals be able to walk without AD    Currently in Pain? No/denies                               [facility] Adult PT Treatment/Exercise - 04/27/21 0001       Transfers   Five time sit to stand comments  30.2 sec      Ambulation/Gait   Ambulation Distance (Feet) 140 Feet   2 x140 ft with seated recovery period in between.   Assistive device Straight cane    Gait Pattern Step-through pattern;Decreased step length - left;Decreased stance time - right;Decreased dorsiflexion - right;Decreased weight shift to right;Poor foot clearance - right;Trunk flexed;Right genu recurvatum    Ambulation Surface Level;Indoor      Lumbar Exercises: Aerobic   Nustep L5 x6 min      Lumbar Exercises: Standing   Heel Raises 20 reps;1 second    Functional Squats 10 reps    Other Standing Lumbar Exercises Alt LE taps on 4" step 2x10     Other Standing Lumbar Exercises Marching with RW for support 2x10 B      Lumbar Exercises: Seated   Long Arc Quad on Chair Strengthening;Both;2 sets;10 reps   requires cuing with RLE for improving knee extension and to control eccentric contraction   LAQ on Chair Weights (lbs) 2    Sit to Stand 10 reps    Sit to Stand Limitations no UE support; cueing to scoot to EOM and place R foot back further for increased challenge    Other Seated Lumbar Exercises Marching 2# 2x10 B, hip abd with red tband 2x10, ball squeeze 2x10                      PT Short Term Goals - 04/14/21 1154       PT SHORT TERM GOAL #1   Title Patient to be independent with initial HEP.    Status Achieved               PT Long Term Goals - 04/27/21 1104       PT LONG  TERM GOAL #1   Title Patient to be independent with advanced HEP.    Status On-going      PT LONG TERM GOAL #2   Title Patient to demonstrate R LE strength >/=4/5.    Status On-going      PT LONG TERM GOAL #3   Title Patient to complete TUG in <14 sec with LRAD in order to decrease risk of falls.    Status On-going      PT LONG TERM GOAL #4   Title Patient to complete 5xSTS in <15 sec in order to decrease risk of falls.    Status On-going      PT LONG TERM GOAL #5   Title Patient to score atleast 47/56 on Berg in order to decrease risk of falls.    Status On-going                   Plan - 04/27/21 1100     Clinical Impression Statement [name] tolerated session and ther ex well today.  She was able to progress with increased abulation with SPC today, but does continue to require cuing to increase weight bearing and use of RLE during stance phase.  Pt cued during toe tap on stair exercise for increased R hip flexion and inceased weight bearing through RLE when opposite LE was doing tapping.  This will improve pts balance and her ability to functionally complete the steps.    PT Treatment/Interventions ADLs/Self  Care Home Management;Cryotherapy;DME Instruction;Electrical Stimulation;Ultrasound;Moist Heat;Gait training;Stair training;Functional mobility training;Therapeutic activities;Therapeutic exercise;Balance training;Neuromuscular re-education;Patient/family education;Orthotic Fit/Training;Manual techniques;Splinting;Energy conservation;Dry needling;Passive range of motion;Taping;Vasopneumatic Device;Visual/perceptual remediation/compensation    PT Next Visit Plan progress R LE strengthening and balance    Consulted and Agree with Plan of Care Patient             Patient will benefit from skilled therapeutic intervention in order to improve the following deficits and impairments:  Abnormal gait, Decreased coordination, Decreased range of motion, Difficulty walking, Increased fascial restricitons, Increased muscle spasms, Decreased safety awareness, Decreased activity tolerance, Improper body mechanics, Impaired flexibility, Decreased balance, Postural dysfunction, Decreased strength, Decreased mobility  Visit Diagnosis: 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 [place], [place], [address] Phone: [phone]   Fax:  [phone]  Name: Carrie Kline MRN: [id number] Date of Birth: 11-13-1945

## 2021-05-02 ENCOUNTER — Ambulatory Visit: Payer: Medicare Other | Admitting: Physical Therapy

## 2021-05-02 ENCOUNTER — Other Ambulatory Visit: Payer: Self-pay

## 2021-05-02 ENCOUNTER — Encounter: Payer: Self-pay | Admitting: Occupational Therapy

## 2021-05-02 ENCOUNTER — Encounter: Payer: Self-pay | Admitting: Physical Therapy

## 2021-05-02 ENCOUNTER — Ambulatory Visit: Payer: Medicare Other | Admitting: Occupational Therapy

## 2021-05-02 DIAGNOSIS — R2681 Unsteadiness on feet: Secondary | ICD-10-CM

## 2021-05-02 DIAGNOSIS — M6281 Muscle weakness (generalized): Secondary | ICD-10-CM

## 2021-05-02 DIAGNOSIS — R261 Paralytic gait: Secondary | ICD-10-CM

## 2021-05-02 DIAGNOSIS — R278 Other lack of coordination: Secondary | ICD-10-CM

## 2021-05-02 DIAGNOSIS — I69353 Hemiplegia and hemiparesis 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: Secondary | ICD-10-CM | POA: Diagnosis not present

## 2021-05-02 NOTE — Therapy (Signed)
[facility] [address] [place], [place], [address] Phone: [phone]   Fax:  [phone]  Physical Therapy Treatment  Patient Details  Name: Carrie Kline MRN: [id number] Date of Birth: 04/13/1946 Referring Provider (PT): Charlton Amor, PA-C   Encounter Date: 05/02/2021   PT End of Session - 05/02/21 1148     Visit Number 8    Number of Visits 17    Date for PT Re-Evaluation 05/26/21    Authorization Type BCBS    PT Start Time 1101    PT Stop Time 1144    PT Time Calculation (min) 43 min    Equipment Utilized During Treatment Gait belt    Activity Tolerance Patient tolerated treatment well;Patient limited by fatigue    Behavior During Therapy WFL for tasks assessed/performed             Past Medical History:  Diagnosis Date   Gout    Seasonal allergies     Past Surgical History:  Procedure Laterality Date   ABDOMINAL HYSTERECTOMY     BACK SURGERY     GANGLION CYST EXCISION      There were no vitals filed for this visit.   Subjective Assessment - 05/02/21 1104     Subjective Noticing new onset of R LE N/T and continued improvement in R LE strength. Feels like her strength and sensation are returning. Daughter bought her a cane.    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 No/denies                               [facility] Adult PT Treatment/Exercise - 05/02/21 0001       Ambulation/Gait   Ambulation Distance (Feet) 110 Feet    Assistive device Straight cane    Gait Pattern Step-through pattern;Decreased step length - left;Decreased stance time - right;Decreased dorsiflexion - right;Decreased weight shift to right;Poor foot clearance -  right;Trunk flexed;Right genu recurvatum    Ambulation Surface Level;Indoor    Stairs Yes    Gait Comments ascending/descending clinic stairs with SPC and R UE support R LE-dominant pattern with CGA x2      Neuro Re-ed    Neuro Re-ed Details  L foot tap on 6" with SPC 2x10; ant/pos wt shift on foam in II bars x20      Lumbar Exercises: Stretches   Other Lumbar Stretch Exercise L diagonal prayer stretch 10x5"      Lumbar Exercises: Aerobic   Nustep L5 x6 min      Manual Therapy   Manual Therapy Soft tissue mobilization;Myofascial release    Manual therapy comments sititng    Soft tissue mobilization STM to R QL    Myofascial Release manual TPR to R QL   soft tissue restriction and c/o TTP                   PT Education - 05/02/21 1147     Education Details edu on using towel slides to stretch R QL    Person(s) Educated Patient    Methods Explanation;Demonstration;Tactile cues;Verbal cues    Comprehension Verbalized understanding;Returned demonstration  PT Short Term Goals - 04/14/21 1154       PT SHORT TERM GOAL #1   Title Patient to be independent with initial HEP.    Status Achieved               PT Long Term Goals - 04/27/21 1104       PT LONG TERM GOAL #1   Title Patient to be independent with advanced HEP.    Status On-going      PT LONG TERM GOAL #2   Title Patient to demonstrate R LE strength >/=4/5.    Status On-going      PT LONG TERM GOAL #3   Title Patient to complete TUG in <14 sec with LRAD in order to decrease risk of falls.    Status On-going      PT LONG TERM GOAL #4   Title Patient to complete 5xSTS in <15 sec in order to decrease risk of falls.    Status On-going      PT LONG TERM GOAL #5   Title Patient to score atleast 47/56 on Berg in order to decrease risk of falls.    Status On-going                   Plan - 05/02/21 1148     Clinical Impression Statement Patient arrived to session with  report of return of R LE sensation and N/T as well as continued strength improvement. Patient required intermittent sitting rest breaks between standing exercises d/t fatigue today. R extensor thrust with gait training with SPC was slightly less evident today and patient demonstrates good stability when ambulating with this device. Worked on stair navigation with SPC and single handrail with patient requiring cues for [facility] [place] sequencing and demonstrate increased challenge/weakness with ascending vs. descending. Patient struggled to perform weight shifting on compliant surface- likely d/t ankle weakness and apprehension. Noted R QL pain after standing ther-ex, which was addressed with stretching and STM. Also advised patient to try heat to this muscle group for 10 min at a time at home for improved pain. Patient reported understanding and without complaints at end of session.    Comorbidities gout, back surgery    PT Treatment/Interventions ADLs/Self Care Home Management;Cryotherapy;DME Instruction;Electrical Stimulation;Ultrasound;Moist Heat;Gait training;Stair training;Functional mobility training;Therapeutic activities;Therapeutic exercise;Balance training;Neuromuscular re-education;Patient/family education;Orthotic Fit/Training;Manual techniques;Splinting;Energy conservation;Dry needling;Passive range of motion;Taping;Vasopneumatic Device;Visual/perceptual remediation/compensation    PT Next Visit Plan progress R LE strengthening and balance    Consulted and Agree with Plan of Care Patient             Patient will benefit from skilled therapeutic intervention in order to improve the following deficits and impairments:  Abnormal gait, Decreased coordination, Decreased range of motion, Difficulty walking, Increased fascial restricitons, Increased muscle spasms, Decreased safety awareness, Decreased activity tolerance, Improper body mechanics, Impaired flexibility, Decreased balance, Postural dysfunction,  Decreased strength, Decreased mobility  Visit Diagnosis: 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 [place], [place], [address] Phone: [phone]   Fax:  [phone]  Name: Alesi  Hosie MRN: [id number] Date of Birth: [birth year]

## 2021-05-02 NOTE — Therapy (Signed)
[facility] [address] [place], [place], [address] Phone: [phone]   Fax:  [phone]  Physical Therapy Treatment  Patient Details  Name: Carrie Kline MRN: [id number] Date of Birth: 04/13/1946 Referring Provider (PT): Charlton Amor, PA-C   Encounter Date: 05/02/2021   PT End of Session - 05/02/21 1148     Visit Number 8    Number of Visits 17    Date for PT Re-Evaluation 05/26/21    Authorization Type BCBS    PT Start Time 1101    PT Stop Time 1144    PT Time Calculation (min) 43 min    Equipment Utilized During Treatment Gait belt    Activity Tolerance Patient tolerated treatment well;Patient limited by fatigue    Behavior During Therapy WFL for tasks assessed/performed             Past Medical History:  Diagnosis Date   Gout    Seasonal allergies     Past Surgical History:  Procedure Laterality Date   ABDOMINAL HYSTERECTOMY     BACK SURGERY     GANGLION CYST EXCISION      There were no vitals filed for this visit.   Subjective Assessment - 05/02/21 1104     Subjective Noticing new onset of R LE N/T and continued improvement in R LE strength. Feels like her strength and sensation are returning. Daughter bought her a cane.    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 No/denies                               [facility] Adult PT Treatment/Exercise - 05/02/21 0001       Ambulation/Gait   Ambulation Distance (Feet) 110 Feet    Assistive device Straight cane    Gait Pattern Step-through pattern;Decreased step length - left;Decreased stance time - right;Decreased dorsiflexion - right;Decreased weight shift to right;Poor foot clearance -  right;Trunk flexed;Right genu recurvatum    Ambulation Surface Level;Indoor    Stairs Yes    Gait Comments ascending/descending clinic stairs with SPC and R UE support R LE-dominant pattern with CGA x2      Neuro Re-ed    Neuro Re-ed Details  L foot tap on 6" with SPC 2x10; ant/pos wt shift on foam in II bars x20      Lumbar Exercises: Stretches   Other Lumbar Stretch Exercise L diagonal prayer stretch 10x5"      Lumbar Exercises: Aerobic   Nustep L5 x6 min      Manual Therapy   Manual Therapy Soft tissue mobilization;Myofascial release    Manual therapy comments sititng    Soft tissue mobilization STM to R QL    Myofascial Release manual TPR to R QL   soft tissue restriction and c/o TTP                   PT Education - 05/02/21 1147     Education Details edu on using towel slides to stretch R QL    Person(s) Educated Patient    Methods Explanation;Demonstration;Tactile cues;Verbal cues    Comprehension Verbalized understanding;Returned demonstration  PT Short Term Goals - 04/14/21 1154       PT SHORT TERM GOAL #1   Title Patient to be independent with initial HEP.    Status Achieved               PT Long Term Goals - 04/27/21 1104       PT LONG TERM GOAL #1   Title Patient to be independent with advanced HEP.    Status On-going      PT LONG TERM GOAL #2   Title Patient to demonstrate R LE strength >/=4/5.    Status On-going      PT LONG TERM GOAL #3   Title Patient to complete TUG in <14 sec with LRAD in order to decrease risk of falls.    Status On-going      PT LONG TERM GOAL #4   Title Patient to complete 5xSTS in <15 sec in order to decrease risk of falls.    Status On-going      PT LONG TERM GOAL #5   Title Patient to score atleast 47/56 on Berg in order to decrease risk of falls.    Status On-going                   Plan - 05/02/21 1148     Clinical Impression Statement Patient arrived to session with  report of return of R LE sensation and N/T as well as continued strength improvement. Patient required intermittent sitting rest breaks between standing exercises d/t fatigue today. R extensor thrust with gait training with SPC was slightly less evident today and patient demonstrates good stability when ambulating with this device. Worked on stair navigation with SPC and single handrail with patient requiring cues for [facility] sequencing and demonstrate increased challenge/weakness with ascending vs. descending. Patient struggled to perform weight shifting on compliant surface- likely d/t ankle weakness and apprehension. Noted R QL pain after standing ther-ex, which was addressed with stretching and STM. Also advised patient to try heat to this muscle group for 10 min at a time at home for improved pain. Patient reported understanding and without complaints at end of session.    Comorbidities gout, back surgery    PT Treatment/Interventions ADLs/Self Care Home Management;Cryotherapy;DME Instruction;Electrical Stimulation;Ultrasound;Moist Heat;Gait training;Stair training;Functional mobility training;Therapeutic activities;Therapeutic exercise;Balance training;Neuromuscular re-education;Patient/family education;Orthotic Fit/Training;Manual techniques;Splinting;Energy conservation;Dry needling;Passive range of motion;Taping;Vasopneumatic Device;Visual/perceptual remediation/compensation    PT Next Visit Plan progress R LE strengthening and balance    Consulted and Agree with Plan of Care Patient             Patient will benefit from skilled therapeutic intervention in order to improve the following deficits and impairments:  Abnormal gait, Decreased coordination, Decreased range of motion, Difficulty walking, Increased fascial restricitons, Increased muscle spasms, Decreased safety awareness, Decreased activity tolerance, Improper body mechanics, Impaired flexibility, Decreased balance, Postural dysfunction,  Decreased strength, Decreased mobility  Visit Diagnosis: 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 [place], [place], [address] Phone: [phone]   Fax:  [phone]  Name: Anasha Perfecto MRN: [id number]  Date of Birth: 08-Jan-1946

## 2021-05-02 NOTE — Therapy (Signed)
[place]. [place], [place], [address] Phone: [phone]   Fax:  [phone]  Occupational Therapy Treatment  Patient Details  Name: Carrie Kline MRN: [id number] Date of Birth: 12/15/1945 Referring Provider (OT): Lauraine Rinne, PA-C   Encounter Date: 05/02/2021   OT End of Session - 05/02/21 1030     Visit Number 9    Number of Visits 17    Date for OT Re-Evaluation 06/29/21    Authorization Type BCBS Medicare    Progress Note Due on Visit 10    OT Start Time 1016    OT Stop Time 1058    OT Time Calculation (min) 42 min    Activity Tolerance Patient tolerated treatment well    Behavior During Therapy [facility] for tasks assessed/performed            Past Medical History:  Diagnosis Date   Gout    Seasonal allergies     Past Surgical History:  Procedure Laterality Date   ABDOMINAL HYSTERECTOMY     BACK SURGERY     GANGLION CYST EXCISION      There were no vitals filed for this visit.   Subjective Assessment - 05/02/21 1027     Subjective  Pt reports a new sensation in her RLE recently; feels like her "leg is starting to wake up"    Patient is accompanied by: Family member   daughter [name])   Pertinent History Hx of gout and back surgery    Patient Stated Goals "Moving my R arm"    Currently in Pain? No/denies             Treatment/Exercises - 05/02/21    Neuromuscular Reeducation     Manual Therapy OT provided downward facilitation at R shoulder while pt completed ear-to-shoulder stretch toward contralateral side to facilitate improve scapular depression and decrease muscle tightness in R shoulder; OT incorporated myofascial stretching prn    Towel Slides BUE forward towel slides on tabletop for shoulder flex/elbow ext to facilitate increased ROM and stretch; completed 5 sets of 5 reps. OT provided min verbal cues to decrease shoulder elevation maintain alignment throughout exercise.      Scapular AROM: Seated Attempted shoulder rolls forward to back for increased activation of R scapular elevation, retraction, adduction, and depression; difficulty w/ sequencing smoothness of movement and OT graded activity down to isolated scapular retraction w/ increased success    Elbow AROM: Seated Closed-chain RUE elbow extension using single-point cane for increased ROM and muscle activation; OT provided tactile cues to fully engage extensors each rep  OT-facilitated PROM of elbow ext and elbow ext against graded resistance to increase muscle activation. Completed 10 reps w/ positive results; mildly decreased extension due to tightness         OT Education - 05/02/21 1029     Education Details Continued condition-specific education; updated HEP (upper trap and pec stretching, scapular adduction, weight-bearing, bilateral towel slides, elbow extension w/ cane)    Person(s) Educated Patient    Methods Explanation;Demonstration;Tactile cues;Verbal cues    Comprehension Verbalized understanding;Returned demonstration;Tactile cues required             OT Short Term Goals - 05/02/21 1558       OT SHORT TERM GOAL #1   Title Pt will improve independence w/ functional FM tasks (e.g., clothing manipulatives) as evidenced by being able to complete 9HPT w/ R hand    Baseline Completed 4 pegs in >1 min  w R hand    Time 4    Period Weeks    Status Achieved   04/20/21 - 1 min, 23 sec   Target Date 04/28/21      OT SHORT TERM GOAL #2   Title Pt will improve AROM of R shoulder flexion by at least 20 degrees to facilitate improved functional reach    Baseline R shoulder flexion 28 degrees    Time 4    Period Weeks    Status Partially Met   05/02/21 - 52 degress of scaption     OT SHORT TERM GOAL #3   Title Pt will improve AROM of R elbow extension by at least 15 degrees to facilitate improved functional reach    Baseline Achieves 52 degrees of flexion when attempting elbow extension    Time  4    Period Weeks    Status Achieved   04/18/21 - 21* of flex w/ elbow ext     OT SHORT TERM GOAL #4   Title Pt will demonstrate R hand grip to within 10# of L hand to improve independence w/ IADLs    Baseline 04/25/21 - RUE 4# (LUE 39#)    Time 4    Period Weeks    Status On-going             OT Long Term Goals - 04/03/21 0949       OT LONG TERM GOAL #1   Title Pt will be able to cut food w/ Mod I, using AE/compensatory strategies prn, 100% of the time    Baseline Frequently requires assist cutting food    Time 8    Period Weeks    Status On-going      OT LONG TERM GOAL #2   Title Pt will be able to fasten/unfasten clothing manipulatives in 5/5 trials w/ Mod I, using AE prn    Baseline Requires assist w/ fasteners    Time 8    Period Weeks    Status On-going      OT LONG TERM GOAL #3   Title Pt will demonstrate simulated tub/shower transfer safely w/ Mod I    Baseline Min A w/ tub/shower transfer    Time 8    Period Weeks    Status On-going      OT LONG TERM GOAL #4   Title Pt will demonstrate independence w/ HEP designed for coordination, ROM, strength, and functional use of RUE by d/c    Baseline No HEP at this time    Time 8    Period Weeks    Status On-going             Plan - 05/02/21 1558     Clinical Impression Statement Pt demo's decreased upper trap stiffness this session and reports good carryover of exercises and stretches at home. Due to improved shoulder ROM and understanding of positioning during shoulder flexion exercises, OT initiated towel slides for pt to complete at home w/ pt returning demo w/ no add'l pain and good ROM. Pt demo'd decreased elbow extension during exercise, so OT facilitated isolated elbow extension using walking cane to be included in HEP. Pt will benefit from add'l elbow stretches due to increased tightness observed and palpated w/ attempted elbow extension.    OT Occupational Profile and History Detailed Assessment- Review  of Records and additional review of physical, cognitive, psychosocial history related to current functional performance    Occupational performance deficits (Please refer to evaluation for  details): ADL's;IADL's;Work;Leisure;Social Participation    Body Structure / Function / Physical Skills ADL;ROM;UE functional use;Mobility;FMC;Decreased knowledge of use of DME;Balance;Body mechanics;Dexterity;Gait;Vision;GMC;Strength;Endurance;Coordination;IADL;Tone    Rehab Potential Good    Clinical Decision Making Several treatment options, min-mod task modification necessary    Comorbidities Affecting Occupational Performance: May have comorbidities impacting occupational performance    Modification or Assistance to Complete Evaluation  Min-Moderate modification of tasks or assist with assess necessary to complete eval    OT Frequency 2x / week    OT Duration 8 weeks    OT Treatment/Interventions Self-care/ADL training;Electrical Stimulation;Therapeutic exercise;Visual/perceptual remediation/compensation;Patient/family education;Splinting;Neuromuscular education;Moist Heat;Aquatic Therapy;Biofeedback;Energy conservation;Therapist, nutritional;Therapeutic activities;Balance training;Passive range of motion;Manual Therapy;DME and/or AE instruction;Ultrasound;Cryotherapy    Plan Observe simulated TTB due to pt's report of increased participation at home (LTG3); elbow extension PROM/stretching; closed-chain shoulder ROM in supine; continue scapular stretching and stabilization (depression, body on arm mobilization, adduction, weight bearing)    Consulted and Agree with Plan of Care Patient;Family member/caregiver    Family Member Consulted Kim (daughter)             Patient will benefit from skilled therapeutic intervention in order to improve the following deficits and impairments:   Body Structure / Function / Physical Skills: ADL, ROM, UE functional use, Mobility, FMC, Decreased knowledge of use of  DME, Balance, Body mechanics, Dexterity, Gait, Vision, GMC, Strength, Endurance, Coordination, IADL, Tone   Visit Diagnosis: 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 [place], [place], [address] Phone: [phone]   Fax:  [phone]  Name: Carrie Kline MRN: [id number] Date of Birth: Feb 27, 1946

## 2021-05-04 ENCOUNTER — Ambulatory Visit: Payer: Medicare Other | Admitting: Occupational Therapy

## 2021-05-04 ENCOUNTER — Encounter: Payer: Self-pay | Admitting: Rehabilitative and Restorative Service Providers"

## 2021-05-04 ENCOUNTER — Ambulatory Visit: Payer: Medicare Other | Admitting: Rehabilitative and Restorative Service Providers"

## 2021-05-04 ENCOUNTER — Encounter: Payer: Self-pay | Admitting: Occupational Therapy

## 2021-05-04 ENCOUNTER — Other Ambulatory Visit: Payer: Self-pay

## 2021-05-04 DIAGNOSIS — M6281 Muscle weakness (generalized): Secondary | ICD-10-CM

## 2021-05-04 DIAGNOSIS — I69353 Hemiplegia and hemiparesis 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: Secondary | ICD-10-CM

## 2021-05-04 DIAGNOSIS — R278 Other lack of coordination: Secondary | ICD-10-CM

## 2021-05-04 DIAGNOSIS — R261 Paralytic gait: Secondary | ICD-10-CM

## 2021-05-04 DIAGNOSIS — R2681 Unsteadiness on feet: Secondary | ICD-10-CM

## 2021-05-04 NOTE — Therapy (Signed)
[place]. [place], [place], [address] Phone: [phone]   Fax:  [phone]  Physical Therapy Treatment  Patient Details  Name: Carrie Kline MRN: [id number] Date of Birth: 1946/02/04 Referring Provider (PT): Cathlyn Parsons, PA-C   Encounter Date: 05/04/2021   PT End of Session - 05/04/21 1025     Visit Number 9    Number of Visits 17    Date for PT Re-Evaluation 05/26/21    Authorization Type BCBS    PT Start Time 1018    PT Stop Time 1100    PT Time Calculation (min) 42 min    Equipment Utilized During Treatment Gait belt    Activity Tolerance Patient tolerated treatment well;Patient limited by fatigue    Behavior During Therapy WFL for tasks assessed/performed             Past Medical History:  Diagnosis Date   Gout    Seasonal allergies     Past Surgical History:  Procedure Laterality Date   ABDOMINAL HYSTERECTOMY     BACK SURGERY     GANGLION CYST EXCISION      There were no vitals filed for this visit.   Subjective Assessment - 05/04/21 1024     Subjective Pt states that she is feeling some nerve sensations running down her RLE.    Patient Stated Goals be able to walk without AD    Currently in Pain? No/denies                               [facility] Adult PT Treatment/Exercise - 05/04/21 0001       Transfers   Five time sit to stand comments  14 sec   from black mat without UE use     Ambulation/Gait   Ambulation Distance (Feet) 200 Feet    Assistive device Straight cane    Gait Pattern Step-through pattern;Decreased step length - left;Decreased stance time - right;Decreased dorsiflexion - right;Decreased weight shift to right;Poor foot clearance - right;Trunk flexed;Right genu recurvatum    Ambulation Surface Level;Indoor    Stairs Yes    Gait Comments ascending/descending clinic stairs with SPC and R UE support R LE-dominant pattern with SBA x2       Lumbar Exercises: Aerobic   Nustep L5 x6 min      Lumbar Exercises: Standing   Heel Raises 20 reps;1 second    Functional Squats 10 reps    Functional Squats Limitations 2x10    Other Standing Lumbar Exercises Alt LE taps on 6" step 2x10    Other Standing Lumbar Exercises Marching with RW for support 2x10 B      Lumbar Exercises: Seated   Long Arc Quad on Chair Strengthening;Both;2 sets;10 reps    LAQ on Chair Weights (lbs) 2.5                       PT Short Term Goals - 04/14/21 1154       PT SHORT TERM GOAL #1   Title Patient to be independent with initial HEP.    Status Achieved               PT Long Term Goals - 05/04/21 1135       PT LONG TERM GOAL #1   Title Patient to be independent with advanced HEP.    Status On-going  PT LONG TERM GOAL #2   Title Patient to demonstrate R LE strength >/=4/5.    Status On-going      PT LONG TERM GOAL #4   Title Patient to complete 5xSTS in <15 sec in order to decrease risk of falls.    Status Partially Met                   Plan - 05/04/21 1035     Clinical Impression Statement Ms Vondra continues to state that she feels like she is getting stronger and has more sensation in her RLE.  Pt brought her new SPC from home and this PT was able to assist in sizing and pt utilized the [facility] to ambulate around PT clinic for the rest of the session.  She required cuing for pursed lip breathing during standing exercises, as she was dyspnic.  She continues to progress towards goal related activities and tolerates TE without complaints of pain.    PT Treatment/Interventions ADLs/Self Care Home Management;Cryotherapy;DME Instruction;Electrical Stimulation;Ultrasound;Moist Heat;Gait training;Stair training;Functional mobility training;Therapeutic activities;Therapeutic exercise;Balance training;Neuromuscular re-education;Patient/family education;Orthotic Fit/Training;Manual techniques;Splinting;Energy conservation;Dry  needling;Passive range of motion;Taping;Vasopneumatic Device;Visual/perceptual remediation/compensation    PT Next Visit Plan progress R LE strengthening and balance    Consulted and Agree with Plan of Care Patient             Patient will benefit from skilled therapeutic intervention in order to improve the following deficits and impairments:  Abnormal gait, Decreased coordination, Decreased range of motion, Difficulty walking, Increased fascial restricitons, Increased muscle spasms, Decreased safety awareness, Decreased activity tolerance, Improper body mechanics, Impaired flexibility, Decreased balance, Postural dysfunction, Decreased strength, Decreased mobility  Visit Diagnosis: 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 [place], [place], [address] Phone: [phone]   Fax:  [phone]  Name: Carrie Kline MRN: [id number] Date of Birth: 19-Apr-1946

## 2021-05-05 NOTE — Therapy (Signed)
[place]. [place], [place], [address] Phone: [phone]   Fax:  [phone]  Occupational Therapy Treatment & Progress Note  Patient Details  Name: Carrie Kline MRN: [id number] Date of Birth: [birth year] Referring Provider (OT): Lauraine Rinne, PA-C   Encounter Date: 05/04/2021   OT End of Session - 05/04/21 1105     Visit Number 10    Number of Visits 17    Date for OT Re-Evaluation 06/29/21    Authorization Type BCBS Medicare    Progress Note Due on Visit 10    OT Start Time 1101    OT Stop Time 1145    OT Time Calculation (min) 44 min    Activity Tolerance Patient tolerated treatment well    Behavior During Therapy [facility] for tasks assessed/performed            Occupational Therapy Progress Note  Dates of Reporting Period: 03/31/21 to 05/04/21  Objective Reports of Subjective Statement: Pt reports 0/10 pain and demo's improved AROM, strength, and coordination of RUE, working toward pt-stated goal of "moving my R arm"  Objective Measurements: 9-Hole Peg Test, AROM via goniometer, grip strength via dynamometer, and levels of independence w/ functional activities  Goal Update: Pt has met 2/4 STGs and 2/4 LTGs; continuing to work toward remaining ST and [place]: Continue to provide relevant condition-specific education, particularly regarding positioning and alignment of RUE during ADLs to facilitate safe functional use of UE; update/review HEP as needed; facilitate improved independence and participation in functional activities  Reason Skilled Services are Required: Pt is ~10 weeks post-CVA and will continue to benefit from skilled OT services to address decreased functional use of RUE, strength and coordination, and participation in ADLs to facilitate improved independence   Past Medical History:  Diagnosis Date   Gout    Seasonal allergies     Past Surgical History:  Procedure Laterality Date    ABDOMINAL HYSTERECTOMY     BACK SURGERY     GANGLION CYST EXCISION      There were no vitals filed for this visit.   Subjective Assessment - 05/04/21 1103     Subjective  "I'm being more patient with myself"    Patient is accompanied by: Family member   [name]   Pertinent History Hx of gout and back surgery    Patient Stated Goals "Moving my R arm"    Currently in Pain? No/denies             Treatment/Exercises - 05/04/21    ADLs/IADLs Simulated cutting food using putty and standard utensils; pt Mod I holding food w/ R hand and cutting w/ L and reports using this strategy at home. For increased safety, OT introduced built-up handles to improve grip on fork used to stabilize food w/ R hand while cutting. Pt was able to complete task w/ Mod I 100% of the time, requiring extended time for success  Buttoning and unbuttoning small shirt buttons w/ Mod I in 6/6 trials, requiring extended time and demo'ing good [facility] w/ R hand; pt also completed tying drawstrings and snapping buttons w/ Mod I and no add'l difficulty    Neuro Reeducation OT-facilitated gentle PROM of R shoulder flexion to decrease joint contractures and increase flexibility/ROM w/ add'l potential for increase in motor function. OT able to achieve 106 degrees of flexion w/ no add'l pain or discomfort; mild crepitus palpated w/ pt reporting no discomfort. OT also facilitated contract/release stretching  of R shoulder flexion w/ pt demo'ing good strength/no discomfort and improved ROM w/ repetition         OT Education - 05/04/21 1136     Education Details Adaptive strategies including use of AE for participation in ADLs; built-up handles administered. Also discussed difficulty w/ add'l functional activities at home (transfer out of tub, transition into bed). Reviewed current HEP.    Person(s) Educated Patient    Methods Explanation;Demonstration    Comprehension Verbalized understanding;Returned demonstration              OT Short Term Goals - 05/02/21 1558       OT SHORT TERM GOAL #1   Title Pt will improve independence w/ functional FM tasks (e.g., clothing manipulatives) as evidenced by being able to complete 9HPT w/ R hand    Baseline Completed 4 pegs in >1 min w R hand    Time 4    Period Weeks    Status Achieved   04/20/21 - 1 min, 23 sec   Target Date 04/28/21      OT SHORT TERM GOAL #2   Title Pt will improve AROM of R shoulder flexion by at least 20 degrees to facilitate improved functional reach    Baseline R shoulder flexion 28 degrees    Time 4    Period Weeks    Status Partially Met   05/02/21 - 52 degress of scaption     OT SHORT TERM GOAL #3   Title Pt will improve AROM of R elbow extension by at least 15 degrees to facilitate improved functional reach    Baseline Achieves 52 degrees of flexion when attempting elbow extension    Time 4    Period Weeks    Status Achieved   04/18/21 - 21* of flex w/ elbow ext     OT SHORT TERM GOAL #4   Title Pt will demonstrate R hand grip to within 10# of L hand to improve independence w/ IADLs    Baseline 04/25/21 - RUE 4# (LUE 39#)    Time 4    Period Weeks    Status On-going             OT Long Term Goals - 05/04/21 1123       OT LONG TERM GOAL #1   Title Pt will be able to cut food w/ Mod I, using AE/compensatory strategies prn, 100% of the time    Baseline Frequently requires assist cutting food    Time 8    Period Weeks    Status Achieved      OT LONG TERM GOAL #2   Title Pt will be able to fasten/unfasten clothing manipulatives in 5/5 trials w/ Mod I, using AE prn    Baseline Requires assist w/ fasteners    Time 8    Period Weeks    Status Achieved   05/04/21 -     OT LONG TERM GOAL #3   Title Pt will demonstrate simulated tub/shower transfer safely w/ Mod I    Baseline Min A w/ tub/shower transfer    Time 8    Period Weeks    Status On-going      OT LONG TERM GOAL #4   Title Pt will demonstrate independence w/ HEP  designed for coordination, ROM, strength, and functional use of RUE by d/c    Baseline No HEP at this time    Time 8    Period Weeks    Status  On-going             Plan - 05/04/21 1545     Clinical Impression Statement OT re-assessed progress w/ participation in functional activities w/ pt able to complete simulated cutting of food and manipulation of clothing fasteners w/ Mod I, not requiring AE for clothing fasteners due to observed success w/ [place] in both hands. Due to persistent decreased ROM w/ R shoulder flexion, OT facilitated gentle PROM w/ add'l contract and relax stretching; able to achieve 106 degrees of true flexion w/ pt reporting no add'l pain or discomfort. Considering increased stiffness of shoulder joint, pt will likely benefit from participation in closed-chain BUE exercises in gravity-assisted positioning.    OT Occupational Profile and History Detailed Assessment- Review of Records and additional review of physical, cognitive, psychosocial history related to current functional performance    Occupational performance deficits (Please refer to evaluation for details): ADL's;IADL's;Work;Leisure;Social Participation    Body Structure / Function / Physical Skills ADL;ROM;UE functional use;Mobility;FMC;Decreased knowledge of use of DME;Balance;Body mechanics;Dexterity;Gait;Vision;GMC;Strength;Endurance;Coordination;IADL;Tone    Rehab Potential Good    Clinical Decision Making Several treatment options, min-mod task modification necessary    Comorbidities Affecting Occupational Performance: May have comorbidities impacting occupational performance    Modification or Assistance to Complete Evaluation  Min-Moderate modification of tasks or assist with assess necessary to complete eval    OT Frequency 2x / week    OT Duration 8 weeks    OT Treatment/Interventions Self-care/ADL training;Electrical Stimulation;Therapeutic exercise;Visual/perceptual  remediation/compensation;Patient/family education;Splinting;Neuromuscular education;Moist Heat;Aquatic Therapy;Biofeedback;Energy conservation;Therapist, nutritional;Therapeutic activities;Balance training;Passive range of motion;Manual Therapy;DME and/or AE instruction;Ultrasound;Cryotherapy    Plan Closed-chain shoulder ROM/PROM in supine for functional forward and overhead reach; continue scapular stabilization (wt bearing, body on arm mobilization)    Consulted and Agree with Plan of Care Patient;Family member/caregiver    Family Member Consulted Kim (daughter)            Patient will benefit from skilled therapeutic intervention in order to improve the following deficits and impairments:   Body Structure / Function / Physical Skills: ADL, ROM, UE functional use, Mobility, FMC, Decreased knowledge of use of DME, Balance, Body mechanics, Dexterity, Gait, Vision, GMC, Strength, Endurance, Coordination, IADL, Tone   Visit Diagnosis: 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)  Other lack of coordination  Muscle weakness (generalized)   Problem List Patient Active Problem List   Diagnosis Date Noted   Brainstem infarct, acute ([place]) 03/01/2021   Acute cerebral infarction ([place]) 02/24/2021   Thyroid nodule 02/24/2021   Diabetes ([place]) 02/24/2021     Kathrine Cords, OTR/L, MSOT  05/05/2021, 3:48 PM  [place]. [place], [place], [address] Phone: [phone]   Fax:  [phone]  Name: Carrie Kline MRN: [id number] Date of Birth: Jan 02, 1946

## 2021-05-08 ENCOUNTER — Encounter: Payer: Self-pay | Admitting: Adult Health

## 2021-05-08 ENCOUNTER — Ambulatory Visit: Payer: Medicare Other | Admitting: Adult Health

## 2021-05-08 VITALS — BP 138/80 | HR 86 | Ht 63.0 in | Wt 194.0 lb

## 2021-05-08 DIAGNOSIS — E785 Hyperlipidemia, unspecified: Secondary | ICD-10-CM

## 2021-05-08 DIAGNOSIS — I69351 Hemiplegia and hemiparesis following cerebral infarction affecting right dominant side: Secondary | ICD-10-CM

## 2021-05-08 DIAGNOSIS — E119 Type 2 diabetes mellitus without complications: Secondary | ICD-10-CM

## 2021-05-08 DIAGNOSIS — I639 Cerebral infarction, unspecified: Secondary | ICD-10-CM

## 2021-05-08 NOTE — Patient Instructions (Signed)
Continue working with therapies for likely ongoing recovery   Continue aspirin 81 mg daily  and atorvastatin  for secondary stroke prevention  Continue to follow up with PCP regarding cholesterol, diabetes and blood pressure management  Maintain strict control of hypertension with blood pressure goal below 130/90, diabetes with hemoglobin A1c goal below 7% and cholesterol with LDL cholesterol (bad cholesterol) goal below 70 mg/dL.       Followup in the future with me in 3 months or call earlier if needed       Thank you for coming to see [place] at [facility]. I hope we have been able to provide you high quality care today.  You may receive a patient satisfaction survey over the next few weeks. We would appreciate your feedback and comments so that we may continue to improve ourselves and the health of our patients.

## 2021-05-08 NOTE — Progress Notes (Signed)
[facility] [address] [place]. [place] [address] [phone]       HOSPITAL FOLLOW UP NOTE  [name] Date of Birth:  03/13/1946 Medical Record Number:  [id number]   Reason for Referral:  hospital stroke follow up    SUBJECTIVE:   CHIEF COMPLAINT:  Chief Complaint  Patient presents with   Follow-up    Rm 3 with daughter kim Pt is well, has R sided weakness but PT is helping.     HPI:   [name] is a 75 y.o. female with history of chronic headache admitted on 02/24/2021 for right-sided weakness, slurred speech and right facial droop.  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  EF 55 to 60%.  LDL 121 - started on atorvastatin 40mg  daily.  A1c 7.4 (new dx of DM) -metformin initiated.  Recommended DAPT for 3 weeks and aspirin alone.  No prior stroke history.  Therapy evaluation recommended CIR with residual right hemiparesis, dysarthria and gait impairment.  Today, 05/08/2021, [name] is being seen for hospital follow-up accompanied by her daughter, [name]. Residual right sided weakness with improvement working with therapies.  She does have occasional shoulder stiffness and spasm sensation but denies associated pain.  Currently using RW and AFO brace without any recent falls.  Therapy has been working with her on transitioning to cane.  Denies any residual speech difficulty.  Her daughter has been staying with her to assist as needed.  Denies new stroke/TIA symptoms. On aspirin and atorvastatin. Blood pressure today 138/80- does not monitor at home.  No further concerns at this time.     PERTINENT IMAGING/LABs  CT no acute abnormality. CT head and neck left VA origin in the bilateral distal PCA branches stenosis MRI left pontine infarct 2D Echo EF 55 to 60% LDL 121 HgbA1c 7.4    ROS:   14 system review of systems performed and negative with exception of  those listed in HPI  PMH:  Past Medical History:  Diagnosis Date   Gout    Seasonal allergies     PSH:  Past Surgical History:  Procedure Laterality Date   ABDOMINAL HYSTERECTOMY     BACK SURGERY     GANGLION CYST EXCISION      Social History:  Social History   Socioeconomic History   Marital status: Widowed    Spouse name: Not on file   Number of children: Not on file   Years of education: Not on file   Highest education level: Not on file  Occupational History   Not on file  Tobacco Use   Smoking status: Never   Smokeless tobacco: Never  Vaping Use   Vaping Use: Never used  Substance and Sexual Activity   Alcohol use: No   Drug use: No   Sexual activity: Not on file  Other Topics Concern   Not on file  Social History Narrative   Not on file   Social Determinants of Health   Financial Resource Strain: Not on file  Food Insecurity: Not on file  Transportation Needs: Not on file  Physical Activity: Not on file  Stress: Not on file  Social Connections: Not on file  Intimate Partner Violence: Not on file    Family History:  Family History  Problem Relation Age of Onset   Heart attack Father    Diabetes Sister    Thyroid disease Daughter  Medications:   Current Outpatient Medications on File Prior to Visit  Medication Sig Dispense Refill   aspirin EC 81 MG EC tablet Take 1 tablet (81 mg total) by mouth daily. Swallow whole. 30 tablet 11   atorvastatin (LIPITOR) 40 MG tablet Take 1 tablet (40 mg total) by mouth daily. 90 tablet 1   No current facility-administered medications on file prior to visit.    Allergies:   Allergies  Allergen Reactions   Codeine Itching   Darvon [Propoxyphene]     itching   Hydrocortisone     itching      OBJECTIVE:  Physical Exam  Vitals:   05/08/21 1507  BP: 138/80  Pulse: 86  Weight: 194 lb (88 kg)  Height: 5\' 3"  (1.6 m)   Body mass index is 34.37 kg/m. No results found.  Post stroke PHQ  2/9 Depression screen PHQ 2/9 04/03/2021  Decreased Interest 0  Down, Depressed, Hopeless 0  PHQ - 2 Score 0  Altered sleeping 0  Tired, decreased energy 3  Change in appetite 0  Feeling bad or failure about yourself  0  Trouble concentrating 0  Moving slowly or fidgety/restless 0  Suicidal thoughts 0  PHQ-9 Score 3     General: well developed, well nourished, very pleasant elderly African-American female, seated, in no evident distress Head: head normocephalic and atraumatic.   Neck: supple with no carotid or supraclavicular bruits Cardiovascular: regular rate and rhythm, no murmurs Musculoskeletal: no deformity Skin:  no rash/petichiae Vascular:  Normal pulses all extremities   Neurologic Exam Mental Status: Awake and fully alert.  Fluent speech and language.  Oriented to place and time. Recent and remote memory intact. Attention span, concentration and fund of knowledge appropriate. Mood and affect appropriate.  Cranial Nerves: Fundoscopic exam reveals sharp disc margins. Pupils equal, briskly reactive to light. Extraocular movements full without nystagmus. Visual fields full to confrontation. Hearing intact. Facial sensation intact.  Very slight right lower facial weakness.  Tongue, palate moves normally and symmetrically.  Motor: Normal strength, bulk and tone left upper and lower extremity.  RUE: 4+/5 deltoid, 4/5 bicep and tricep, 3/5 grip strength; limited shoulder ROM RLE: 4-/5 hip flexor, 4/5 knee extension and flexion, 2/5 ADF with AFO brace in place Sensory.: intact to touch , pinprick , position and vibratory sensation.  Coordination: Rapid alternating movements normal in all extremities, left side. Finger-to-nose and heel-to-shin performed accurately on left side. Gait and Station: Arises from chair without difficulty. Stance is normal. Gait demonstrates hemiplegic gait with decreased RLE step height and use of rolling walker. Tandem walk and heel-toe not attempted   Reflexes: 1+ and symmetric. Toes downgoing.     NIHSS  3 Modified Rankin  3      ASSESSMENT: Carrie Kline is a 75 y.o. 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 Vascular risk factors include HLD, advanced age, obesity, migraines and new diagnosis of DM.      PLAN:  Left pontine stroke:  Residual deficit: Right hemiparesis and gait impairment but overall great improvement since initial presentation.  Encouraged continued participation with therapies for hopeful ongoing recovery Continue aspirin 81 mg daily  and atorvastatin 40 mg daily for secondary stroke prevention.   Discussed secondary stroke prevention measures and importance of close PCP follow up for aggressive stroke risk factor management. I have gone over the pathophysiology of stroke, warning signs and symptoms, risk factors  and their management in some detail with instructions to go to the closest emergency room for symptoms of concern. HLD: LDL goal <70. Recent LDL 121.  Continue atorvastatin 40 mg daily. Plan on f/u with PCP in November for repeat labs DMII: A1c goal<7.0. Recent A1c 7.4.  Initially started on metformin but d/c'd prior to discharge in setting of AKI.  Currently on nonpharmacological management.  Plans to repeat A1c in November with PCP.    Follow up in 3 months or call earlier if needed   CC:  [facility] provider: Dr. Pearlean Brownie PCP: Georganna Skeans, MD    I spent 54 minutes of face-to-face and non-face-to-face time with patient and daughter.  This included previsit chart review including review of recent hospitalization, lab review, study review, electronic health record documentation, patient and daughter education and discussion regarding recent stroke including etiology, secondary stroke prevention measures and importance of managing stroke risk factors, residual deficits and typical recovery time and  answered all other questions to patient and daughters satisfaction  Ihor Austin, AGNP-BC  [facility] [address] [place], [place] [address]  Phone [phone] Fax [phone] Note: This document was prepared with digital dictation and possible smart phrase technology. Any transcriptional errors that result from this process are unintentional.

## 2021-05-10 ENCOUNTER — Ambulatory Visit: Payer: Medicare Other | Admitting: Physical Therapy

## 2021-05-10 ENCOUNTER — Telehealth: Payer: Self-pay | Admitting: Occupational Therapy

## 2021-05-10 ENCOUNTER — Other Ambulatory Visit: Payer: Self-pay

## 2021-05-10 ENCOUNTER — Encounter: Payer: Self-pay | Admitting: Occupational Therapy

## 2021-05-10 ENCOUNTER — Encounter: Payer: Self-pay | Admitting: Physical Therapy

## 2021-05-10 ENCOUNTER — Ambulatory Visit: Payer: Medicare Other | Admitting: Occupational Therapy

## 2021-05-10 DIAGNOSIS — R278 Other lack of coordination: Secondary | ICD-10-CM

## 2021-05-10 DIAGNOSIS — R2681 Unsteadiness on feet: Secondary | ICD-10-CM

## 2021-05-10 DIAGNOSIS — I639 Cerebral infarction, unspecified: Secondary | ICD-10-CM

## 2021-05-10 DIAGNOSIS — M6281 Muscle weakness (generalized): Secondary | ICD-10-CM

## 2021-05-10 DIAGNOSIS — I69353 Hemiplegia and hemiparesis 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: Secondary | ICD-10-CM | POA: Diagnosis not present

## 2021-05-10 DIAGNOSIS — R261 Paralytic gait: Secondary | ICD-10-CM

## 2021-05-10 NOTE — Therapy (Signed)
[facility] [address] [place], [place], [address] Phone: [phone]   Fax:  [phone] Progress Note Reporting Period 04/12/21 to 05/10/21  See note below for Objective Data and Assessment of Progress/Goals.     Physical Therapy Treatment  Patient Details  Name: Carrie Kline MRN: [id number] Date of Birth: 13-Mar-1946 Referring Provider (PT): Charlton Amor, PA-C   Encounter Date: 05/10/2021   PT End of Session - 05/10/21 1056     Visit Number 10    Date for PT Re-Evaluation 05/26/21    Authorization Type BCBS    PT Start Time 1010    PT Stop Time 1056    PT Time Calculation (min) 46 min    Activity Tolerance Patient tolerated treatment well;Patient limited by fatigue    Behavior During Therapy [facility] for tasks assessed/performed             Past Medical History:  Diagnosis Date   Gout    Seasonal allergies     Past Surgical History:  Procedure Laterality Date   ABDOMINAL HYSTERECTOMY     BACK SURGERY     GANGLION CYST EXCISION      There were no vitals filed for this visit.   Subjective Assessment - 05/10/21 1011     Subjective Reports right side is really hurting with movements    Currently in Pain? Yes    Pain Score 4     Pain Location Flank    Pain Orientation Right    Pain Descriptors / Indicators Aching;Sore    Aggravating Factors  worse with movements                               [facility] Adult PT Treatment/Exercise - 05/10/21 0001       Ambulation/Gait   Gait Comments stairs up and down, at times she would do step over step and then would do one at a time, she reports not liking the steps, has some fear, gait HHA working on speed and step length      High Level Balance   High Level Balance Activities Side stepping;Backward walking;Direction changes    High Level Balance Comments standing ball toss, standing head turns, 6" toe touches with SPC      Lumbar Exercises:  Aerobic   Nustep L5 x6 min      Lumbar Exercises: Seated   Other Seated Lumbar Exercises trunk rotation small ball hip to hip    Other Seated Lumbar Exercises trunk side to side elbow touches      Knee/Hip Exercises: Machines for Strengthening   Cybex Knee Extension 5# both legs 2x10 with her trying to eep right shin touching the pad    Cybex Knee Flexion 15# 2x10 with both legs, 10# right leg only, only able to do partial ROM      Knee/Hip Exercises: Standing   Hip Abduction 2 sets;10 reps      Knee/Hip Exercises: Seated   Long Arc Quad Right;2 sets;10 reps    Ball Squeeze 2x10    Other Seated Knee/Hip Exercises right ankle DF    Marching Right;2 sets;10 reps                       PT Short Term Goals - 04/14/21 1154       PT SHORT TERM GOAL #1   Title Patient to be independent with initial  HEP.    Status Achieved               PT Long Term Goals - 05/10/21 1059       PT LONG TERM GOAL #1   Title Patient to be independent with advanced HEP.    Status On-going      PT LONG TERM GOAL #2   Title Patient to demonstrate R LE strength >/=4/5.    Status On-going      PT LONG TERM GOAL #3   Title Patient to complete TUG in <14 sec with LRAD in order to decrease risk of falls.    Status On-going                   Plan - 05/10/21 1057     Clinical Impression Statement Patient reports that she had some increased right flank pain, "almost did not come" she had less pain after we did some moving. Working on strength, gait, safety and quality of life.  She does have some fear of the stairs and walking with [facility]    PT Next Visit Plan progress R LE strengthening and balance    Consulted and Agree with Plan of Care Patient             Patient will benefit from skilled therapeutic intervention in order to improve the following deficits and impairments:  Abnormal gait, Decreased coordination, Decreased range of motion, Difficulty walking, Increased  fascial restricitons, Increased muscle spasms, Decreased safety awareness, Decreased activity tolerance, Improper body mechanics, Impaired flexibility, Decreased balance, Postural dysfunction, Decreased strength, Decreased mobility  Visit Diagnosis: 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)  Other lack of coordination  Muscle weakness (generalized)  Unsteadiness on feet  Paralytic gait  Acute cerebral infarction [facility])     Problem List Patient Active Problem List   Diagnosis Date Noted   Brainstem infarct, acute (HCC) 03/01/2021   Acute cerebral infarction (HCC) 02/24/2021   Thyroid nodule 02/24/2021   Diabetes (HCC) 02/24/2021    Jearld Lesch, PT 05/10/2021, 11:00 AM  [facility] [facility] [address] [place], [place], [address] Phone: [phone]   Fax:  [phone]  Name: Carrie Kline MRN: [id number] Date of Birth: 1946/03/03

## 2021-05-10 NOTE — Therapy (Signed)
[place] [place], [place], [address] Phone: [phone]   Fax:  [phone]  Occupational Therapy Treatment  Patient Details  Name: Carrie Kline MRN: [id number] Date of Birth: 12-Oct-1945 Referring Provider (OT): Lauraine Rinne, PA-C  Encounter Date: 05/10/2021   OT End of Session - 05/10/21 1514     Visit Number 11    Number of Visits 17    Date for OT Re-Evaluation 06/29/21    Authorization Type BCBS Medicare    Progress Note Due on Visit 20    OT Start Time 1100    OT Stop Time 1145    OT Time Calculation (min) 45 min    Activity Tolerance Patient tolerated treatment well    Behavior During Therapy [facility] for tasks assessed/performed            Past Medical History:  Diagnosis Date   Gout    Seasonal allergies     Past Surgical History:  Procedure Laterality Date   ABDOMINAL HYSTERECTOMY     BACK SURGERY     GANGLION CYST EXCISION     There were no vitals filed for this visit.   Subjective Assessment - 05/10/21 1106     Subjective  Pt reports she had a hospital follow-up appt w/ neurology on Monday and that she was told "everything looks good"    Patient is accompanied by: Family member   daughter [name])   Pertinent History Hx of gout and back surgery    Patient Stated Goals "Moving my R arm"    Currently in Pain? Other (Comment)   Reports stiffness in trunk on R side w/ movement   Pain Onset 1 to 4 weeks ago             Treatment/Exercises - 05/10/21    Bed Mobility Completed sit to supine transition w/ SPV, requiring increased time for success, and supine to sit transition w/ Min A, requiring UB support to return to sitting    Neuromuscular Reeducation Closed-chain BUE shoulder flex w/ 1# dowel in supine to facilitate scapular alignment w/ retraction and decrease atypical compensatory movement patterns. OT provided facilitation and verbal cues for elbow extension w/ additional support  for controlled ROM of RUE. Pt reported pain in R shoulder and activity was modified.  Single-arm AROM in supine of R shoulder flexion to chest height, abduction w/ support under UE, and external rotation w/ UE adducted and towel roll positioned under upper arm for neutral alignment. Able to complete 10x2 w/ OT providing tactile cues and additional support of RUE prn; no add'l pain.  BUE closed-chain shoulder flexion to approx 50 degrees while sitting EOM w/ 1# dowel rod, focusing on elbow extension and control/coordination. Mild R shoulder elevation w/ verbal cues to decrease compensation.  AROM of bilateral scapular depression and retraction w/ pt seated EOM to continue to facilitate scapular stabilization and strengthening for improved alignment of RUE w/ movement. OT provided tactile cues for increased and consistent muscle activation.    Manual Therapy Gentle scapular mobilization emphasizing depression and retraction w/ pt seated to facilitate positioning and mobility for improved RUE ROM and prevention of pain due to alignment         OT Education - 05/10/21 1514     Education Details Continued condition-specific education    Person(s) Educated Patient    Methods Explanation;Demonstration    Comprehension Verbalized understanding;Returned demonstration  OT Short Term Goals - 05/02/21 1558       OT SHORT TERM GOAL #1   Title Pt will improve independence w/ functional FM tasks (e.g., clothing manipulatives) as evidenced by being able to complete 9HPT w/ R hand    Baseline Completed 4 pegs in >1 min w R hand    Time 4    Period Weeks    Status Achieved   04/20/21 - 1 min, 23 sec   Target Date 04/28/21      OT SHORT TERM GOAL #2   Title Pt will improve AROM of R shoulder flexion by at least 20 degrees to facilitate improved functional reach    Baseline R shoulder flexion 28 degrees    Time 4    Period Weeks    Status Partially Met   05/02/21 - 52 degress of scaption      OT SHORT TERM GOAL #3   Title Pt will improve AROM of R elbow extension by at least 15 degrees to facilitate improved functional reach    Baseline Achieves 52 degrees of flexion when attempting elbow extension    Time 4    Period Weeks    Status Achieved   04/18/21 - 21* of flex w/ elbow ext     OT SHORT TERM GOAL #4   Title Pt will demonstrate R hand grip to within 10# of L hand to improve independence w/ IADLs    Baseline 04/25/21 - RUE 4# (LUE 39#)    Time 4    Period Weeks    Status On-going             OT Long Term Goals - 05/04/21 1123       OT LONG TERM GOAL #1   Title Pt will be able to cut food w/ Mod I, using AE/compensatory strategies prn, 100% of the time    Baseline Frequently requires assist cutting food    Time 8    Period Weeks    Status Achieved      OT LONG TERM GOAL #2   Title Pt will be able to fasten/unfasten clothing manipulatives in 5/5 trials w/ Mod I, using AE prn    Baseline Requires assist w/ fasteners    Time 8    Period Weeks    Status Achieved   05/04/21 -     OT LONG TERM GOAL #3   Title Pt will demonstrate simulated tub/shower transfer safely w/ Mod I    Baseline Min A w/ tub/shower transfer    Time 8    Period Weeks    Status On-going      OT LONG TERM GOAL #4   Title Pt will demonstrate independence w/ HEP designed for coordination, ROM, strength, and functional use of RUE by d/c    Baseline No HEP at this time    Time 8    Period Weeks    Status On-going             Plan - 05/10/21 1516     Clinical Impression Statement Pt demonstrated improved ROM of R shoulder this session and continues to exhibit shoulder scaption when attempting true flexion and decreased R elbow extension. OT facilitated closed-chain exercises in supine this session to increase muscle activation and somatosensory input due to persistent demo of compensatory patterns and previously observed benefit of BUE exercises for RUE ROM. While scapular alignment  has improved, OT continued to facilitate AROM, emphasizing scapular retraction and depression for  improved positioning prior to RUE movement and prevention of shoulder pain.    OT Occupational Profile and History Detailed Assessment- Review of Records and additional review of physical, cognitive, psychosocial history related to current functional performance    Occupational performance deficits (Please refer to evaluation for details): ADL's;IADL's;Work;Leisure;Social Participation    Body Structure / Function / Physical Skills ADL;ROM;UE functional use;Mobility;FMC;Decreased knowledge of use of DME;Balance;Body mechanics;Dexterity;Gait;Vision;GMC;Strength;Endurance;Coordination;IADL;Tone    Rehab Potential Good    Clinical Decision Making Several treatment options, min-mod task modification necessary    Comorbidities Affecting Occupational Performance: May have comorbidities impacting occupational performance    Modification or Assistance to Complete Evaluation  Min-Moderate modification of tasks or assist with assess necessary to complete eval    OT Frequency 2x / week    OT Duration 8 weeks    OT Treatment/Interventions Self-care/ADL training;Electrical Stimulation;Therapeutic exercise;Visual/perceptual remediation/compensation;Patient/family education;Splinting;Neuromuscular education;Moist Heat;Aquatic Therapy;Biofeedback;Energy conservation;Therapist, nutritional;Therapeutic activities;Balance training;Passive range of motion;Manual Therapy;DME and/or AE instruction;Ultrasound;Cryotherapy    Plan Closed-chain shoulder ROM/PROM; estim for elbow/hand extension? 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 [place], [place], [address] Phone: [phone]   Fax:  [phone]  Name: Carrie Kline MRN: [id number] Date of Birth: October 16, 1945

## 2021-05-11 ENCOUNTER — Ambulatory Visit: Payer: Medicare Other | Admitting: Family

## 2021-05-12 ENCOUNTER — Ambulatory Visit: Payer: Medicare Other | Admitting: Physical Therapy

## 2021-05-12 ENCOUNTER — Ambulatory Visit: Payer: Medicare Other | Admitting: Occupational Therapy

## 2021-05-13 ENCOUNTER — Ambulatory Visit: Payer: Medicare Other

## 2021-05-13 ENCOUNTER — Ambulatory Visit (INDEPENDENT_AMBULATORY_CARE_PROVIDER_SITE_OTHER): Payer: Medicare Other

## 2021-05-13 DIAGNOSIS — Z0000 Encounter for general adult medical examination without abnormal findings: Secondary | ICD-10-CM

## 2021-05-13 NOTE — Progress Notes (Signed)
Called to do patient AWV and the family had something come up and needed to reschedule appointment.

## 2021-05-16 ENCOUNTER — Ambulatory Visit: Payer: Medicare Other | Admitting: Physical Therapy

## 2021-05-16 ENCOUNTER — Other Ambulatory Visit: Payer: Self-pay

## 2021-05-16 ENCOUNTER — Ambulatory Visit: Payer: Medicare Other

## 2021-05-16 ENCOUNTER — Ambulatory Visit: Payer: Medicare Other | Admitting: Occupational Therapy

## 2021-05-16 ENCOUNTER — Encounter: Payer: Self-pay | Admitting: Physical Therapy

## 2021-05-16 ENCOUNTER — Encounter: Payer: Self-pay | Admitting: Occupational Therapy

## 2021-05-16 DIAGNOSIS — R278 Other lack of coordination: Secondary | ICD-10-CM

## 2021-05-16 DIAGNOSIS — R2681 Unsteadiness on feet: Secondary | ICD-10-CM

## 2021-05-16 DIAGNOSIS — R261 Paralytic gait: Secondary | ICD-10-CM

## 2021-05-16 DIAGNOSIS — I69353 Hemiplegia and hemiparesis 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: Secondary | ICD-10-CM

## 2021-05-16 DIAGNOSIS — M6281 Muscle weakness (generalized): Secondary | ICD-10-CM

## 2021-05-16 NOTE — Therapy (Signed)
[place]. [place], [place], [address] Phone: [phone]   Fax:  [phone]  Occupational Therapy Treatment  Patient Details  Name: Carrie Kline MRN: [id number] Date of Birth: [birth year] Referring Provider (OT): Lauraine Rinne, PA-C   Encounter Date: 05/16/2021   OT End of Session - 05/16/21 1101     Visit Number 12    Number of Visits 17    Date for OT Re-Evaluation 06/29/21    Authorization Type BCBS Medicare    Progress Note Due on Visit 20    OT Start Time 1058    OT Stop Time 1140    OT Time Calculation (min) 42 min    Activity Tolerance Patient tolerated treatment well    Behavior During Therapy [facility] for tasks assessed/performed            Past Medical History:  Diagnosis Date   Gout    Seasonal allergies     Past Surgical History:  Procedure Laterality Date   ABDOMINAL HYSTERECTOMY     BACK SURGERY     GANGLION CYST EXCISION      There were no vitals filed for this visit.   Subjective Assessment - 05/16/21 1059     Subjective  "I did a lot of new things in PT today;" pt reports she feels like her R side is really starting to "wake up"    Patient is accompanied by: Family member   daughter [name])   Pertinent History Hx of gout and back surgery    Patient Stated Goals "Moving my R arm"    Currently in Pain? No/denies             Treatment/Exercises - 05/16/21    ADLs: LB dressing  Problem-solved adaptive strategies for RLE LB dressing including positioning of LLE to increase ease of cross-leg method when donning R shoe; pt able to demonstrate tying of both shoes w/ Mod I (slightly increased time)    Core Strengthening Abdominal curl w/ resistance; pt sitting EOM on elevated wedge w/ pillows, holding green resistance band anchored by therapist to allow for active up/down grading of exercise, and moving against resistance to facilitate core activation; completed 3x5 w/ rest breaks    Neuromuscular Reeducation    Chest Press BUE closed-chain chest press w/ unweighted dowel into medium therapy ball to facilitate functional forward reach, equal activation, scapular stabilization, and light strengthening w/ additional somatosensory input for increased proprioception and kinesthetic sense of RUE    Elbow Extension AROM of R elbow extension w/ hand EOM; OT providing tactile and verbal cues to activate extensors w/ min activation palpated. Additional modifications provided for carryover to home and verbal cueing to decrease compensatory patterns at shoulder    Shoulder Flexion AROM of single-arm RUE shoulder flexion w/ emphasis on controlled movements; pt able to achieve approx 90 degrees consistently w/out pain         OT Short Term Goals - 05/02/21 1558       OT SHORT TERM GOAL #1   Title Pt will improve independence w/ functional FM tasks (e.g., clothing manipulatives) as evidenced by being able to complete 9HPT w/ R hand    Baseline Completed 4 pegs in >1 min w R hand    Time 4    Period Weeks    Status Achieved   04/20/21 - 1 min, 23 sec   Target Date 04/28/21      OT SHORT TERM GOAL #2  Title Pt will improve AROM of R shoulder flexion by at least 20 degrees to facilitate improved functional reach    Baseline R shoulder flexion 28 degrees    Time 4    Period Weeks    Status Partially Met   05/02/21 - 52 degress of scaption     OT SHORT TERM GOAL #3   Title Pt will improve AROM of R elbow extension by at least 15 degrees to facilitate improved functional reach    Baseline Achieves 52 degrees of flexion when attempting elbow extension    Time 4    Period Weeks    Status Achieved   04/18/21 - 21* of flex w/ elbow ext     OT SHORT TERM GOAL #4   Title Pt will demonstrate R hand grip to within 10# of L hand to improve independence w/ IADLs    Baseline 04/25/21 - RUE 4# (LUE 39#)    Time 4    Period Weeks    Status On-going             OT Long Term Goals -  05/04/21 1123       OT LONG TERM GOAL #1   Title Pt will be able to cut food w/ Mod I, using AE/compensatory strategies prn, 100% of the time    Baseline Frequently requires assist cutting food    Time 8    Period Weeks    Status Achieved      OT LONG TERM GOAL #2   Title Pt will be able to fasten/unfasten clothing manipulatives in 5/5 trials w/ Mod I, using AE prn    Baseline Requires assist w/ fasteners    Time 8    Period Weeks    Status Achieved   05/04/21 -     OT LONG TERM GOAL #3   Title Pt will demonstrate simulated tub/shower transfer safely w/ Mod I    Baseline Min A w/ tub/shower transfer    Time 8    Period Weeks    Status On-going      OT LONG TERM GOAL #4   Title Pt will demonstrate independence w/ HEP designed for coordination, ROM, strength, and functional use of RUE by d/c    Baseline No HEP at this time    Time 8    Period Weeks    Status On-going             Plan - 05/16/21 1102     Clinical Impression Statement Pt continues to progress well toward goals and demonstrates consistent improvement w/ participation in functional BADL tasks. OT advanced neuro reed exercises to incorporate slight resistance to continue to promote proprioceptive input for increased muscle activation, particularly of R shoulder and elbow AROM w/ positive results. Increased contraction palpated w/ elbow extension when able to brace herself w/ contralateral LUE. Pt also continues to demonstrate scpation when attempting shoulder flexion and will benefit from targeted muscle strengthening to facilitate functional over head reach.    OT Occupational Profile and History Detailed Assessment- Review of Records and additional review of physical, cognitive, psychosocial history related to current functional performance    Occupational performance deficits (Please refer to evaluation for details): ADL's;IADL's;Work;Leisure;Social Participation    Body Structure / Function / Physical Skills  ADL;ROM;UE functional use;Mobility;FMC;Decreased knowledge of use of DME;Balance;Body mechanics;Dexterity;Gait;Vision;GMC;Strength;Endurance;Coordination;IADL;Tone    Rehab Potential Good    Clinical Decision Making Several treatment options, min-mod task modification necessary    Comorbidities Affecting Occupational Performance:  May have comorbidities impacting occupational performance    Modification or Assistance to Complete Evaluation  Min-Moderate modification of tasks or assist with assess necessary to complete eval    OT Frequency 2x / week    OT Duration 8 weeks    OT Treatment/Interventions Self-care/ADL training;Electrical Stimulation;Therapeutic exercise;Visual/perceptual remediation/compensation;Patient/family education;Splinting;Neuromuscular education;Moist Heat;Aquatic Therapy;Biofeedback;Energy conservation;Therapist, nutritional;Therapeutic activities;Balance training;Passive range of motion;Manual Therapy;DME and/or AE instruction;Ultrasound;Cryotherapy    Plan Estim for elbow extension; continue to facilitate closed-chain RUE AROM and scapular stabilization (review HEP exercises; BUE wt bearing in prone?); emphasize R shoulder flexion (stretches, positioning during closed-chain AROM)    Consulted and Agree with Plan of Care Patient;Family member/caregiver    Family Member Consulted Kim (daughter)            Patient will benefit from skilled therapeutic intervention in order to improve the following deficits and impairments:   Body Structure / Function / Physical Skills: ADL, ROM, UE functional use, Mobility, FMC, Decreased knowledge of use of DME, Balance, Body mechanics, Dexterity, Gait, Vision, GMC, Strength, Endurance, Coordination, IADL, Tone   Visit Diagnosis: 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 [place], [place], [address] Phone: [phone]   Fax:  [phone]  Name: Brennley Curtice MRN: [id number] Date of Birth: 12-20-1945

## 2021-05-16 NOTE — Therapy (Signed)
[place]. [place], [place], [address] Phone: [phone]   Fax:  [phone]  Physical Therapy Treatment  Patient Details  Name: Carrie Kline MRN: [id number] Date of Birth: 1946/01/18 Referring Provider (PT): Cathlyn Parsons, PA-C   Encounter Date: 05/16/2021   PT End of Session - 05/16/21 1139     Visit Number 11    Number of Visits 17    Date for PT Re-Evaluation 05/26/21    Authorization Type BCBS    PT Start Time 1010    PT Stop Time 1056    PT Time Calculation (min) 46 min    Equipment Utilized During Treatment Gait belt    Activity Tolerance Patient tolerated treatment well    Behavior During Therapy WFL for tasks assessed/performed             Past Medical History:  Diagnosis Date   Gout    Seasonal allergies     Past Surgical History:  Procedure Laterality Date   ABDOMINAL HYSTERECTOMY     BACK SURGERY     GANGLION CYST EXCISION      There were no vitals filed for this visit.   Subjective Assessment - 05/16/21 1014     Subjective Patient with no new complaints, no falls or stumbles    Currently in Pain? No/denies                [facility] PT Assessment - 05/16/21 0001       Timed Up and Go Test   Normal TUG (seconds) 23   with FWW   TUG Comments 20 seconds with the [facility]                           [facility] Adult PT Treatment/Exercise - 05/16/21 0001       Ambulation/Gait   Gait Comments stairs up and down, at times she would do step over step and then would do one at a time, she reports not liking the steps, has some fear, gait HHA working on speed and step length, gait SPC 2x100 feet with supervision, finished up with gait with SPC x 140 feet      High Level Balance   High Level Balance Activities Side stepping;Backward walking;Direction changes    High Level Balance Comments cone toe touches with SPC      Lumbar Exercises: Aerobic   Nustep L5 x6 min       Knee/Hip Exercises: Machines for Strengthening   Cybex Knee Extension 5# both legs 2x10 with her trying to eep right shin touching the pad    Cybex Knee Flexion 20# 2x10 with both legs, 10# right leg only, only able to do partial ROM    Cybex Leg Press 20# 2x10 with both legs, right leg only no weight 3x5                       PT Short Term Goals - 04/14/21 1154       PT SHORT TERM GOAL #1   Title Patient to be independent with initial HEP.    Status Achieved               PT Long Term Goals - 05/16/21 1142       PT LONG TERM GOAL #2   Title Patient to demonstrate R LE strength >/=4/5.    Status On-going  PT LONG TERM GOAL #3   Title Patient to complete TUG in <14 sec with LRAD in order to decrease risk of falls.    Status Partially Met                   Plan - 05/16/21 1140     Clinical Impression Statement Worked a lot on walking with SPC only, had two instances where the toe caught but was able to right self, needed CGA/SBA, had a good pace able to negotiate around obstacles.  I added a lot of different strength ex's, a lot of cues needed for the right leg LAQ on the machine to try to stay in contact with the pad, did well with the leg press could not do with weight on the right leg only    PT Next Visit Plan continue to progress to the [place] and Agree with Plan of Care Patient             Patient will benefit from skilled therapeutic intervention in order to improve the following deficits and impairments:  Abnormal gait, Decreased coordination, Decreased range of motion, Difficulty walking, Increased fascial restricitons, Increased muscle spasms, Decreased safety awareness, Decreased activity tolerance, Improper body mechanics, Impaired flexibility, Decreased balance, Postural dysfunction, Decreased strength, Decreased mobility  Visit Diagnosis: Hemiplegia and hemiparesis following cerebral infarction affecting right  non-dominant side (HCC)  Other lack of coordination  Muscle weakness (generalized)  Unsteadiness on feet  Paralytic gait     Problem List Patient Active Problem List   Diagnosis Date Noted   Brainstem infarct, acute ([place]) 03/01/2021   Acute cerebral infarction ([place]) 02/24/2021   Thyroid nodule 02/24/2021   Diabetes ([place]) 02/24/2021    Sumner Boast, PT 05/16/2021, 11:42 AM  [place]. [place], [place], [address] Phone: [phone]   Fax:  [phone]  Name: Carrie Kline MRN: [id number] Date of Birth: 21-Sep-1945

## 2021-05-18 ENCOUNTER — Other Ambulatory Visit: Payer: Self-pay

## 2021-05-18 ENCOUNTER — Ambulatory Visit: Payer: Medicare Other | Admitting: Physical Therapy

## 2021-05-18 ENCOUNTER — Encounter: Payer: Self-pay | Admitting: Physical Therapy

## 2021-05-18 ENCOUNTER — Ambulatory Visit: Payer: Medicare Other | Admitting: Occupational Therapy

## 2021-05-18 ENCOUNTER — Encounter: Payer: Self-pay | Admitting: Occupational Therapy

## 2021-05-18 DIAGNOSIS — R2681 Unsteadiness on feet: Secondary | ICD-10-CM

## 2021-05-18 DIAGNOSIS — I69353 Hemiplegia and hemiparesis 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: Secondary | ICD-10-CM

## 2021-05-18 DIAGNOSIS — R278 Other lack of coordination: Secondary | ICD-10-CM

## 2021-05-18 DIAGNOSIS — M6281 Muscle weakness (generalized): Secondary | ICD-10-CM

## 2021-05-18 DIAGNOSIS — R261 Paralytic gait: Secondary | ICD-10-CM

## 2021-05-18 NOTE — Therapy (Signed)
[place]. [place], [place], [address] Phone: [phone]   Fax:  [phone]  Physical Therapy Treatment  Patient Details  Name: Carrie Kline MRN: [id number] Date of Birth: March 26, 1946 Referring Provider (PT): Cathlyn Parsons, PA-C   Encounter Date: 05/18/2021   PT End of Session - 05/18/21 1404     Visit Number 12    Number of Visits 17    Date for PT Re-Evaluation 05/26/21    Authorization Type BCBS    PT Start Time 1309    PT Stop Time 1359    PT Time Calculation (min) 50 min    Equipment Utilized During Treatment Gait belt    Activity Tolerance Patient tolerated treatment well;Patient limited by fatigue    Behavior During Therapy WFL for tasks assessed/performed             Past Medical History:  Diagnosis Date   Gout    Seasonal allergies     Past Surgical History:  Procedure Laterality Date   ABDOMINAL HYSTERECTOMY     BACK SURGERY     GANGLION CYST EXCISION      There were no vitals filed for this visit.   Subjective Assessment - 05/18/21 1312     Subjective Doing pretty good. Bringing in her cane with her.    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 No/denies                               [facility] Adult PT Treatment/Exercise - 05/18/21 0001       Ambulation/Gait   Ambulation Distance (Feet) 150 Feet    Assistive device Straight cane    Gait Pattern Step-through pattern;Decreased step length - left;Decreased stance time - right;Decreased dorsiflexion - right;Decreased weight shift to right;Poor foot clearance - right;Trunk flexed;Right genu recurvatum    Ambulation Surface  Outdoor;Paved    Gait velocity decreased    Gait Comments gait training with SPC outside with CGA; cueing to increase hip/knee flexion and improve awareness of obstacles; ascending/descending clinic stairs with [facility]      Neuro Re-ed    Neuro Re-ed Details  R/L sidestep, forward step over obstacles with SPC and CGA x60mn, stepping fwd/back onto foam with SPC and CGA 5x each; 1/2 and full tandem balance 30" each with 1 finger support on II bar      Lumbar Exercises: Aerobic   Nustep L5 x6 min                     PT Education - 05/18/21 1406     Education Details update to HEP; discussion on current HEP program    Person(s) Educated Patient    Methods Explanation;Demonstration;Tactile cues;Verbal cues;Handout    Comprehension Verbalized understanding;Returned demonstration              PT Short Term Goals - 04/14/21 1154       PT SHORT TERM GOAL #1   Title Patient to be independent with initial HEP.    Status Achieved  PT Long Term Goals - 05/16/21 1142       PT LONG TERM GOAL #2   Title Patient to demonstrate R LE strength >/=4/5.    Status On-going      PT LONG TERM GOAL #3   Title Patient to complete TUG in <14 sec with LRAD in order to decrease risk of falls.    Status Partially Met                   Plan - 05/18/21 1404     Clinical Impression Statement Patient without new complaints today. Brought in her personal [facility] for use today and reports using it inside of the home. Worked on multidirectional stepping over obstacles with cueing to increase step length and R knee and hip flexion as well as proper placement of cane for max safety. Balance seemed to improve after practice and cueing. Intermittent sitting rest breaks taken d/t fatigue. Worked on dynamic balance on compliant surface with verbal and tactile cueing for foot placement. Patient initially hesitant to trial gait training with SPC outside today, but able to complete  162f without significant LOB. Cued patient to increase R hip/knee flexion and improve awareness of obstacles. Patient tolerated session well and without complaints at end of session.    PT Treatment/Interventions ADLs/Self Care Home Management;Cryotherapy;DME Instruction;Electrical Stimulation;Ultrasound;Moist Heat;Gait training;Stair training;Functional mobility training;Therapeutic activities;Therapeutic exercise;Balance training;Neuromuscular re-education;Patient/family education;Orthotic Fit/Training;Manual techniques;Splinting;Energy conservation;Dry needling;Passive range of motion;Taping;Vasopneumatic Device;Visual/perceptual remediation/compensation    PT Next Visit [place] Patient             Patient will benefit from skilled therapeutic intervention in order to improve the following deficits and impairments:  Abnormal gait, Decreased coordination, Decreased range of motion, Difficulty walking, Increased fascial restricitons, Increased muscle spasms, Decreased safety awareness, Decreased activity tolerance, Improper body mechanics, Impaired flexibility, Decreased balance, Postural dysfunction, Decreased strength, Decreased mobility  Visit Diagnosis: 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

## 2021-05-19 NOTE — Therapy (Signed)
[place]. [place], [place], [address] Phone: [phone]   Fax:  [phone]  Occupational Therapy Treatment  Patient Details  Name: Carrie Kline MRN: [id number] Date of Birth: [birth year] Referring Provider (OT): Lauraine Rinne, PA-C   Encounter Date: 05/18/2021   OT End of Session - 05/18/21 1406     Visit Number 13    Number of Visits 17    Date for OT Re-Evaluation 06/29/21    Authorization Type BCBS Medicare    Progress Note Due on Visit 31    OT Start Time 1401    OT Stop Time 1443    OT Time Calculation (min) 42 min    Activity Tolerance Patient tolerated treatment well    Behavior During Therapy [facility] for tasks assessed/performed            Past Medical History:  Diagnosis Date   Gout    Seasonal allergies     Past Surgical History:  Procedure Laterality Date   ABDOMINAL HYSTERECTOMY     BACK SURGERY     GANGLION CYST EXCISION      There were no vitals filed for this visit.   Subjective Assessment - 05/18/21 1405     Subjective  Pt reports she would like to continue w/ OP PT and OT 2x/week    Patient is accompanied by: Family member   daughter [name])   Pertinent History Hx of gout and back surgery    Patient Stated Goals "Moving my R arm"    Currently in Pain? No/denies             Treatment/Exercises - 05/18/21    Scapular Stabilization  OT attempted to facilitate scapular stabilization/strengthening exercises in prone position on mat for benefit of wt bearing through bilateral elbows/forearms to increase proprioceptive input and wt bearing for inhibition of tone and equal muscle activation. OT provided verbal cues for problem-solving during bed mobility w/ pt demonstrating improved rolling to L side vs. R and was able to achieve position w/ wt supported on BUEs, but activity was d/c due to pt's discomfort w/ activity. Pt reported no pain.    Neuromuscular Reeducation Wt bearing  on R forearm/elbow to retrieve therapy cones w/ contralateral LUE from floor before completing multi-directional reaching to hand cones to OT; activity facilitated scapular stabilization, proprioceptive input, and body-on-arm movements for increased muscle activation of RUE. Completed 2 sets of 6 cones.  Wt shifting down to R elbow/forearm before engaging extensors to push back to midline, focusing on activation of RUE; pt required Min A for maintaining hand/wrist position on mat during exercise; completed 2 sets of 10 reps         OT Short Term Goals - 05/02/21 1558       OT SHORT TERM GOAL #1   Title Pt will improve independence w/ functional FM tasks (e.g., clothing manipulatives) as evidenced by being able to complete 9HPT w/ R hand    Baseline Completed 4 pegs in >1 min w R hand    Time 4    Period Weeks    Status Achieved   04/20/21 - 1 min, 23 sec   Target Date 04/28/21      OT SHORT TERM GOAL #2   Title Pt will improve AROM of R shoulder flexion by at least 20 degrees to facilitate improved functional reach    Baseline R shoulder flexion 28 degrees    Time 4    Period  Weeks    Status Partially Met   05/02/21 - 52 degress of scaption     OT SHORT TERM GOAL #3   Title Pt will improve AROM of R elbow extension by at least 15 degrees to facilitate improved functional reach    Baseline Achieves 52 degrees of flexion when attempting elbow extension    Time 4    Period Weeks    Status Achieved   04/18/21 - 21* of flex w/ elbow ext     OT SHORT TERM GOAL #4   Title Pt will demonstrate R hand grip to within 10# of L hand to improve independence w/ IADLs    Baseline 04/25/21 - RUE 4# (LUE 39#)    Time 4    Period Weeks    Status On-going             OT Long Term Goals - 05/18/21 1445       OT LONG TERM GOAL #1   Title Pt will be able to cut food w/ Mod I, using AE/compensatory strategies prn, 100% of the time    Baseline Frequently requires assist cutting food    Time 8     Period Weeks    Status Achieved   05/04/21     OT LONG TERM GOAL #2   Title Pt will be able to fasten/unfasten clothing manipulatives in 5/5 trials w/ Mod I, using AE prn    Baseline Requires assist w/ fasteners    Time 8    Period Weeks    Status Achieved   05/04/21     OT LONG TERM GOAL #3   Title Pt will demonstrate simulated tub/shower transfer safely w/ Mod I    Baseline Min A w/ tub/shower transfer    Time 8    Period Weeks    Status On-going      OT LONG TERM GOAL #4   Title Pt will demonstrate independence w/ HEP designed for coordination, ROM, strength, and functional use of RUE by d/c    Baseline No HEP at this time    Time 8    Period Weeks    Status On-going             Plan - 05/18/21 1407     Clinical Impression Statement OT initiated session attempting to transition pt into prone to complete scapular stabilization exercises; pt appeared anxious when repositioning into prone and OT assisted pt w/ returning to sitting EOM. Pt became tearful, reporting she felt very fearful and that her RUE "would not hold her up." OT assured pt she was safe and had been able to achieve appropriate positioning w/ BUE support through her forearms, as well as discussed intention behind therapeutic intervention; task modified to complete wt bearing/shifting while seated upright instead w/ pt able to participate in remainder of session w/out difficulty. OT also reviewed HEP, including wt bearing exercises added this session; pt will benefit from handout of all HEP exercises next session.    OT Occupational Profile and History Detailed Assessment- Review of Records and additional review of physical, cognitive, psychosocial history related to current functional performance    Occupational performance deficits (Please refer to evaluation for details): ADL's;IADL's;Work;Leisure;Social Participation    Body Structure / Function / Physical Skills ADL;ROM;UE functional use;Mobility;FMC;Decreased  knowledge of use of DME;Balance;Body mechanics;Dexterity;Gait;Vision;GMC;Strength;Endurance;Coordination;IADL;Tone    Rehab Potential Good    Clinical Decision Making Several treatment options, min-mod task modification necessary    Comorbidities Affecting Occupational Performance: May  have comorbidities impacting occupational performance    Modification or Assistance to Complete Evaluation  Min-Moderate modification of tasks or assist with assess necessary to complete eval    OT Frequency 2x / week    OT Duration 8 weeks    OT Treatment/Interventions Self-care/ADL training;Electrical Stimulation;Therapeutic exercise;Visual/perceptual remediation/compensation;Patient/family education;Splinting;Neuromuscular education;Moist Heat;Aquatic Therapy;Biofeedback;Energy conservation;Therapist, nutritional;Therapeutic activities;Balance training;Passive range of motion;Manual Therapy;DME and/or AE instruction;Ultrasound;Cryotherapy    Plan Estim for elbow extension; continue to facilitate closed-chain RUE AROM and scapular stabilization; emphasize R shoulder flexion (stretches, positioning during closed-chain AROM)    Consulted and Agree with Plan of Care Patient;Family member/caregiver    Family Member Consulted Kim (daughter)            Patient will benefit from skilled therapeutic intervention in order to improve the following deficits and impairments:   Body Structure / Function / Physical Skills: ADL, ROM, UE functional use, Mobility, FMC, Decreased knowledge of use of DME, Balance, Body mechanics, Dexterity, Gait, Vision, GMC, Strength, Endurance, Coordination, IADL, Tone   Visit Diagnosis: 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)  Other lack of coordination  Muscle weakness (generalized)  Unsteadiness on feet   Problem List Patient Active Problem List   Diagnosis Date Noted   Brainstem infarct, acute ([place]) 03/01/2021    Acute cerebral infarction ([place]) 02/24/2021   Thyroid nodule 02/24/2021   Diabetes ([place]) 02/24/2021    Kathrine Cords, OTR/L, MSOT  05/18/2021, 2:43 PM  [place]. [place], [place], [address] Phone: [phone]   Fax:  [phone]  Name: Carrie Kline MRN: [id number] Date of Birth: 06-30-1946

## 2021-05-23 ENCOUNTER — Encounter: Payer: Self-pay | Admitting: Rehabilitative and Restorative Service Providers"

## 2021-05-23 ENCOUNTER — Other Ambulatory Visit: Payer: Self-pay

## 2021-05-23 ENCOUNTER — Ambulatory Visit: Payer: Medicare Other | Admitting: Rehabilitative and Restorative Service Providers"

## 2021-05-23 ENCOUNTER — Ambulatory Visit: Payer: Medicare Other | Admitting: Occupational Therapy

## 2021-05-23 DIAGNOSIS — R2681 Unsteadiness on feet: Secondary | ICD-10-CM

## 2021-05-23 DIAGNOSIS — M6281 Muscle weakness (generalized): Secondary | ICD-10-CM

## 2021-05-23 DIAGNOSIS — I69353 Hemiplegia and hemiparesis 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: Secondary | ICD-10-CM | POA: Diagnosis not present

## 2021-05-23 DIAGNOSIS — R261 Paralytic gait: Secondary | ICD-10-CM

## 2021-05-23 DIAGNOSIS — R278 Other lack of coordination: Secondary | ICD-10-CM

## 2021-05-23 NOTE — Therapy (Signed)
[place] [place], [place], [address] Phone: [phone]   Fax:  [phone]  Physical Therapy Treatment  Patient Details  Name: Carrie Kline MRN: [id number] Date of Birth: 1946-04-07 Referring Provider (PT): Cathlyn Parsons, PA-C   Encounter Date: 05/23/2021   PT End of Session - 05/23/21 1147     Visit Number 13    Number of Visits 17    Date for PT Re-Evaluation 06/23/21    Authorization Type BCBS    PT Start Time 6503    PT Stop Time 1225    PT Time Calculation (min) 40 min    Equipment Utilized During Treatment Gait belt    Activity Tolerance Patient tolerated treatment well;Patient limited by fatigue    Behavior During Therapy WFL for tasks assessed/performed             Past Medical History:  Diagnosis Date   Gout    Seasonal allergies     Past Surgical History:  Procedure Laterality Date   ABDOMINAL HYSTERECTOMY     BACK SURGERY     GANGLION CYST EXCISION      There were no vitals filed for this visit.   Subjective Assessment - 05/23/21 1146     Subjective Pt reports that she is having an off day and not feeling her best.    Patient Stated Goals be able to walk without AD    Currently in Pain? No/denies                [facility] PT Assessment - 05/23/21 0001       Assessment   Medical Diagnosis Brainstem cerebral infarction    Referring Provider (PT) Cathlyn Parsons, PA-C    Onset Date/Surgical Date 02/24/21    Hand Dominance Left    Prior Therapy IP rehab (7/6-7/26/22)      Precautions   Precautions Fall    Required Braces or Orthoses Other Brace/Splint    Other Brace/Splint RLE AFO      Restrictions   Weight Bearing Restrictions No      Balance Screen   Has the patient fallen in the past 6 months No      [place] residence    Living Arrangements Children    Available Help at Discharge Family    Type of [place] to enter    Entrance Stairs-Number of Steps 1      Prior Function   Level of Independence Independent    Vocation Retired    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  Following ambulation, she states that her body feels looser.      Standardized Balance Assessment   Standardized Balance Assessment Timed Up and Go Test      Berg Balance Test   Sit to Stand Able to stand without using hands and  stabilize independently    Standing Unsupported Able to stand safely 2 minutes    Sitting with Back Unsupported but Feet Supported on Floor or Stool Able to sit safely and securely 2 minutes    Stand to Sit Sits safely with minimal use of hands    Transfers Able to transfer safely, minor use of hands    Standing Unsupported with Eyes Closed Able to stand 10 seconds with supervision    Standing Ubsupported with Feet Together Able to place feet together independently and stand for 1 minute with supervision    From Standing, Reach Forward with Outstretched Arm Can reach forward >5 cm safely (2")    From Standing Position, Pick up Object from Floor Able to pick up shoe, needs supervision    From Standing Position, Turn to Look Behind Over each Shoulder Turn sideways only but maintains balance    Turn 360 Degrees Needs close supervision or verbal cueing    Standing Unsupported, Alternately Place Feet on Step/Stool Able to complete >2 steps/needs minimal assist    Standing Unsupported, One Foot in Front Needs help to step but can hold 15 seconds    Standing on One Leg Unable to try or  needs assist to prevent fall    Total Score 36      Timed Up and Go Test   TUG Normal TUG    Normal TUG (seconds) 18.5   with SPC     Neuro Re-ed    Neuro Re-ed Details  Alt foot tap to 6" step x10 with CGA      Lumbar Exercises: Aerobic   Nustep L5 x6 min      Lumbar Exercises: Standing   Other Standing Lumbar Exercises Side stepping over weight on floor and back x10 with HHA      Knee/Hip Exercises: Seated   Long Arc Quad Strengthening;Both;2 sets;10 reps    Long Arc Quad Weight 5 lbs.    Marching Strengthening;Both;2 sets;10 reps    Marching Limitations 5#                       PT Short Term Goals - 04/14/21 1154       PT SHORT TERM GOAL #1   Title Patient to be independent with initial HEP.    Status Achieved               PT Long Term Goals - 05/23/21 1237       PT LONG TERM GOAL #1   Title Patient to be independent with advanced HEP.    Status On-going      PT LONG TERM GOAL #2   Title Patient to demonstrate R LE strength >/=4/5.    Status On-going      PT LONG TERM GOAL #3   Title Patient to complete TUG in <14 sec with LRAD in order to decrease risk of falls.    Status Partially Met      PT LONG TERM GOAL #4   Title Patient to complete 5xSTS in <15 sec in order to decrease risk of falls.    Status Partially Met      PT LONG TERM GOAL #5   Title Patient to score atleast 47/56 on Berg in order to decrease risk of falls.    Status On-going                   Plan - 05/23/21 1227     Clinical Impression  Statement Carrie Kline is making great progress towards goal related activities, but has not yet met all her functional goals.  Pt reports that she is getting stronger and is able to get out of bath without physical assistance and able to get into bed without assistance.  She has improved on her BERG score from 30/56 to 36/56 and improved on her 5 times sit to/from stand.  She is working towards ambulation safety with use of SPC and  has progressed from using her RW at home to [facility].  She is still using RW outside of home.  With side step over weight on floor and back, pt required hand held assist and cuing for larger steps.  She continues to require skilled PT 2x/week for 4 weeks.    Personal Factors and Comorbidities Comorbidity 2;Age;Time since onset of injury/illness/exacerbation;Past/Current Experience    Comorbidities gout, back surgery    Stability/Clinical Decision Making Evolving/Moderate complexity    Clinical Decision Making Moderate    Rehab Potential Good    PT Frequency 2x / week    PT Duration 4 weeks    PT Treatment/Interventions ADLs/Self Care Home Management;Cryotherapy;DME Instruction;Electrical Stimulation;Ultrasound;Moist Heat;Gait training;Stair training;Functional mobility training;Therapeutic activities;Therapeutic exercise;Balance training;Neuromuscular re-education;Patient/family education;Orthotic Fit/Training;Manual techniques;Splinting;Energy conservation;Dry needling;Passive range of motion;Taping;Vasopneumatic Device;Visual/perceptual remediation/compensation    PT Next Visit Plan Progress strengthening and balance with SPC    Consulted and Agree with Plan of Care Patient             Patient will benefit from skilled therapeutic intervention in order to improve the following deficits and impairments:  Abnormal gait, Decreased coordination, Decreased range of motion, Difficulty walking, Increased fascial restricitons, Increased muscle spasms, Decreased safety awareness, Decreased activity tolerance, Improper body mechanics, Impaired flexibility, Decreased balance, Postural dysfunction, Decreased strength, Decreased mobility  Visit Diagnosis: 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 [place], [place], [address] Phone: [phone]   Fax:  [phone]  Name: Carrie Kline MRN: [id number] Date of Birth: Jan 08, 1946

## 2021-05-24 NOTE — Therapy (Signed)
[place]. [place], [place], [address] Phone: [phone]   Fax:  [phone]  Occupational Therapy Treatment  Patient Details  Name: Carrie Kline MRN: [id number] Date of Birth: 1946-08-12 Referring Provider (OT): Lauraine Rinne, PA-C   Encounter Date: 05/23/2021   OT End of Session - 05/24/21 1500     Visit Number 14    Number of Visits 17    Date for OT Re-Evaluation 06/29/21    Authorization Type BCBS Medicare    Progress Note Due on Visit 20    OT Start Time 1100    OT Stop Time 1145    OT Time Calculation (min) 45 min    Activity Tolerance Patient tolerated treatment well    Behavior During Therapy [facility] for tasks assessed/performed            Past Medical History:  Diagnosis Date   Gout    Seasonal allergies     Past Surgical History:  Procedure Laterality Date   ABDOMINAL HYSTERECTOMY     BACK SURGERY     GANGLION CYST EXCISION      There were no vitals filed for this visit.   Subjective Assessment - 05/23/21 1458     Subjective  Pt reports she felt like she had a bad day yesterday, but that she feels somewhat better today.    Patient is accompanied by: Family member   daughter [name])   Pertinent History Hx of gout and back surgery    Patient Stated Goals "Moving my R arm"    Currently in Pain? No/denies             OT Education - 05/23/21 1512     Education Details OT continued relevant condition-specific education, focusing on addressing psychological and physiological influences on pt's recovery process and participation in daily activities. OT provided support and additional education to increase understanding of common emotional and behavioral impacts post-CVA, local and virtual resources available for pt to reference and utilize, as well as points of concern or consideration regarding when/how to seek pertinent additional help prn.    Person(s) Educated Patient    Methods  Explanation;Handout    Comprehension Verbalized understanding             OT Short Term Goals - 05/02/21 1558       OT SHORT TERM GOAL #1   Title Pt will improve independence w/ functional FM tasks (e.g., clothing manipulatives) as evidenced by being able to complete 9HPT w/ R hand    Baseline Completed 4 pegs in >1 min w R hand    Time 4    Period Weeks    Status Achieved   04/20/21 - 1 min, 23 sec   Target Date 04/28/21      OT SHORT TERM GOAL #2   Title Pt will improve AROM of R shoulder flexion by at least 20 degrees to facilitate improved functional reach    Baseline R shoulder flexion 28 degrees    Time 4    Period Weeks    Status Partially Met   05/02/21 - 52 degress of scaption     OT SHORT TERM GOAL #3   Title Pt will improve AROM of R elbow extension by at least 15 degrees to facilitate improved functional reach    Baseline Achieves 52 degrees of flexion when attempting elbow extension    Time 4    Period Weeks    Status Achieved  04/18/21 - 21* of flex w/ elbow ext     OT SHORT TERM GOAL #4   Title Pt will demonstrate R hand grip to within 10# of L hand to improve independence w/ IADLs    Baseline 04/25/21 - RUE 4# (LUE 39#)    Time 4    Period Weeks    Status On-going             OT Long Term Goals - 05/18/21 1445       OT LONG TERM GOAL #1   Title Pt will be able to cut food w/ Mod I, using AE/compensatory strategies prn, 100% of the time    Baseline Frequently requires assist cutting food    Time 8    Period Weeks    Status Achieved   05/04/21     OT LONG TERM GOAL #2   Title Pt will be able to fasten/unfasten clothing manipulatives in 5/5 trials w/ Mod I, using AE prn    Baseline Requires assist w/ fasteners    Time 8    Period Weeks    Status Achieved   05/04/21     OT LONG TERM GOAL #3   Title Pt will demonstrate simulated tub/shower transfer safely w/ Mod I    Baseline Min A w/ tub/shower transfer    Time 8    Period Weeks    Status  On-going      OT LONG TERM GOAL #4   Title Pt will demonstrate independence w/ HEP designed for coordination, ROM, strength, and functional use of RUE by d/c    Baseline No HEP at this time    Time 8    Period Weeks    Status On-going             Plan - 05/24/21 1502     Clinical Impression Statement Session focused on addressing psychological support and continued provision of condition-specific education due to pt's report that the previous day she felt "off," "down," and "not like herself," which impacted participation in her typical daily activities. Pt reported improved mood today and denied further thoughts/concerns that might indicate appropriateness for more targeted or immediate screening or additional care. OT provided validation and discussed common emotional and behavioral changes post-CVA, as well as provided encouragement, including facilitation of discussion on typical patterns of recovery and pt-specific functional/objective improvements. Pt was receptive and her questions were answered appropriately; OT also encouraged pt to seek further assistance from her physician or a mental health professional if she feels that is appropriate.    OT Occupational Profile and History Detailed Assessment- Review of Records and additional review of physical, cognitive, psychosocial history related to current functional performance    Occupational performance deficits (Please refer to evaluation for details): ADL's;IADL's;Work;Leisure;Social Participation    Body Structure / Function / Physical Skills ADL;ROM;UE functional use;Mobility;FMC;Decreased knowledge of use of DME;Balance;Body mechanics;Dexterity;Gait;Vision;GMC;Strength;Endurance;Coordination;IADL;Tone    Rehab Potential Good    Clinical Decision Making Several treatment options, min-mod task modification necessary    Comorbidities Affecting Occupational Performance: May have comorbidities impacting occupational performance     Modification or Assistance to Complete Evaluation  Min-Moderate modification of tasks or assist with assess necessary to complete eval    OT Frequency 2x / week    OT Duration 8 weeks    OT Treatment/Interventions Self-care/ADL training;Electrical Stimulation;Therapeutic exercise;Visual/perceptual remediation/compensation;Patient/family education;Splinting;Neuromuscular education;Moist Heat;Aquatic Therapy;Biofeedback;Energy conservation;Therapist, nutritional;Therapeutic activities;Balance training;Passive range of motion;Manual Therapy;DME and/or AE instruction;Ultrasound;Cryotherapy    Plan NMES for elbow  extension; continue to facilitate closed-chain RUE AROM and stabilization; emphasize R shoulder flexion (stretches, positioning during closed-chain AROM); grip strengthening    Consulted and Agree with Plan of Care Patient;Family member/caregiver    Family Member Consulted Kim (daughter)            Patient will benefit from skilled therapeutic intervention in order to improve the following deficits and impairments:   Body Structure / Function / Physical Skills: ADL, ROM, UE functional use, Mobility, FMC, Decreased knowledge of use of DME, Balance, Body mechanics, Dexterity, Gait, Vision, GMC, Strength, Endurance, Coordination, IADL, Tone   Visit Diagnosis: 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 [place], [place], [address] Phone: [phone]   Fax:  [phone]  Name: Carrie Kline MRN:  [id number] Date of Birth: August 15, 1946

## 2021-05-25 ENCOUNTER — Ambulatory Visit: Payer: Medicare Other | Admitting: Rehabilitative and Restorative Service Providers"

## 2021-05-25 ENCOUNTER — Ambulatory Visit: Payer: Medicare Other | Admitting: Occupational Therapy

## 2021-05-30 ENCOUNTER — Other Ambulatory Visit: Payer: Self-pay

## 2021-05-30 ENCOUNTER — Encounter: Payer: Self-pay | Admitting: Rehabilitative and Restorative Service Providers"

## 2021-05-30 ENCOUNTER — Ambulatory Visit: Payer: Medicare Other | Admitting: Occupational Therapy

## 2021-05-30 ENCOUNTER — Ambulatory Visit: Payer: Medicare Other | Attending: Physician Assistant | Admitting: Rehabilitative and Restorative Service Providers"

## 2021-05-30 DIAGNOSIS — R261 Paralytic gait: Secondary | ICD-10-CM | POA: Diagnosis present

## 2021-05-30 DIAGNOSIS — R2681 Unsteadiness on feet: Secondary | ICD-10-CM | POA: Diagnosis present

## 2021-05-30 DIAGNOSIS — M6281 Muscle weakness (generalized): Secondary | ICD-10-CM | POA: Diagnosis present

## 2021-05-30 DIAGNOSIS — I639 Cerebral infarction, unspecified: Secondary | ICD-10-CM | POA: Diagnosis present

## 2021-05-30 DIAGNOSIS — I69353 Hemiplegia and hemiparesis 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: Secondary | ICD-10-CM | POA: Insufficient documentation

## 2021-05-30 DIAGNOSIS — R278 Other lack of coordination: Secondary | ICD-10-CM | POA: Insufficient documentation

## 2021-05-30 NOTE — Therapy (Signed)
[place]. [place], [place], [address] Phone: [phone]   Fax:  [phone]  Physical Therapy Treatment  Patient Details  Name: Carrie Kline MRN: [id number] Date of Birth: April 10, 1946 Referring Provider (PT): Cathlyn Parsons, PA-C   Encounter Date: 05/30/2021   PT End of Session - 05/30/21 0951     Visit Number 14    Number of Visits 17    Date for PT Re-Evaluation 06/23/21    Authorization Type BCBS and Medicaid    PT Start Time 0930    PT Stop Time 1010    PT Time Calculation (min) 40 min    Activity Tolerance Patient tolerated treatment well;Patient limited by fatigue    Behavior During Therapy [facility] for tasks assessed/performed             Past Medical History:  Diagnosis Date   Gout    Seasonal allergies     Past Surgical History:  Procedure Laterality Date   ABDOMINAL HYSTERECTOMY     BACK SURGERY     GANGLION CYST EXCISION      There were no vitals filed for this visit.   Subjective Assessment - 05/30/21 0950     Subjective Pt reports that she is doing okay.    Patient Stated Goals be able to walk without AD    Currently in Pain? No/denies                               [facility] Adult PT Treatment/Exercise - 05/30/21 0001       Ambulation/Gait   Gait Comments Due to cold weather, amb inside x200 ft with SPC and SBA with no loss of balance.      Neuro Re-ed    Neuro Re-ed Details  Alt foot tap to 6" step 2x10 with CGA      Lumbar Exercises: Aerobic   Nustep L5 x6 min      Lumbar Exercises: Standing   Other Standing Lumbar Exercises Side stepping over weight on floor and back x10 with HHA      Lumbar Exercises: Seated   Other Seated Lumbar Exercises trunk rotation small ball hip to hip x10    Other Seated Lumbar Exercises trunk side to side elbow touches      Knee/Hip Exercises: Machines for Strengthening   Cybex Leg Press 20# 2x10 with both legs                        PT Short Term Goals - 04/14/21 1154       PT SHORT TERM GOAL #1   Title Patient to be independent with initial HEP.    Status Achieved               PT Long Term Goals - 05/23/21 1237       PT LONG TERM GOAL #1   Title Patient to be independent with advanced HEP.    Status On-going      PT LONG TERM GOAL #2   Title Patient to demonstrate R LE strength >/=4/5.    Status On-going      PT LONG TERM GOAL #3   Title Patient to complete TUG in <14 sec with LRAD in order to decrease risk of falls.    Status Partially Met      PT LONG TERM GOAL #4   Title Patient to  complete 5xSTS in <15 sec in order to decrease risk of falls.    Status Partially Met      PT LONG TERM GOAL #5   Title Patient to score atleast 47/56 on Berg in order to decrease risk of falls.    Status On-going                   Plan - 05/30/21 1021     Clinical Impression Statement Ms Twombly continues to make progress towards goal related activities.  She is progressing with increased tolerance and standing balance.  She continues to require CGA/HHA on standing balance activities, such as side stepping over weight on floor.    PT Treatment/Interventions ADLs/Self Care Home Management;Cryotherapy;DME Instruction;Electrical Stimulation;Ultrasound;Moist Heat;Gait training;Stair training;Functional mobility training;Therapeutic activities;Therapeutic exercise;Balance training;Neuromuscular re-education;Patient/family education;Orthotic Fit/Training;Manual techniques;Splinting;Energy conservation;Dry needling;Passive range of motion;Taping;Vasopneumatic Device;Visual/perceptual remediation/compensation    PT Next Visit Plan Progress strengthening and balance with SPC    Consulted and Agree with Plan of Care Patient             Patient will benefit from skilled therapeutic intervention in order to improve the following deficits and impairments:  Abnormal gait, Decreased  coordination, Decreased range of motion, Difficulty walking, Increased fascial restricitons, Increased muscle spasms, Decreased safety awareness, Decreased activity tolerance, Improper body mechanics, Impaired flexibility, Decreased balance, Postural dysfunction, Decreased strength, Decreased mobility  Visit Diagnosis: 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 [place], [place], [address] Phone: [phone]   Fax:  [phone]  Name: Carrie Kline MRN: [id number] Date of Birth: Oct 11, 1945

## 2021-06-01 ENCOUNTER — Ambulatory Visit: Payer: Medicare Other | Admitting: Occupational Therapy

## 2021-06-01 ENCOUNTER — Ambulatory Visit: Payer: Medicare Other | Admitting: Physical Therapy

## 2021-06-01 ENCOUNTER — Other Ambulatory Visit: Payer: Self-pay

## 2021-06-01 ENCOUNTER — Encounter: Payer: Self-pay | Admitting: Physical Therapy

## 2021-06-01 DIAGNOSIS — I69353 Hemiplegia and hemiparesis 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: Secondary | ICD-10-CM | POA: Diagnosis not present

## 2021-06-01 DIAGNOSIS — M6281 Muscle weakness (generalized): Secondary | ICD-10-CM

## 2021-06-01 DIAGNOSIS — R2681 Unsteadiness on feet: Secondary | ICD-10-CM

## 2021-06-01 DIAGNOSIS — R278 Other lack of coordination: Secondary | ICD-10-CM

## 2021-06-01 DIAGNOSIS — R261 Paralytic gait: Secondary | ICD-10-CM

## 2021-06-01 NOTE — Patient Instructions (Signed)
AROM: Lateral Neck Flexion    Slowly tilt head toward one shoulder, then the other. Hold each position 3-5 seconds. Repeat 5 times per side. Do 1 set per session.   Levator Scapula Stretch, Sitting   Sit, one hand tucked under hip on side to be stretched. Turn head toward other side and look down. Hold 3-5 seconds. Repeat 5 times for each side. **You do not need to add additional stretch with your opposite hand on your head   SELF ASSISTED WITH OBJECT: Shoulder Flexion - Supine    Hold cane with both hands. Raise arms overhead, keep elbows straight. 10-15 reps per set, 2-3 sets per day   SELF ASSISTED WITH OBJECT: Shoulder Flexion / Elbow Extension (Frame)    Keep trunk straight, bend and straighten elbows to move frame backward and forward. 10-15 reps per set, 2-3 sets per day   Scapular Retraction: Elbow Flexion (Standing)    With elbows bent to 90, pinch shoulder blades together and rotate arms out, keeping elbows bent. Repeat 10 times per set. Do 2 sets per session.  Copyright  VHI. All rights reserved.

## 2021-06-01 NOTE — Therapy (Signed)
[facility] [place], [place], [address] Phone: [phone]   Fax:  [phone]  Physical Therapy Treatment  Patient Details  Name: Carrie Kline MRN: [id number] Date of Birth: 12-Dec-1945 Referring Provider (PT): Cathlyn Parsons, PA-C   Encounter Date: 06/01/2021   PT End of Session - 06/01/21 1529     Visit Number 15    Number of Visits 17    Date for PT Re-Evaluation 06/23/21    Authorization Type BCBS and Medicaid    PT Start Time 6195    PT Stop Time 1527    PT Time Calculation (min) 42 min    Equipment Utilized During Treatment Gait belt    Activity Tolerance Patient tolerated treatment well;Patient limited by fatigue    Behavior During Therapy WFL for tasks assessed/performed             Past Medical History:  Diagnosis Date   Gout    Seasonal allergies     Past Surgical History:  Procedure Laterality Date   ABDOMINAL HYSTERECTOMY     BACK SURGERY     GANGLION CYST EXCISION      There were no vitals filed for this visit.   Subjective Assessment - 06/01/21 1447     Subjective Has been doing good. Had some arthritis kick in recently. Continues to use SPC in the house.    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 No/denies                               [facility] Adult PT Treatment/Exercise - 06/01/21 0001       Ambulation/Gait   Ambulation Distance (Feet) 400 Feet    Assistive device Straight cane    Gait Pattern Step-through pattern;Decreased step length - left;Decreased stance time - right;Decreased dorsiflexion - right;Decreased weight shift to right;Poor foot clearance - right;Trunk  flexed;Right genu recurvatum    Ambulation Surface Outdoor    Gait velocity decreased    Gait Comments gait training with SPC with CGA; cueing to increase L step length- good stability and improved endurance      Neuro Re-ed    Neuro Re-ed Details  R step up/back onto foam with 1 HHA 10x; R/L forward/backward step over ankle weight 10x each with 1 HHA; R/L 1/2 tandem 30" each; R/L side stepping over ankle weight 10x with CGA      Lumbar Exercises: Aerobic   Nustep L5 x6 min                     PT Education - 06/01/21 1527     Education Details discussion with patient about her mental health and mood since stroke- patient reports that she was put on medication and has only had 1 "down day." Discussed typical effects of stoke including depression and emotional lability. Encouraged patient to try using RW when outside/in stores to increase walking tolerance rather than using transport chair; encouraged 4WW for longer distances    Person(s) Educated Patient    Methods Explanation;Demonstration;Tactile cues;Verbal cues    Comprehension Verbalized understanding  PT Short Term Goals - 04/14/21 1154       PT SHORT TERM GOAL #1   Title Patient to be independent with initial HEP.    Status Achieved               PT Long Term Goals - 05/23/21 1237       PT LONG TERM GOAL #1   Title Patient to be independent with advanced HEP.    Status On-going      PT LONG TERM GOAL #2   Title Patient to demonstrate R LE strength >/=4/5.    Status On-going      PT LONG TERM GOAL #3   Title Patient to complete TUG in <14 sec with LRAD in order to decrease risk of falls.    Status Partially Met      PT LONG TERM GOAL #4   Title Patient to complete 5xSTS in <15 sec in order to decrease risk of falls.    Status Partially Met      PT LONG TERM GOAL #5   Title Patient to score atleast 47/56 on Berg in order to decrease risk of falls.    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 Reports that she continues to use [facility] in the house. Worked on gait training outside with CGA and cueing to increase L step length. Patient demonstrated good stability and improved endurance throughout. Encouraged use of 4WW for walking outside and for longer distances d/t patient reporting continued use of transport chair at stores. Worked on dynamic stepping activities with addition of obstacles and compliant surfaces for increased challenge. Patient reported fear of falling and quick to fatigue. Encouraged patient to increase step length and height, especially on R LE with good effort to correct. Patient reported all understanding of edu given today and without complaints at end of session. Patient is progressing well towards goals.    Comorbidities gout, back surgery    Examination-Activity Limitations Bathing;Transfers;Locomotion Level;Bed Mobility;Bend;Sit;Caring for Others;Carry;Squat;Stairs;Stand;Toileting;Lift;Hygiene/Grooming;Dressing    PT Treatment/Interventions ADLs/Self Care Home Management;Cryotherapy;DME Instruction;Electrical Stimulation;Ultrasound;Moist Heat;Gait training;Stair training;Functional mobility training;Therapeutic activities;Therapeutic exercise;Balance training;Neuromuscular re-education;Patient/family education;Orthotic Fit/Training;Manual techniques;Splinting;Energy conservation;Dry needling;Passive range of motion;Taping;Vasopneumatic Device;Visual/perceptual remediation/compensation    PT Next Visit Plan Progress strengthening and balance with SPC    Consulted and Agree with Plan of Care Patient             Patient will benefit from skilled therapeutic intervention in order to improve the following deficits and impairments:  Abnormal gait, Decreased coordination,  Decreased range of motion, Difficulty walking, Increased fascial restricitons, Increased muscle spasms, Decreased safety awareness, Decreased activity tolerance, Improper body mechanics, Impaired flexibility, Decreased balance, Postural dysfunction, Decreased strength, Decreased mobility  Visit Diagnosis: 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 [place], [place], [address]  Phone: [phone]   Fax:  [phone]  Name: Kaydon Creedon MRN: [id number] Date of Birth: October 06, 1945

## 2021-06-01 NOTE — Therapy (Signed)
[facility]. [place], [place], [address] Phone: [phone]   Fax:  [phone]  Occupational Therapy Treatment  Patient Details  Name: Carrie Kline MRN: [id number] Date of Birth: 04-Jul-1946 Referring Provider (OT): Lauraine Rinne, PA-C   Encounter Date: 06/01/2021   OT End of Session - 06/01/21 1409     Visit Number 15    Number of Visits 17    Date for OT Re-Evaluation 06/29/21    Authorization Type BCBS Medicare (primary); Medicaid [place] (secondary)    Authorization Time Period VL: [place]    Progress Note Due on Visit 20    OT Start Time 1400    OT Stop Time 1442    OT Time Calculation (min) 42 min    Activity Tolerance Patient tolerated treatment well    Behavior During Therapy WFL for tasks assessed/performed            Past Medical History:  Diagnosis Date   Gout    Seasonal allergies     Past Surgical History:  Procedure Laterality Date   ABDOMINAL HYSTERECTOMY     BACK SURGERY     GANGLION CYST EXCISION      There were no vitals filed for this visit.   Subjective Assessment - 06/01/21 1401     Subjective  Pt reports her neck and L shoulder are feeling a little "stiff;" stating it feels like arthritis discomfort    Patient is accompanied by: Family member   daughter [name])   Pertinent History Hx of gout and back surgery    Patient Stated Goals "Moving my R arm"    Currently in Pain? No/denies             Treatment/Exercises - 06/01/21    Shoulder Flexion: Seated Rolling large therapy ball back and forth while sitting EOM using both UEs to facilitate shoulder flexion in gravity-minimized positioning as well as core activation; completed 3 sets of 5 reps w/ multiple rest breaks required. OT provided additional cues for consistent engagement of RUE during reps    AROM: Forearm/Wrist AROM of forearm supination/pronation and wrist flexion/extension, w/ emphasis on supination and extension,  completed 15x2. OT instructed pt to complete exercises w/ BUEs for visual aid and increased feedback.         OT Education - 06/01/21 1415     Education Details Continued condition-specific education and reviewed HEP w/ handout administered (see pt instructions). OT also followed-up on discussion from previous session.    Person(s) Educated Patient    Methods Explanation;Demonstration;Handout    Comprehension Verbalized understanding;Returned demonstration             OT Short Term Goals - 05/02/21 1558       OT SHORT TERM GOAL #1   Title Pt will improve independence w/ functional FM tasks (e.g., clothing manipulatives) as evidenced by being able to complete 9HPT w/ R hand    Baseline Completed 4 pegs in >1 min w R hand    Time 4    Period Weeks    Status Achieved   04/20/21 - 1 min, 23 sec   Target Date 04/28/21      OT SHORT TERM GOAL #2   Title Pt will improve AROM of R shoulder flexion by at least 20 degrees to facilitate improved functional reach    Baseline R shoulder flexion 28 degrees    Time 4    Period Weeks    Status Partially Met  05/02/21 - 52 degress of scaption     OT SHORT TERM GOAL #3   Title Pt will improve AROM of R elbow extension by at least 15 degrees to facilitate improved functional reach    Baseline Achieves 52 degrees of flexion when attempting elbow extension    Time 4    Period Weeks    Status Achieved   04/18/21 - 21* of flex w/ elbow ext     OT SHORT TERM GOAL #4   Title Pt will demonstrate R hand grip to within 10# of L hand to improve independence w/ IADLs    Baseline 04/25/21 - RUE 4# (LUE 39#)    Time 4    Period Weeks    Status On-going             OT Long Term Goals - 05/18/21 1445       OT LONG TERM GOAL #1   Title Pt will be able to cut food w/ Mod I, using AE/compensatory strategies prn, 100% of the time    Baseline Frequently requires assist cutting food    Time 8    Period Weeks    Status Achieved   05/04/21     OT  LONG TERM GOAL #2   Title Pt will be able to fasten/unfasten clothing manipulatives in 5/5 trials w/ Mod I, using AE prn    Baseline Requires assist w/ fasteners    Time 8    Period Weeks    Status Achieved   05/04/21     OT LONG TERM GOAL #3   Title Pt will demonstrate simulated tub/shower transfer safely w/ Mod I    Baseline Min A w/ tub/shower transfer    Time 8    Period Weeks    Status On-going      OT LONG TERM GOAL #4   Title Pt will demonstrate independence w/ HEP designed for coordination, ROM, strength, and functional use of RUE by d/c    Baseline No HEP at this time    Time 8    Period Weeks    Status On-going             Plan - 06/01/21 1410     Clinical Impression Statement Due to persistent stiffness and decreased ROM of RUE, OT continued to facilitate closed-chain ROM and self-stretching. OT also reviewed current HEP w/ pt and provided a handout of all exercises reviewed. Pt demonstrated decreased forearm supination and wrist extension; considering decreased elbow extension, pt will continue to benefit from increased wt bearing through RUE to increase feedback. During AROM exercises, OT also incorporated bilateral arm training w/ positive results/more consistent arc of motion w/ each repetition.    OT Occupational Profile and History Detailed Assessment- Review of Records and additional review of physical, cognitive, psychosocial history related to current functional performance    Occupational performance deficits (Please refer to evaluation for details): ADL's;IADL's;Work;Leisure;Social Participation    Body Structure / Function / Physical Skills ADL;ROM;UE functional use;Mobility;FMC;Decreased knowledge of use of DME;Balance;Body mechanics;Dexterity;Gait;Vision;GMC;Strength;Endurance;Coordination;IADL;Tone    Rehab Potential Good    Clinical Decision Making Several treatment options, min-mod task modification necessary    Comorbidities Affecting Occupational  Performance: May have comorbidities impacting occupational performance    Modification or Assistance to Complete Evaluation  Min-Moderate modification of tasks or assist with assess necessary to complete eval    OT Frequency 2x / week    OT Duration 8 weeks    OT Treatment/Interventions Self-care/ADL training;Electrical Stimulation;Therapeutic  exercise;Visual/perceptual remediation/compensation;Patient/family education;Splinting;Neuromuscular education;Moist Heat;Aquatic Therapy;Biofeedback;Energy conservation;Therapist, nutritional;Therapeutic activities;Balance training;Passive range of motion;Manual Therapy;DME and/or AE instruction;Ultrasound;Cryotherapy    Plan NMES for elbow extension; continue to facilitate closed-chain RUE AROM and stabilization; emphasize R shoulder flexion (stretches, positioning during closed-chain AROM); grip, forearm,wrist light strengthening (putty, closed-chain AROM)    Consulted and Agree with Plan of Care Patient;Family member/caregiver    Family Member Consulted Kim (daughter)            Patient will benefit from skilled therapeutic intervention in order to improve the following deficits and impairments:   Body Structure / Function / Physical Skills: ADL, ROM, UE functional use, Mobility, FMC, Decreased knowledge of use of DME, Balance, Body mechanics, Dexterity, Gait, Vision, GMC, Strength, Endurance, Coordination, IADL, Tone   Visit Diagnosis: 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 [place], [place], [address] Phone: [phone]   Fax:  [phone]  Name: Carrie Kline MRN: [id number] Date of Birth: 1946-07-24

## 2021-06-06 ENCOUNTER — Ambulatory Visit: Payer: Medicare Other | Admitting: Physical Therapy

## 2021-06-06 ENCOUNTER — Ambulatory Visit: Payer: Medicare Other | Admitting: Occupational Therapy

## 2021-06-06 ENCOUNTER — Encounter: Payer: Self-pay | Admitting: Occupational Therapy

## 2021-06-06 ENCOUNTER — Other Ambulatory Visit: Payer: Self-pay

## 2021-06-06 ENCOUNTER — Ambulatory Visit: Payer: Medicare Other | Admitting: Physical Medicine & Rehabilitation

## 2021-06-06 DIAGNOSIS — R278 Other lack of coordination: Secondary | ICD-10-CM

## 2021-06-06 DIAGNOSIS — R261 Paralytic gait: Secondary | ICD-10-CM

## 2021-06-06 DIAGNOSIS — I69353 Hemiplegia and hemiparesis 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: Secondary | ICD-10-CM | POA: Diagnosis not present

## 2021-06-06 DIAGNOSIS — R2681 Unsteadiness on feet: Secondary | ICD-10-CM

## 2021-06-06 DIAGNOSIS — M6281 Muscle weakness (generalized): Secondary | ICD-10-CM

## 2021-06-06 DIAGNOSIS — I639 Cerebral infarction, unspecified: Secondary | ICD-10-CM

## 2021-06-06 NOTE — Therapy (Signed)
[facility]. [place], [place], [address] Phone: [phone]   Fax:  [phone]  Physical Therapy Treatment  Patient Details  Name: Carrie Kline MRN: [id number] Date of Birth: 1945/09/12 Referring Provider (PT): Cathlyn Parsons, PA-C   Encounter Date: 06/06/2021   PT End of Session - 06/06/21 1435     Visit Number 16    Number of Visits 17    Date for PT Re-Evaluation 06/23/21    Authorization Type BCBS and Medicaid    PT Start Time 1440    PT Stop Time 1525    PT Time Calculation (min) 45 min    Equipment Utilized During Treatment Gait belt    Activity Tolerance Patient tolerated treatment well;Patient limited by fatigue    Behavior During Therapy WFL for tasks assessed/performed             Past Medical History:  Diagnosis Date   Gout    Seasonal allergies     Past Surgical History:  Procedure Laterality Date   ABDOMINAL HYSTERECTOMY     BACK SURGERY     GANGLION CYST EXCISION      There were no vitals filed for this visit.   Subjective Assessment - 06/06/21 1439     Subjective Pt states she just received her rollator on Saturday. Pt states she has been practicing using the rollator.    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 No/denies                               [facility] Adult PT Treatment/Exercise - 06/06/21 0001       Transfers   Five time sit to stand comments  13 sec, 10 sec, 10 sec      Ambulation/Gait   Ambulation Distance (Feet) --   3 laps around front parking lot and 2 loops inside gym   Assistive device Rollator   AFO on R   Gait Pattern Step-through pattern;Decreased  step length - left;Decreased stance time - right;Decreased dorsiflexion - right;Decreased weight shift to right;Poor foot clearance - right;Trunk flexed;Right genu recurvatum    Ambulation Surface Outdoor;Unlevel    Gait velocity decreased    Gait Comments Amb 6 minutes with no rest breaks      Neuro Re-ed    Neuro Re-ed Details  1 foot on 4" step holding stance 2x30 sec      Lumbar Exercises: Aerobic   Nustep L4 x 5 min   end of session cool down     Knee/Hip Exercises: Standing   Hip Abduction 2 sets;10 reps    Abduction Limitations green tband    Hip Extension Stengthening;2 sets;10 reps;Knee straight    Extension Limitations green tband    Other Standing Knee Exercises Weight shift R with L foot tap 2x10 on to airex pad   required tactile cues to maintain R weight shift                    PT Education - 06/06/21 1524     Education Details Discussed increasing her endurance/stamina with a  walking program using her rollator. Discussed updates to her HEP.    Person(s) Educated Patient    Methods Explanation;Demonstration;Tactile cues;Verbal cues    Comprehension Verbalized understanding;Returned demonstration;Verbal cues required;Tactile cues required              PT Short Term Goals - 04/14/21 1154       PT SHORT TERM GOAL #1   Title Patient to be independent with initial HEP.    Status Achieved               PT Long Term Goals - 05/23/21 1237       PT LONG TERM GOAL #1   Title Patient to be independent with advanced HEP.    Status On-going      PT LONG TERM GOAL #2   Title Patient to demonstrate R LE strength >/=4/5.    Status On-going      PT LONG TERM GOAL #3   Title Patient to complete TUG in <14 sec with LRAD in order to decrease risk of falls.    Status Partially Met      PT LONG TERM GOAL #4   Title Patient to complete 5xSTS in <15 sec in order to decrease risk of falls.    Status Partially Met      PT LONG TERM GOAL #5   Title  Patient to score atleast 47/56 on Berg in order to decrease risk of falls.    Status On-going                   Plan - 06/06/21 1524     Clinical Impression Statement Pt fatigues quickly with increased R LE use and weight shifting. Tolerated ambulating outdoors using rollator and able to demo good safety after cueing. Worked on continuing to progress R LE strengthening and balance.    Comorbidities gout, back surgery    Examination-Activity Limitations Bathing;Transfers;Locomotion Level;Bed Mobility;Bend;Sit;Caring for Others;Carry;Squat;Stairs;Stand;Toileting;Lift;Hygiene/Grooming;Dressing    PT Treatment/Interventions ADLs/Self Care Home Management;Cryotherapy;DME Instruction;Electrical Stimulation;Ultrasound;Moist Heat;Gait training;Stair training;Functional mobility training;Therapeutic activities;Therapeutic exercise;Balance training;Neuromuscular re-education;Patient/family education;Orthotic Fit/Training;Manual techniques;Splinting;Energy conservation;Dry needling;Passive range of motion;Taping;Vasopneumatic Device;Visual/perceptual remediation/compensation    PT Next Visit Plan Progress strengthening and balance with SPC    Consulted and Agree with Plan of Care Patient             Patient will benefit from skilled therapeutic intervention in order to improve the following deficits and impairments:  Abnormal gait, Decreased coordination, Decreased range of motion, Difficulty walking, Increased fascial restricitons, Increased muscle spasms, Decreased safety awareness, Decreased activity tolerance, Improper body mechanics, Impaired flexibility, Decreased balance, Postural dysfunction, Decreased strength, Decreased mobility  Visit Diagnosis: 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 [place], [place], [address] Phone: [phone]   Fax:  [phone]  Name: Carrie Kline MRN: [id number] Date of Birth: May 02, 1946

## 2021-06-07 NOTE — Therapy (Signed)
[facility] [address] [place], [place], [address] Phone: [phone]   Fax:  [phone]  Occupational Therapy Treatment  Patient Details  Name: Carrie Kline MRN: [id number] Date of Birth: November 16, 1945 Referring Provider (OT): Carrie Dollar, PA-C   Encounter Date: 06/06/2021   OT End of Session - 06/06/21 1403     Visit Number 16    Number of Visits 17    Date for OT Re-Evaluation 06/29/21    Authorization Type BCBS Medicare (primary); Medicaid [place] (secondary)    Authorization Time Period VL: [place]    Progress Note Due on Visit 20    OT Start Time 1355    OT Stop Time 1440    OT Time Calculation (min) 45 min    Activity Tolerance Patient tolerated treatment well    Behavior During Therapy WFL for tasks assessed/performed            Past Medical History:  Diagnosis Date   Gout    Seasonal allergies     Past Surgical History:  Procedure Laterality Date   ABDOMINAL HYSTERECTOMY     BACK SURGERY     GANGLION CYST EXCISION      There were no vitals filed for this visit.   Subjective Assessment - 06/06/21 1358     Subjective  Pt arrived to session w/ a new rollator and reports she has been using it since she got it on Saturday. Pt also mentioned experiencing a short-lived pain in her L shoulder and then in her R wrist over the weekend that have since resolved.    Patient is accompanied by: Family member   [name]   Pertinent History Hx of gout and back surgery    Patient Stated Goals "Moving my R arm"    Currently in Pain? Yes    Pain Score 6    "Only when I move"   Pain Location Flank    Pain Orientation Right    Pain Descriptors / Indicators Sore;Other (Comment)   "Stiffness"   Pain Onset More than a month ago    Pain Frequency Intermittent    Aggravating Factors  --    Pain Relieving Factors Movement and stretching             Treatment/Exercises - 06/06/21    Towel Slides Towel slides on  elevated tabletop completed while standing w/ assist from LUE to facilitate increased ROM and stretch w/ light wt bearing. Completed 1 set of 12 reps w/ towel only and 1 set of 12 w/ 3# weight included for additional resistance.Pt demonstrated good shoulder ROM/slight compensatory R shoulder elevation during exercise. Activity also addressed endurance and static standing tolerance; required 1 rest break between sets.    Weight Bearing Weight shifting to R side for wt bearing through RUE before activating RUE to push weight back to midline while standing in parallel bars; facilitated inhibition of tone and muscle activation. Pt required Mod A (tactile, verbal) to decrease compensatory pattern of Rt shoulder elevation during exercise. OT also positioned tall mirror as visual aid.    Scapular Stabilization AROM of scapular depression and shoulder rolls, focusing on equal activation, completed 2x10 while standing in parallel bars         OT Education - 06/06/21 1514     Education Details Continued condition-specific education and discussed progress toward goals and POC moving forward. OT also discussed basic rollator safety and demonstrated how to lock and unlock brakes.  Person(s) Educated Patient    Methods Explanation; Demonstration   Comprehension Verbalized understanding; Returned demonstration            OT Short Term Goals - 06/06/21 1416       OT SHORT TERM GOAL #1   Title Pt will improve independence w/ functional FM tasks (e.g., clothing manipulatives) as evidenced by being able to complete 9HPT w/ R hand    Baseline Completed 4 pegs in >1 min w R hand    Time 4    Period Weeks    Status Achieved   04/20/21 - 1 min, 23 sec   Target Date 04/28/21      OT SHORT TERM GOAL #2   Title Pt will improve AROM of R shoulder scaption by at least 20 degrees to facilitate improved functional reach   Revised goal from achieving shoulder flexion to scaption due to focus on function    Baseline R shoulder scaption 28 degrees    Time 4    Period Weeks    Status Achieved   06/06/21 - 96* of scaption w/ difficulty attaining true flexion beyond chest height; 05/02/21 - 52* of scaption     OT SHORT TERM GOAL #3   Title Pt will improve AROM of R elbow extension by at least 15 degrees to facilitate improved functional reach    Baseline Achieves 52 degrees of flexion when attempting elbow extension    Time 4    Period Weeks    Status Achieved   04/18/21 - 21* of flex w/ elbow ext     OT SHORT TERM GOAL #4   Title Pt will demonstrate R hand grip to within 10# of L hand to improve independence w/ IADLs    Baseline 04/25/21 - RUE 4# (LUE 39#)    Time 4    Period Weeks    Status On-going   06/06/21 - 6# w/ RUE            OT Long Term Goals - 05/18/21 1445       OT LONG TERM GOAL #1   Title Pt will be able to cut food w/ Mod I, using AE/compensatory strategies prn, 100% of the time    Baseline Frequently requires assist cutting food    Time 8    Period Weeks    Status Achieved   05/04/21     OT LONG TERM GOAL #2   Title Pt will be able to fasten/unfasten clothing manipulatives in 5/5 trials w/ Mod I, using AE prn    Baseline Requires assist w/ fasteners    Time 8    Period Weeks    Status Achieved   05/04/21     OT LONG TERM GOAL #3   Title Pt will demonstrate simulated tub/shower transfer safely w/ Mod I    Baseline Min A w/ tub/shower transfer    Time 8    Period Weeks    Status On-going      OT LONG TERM GOAL #4   Title Pt will demonstrate independence w/ HEP designed for coordination, ROM, strength, and functional use of RUE by d/c    Baseline No HEP at this time    Time 8    Period Weeks    Status On-going             Plan - 06/06/21 1526     Clinical Impression Statement OT continued to facilitate closed-chain ROM and wt bearing through RUE this session. To  continue to build on bilateral arm training success observed in previous sessions, OT  incorporated towel slides against resistance w/ BUE while also having pt stand for functional balance and reach. Pt was able to achieve shoulder flexion w/ decreased compensatory patterns than when completing other closed-chain exercises, likely due to gravity-assisted nature of exercises. Pt also completed weight bearing through her RUE while standing in parallel bars, focusing on using muscle activation of the arm to return to midline opposed to relying on trunk. Pt did require consistent verbal and tactile cues to decrease compensatory pattern, but was otherwise able to complete activity w/out significant difficulty. Demonstrated safety w/ rollator, ambulating from treatment room to therapy gym w/ Mod I.   OT Occupational Profile and History Detailed Assessment- Review of Records and additional review of physical, cognitive, psychosocial history related to current functional performance    Occupational performance deficits (Please refer to evaluation for details): ADL's;IADL's;Work;Leisure;Social Participation    Body Structure / Function / Physical Skills ADL;ROM;UE functional use;Mobility;FMC;Decreased knowledge of use of DME;Balance;Body mechanics;Dexterity;Gait;Vision;GMC;Strength;Endurance;Coordination;IADL;Tone    Rehab Potential Good    Clinical Decision Making Several treatment options, min-mod task modification necessary    Comorbidities Affecting Occupational Performance: May have comorbidities impacting occupational performance    Modification or Assistance to Complete Evaluation  Min-Moderate modification of tasks or assist with assess necessary to complete eval    OT Frequency 2x / week    OT Duration 8 weeks    OT Treatment/Interventions Self-care/ADL training;Electrical Stimulation;Therapeutic exercise;Visual/perceptual remediation/compensation;Patient/family education;Splinting;Neuromuscular education;Moist Heat;Aquatic Therapy;Biofeedback;Energy conservation;Marine scientist;Therapeutic activities;Balance training;Passive range of motion;Manual Therapy;DME and/or AE instruction;Ultrasound;Cryotherapy    Plan Complete recertification. NMES for elbow extension; continue to facilitate closed-chain RUE AROM and wt bearing; emphasize R shoulder flexion (stretches, positioning during closed-chain AROM); grip, forearm, wrist light strengthening (putty, moving against resistance)    Consulted and Agree with Plan of Care Patient;Family member/caregiver    Family Member Consulted Kim (daughter)            Patient will benefit from skilled therapeutic intervention in order to improve the following deficits and impairments:   Body Structure / Function / Physical Skills: ADL, ROM, UE functional use, Mobility, FMC, Decreased knowledge of use of DME, Balance, Body mechanics, Dexterity, Gait, Vision, GMC, Strength, Endurance, Coordination, IADL, Tone   Visit Diagnosis: 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 [place], [place], [address] Phone: [phone]   Fax:  [phone]  Name: Carrie Kline MRN: [id number] Date of Birth: 11-09-1945

## 2021-06-08 ENCOUNTER — Ambulatory Visit: Payer: Medicare Other | Admitting: Occupational Therapy

## 2021-06-08 ENCOUNTER — Ambulatory Visit: Payer: Medicare Other | Admitting: Physical Therapy

## 2021-06-13 ENCOUNTER — Ambulatory Visit: Payer: Medicare Other | Admitting: Occupational Therapy

## 2021-06-13 ENCOUNTER — Other Ambulatory Visit: Payer: Self-pay

## 2021-06-13 ENCOUNTER — Ambulatory Visit: Payer: Medicare Other | Admitting: Physical Therapy

## 2021-06-13 ENCOUNTER — Encounter: Payer: Self-pay | Admitting: Occupational Therapy

## 2021-06-13 DIAGNOSIS — R278 Other lack of coordination: Secondary | ICD-10-CM

## 2021-06-13 DIAGNOSIS — R2681 Unsteadiness on feet: Secondary | ICD-10-CM

## 2021-06-13 DIAGNOSIS — I69353 Hemiplegia and hemiparesis 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: Secondary | ICD-10-CM | POA: Diagnosis not present

## 2021-06-13 DIAGNOSIS — M6281 Muscle weakness (generalized): Secondary | ICD-10-CM

## 2021-06-13 NOTE — Therapy (Signed)
[place]. [place], [place], [address] Phone: [phone]   Fax:  [phone]  Physical Therapy Treatment  Patient Details  Name: Carrie Kline MRN: [id number] Date of Birth: 11-24-1945 Referring Provider (PT): Cathlyn Parsons, PA-C   Encounter Date: 06/13/2021   PT End of Session - 06/13/21 1521     Visit Number 17    Date for PT Re-Evaluation 06/23/21    Authorization Type BCBS and Medicaid    PT Start Time 1445    PT Stop Time 1525    PT Time Calculation (min) 40 min    Equipment Utilized During Treatment Gait belt    Activity Tolerance Patient tolerated treatment well;Patient limited by fatigue    Behavior During Therapy WFL for tasks assessed/performed             Past Medical History:  Diagnosis Date   Gout    Seasonal allergies     Past Surgical History:  Procedure Laterality Date   ABDOMINAL HYSTERECTOMY     BACK SURGERY     GANGLION CYST EXCISION      There were no vitals filed for this visit.   Subjective Assessment - 06/13/21 1450     Subjective Pt states she was sick last week and had a headache. Feeling better now. Pt states she's been walking with the rollator for ~10 minutes in the drive way.    Patient is accompained by: Family member    Pertinent History gout, back surgery    Limitations Walking;House hold activities;Lifting;Standing    How long can you stand comfortably? ~3 min    How long can you walk comfortably? 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 No/denies                               [facility] Adult PT Treatment/Exercise - 06/13/21 0001       Ambulation/Gait   Gait Comments Forward  stepping over half foam roll 3x2 in // bars; sidestepping over half foam roll 3x2 in // bars      Lumbar Exercises: Aerobic   Nustep L5 x 6 min   LEs only     Knee/Hip Exercises: Stretches   Piriformis Stretch 30 seconds;Right   seated     Knee/Hip Exercises: Standing   Hip Abduction 10 reps;3 sets    Abduction Limitations green tband    Hip Extension Stengthening;10 reps;Knee straight;3 sets    Extension Limitations green tband    Other Standing Knee Exercises Weight shift R with L foot tap 2x10 on to 4" step and airex pad    Other Standing Knee Exercises tandem stance 2x30 sec with right foot back      Knee/Hip Exercises: Seated   Sit to Sand 2 sets;10 reps;without UE support   from chair                    PT Education - 06/13/21 1453     Education Details Discussed when to wear her brace.    Person(s) Educated Patient    Methods Explanation;Demonstration;Tactile cues    Comprehension Verbalized  understanding;Returned demonstration;Verbal cues required;Tactile cues required              PT Short Term Goals - 04/14/21 1154       PT SHORT TERM GOAL #1   Title Patient to be independent with initial HEP.    Status Achieved               PT Long Term Goals - 05/23/21 1237       PT LONG TERM GOAL #1   Title Patient to be independent with advanced HEP.    Status On-going      PT LONG TERM GOAL #2   Title Patient to demonstrate R LE strength >/=4/5.    Status On-going      PT LONG TERM GOAL #3   Title Patient to complete TUG in <14 sec with LRAD in order to decrease risk of falls.    Status Partially Met      PT LONG TERM GOAL #4   Title Patient to complete 5xSTS in <15 sec in order to decrease risk of falls.    Status Partially Met      PT LONG TERM GOAL #5   Title Patient to score atleast 47/56 on [place] in order to decrease risk of falls.    Status On-going                   Plan - 06/13/21 1528     Clinical Impression  Statement Continued to work on R LE stability and weight shifting. Focused on obstacle negotiation this session with stepping over half foam roll. Pt fatigued quickly. Continued to progress strengthening increasing amount of sets.    Personal Factors and Comorbidities Comorbidity 2;Age;Time since onset of injury/illness/exacerbation;Past/Current Experience    Comorbidities gout, back surgery    Examination-Activity Limitations Bathing;Transfers;Locomotion Level;Bed Mobility;Bend;Sit;Caring for Others;Carry;Squat;Stairs;Stand;Toileting;Lift;Hygiene/Grooming;Dressing    PT Treatment/Interventions ADLs/Self Care Home Management;Cryotherapy;DME Instruction;Electrical Stimulation;Ultrasound;Moist Heat;Gait training;Stair training;Functional mobility training;Therapeutic activities;Therapeutic exercise;Balance training;Neuromuscular re-education;Patient/family education;Orthotic Fit/Training;Manual techniques;Splinting;Energy conservation;Dry needling;Passive range of motion;Taping;Vasopneumatic Device;Visual/perceptual remediation/compensation    PT Next Visit Plan Progress strengthening and balance with SPC    PT Home Exercise Plan Pt to walk 12 minutes with rollator each day    Consulted and Agree with Plan of Care Patient             Patient will benefit from skilled therapeutic intervention in order to improve the following deficits and impairments:  Abnormal gait, Decreased coordination, Decreased range of motion, Difficulty walking, Increased fascial restricitons, Increased muscle spasms, Decreased safety awareness, Decreased activity tolerance, Improper body mechanics, Impaired flexibility, Decreased balance, Postural dysfunction, Decreased strength, Decreased mobility  Visit Diagnosis: 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 [place], [place], [address] Phone: [phone]   Fax:  [phone]  Name: Carrie Kline MRN: [id number] Date of Birth: 1945-11-26

## 2021-06-13 NOTE — Therapy (Signed)
[facility] [address] [place], [place], [address] Phone: [phone]   Fax:  [phone]  Occupational Therapy Treatment & Recertification  Patient Details  Name: Carrie Kline MRN: [id number] Date of Birth: Jan 08, 1946 Referring Provider (OT): Mariam Dollar, PA-C   Encounter Date: 06/13/2021   OT End of Session - 06/13/21 1417     Visit Number 17    Number of Visits 25   Initial 17 + 8 additional visits (2x/week)   Date for OT Re-Evaluation 07/13/21    Authorization Type BCBS Medicare (primary); Medicaid [place] (secondary)    Authorization Time Period VL: [place]    Progress Note Due on Visit 20    OT Start Time 1402    OT Stop Time 1446    OT Time Calculation (min) 44 min    Activity Tolerance Patient tolerated treatment well    Behavior During Therapy WFL for tasks assessed/performed            Past Medical History:  Diagnosis Date   Gout    Seasonal allergies     Past Surgical History:  Procedure Laterality Date   ABDOMINAL HYSTERECTOMY     BACK SURGERY     GANGLION CYST EXCISION      There were no vitals filed for this visit.   Subjective Assessment - 06/13/21 1406     Subjective  "I made breakfast this morning." Pt also states she didn't come in last Wednesday because she was not feeling well.    Patient is accompanied by: Family member   daughter [name]   Pertinent History CVA w/ R-sided weakness on 02/24/21; Hx of gout and back surgery    Patient Stated Goals "Moving my R arm"    Currently in Pain? No/denies             Treatment/Exercises - 06/13/21    Weight Bearing Light weight bearing through BUEs while standing at countertop for increased somatosensory/proprioceptive input and facilitation of muscle activation; completed 15x. Pt demonstrated good activation in RUE. OT provided occ verbal/tactile cues to decrease compensatory shoulder elevation during exercise.    Motor Control/Coordination 9-HPT  and Box and Blocks completed to facilitate and assess FM and GM control and coordination. Pt demonstrated difficulty w/ in-hand manipulation and translation, but good grasp and release of small objects during both activities. Occ verbal cues provided to increase awareness of compensatory pattern at shoulder.    Wrist Exerciser Completed AROM and light strengthening of wrist flexion/extension using forearm/wrist dowel rod w/ 2# weight attached; completed 2 sets rolling weight down and reeling back up. Pt demonstrated good wrist extension during exercise and incorporated visual attention to R hand for improved success    Putty Full gross grasp of putty w/ R hand 15x, focusing on muscle activation against resistance opposed to straining to force movement  Thumb-to-finger opposition completed 5x along length of putty w/ index finger; pt demonstrated decreased pad-to-pad prehension w/ OT providing additional cue to isolate index finger/prevent compensating w/ long finger            OT Education - 06/13/21 1416     Education Details Continued condition-specific education; discussed progress and POC    Person(s) Educated Patient    Methods Explanation    Comprehension Verbalized understanding             OT Short Term Goals - 06/13/21 1801       OT SHORT TERM GOAL #1   Title Pt  will improve independence w/ functional FM tasks (e.g., clothing manipulatives) as evidenced by being able to complete 9HPT w/ R hand    Baseline Completed 4 pegs in >1 min w R hand    Time 4    Period Weeks    Status Achieved   04/20/21 - 1 min, 23 sec   Target Date 04/28/21      OT SHORT TERM GOAL #2   Title Pt will improve AROM of R shoulder scaption by at least 20 degrees to facilitate improved functional reach   Revised goal from achieving shoulder flexion to scaption due to focus on function   Baseline R shoulder scaption 28 degrees    Time 4    Period Weeks    Status Achieved   06/06/21 - 96* of  scaption w/ difficulty attaining true flexion beyond chest height; 05/02/21 - 52* of scaption     OT SHORT TERM GOAL #3   Title Pt will improve AROM of R elbow extension by at least 15 degrees to facilitate improved functional reach    Baseline Achieves 52 degrees of flexion when attempting elbow extension    Time 4    Period Weeks    Status Achieved   04/18/21 - 21* of flex w/ elbow ext     OT SHORT TERM GOAL #4   Title Pt will increase R hand grip strength by at least 4 lbs to improve independence w/ IADLs    Baseline 04/25/21 - RUE 4# (LUE 39#)    Time 4    Period Weeks    Status Revised   06/06/21 - 6# w/ RUE            OT Long Term Goals - 06/13/21 1426       OT LONG TERM GOAL #1   Title Pt will be able to cut food w/ Mod I, using AE/compensatory strategies prn, 100% of the time    Baseline Frequently requires assist cutting food    Time 8    Period Weeks    Status Achieved   05/04/21     OT LONG TERM GOAL #2   Title Pt will be able to fasten/unfasten clothing manipulatives in 5/5 trials w/ Mod I, using AE prn    Baseline Requires assist w/ fasteners    Time 8    Period Weeks    Status Achieved   05/04/21     OT LONG TERM GOAL #3   Title Pt will demonstrate simulated tub/shower transfer safely w/ Mod I    Baseline Min A w/ tub/shower transfer    Time 8    Period Weeks    Status Achieved   06/13/21 - per pt report     OT LONG TERM GOAL #4   Title Pt will demonstrate independence w/ HEP designed for coordination, ROM, strength, and functional use of RUE by d/c    Baseline No HEP at this time    Time 8    Period Weeks    Status On-going      OT LONG TERM GOAL #5   Title Pt will be able to safely place/retrieve medium-weight object (approx 5 lbs) at an overhead height w/ BUEs    Baseline Decreased RUE ROM/strength impacting functional reach    Time 4    Period Weeks    Status New    Target Date 07/13/21      OT LONG TERM GOAL #6   Title Pt will demonstrate  improved  RUE GMC as evidenced by increasing Box and Blocks score to at least 52 blocks    Baseline LUE 68; RUE 47    Time 4    Period Weeks    Status New    Target Date 07/13/21             Plan - 06/13/21 1439     Clinical Impression Statement Pt continues to make progress toward goals and is encouraged by the recovery in neuromuscular function she has been noticing, particularly in her RLE. In session today, pt demonstrated good activation w/out compensatory patterns during BUE weight bearing exercise, increasing palpable cocontraction of RUE compared w/ previous sessions. Pt also demonstrated good control and coordination, completing Box and Blocks test slightly less than age-based norm. Pt continues to demonstrate decreased shoulder ROM, RUE/grip strength, and FMC and dexterity. Ms. Leng will continue to benefit from skilled occupational therapy services to further address and improve these limitations and ultimately increase functional use of RUE (dominant side) and participation and independence w/ her daily activities.   OT Occupational Profile and History Detailed Assessment- Review of Records and additional review of physical, cognitive, psychosocial history related to current functional performance    Occupational performance deficits (Please refer to evaluation for details): ADL's;IADL's;Work;Leisure;Social Participation    Body Structure / Function / Physical Skills ADL;ROM;UE functional use;Mobility;FMC;Decreased knowledge of use of DME;Balance;Body mechanics;Dexterity;Gait;Vision;GMC;Strength;Endurance;Coordination;IADL;Tone    Rehab Potential Good    Clinical Decision Making Several treatment options, min-mod task modification necessary    Comorbidities Affecting Occupational Performance: May have comorbidities impacting occupational performance    Modification or Assistance to Complete Evaluation  Min-Moderate modification of tasks or assist with assess necessary to complete  eval    OT Frequency 2x / week    OT Duration 8 weeks    OT Treatment/Interventions Self-care/ADL training;Electrical Stimulation;Therapeutic exercise;Visual/perceptual remediation/compensation;Patient/family education;Splinting;Neuromuscular education;Moist Heat;Aquatic Therapy;Biofeedback;Energy conservation;Building services engineer;Therapeutic activities;Balance training;Passive range of motion;Manual Therapy;DME and/or AE instruction;Ultrasound;Cryotherapy    Plan NMES; continue to facilitate RUE ROM and wt bearing (emphasize shoulder flex, stretches, positioning w/ AROM)   Consulted and Agree with Plan of Care Patient;Family member/caregiver    Family Member Consulted Kim (daughter)            Patient will benefit from skilled therapeutic intervention in order to improve the following deficits and impairments:   Body Structure / Function / Physical Skills: ADL, ROM, UE functional use, Mobility, FMC, Decreased knowledge of use of DME, Balance, Body mechanics, Dexterity, Gait, Vision, GMC, Strength, Endurance, Coordination, IADL, Tone   Visit Diagnosis: 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)  Other lack of coordination  Muscle weakness (generalized)  Unsteadiness on feet   Problem List Patient Active Problem List   Diagnosis Date Noted   Brainstem infarct, acute (HCC) 03/01/2021   Acute cerebral infarction (HCC) 02/24/2021   Thyroid nodule 02/24/2021   Diabetes (HCC) 02/24/2021    Rosie Fate, OTR/L, MSOT 06/13/2021, 6:06 PM  [facility] [address] [place], [place], [address] Phone: [phone]   Fax:  [phone]  Name: Sunday Klos MRN: [id number] Date of Birth: 1946-06-16

## 2021-06-15 ENCOUNTER — Ambulatory Visit: Payer: Medicare Other | Admitting: Physical Therapy

## 2021-06-15 ENCOUNTER — Other Ambulatory Visit: Payer: Self-pay

## 2021-06-15 ENCOUNTER — Encounter: Payer: Medicare Other | Admitting: Physical Therapy

## 2021-06-15 ENCOUNTER — Encounter: Payer: Self-pay | Admitting: Physical Therapy

## 2021-06-15 ENCOUNTER — Ambulatory Visit: Payer: Medicare Other | Admitting: Occupational Therapy

## 2021-06-15 ENCOUNTER — Encounter: Payer: Self-pay | Admitting: Occupational Therapy

## 2021-06-15 DIAGNOSIS — R261 Paralytic gait: Secondary | ICD-10-CM

## 2021-06-15 DIAGNOSIS — I639 Cerebral infarction, unspecified: Secondary | ICD-10-CM

## 2021-06-15 DIAGNOSIS — M6281 Muscle weakness (generalized): Secondary | ICD-10-CM

## 2021-06-15 DIAGNOSIS — R278 Other lack of coordination: Secondary | ICD-10-CM

## 2021-06-15 DIAGNOSIS — I69353 Hemiplegia and hemiparesis 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: Secondary | ICD-10-CM

## 2021-06-15 DIAGNOSIS — R2681 Unsteadiness on feet: Secondary | ICD-10-CM

## 2021-06-15 NOTE — Therapy (Signed)
[place] [place], [place], [address] Phone: [phone]   Fax:  [phone]  Physical Therapy Treatment  Patient Details  Name: Carrie Kline MRN: [id number] Date of Birth: 1945-09-08 Referring Provider (PT): Cathlyn Parsons, PA-C   Encounter Date: 06/15/2021   PT End of Session - 06/15/21 1258     Visit Number 18    Date for PT Re-Evaluation 06/23/21    PT Start Time 1100    PT Stop Time 1146    PT Time Calculation (min) 46 min    Equipment Utilized During Treatment Gait belt    Activity Tolerance Patient tolerated treatment well;Patient limited by fatigue    Behavior During Therapy [facility] for tasks assessed/performed             Past Medical History:  Diagnosis Date   Gout    Seasonal allergies     Past Surgical History:  Procedure Laterality Date   ABDOMINAL HYSTERECTOMY     BACK SURGERY     GANGLION CYST EXCISION      There were no vitals filed for this visit.   Subjective Assessment - 06/15/21 1103     Subjective Reports she wants to continue walking better. No complaints today, she is performing HEP.    How long can you stand comfortably? ~3 min    How long can you walk comfortably? 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 No/denies                               [facility] Adult PT Treatment/Exercise - 06/15/21 0001       Lumbar Exercises: Seated   Other Seated Lumbar Exercises Lean onto R elbos, raise LLE up and onto the mat, then return to sit, 5 x to each side    Other Seated Lumbar Exercises Rotate to R, plaicng BUE on mat, walk hands out away from trunk, engaging a weight shift as far as possible to the R,  then return to sit, 5 x to each side.                     PT Education - 06/15/21 1257     Education Details UPdated HEP, including lean to side, lift opposite leg up and back onto surface.    Person(s) Educated Patient    Methods Explanation;Demonstration;Handout    Comprehension Verbalized understanding;Returned demonstration              PT Short Term Goals - 06/15/21 1302       PT SHORT TERM GOAL #2   Title Patient will be I with updated HEP    Baseline Initiated new exercises.    Time 2    Period Weeks    Status New    Target Date 06/29/21               PT Long Term Goals - 05/23/21 1237       PT LONG TERM GOAL #1   Title Patient to be independent with advanced HEP.    Status On-going      PT LONG  TERM GOAL #2   Title Patient to demonstrate R LE strength >/=4/5.    Status On-going      PT LONG TERM GOAL #3   Title Patient to complete TUG in <14 sec with LRAD in order to decrease risk of falls.    Status Partially Met      PT LONG TERM GOAL #4   Title Patient to complete 5xSTS in <15 sec in order to decrease risk of falls.    Status Partially Met      PT LONG TERM GOAL #5   Title Patient to score atleast 47/56 on [place] in order to decrease risk of falls.    Status On-going                   Plan - 06/15/21 1258     Clinical Impression Statement Patient was initially quiet and appeared nervous. Therapist re-assured her of treatment plan, initiated assessment and treatment to update HEP and progress her in therapy. She demosntrates goo muscular strength in RLE, R ankle demosntratres wea, but activie DF, eversion, and very weak inversion along ieht PF. Assessed coordianted motor control, which did challenge patient in SLS on R.    Personal Factors and Comorbidities Comorbidity 2;Age;Time since onset of injury/illness/exacerbation;Past/Current Experience    Comorbidities gout, back surgery    Examination-Activity Limitations  Bathing;Transfers;Locomotion Level;Bed Mobility;Bend;Sit;Caring for Others;Carry;Squat;Stairs;Stand;Toileting;Lift;Hygiene/Grooming;Dressing    Examination-Participation Restrictions Church;Meal Prep;Cleaning;Community Activity;Shop;Laundry    Stability/Clinical Decision Making Evolving/Moderate complexity    Clinical Decision Making Moderate    Rehab Potential Good    PT Frequency 2x / week    PT Duration Other (comment)   1w   PT Treatment/Interventions ADLs/Self Care Home Management;Cryotherapy;DME Instruction;Electrical Stimulation;Ultrasound;Moist Heat;Gait training;Stair training;Functional mobility training;Therapeutic activities;Therapeutic exercise;Balance training;Neuromuscular re-education;Patient/family education;Orthotic Fit/Training;Manual techniques;Splinting;Energy conservation;Dry needling;Passive range of motion;Taping;Vasopneumatic Device;Visual/perceptual remediation/compensation    PT Next Visit Plan emphasize Coordinated motor control in RLE and in trunk to increaed balance and stability in standing and gait.    Consulted and Agree with Plan of Care Patient             Patient will benefit from skilled therapeutic intervention in order to improve the following deficits and impairments:  Abnormal gait, Decreased coordination, Decreased range of motion, Difficulty walking, Increased fascial restricitons, Increased muscle spasms, Decreased safety awareness, Decreased activity tolerance, Improper body mechanics, Impaired flexibility, Decreased balance, Postural dysfunction, Decreased strength, Decreased mobility  Visit Diagnosis: 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)  Other lack of coordination  Muscle weakness (generalized)  Unsteadiness on feet  Paralytic gait  Acute cerebral infarction [facility])     Problem List Patient Active Problem List   Diagnosis Date Noted   Brainstem infarct, acute ([place])  03/01/2021   Acute cerebral infarction ([place]) 02/24/2021   Thyroid nodule 02/24/2021   Diabetes ([place]) 02/24/2021    Marcelina Morel, DPT 06/15/2021, 1:03 PM  [place]. [place], [place], [address] Phone: [phone]   Fax:  [phone]  Name: Carrie Kline MRN: [id number] Date of Birth: 18-Oct-1945

## 2021-06-15 NOTE — Patient Instructions (Signed)
Lateral Pinch Strengthening (Resistive Putty)    Squeeze between thumb and side of index finger in turn. Repeat 15 times. Do 2-3 sessions per day.  Three Jaw Chuck Pinch Strengthening (Resistive Putty)    Pull using right thumb, index and middle fingers. Repeat 15 times. Do 2-3 sessions per day.  Copyright  VHI. All rights reserved.

## 2021-06-15 NOTE — Patient Instructions (Signed)
Access Code: [email or web address] URL: [email or web address] Date: 06/15/2021 Prepared by: Oley Balm  Exercises Ankle Inversion Eversion Towel Slide - 1 x daily - 7 x weekly - 2 sets - 10 reps Mini Squat with Counter Support - 1 x daily - 7 x weekly - 1 sets - 10 reps Side Stepping with Counter Support - 1 x daily - 7 x weekly - 1 sets - 10 reps

## 2021-06-16 NOTE — Therapy (Signed)
[facility] [address]. [place], [place], [address] Phone: [phone]   Fax:  [phone]  Occupational Therapy Treatment  Patient Details  Name: Carrie Kline MRN: [id number] Date of Birth: 11/29/1945 Referring Provider (OT): Mariam Dollar, PA-C   Encounter Date: 06/15/2021   OT End of Session - 06/15/21 1023     Visit Number 18    Number of Visits 25   Initial 17 + 8 additional visits (2x/week)   Date for OT Re-Evaluation 07/13/21    Authorization Type BCBS Medicare (primary); Medicaid [place] (secondary)    Authorization Time Period VL: [place]    Progress Note Due on Visit 20    OT Start Time 1019    OT Stop Time 1059    OT Time Calculation (min) 40 min    Activity Tolerance Patient tolerated treatment well    Behavior During Therapy WFL for tasks assessed/performed            Past Medical History:  Diagnosis Date   Gout    Seasonal allergies     Past Surgical History:  Procedure Laterality Date   ABDOMINAL HYSTERECTOMY     BACK SURGERY     GANGLION CYST EXCISION     There were no vitals filed for this visit.   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 No/denies             Treatment/Exercises - 06/15/21    Putty Exercises Pt completed 3-point pinch and pull, lateral pinch between thumb and index finger, and individual finger adduction using putty 15x2 each exercise, focusing on attempting movement pattern against resistance opposed to straining to force movement. Pt attempted finger adduction pinch and pull as well as finger extension w/ putty, but due to decreased success,  exercises were d/c    Coordination Activities Pt completed coordination activities w/ each RUE or BUEs as appropriate, including rotating small ball w/ fingertips, tossing/catching ball w/ ipsilateral UE, flipping cards off a deck, rotating cards in-hand, pushing cards off deck using thumb, and picking up various coins to place in a container.            OT Education - 06/15/21 1541     Education Details Introduced coordination activities and updated putty exercises for hand strengthening; corresponding handouts administered    Person(s) Educated Patient    Methods Explanation;Demonstration;Handout    Comprehension Verbalized understanding;Returned demonstration             OT Short Term Goals - 06/15/21 1023       OT SHORT TERM GOAL #1   Title Pt will improve independence w/ functional FM tasks (e.g., clothing manipulatives) as evidenced by being able to complete 9HPT w/ R hand    Baseline Completed 4 pegs in >1 min w R hand    Time 4    Period Weeks    Status Achieved   04/20/21 - 1 min, 23 sec   Target Date 04/28/21      OT SHORT TERM GOAL #2   Title Pt will improve AROM of R shoulder  scaption by at least 20 degrees to facilitate improved functional reach   Revised goal from achieving shoulder flexion to scaption due to focus on function   Baseline R shoulder scaption 28 degrees    Time 4    Period Weeks    Status Achieved   06/06/21 - 96* of scaption w/ difficulty attaining true flexion beyond chest height; 05/02/21 - 52* of scaption     OT SHORT TERM GOAL #3   Title Pt will improve AROM of R elbow extension by at least 15 degrees to facilitate improved functional reach    Baseline Achieves 52 degrees of flexion when attempting elbow extension    Time 4    Period Weeks    Status Achieved   04/18/21 - 21* of flex w/ elbow ext     OT SHORT TERM GOAL #4   Title Pt will increase R hand grip strength by at least 4 lbs to improve independence w/ IADLs    Baseline 04/25/21 -  RUE 4# (LUE 39#)    Time 4    Period Weeks    Status On-going   06/06/21 - 6# w/ RUE            OT Long Term Goals - 06/15/21 1024       OT LONG TERM GOAL #1   Title Pt will be able to cut food w/ Mod I, using AE/compensatory strategies prn, 100% of the time    Baseline Frequently requires assist cutting food    Time 8    Period Weeks    Status Achieved   05/04/21     OT LONG TERM GOAL #2   Title Pt will be able to fasten/unfasten clothing manipulatives in 5/5 trials w/ Mod I, using AE prn    Baseline Requires assist w/ fasteners    Time 8    Period Weeks    Status Achieved   05/04/21     OT LONG TERM GOAL #3   Title Pt will demonstrate simulated tub/shower transfer safely w/ Mod I    Baseline Min A w/ tub/shower transfer    Time 8    Period Weeks    Status Achieved   06/13/21 - per pt report     OT LONG TERM GOAL #4   Title Pt will demonstrate independence w/ HEP designed for coordination, ROM, strength, and functional use of RUE by d/c    Baseline No HEP at this time    Time 8    Period Weeks    Status On-going      OT LONG TERM GOAL #5   Title Pt will be able to safely place/retrieve medium-weight object (approx 5 lbs) at an overhead height w/ BUEs    Baseline Decreased RUE ROM/strength impacting functional reach    Time 4    Period Weeks    Status On-going    Target Date 07/13/21      OT LONG TERM GOAL #6   Title Pt will demonstrate improved RUE GMC as evidenced by increasing Box and Blocks score to at least 52 blocks    Baseline LUE 68; RUE 47    Time 4    Period Weeks    Status On-going    Target Date 07/13/21             Plan - 06/15/21 1024     Clinical Impression Statement Considering pt's improvement in shoulder/scapular stability, OT progressed exercises to emphasize hand strengthening, control, and dexterity.  Pt demonstrated fair FMC during both putty and coordination activities, requiring extended time and occ verbal cues for success. Pt  experienced most success w/ 3-point pinch, ipsilateral toss and catch w/ small ball, and flipping cards off deck vs. rotation, finger adduction, and fine pincer grasp, indicating increased strength of flexors compared to overall intrinsic hand strength.   OT Occupational Profile and History Detailed Assessment- Review of Records and additional review of physical, cognitive, psychosocial history related to current functional performance    Occupational performance deficits (Please refer to evaluation for details): ADL's;IADL's;Work;Leisure;Social Participation    Body Structure / Function / Physical Skills ADL;ROM;UE functional use;Mobility;FMC;Decreased knowledge of use of DME;Balance;Body mechanics;Dexterity;Gait;Vision;GMC;Strength;Endurance;Coordination;IADL;Tone    Rehab Potential Good    Clinical Decision Making Several treatment options, min-mod task modification necessary    Comorbidities Affecting Occupational Performance: May have comorbidities impacting occupational performance    Modification or Assistance to Complete Evaluation  Min-Moderate modification of tasks or assist with assess necessary to complete eval    OT Frequency 2x / week    OT Duration 8 weeks    OT Treatment/Interventions Self-care/ADL training;Electrical Stimulation;Therapeutic exercise;Visual/perceptual remediation/compensation;Patient/family education;Splinting;Neuromuscular education;Moist Heat;Aquatic Therapy;Biofeedback;Energy conservation;Building services engineer;Therapeutic activities;Balance training;Passive range of motion;Manual Therapy;DME and/or AE instruction;Ultrasound;Cryotherapy    Plan NMES; continue to facilitate RUE ROM and wt bearing (emphasize shoulder flex, stretches, positioning w/ AROM)    Consulted and Agree with Plan of Care Patient;Family member/caregiver    Family Member Consulted Kim (daughter)            Patient will benefit from skilled therapeutic intervention in order to improve  the following deficits and impairments:   Body Structure / Function / Physical Skills: ADL, ROM, UE functional use, Mobility, FMC, Decreased knowledge of use of DME, Balance, Body mechanics, Dexterity, Gait, Vision, GMC, Strength, Endurance, Coordination, IADL, Tone   Visit Diagnosis: 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. [place], [place], [address] Phone: [phone]   Fax:  [phone]  Name: Carrie Kline MRN: [id number] Date of Birth: Dec 25, 1945

## 2021-06-19 ENCOUNTER — Encounter (HOSPITAL_BASED_OUTPATIENT_CLINIC_OR_DEPARTMENT_OTHER): Payer: Self-pay

## 2021-06-19 ENCOUNTER — Emergency Department (HOSPITAL_BASED_OUTPATIENT_CLINIC_OR_DEPARTMENT_OTHER)
Admission: EM | Admit: 2021-06-19 | Discharge: 2021-06-19 | Disposition: A | Discharge status: Home / Self Care | Payer: Medicare Other | Attending: Emergency Medicine | Admitting: Emergency Medicine

## 2021-06-19 ENCOUNTER — Emergency Department (HOSPITAL_BASED_OUTPATIENT_CLINIC_OR_DEPARTMENT_OTHER): Payer: Medicare Other

## 2021-06-19 ENCOUNTER — Other Ambulatory Visit: Payer: Self-pay

## 2021-06-19 DIAGNOSIS — E119 Type 2 diabetes mellitus without complications: Secondary | ICD-10-CM | POA: Diagnosis not present

## 2021-06-19 DIAGNOSIS — R29810 Facial weakness: Secondary | ICD-10-CM | POA: Diagnosis not present

## 2021-06-19 DIAGNOSIS — Z8673 Personal history of transient ischemic attack (TIA), and cerebral infarction without residual deficits: Secondary | ICD-10-CM | POA: Insufficient documentation

## 2021-06-19 HISTORY — DX: Cerebral infarction, unspecified: I63.9

## 2021-06-19 LAB — URINALYSIS, ROUTINE W REFLEX MICROSCOPIC
Bilirubin Urine: NEGATIVE
Glucose, UA: NEGATIVE mg/dL
Hgb urine dipstick: NEGATIVE
Ketones, ur: NEGATIVE mg/dL
Leukocytes,Ua: NEGATIVE
Nitrite: NEGATIVE
Protein, ur: NEGATIVE mg/dL
Specific Gravity, Urine: 1.015 (ref 1.005–1.030)
pH: 5.5 (ref 5.0–8.0)

## 2021-06-19 LAB — COMPREHENSIVE METABOLIC PANEL
ALT: 18 U/L (ref 0–44)
AST: 17 U/L (ref 15–41)
Albumin: 3.8 g/dL (ref 3.5–5.0)
Alkaline Phosphatase: 84 U/L (ref 38–126)
Anion gap: 7 (ref 5–15)
BUN: 21 mg/dL (ref 8–23)
CO2: 26 mmol/L (ref 22–32)
Calcium: 9.8 mg/dL (ref 8.9–10.3)
Chloride: 105 mmol/L (ref 98–111)
Creatinine, Ser: 0.79 mg/dL (ref 0.44–1.00)
GFR, Estimated: >60 mL/min (ref >60)
Glucose, Bld: 119 mg/dL — ABNORMAL HIGH (ref 70–99)
Potassium: 3.8 mmol/L (ref 3.5–5.1)
Sodium: 138 mmol/L (ref 135–145)
Total Bilirubin: 0.3 mg/dL (ref 0.3–1.2)
Total Protein: 8.1 g/dL (ref 6.5–8.1)

## 2021-06-19 LAB — CBC
HCT: 38 % (ref 36.0–46.0)
Hemoglobin: 12.5 g/dL (ref 12.0–15.0)
MCH: 29.1 pg (ref 26.0–34.0)
MCHC: 32.9 g/dL (ref 30.0–36.0)
MCV: 88.6 fL (ref 80.0–100.0)
Platelets: 179 10*3/uL (ref 150–400)
RBC: 4.29 MIL/uL (ref 3.87–5.11)
RDW: 13.1 % (ref 11.5–15.5)
WBC: 6 10*3/uL (ref 4.0–10.5)
nRBC: 0 % (ref 0.0–0.2)

## 2021-06-19 LAB — DIFFERENTIAL
Abs Immature Granulocytes: 0.01 10*3/uL (ref 0.00–0.07)
Basophils Absolute: 0 10*3/uL (ref 0.0–0.1)
Basophils Relative: 0 %
Eosinophils Absolute: 0.1 10*3/uL (ref 0.0–0.5)
Eosinophils Relative: 2 %
Immature Granulocytes: 0 %
Lymphocytes Relative: 31 %
Lymphs Abs: 1.9 10*3/uL (ref 0.7–4.0)
Monocytes Absolute: 0.3 10*3/uL (ref 0.1–1.0)
Monocytes Relative: 5 %
Neutro Abs: 3.7 10*3/uL (ref 1.7–7.7)
Neutrophils Relative %: 62 %

## 2021-06-19 LAB — PROTIME-INR
INR: 1.1 (ref 0.8–1.2)
Prothrombin Time: 14 seconds (ref 11.4–15.2)

## 2021-06-19 MED ORDER — IOHEXOL 350 MG/ML SOLN
100.0000 mL | Freq: Once | INTRAVENOUS | Status: AC | PRN
  Administered 2021-06-19: 100 mL via INTRAVENOUS

## 2021-06-19 NOTE — ED Notes (Signed)
Pt feels that her facial droop has resolved, passed swallow screen and gave pt and visitor drink and snack.

## 2021-06-19 NOTE — Discharge Instructions (Signed)
1.  Follow-up with your neurologist as soon as possible for recheck. 2.  Return to emergency department immediately if you have recurrence of symptoms or new or worsening symptoms of stroke.  Reviewed the instructions for transient ischemic attack for return precautions.

## 2021-06-19 NOTE — ED Notes (Signed)
Checked about IV, no good vein felt.  Charge RN to place IV with [place].

## 2021-06-19 NOTE — ED Triage Notes (Signed)
Pt c/o left side facial droop that she noticed in BR mirror ~1030am-states she got up to BR ~330am and did not see facial droop-also c/o HA started ~1030am-NAD-to triage in w/c

## 2021-06-19 NOTE — ED Notes (Signed)
Pt ambulated to bathroom with assistance from walker

## 2021-06-19 NOTE — ED Notes (Signed)
EDP [facility]

## 2021-06-19 NOTE — ED Provider Notes (Signed)
[facility] EMERGENCY DEPARTMENT Provider Note   CSN: [id number] Arrival date & time: 06/19/21  1141     History Chief Complaint  Patient presents with   Facial Droop    Issabella Kline is a 75 y.o. female.  Presenting to the emergency room with concern for facial droop.  Patient reports that she got up to go to the bathroom around 330 and did not note any sort of facial droop at the time.  She got up out of bed around 10 or 1030 this morning and that is when she noticed the left side of her face seemed to be drooping more.  She has history of stroke in July 2022.  Has some residual right-sided deficits though she has made some progress with rehab.  Still requires cane or walker for mobility.  No change in her right-sided weakness.  She denies any numbness or weakness on the left side.  No speech or vision change.  No rashes.  HPI     Past Medical History:  Diagnosis Date   Gout    Seasonal allergies    Stroke [facility])     Patient Active Problem List   Diagnosis Date Noted   Brainstem infarct, acute (HCC) 03/01/2021   Acute cerebral infarction (HCC) 02/24/2021   Thyroid nodule 02/24/2021   Diabetes (HCC) 02/24/2021    Past Surgical History:  Procedure Laterality Date   ABDOMINAL HYSTERECTOMY     BACK SURGERY     GANGLION CYST EXCISION       OB History   No obstetric history on file.     Family History  Problem Relation Age of Onset   Heart attack Father    Diabetes Sister    Thyroid disease Daughter     Social History   Tobacco Use   Smoking status: Never   Smokeless tobacco: Never  Vaping Use   Vaping Use: Never used  Substance Use Topics   Alcohol use: No   Drug use: No    Home Medications Prior to Admission medications   Medication Sig Start Date End Date Taking? Authorizing Provider  aspirin EC 81 MG EC tablet Take 1 tablet (81 mg total) by mouth daily. Swallow whole. 03/01/21   Demaio, Alexa, MD  atorvastatin (LIPITOR) 40 MG tablet Take 1  tablet (40 mg total) by mouth daily. 04/04/21 05/08/21  Georganna Skeans, MD    Allergies    Codeine, Darvon [propoxyphene], and Hydrocortisone  Review of Systems   Review of Systems  Constitutional:  Negative for chills and fever.  HENT:  Negative for ear pain and sore throat.   Eyes:  Negative for pain and visual disturbance.  Respiratory:  Negative for cough and shortness of breath.   Cardiovascular:  Negative for chest pain and palpitations.  Gastrointestinal:  Negative for abdominal pain and vomiting.  Genitourinary:  Negative for dysuria and hematuria.  Musculoskeletal:  Negative for arthralgias and back pain.  Skin:  Negative for color change and rash.  Neurological:  Positive for weakness. Negative for seizures and syncope.  All other systems reviewed and are negative.  Physical Exam Updated Vital Signs BP (!) 145/95   Pulse 100   Temp 98.6 F (37 C) (Oral)   Resp (!) 22   SpO2 100%   Physical Exam Vitals and nursing note reviewed.  Constitutional:      General: She is not in acute distress.    Appearance: She is well-developed.  HENT:     Head: Normocephalic  and atraumatic.  Eyes:     Conjunctiva/sclera: Conjunctivae normal.  Cardiovascular:     Rate and Rhythm: Normal rate and regular rhythm.     Heart sounds: No murmur heard. Pulmonary:     Effort: Pulmonary effort is normal. No respiratory distress.     Breath sounds: Normal breath sounds.  Abdominal:     Palpations: Abdomen is soft.     Tenderness: There is no abdominal tenderness.  Musculoskeletal:     Cervical back: Neck supple.  Skin:    General: Skin is warm and dry.  Neurological:     Mental Status: She is alert.     Comments: AAOx3 CN 2-12 intact, speech clear visual fields intact; no apparent facial droop 5/5 strength in LUE and LLE 3-4/5 strength in RUE and RLE Sensation to light touch intact in b/l UE and LE Normal FNF  Psychiatric:        Mood and Affect: Mood normal.    ED Results /  Procedures / Treatments   Labs (all labs ordered are listed, but only abnormal results are displayed) Labs Reviewed  COMPREHENSIVE METABOLIC PANEL - Abnormal; Notable for the following components:      Result Value   Glucose, Bld 119 (*)    All other components within normal limits  PROTIME-INR  CBC  DIFFERENTIAL  URINALYSIS, ROUTINE W REFLEX MICROSCOPIC    EKG EKG Interpretation  Date/Time:  Monday June 19 2021 12:23:27 EDT Ventricular Rate:  86 PR Interval:  146 QRS Duration: 86 QT Interval:  359 QTC Calculation: 430 R Axis:   31 Text Interpretation: Sinus rhythm Minimal ST elevation, anterior leads Confirmed by Marianna Fuss ([id number]) on 06/19/2021 1:50:41 PM  Radiology CT ANGIO HEAD NECK W WO CM  Result Date: 06/19/2021 CLINICAL DATA:  Stroke/TIA. Left facial droop beginning 1030 hours. Headache. EXAM: CT ANGIOGRAPHY HEAD AND NECK TECHNIQUE: Multidetector CT imaging of the head and neck was performed using the standard protocol during bolus administration of intravenous contrast. Multiplanar CT image reconstructions and MIPs were obtained to evaluate the vascular anatomy. Carotid stenosis measurements (when applicable) are obtained utilizing NASCET criteria, using the distal internal carotid diameter as the denominator. CONTRAST:  OMNIPAQUE IOHEXOL 350 MG/ML SOLN COMPARISON:  Prior imaging 02/24/2021. FINDINGS: CT HEAD FINDINGS Brain: Subtle low density in the left pons consistent with the infarction which was acute in July of this year. Elsewhere, the brain has normal appearance without evidence of atrophy, old or acute infarction, mass lesion, hemorrhage, hydrocephalus or extra-axial collection Vascular: There is atherosclerotic calcification of the major vessels at the base of the brain. Skull: Negative Sinuses: Small left ethmoid mucocele as seen previously. Orbits: Normal Review of the MIP images confirms the above findings CTA NECK FINDINGS Aortic arch: Aortic  atherosclerosis. Right carotid system: Common carotid artery widely patent to the bifurcation. Carotid artery is tortuous. Carotid bifurcation does not show soft or calcified plaque or any stenosis. Cervical ICA is tortuous but patent. Left carotid system: Common carotid artery patent to the bifurcation. Small amount of calcified plaque at the bifurcation but no stenosis. Cervical ICA is tortuous but patent. Vertebral arteries: 50% stenosis of the left vertebral artery origin. Right vertebral artery origin widely patent. Both vertebral arteries appear normal through the cervical region to the foramen magnum. Skeleton: Mild scoliosis. Prominent anterior bridging osteophytes. Mild spondylosis. Other neck: Enlarged heterogeneous thyroid gland. This was evaluated in July of this year with provided recommendations. Upper chest: Normal Review of the MIP images confirms  the above findings CTA HEAD FINDINGS Anterior circulation: Both internal carotid arteries are widely patent through the skull base and siphon regions. There is atherosclerotic calcification in the carotid siphon regions but no stenosis greater than 30% suspected. The anterior and middle cerebral vessels are patent. No large vessel occlusion or correctable proximal stenosis. Posterior circulation: Both vertebral arteries are patent through the foramen magnum to the basilar. There is atherosclerotic disease of the left vertebral V4 segment but no stenosis greater than 30%. No basilar stenosis. Posterior circulation branch vessels are patent. More distal PCA branches show atherosclerotic irregularity. Venous sinuses: Patent and normal. Anatomic variants: None significant. Review of the MIP images confirms the above findings IMPRESSION: No acute head CT finding. Subtle evidence of the left pontine stroke which was acute in July of this year. Otherwise negative. No intracranial large or medium vessel occlusion. Minimal atherosclerotic change at the left carotid  bifurcation but no stenosis. 50% stenosis of the left vertebral artery origin. Atherosclerotic disease and tortuosity of the left vertebral artery V4 segment, but without stenosis greater than 30%. More distal PCA branches show atherosclerotic irregularity. Enlarged heterogeneous thyroid, evaluated by ultrasound in July of this year with given recommendations. Electronically Signed   By: Paulina Fusi M.D.   On: 06/19/2021 15:10    Procedures Procedures   Medications Ordered in ED Medications  iohexol (OMNIPAQUE) 350 MG/ML injection 100 mL (100 mLs Intravenous Contrast Given 06/19/21 1439)    ED Course  I have reviewed the triage vital signs and the nursing notes.  Pertinent labs & imaging results that were available during my care of the patient were reviewed by me and considered in my medical decision making (see chart for details).    MDM Rules/Calculators/A&P                           75 year old lady presents to ER with concern for facial droop.  Has notable history of recent stroke, residual right-sided deficits.  On my assessment, I do not appreciate any significant facial droop.  Outside of tPA window, presentation not consistent with LVO.  Deferred stroke alert.  Concern for possibility of TIA versus small stroke.  Will check basic labs, CTA head and neck.  Will discuss case with neurology once this has been completed.  Signed out to Pfeiffer who will reassess patient, follow-up on CT and discuss case with neurology.  Final Clinical Impression(s) / ED Diagnoses Final diagnoses:  Facial droop  H/O: CVA (cerebrovascular accident)    Rx / DC Orders ED Discharge Orders     None        Milagros Loll, MD 06/20/21 [phone]

## 2021-06-19 NOTE — ED Provider Notes (Addendum)
Old stroke. CTA strokew/u. Consult neurology Physical Exam  BP (!) 160/94   Pulse 80   Temp 98.6 F (37 C) (Oral)   Resp 17   SpO2 100%   Physical Exam  ED Course/Procedures     Procedures  MDM   Symptoms have completely resolved since the patient has been in the emergency department.  Case reviewed with Dr. Bing Neighbors neurology.  She is reviewed the patient's history and chart.  At this time appropriate for discharge and close follow-up with her neurologist.     Arby Barrette, MD 06/19/21 1528    Arby Barrette, MD 06/19/21 1733

## 2021-06-19 NOTE — ED Notes (Signed)
Patient transported to CT 

## 2021-06-20 ENCOUNTER — Ambulatory Visit: Payer: Medicare Other | Admitting: Physical Therapy

## 2021-06-20 ENCOUNTER — Ambulatory Visit: Payer: Medicare Other | Admitting: Occupational Therapy

## 2021-06-22 ENCOUNTER — Ambulatory Visit: Payer: Medicare Other | Admitting: Occupational Therapy

## 2021-06-22 ENCOUNTER — Ambulatory Visit: Payer: Medicare Other | Admitting: Physical Therapy

## 2021-06-28 ENCOUNTER — Ambulatory Visit: Payer: Medicare Other | Admitting: Physical Therapy

## 2021-06-28 ENCOUNTER — Encounter: Payer: Self-pay | Admitting: Physical Therapy

## 2021-06-28 ENCOUNTER — Other Ambulatory Visit: Payer: Self-pay

## 2021-06-28 ENCOUNTER — Ambulatory Visit: Payer: Medicare Other | Attending: Physician Assistant | Admitting: Occupational Therapy

## 2021-06-28 DIAGNOSIS — R278 Other lack of coordination: Secondary | ICD-10-CM

## 2021-06-28 DIAGNOSIS — R2681 Unsteadiness on feet: Secondary | ICD-10-CM | POA: Insufficient documentation

## 2021-06-28 DIAGNOSIS — M6281 Muscle weakness (generalized): Secondary | ICD-10-CM | POA: Diagnosis present

## 2021-06-28 DIAGNOSIS — R261 Paralytic gait: Secondary | ICD-10-CM

## 2021-06-28 DIAGNOSIS — I639 Cerebral infarction, unspecified: Secondary | ICD-10-CM

## 2021-06-28 DIAGNOSIS — I69353 Hemiplegia and hemiparesis 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: Secondary | ICD-10-CM

## 2021-06-28 NOTE — Therapy (Signed)
[place]. [place], [place], [address] Phone: [phone]   Fax:  [phone]  Physical Therapy Treatment  Patient Details  Name: Carrie Kline MRN: [id number] Date of Birth: March 20, 1946 Referring Provider (PT): Cathlyn Parsons, PA-C   Encounter Date: 06/28/2021   PT End of Session - 06/28/21 1356     Visit Number 19    Number of Visits 26    Date for PT Re-Evaluation 07/26/21    PT Start Time 8937    PT Stop Time 1228    PT Time Calculation (min) 44 min    Behavior During Therapy [facility] for tasks assessed/performed             Past Medical History:  Diagnosis Date   Gout    Seasonal allergies    Stroke [facility])     Past Surgical History:  Procedure Laterality Date   ABDOMINAL HYSTERECTOMY     BACK SURGERY     GANGLION CYST EXCISION      There were no vitals filed for this visit.   Subjective Assessment - 06/28/21 1150     Subjective She went to ER at [facility] on 10/24 due to feeling facial droop and pain in L side of head. CT scan and blood work which were all negative. She was exhausted the next day and was unable ot make it to therapy. She feels better now.    Patient is accompained by: Family member    Pertinent History gout, back surgery    Limitations Walking;House hold activities;Lifting;Standing    How long can you stand comfortably? ~3 min    How long can you walk comfortably? 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 No/denies    Pain Onset More than a month ago                [facility] PT Assessment - 06/28/21 0001       Strength   Strength Assessment Site Hip;Knee    Right Hip Flexion 3-/5    Right Hip Extension 3-/5    Left Hip  Flexion 4-/5    Left Hip Extension 4-/5    Right/Left Knee Right;Left    Right Knee Flexion 2+/5    Right Knee Extension 3-/5    Left Knee Flexion 4-/5    Left Knee Extension 4-/5      Transfers   Five time sit to stand comments  12.7 sec, 12.4 sec, 10.6 sec      Balance   Balance Assessed Yes      Standardized Balance Assessment   Standardized Balance Assessment Berg Balance Test      Berg Balance Test   Sit to Stand Able to stand  independently using hands    Standing Unsupported Able to stand safely 2 minutes    Sitting with Back Unsupported but Feet Supported on Floor or Stool Able to sit safely and securely 2 minutes    Stand to Sit Controls descent by using hands    Transfers Able to transfer safely, definite need of hands    Standing Unsupported with Eyes Closed Able to stand 10 seconds with supervision    Standing Unsupported  with Feet Together Able to place feet together independently and stand 1 minute safely    From Standing, Reach Forward with Outstretched Arm Can reach confidently >25 cm (10")    From Standing Position, Pick up Object from Floor Able to pick up shoe, needs supervision    From Standing Position, Turn to Look Behind Over each Shoulder Turn sideways only but maintains balance    Turn 360 Degrees Able to turn 360 degrees safely but slowly    Standing Unsupported, Alternately Place Feet on Step/Stool Able to stand independently and complete 8 steps >20 seconds    Standing Unsupported, One Foot in Front Able to plae foot ahead of the other independently and hold 30 seconds    Standing on One Leg Tries to lift leg/unable to hold 3 seconds but remains standing independently    Total Score 42      Timed Up and Go Test   TUG Normal TUG    Normal TUG (seconds) 14.2                                      PT Short Term Goals - 06/28/21 1200       PT SHORT TERM GOAL #2   Title Patient will be I with updated HEP    Baseline  Initiated new exercises.    Time 1    Period Weeks    Status New    Target Date 07/06/21               PT Long Term Goals - 06/28/21 1200       PT LONG TERM GOAL #1   Title Patient to be independent with advanced HEP.    Time 4    Period Weeks    Status On-going    Target Date 07/26/21      PT LONG TERM GOAL #2   Title Patient to demonstrate R LE strength >/=4/5.    Baseline 3+/5 hip and knee, ankle deferred, wears AFO    Time 4    Period Weeks    Status On-going    Target Date 07/26/21      PT LONG TERM GOAL #3   Title Patient to complete TUG in <14 sec with LRAD in order to decrease risk of falls.    Baseline 14.7    Time 4    Period Weeks    Status Not Met    Target Date 07/26/21      PT LONG TERM GOAL #4   Title Patient to complete 5xSTS in <15 sec in order to decrease risk of falls.    Baseline 10.6 sec    Status Achieved      PT LONG TERM GOAL #5   Title Patient to score atleast 47/56 on Berg in order to decrease risk of falls.    Baseline 42/56    Time 4    Period Weeks    Status On-going    Target Date 07/26/21                   Plan - 06/28/21 1358     Clinical Impression Statement Patient reported scary episode of perceived L facial droop and L headache. She went to the ER where they performed tests including CT scan and blood work that all came back negative. She feel sbetter now. Performed re-assessment to determine need for [facility]. Paiten  thas progressed, but continues to demonstrate decresaed balance, and gait abnormalities that cause an increaed fall risk. She will benefit from 4 more weeks of PT to maximize safety and establish a final HEP to continue progress after D/C.Marland Kitchen    Personal Factors and Comorbidities Comorbidity 2;Age;Time since onset of injury/illness/exacerbation;Past/Current Experience    Comorbidities gout, back surgery    Examination-Activity Limitations Bathing;Transfers;Locomotion Level;Bed Mobility;Bend;Sit;Caring for  Others;Carry;Squat;Stairs;Stand;Toileting;Lift;Hygiene/Grooming;Dressing    Examination-Participation Restrictions Church;Meal Prep;Cleaning;Community Activity;Shop;Laundry    Stability/Clinical Decision Making Evolving/Moderate complexity    Clinical Decision Making Moderate    Rehab Potential Good    PT Frequency 2x / week    PT Duration Other (comment)   1w   PT Treatment/Interventions ADLs/Self Care Home Management;Cryotherapy;DME Instruction;Electrical Stimulation;Ultrasound;Moist Heat;Gait training;Stair training;Functional mobility training;Therapeutic activities;Therapeutic exercise;Balance training;Neuromuscular re-education;Patient/family education;Orthotic Fit/Training;Manual techniques;Splinting;Energy conservation;Dry needling;Passive range of motion;Taping;Vasopneumatic Device;Visual/perceptual remediation/compensation    PT Next Visit Plan Emphasize coordinated motor control in RLE and in trunk to increaed balance and stability in standing and gait.    PT Home Exercise Plan UP to 15 minutes, along iwth HEP with ich was progressed.    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 [place], [place], [address] Phone: [phone]   Fax:  [phone]  Name: Carrie Kline MRN: [id number] Date of Birth: 1945/12/27

## 2021-06-29 NOTE — Therapy (Signed)
[facility] [address]. [place], [place], [address] Phone: [phone]   Fax:  [phone]  Occupational Therapy Treatment  Patient Details  Name: Carrie Kline MRN: [id number] Date of Birth: 1946/08/06 Referring Provider (OT): Mariam Dollar, PA-C  Encounter Date: 06/28/2021   OT End of Session - 06/28/21 1119     Visit Number 19    Number of Visits 25   Initial 17 + 8 additional visits (2x/week)   Date for OT Re-Evaluation 07/13/21    Authorization Type BCBS Medicare (primary); Medicaid [place] (secondary)    Authorization Time Period VL: [place]    Progress Note Due on Visit 20    OT Start Time 1100    OT Stop Time 1145    OT Time Calculation (min) 45 min    Activity Tolerance Patient tolerated treatment well    Behavior During Therapy WFL for tasks assessed/performed            Past Medical History:  Diagnosis Date   Gout    Seasonal allergies    Stroke [facility])    Past Surgical History:  Procedure Laterality Date   ABDOMINAL HYSTERECTOMY     BACK SURGERY     GANGLION CYST EXCISION     There were no vitals filed for this visit.   Subjective Assessment - 06/28/21 1103     Subjective  Last Monday pt reports she woke up and felt like her face was drooping (pointed to L side) and had a "funny feeling" in her head; reported it felt like throbbing/pulsating. Presented to the ED and various tests were completed w/ no significant results and pt was d/c same day.    Patient is accompanied by: Family member   daughter [name]   Pertinent History CVA w/ R-sided weakness on 02/24/21; Hx of gout and back surgery    Patient Stated Goals "Moving my R arm"    Currently in Pain? No/denies    Pain Onset --             Treatment/Exercises - 06/28/21    PROM OT performed gentle stretching/PROM of R shoulder flexion to increase ROM w/ additional benefit of potential increase in motor function, functional reach, decreased joint  stiffness. Pt positioned in supine for increased scapular alignment and support. Discomfort reported during first repetition w/ pain resolving w/ increased reps. Arc of motion increased slightly w/ repetition and pt able to achieve approx 120 degrees of flexion.     AAROM Closed-chain BUE shoulder flexion w/ cane and pt positioned in supine to facilitate scapular alignment w/ retraction and decrease atypical compensatory movement patterns observed when pt attempts movement in seated position. OT provided input at R shoulder to decrease elevation and upper arm to facilitate true flexion; additional verbal and tactile cues to emphasize completing arc of motion w/ shoulder vs extending range via elbow flexion.            OT Education - 06/28/21 1117     Education Details OT continued CVA condition-specific education, focusing on addressing recent ED visit, understanding of secondary stroke prevalence and prevention, pt's individual recovery process, and overall understanding of condition. Pt was receptive and her questions were answered as effectively as possible within OT scope of practice.    Person(s) Educated Patient    Methods Explanation;Handout    Comprehension Verbalized understanding             OT Short Term Goals - 06/15/21 1023  OT SHORT TERM GOAL #1   Title Pt will improve independence w/ functional FM tasks (e.g., clothing manipulatives) as evidenced by being able to complete 9HPT w/ R hand    Baseline Completed 4 pegs in >1 min w R hand    Time 4    Period Weeks    Status Achieved   04/20/21 - 1 min, 23 sec   Target Date 04/28/21      OT SHORT TERM GOAL #2   Title Pt will improve AROM of R shoulder scaption by at least 20 degrees to facilitate improved functional reach   Revised goal from achieving shoulder flexion to scaption due to focus on function   Baseline R shoulder scaption 28 degrees    Time 4    Period Weeks    Status Achieved   06/06/21 - 96* of  scaption w/ difficulty attaining true flexion beyond chest height; 05/02/21 - 52* of scaption     OT SHORT TERM GOAL #3   Title Pt will improve AROM of R elbow extension by at least 15 degrees to facilitate improved functional reach    Baseline Achieves 52 degrees of flexion when attempting elbow extension    Time 4    Period Weeks    Status Achieved   04/18/21 - 21* of flex w/ elbow ext     OT SHORT TERM GOAL #4   Title Pt will increase R hand grip strength by at least 4 lbs to improve independence w/ IADLs    Baseline 04/25/21 - RUE 4# (LUE 39#)    Time 4    Period Weeks    Status On-going   06/06/21 - 6# w/ RUE            OT Long Term Goals - 06/15/21 1024       OT LONG TERM GOAL #1   Title Pt will be able to cut food w/ Mod I, using AE/compensatory strategies prn, 100% of the time    Baseline Frequently requires assist cutting food    Time 8    Period Weeks    Status Achieved   05/04/21     OT LONG TERM GOAL #2   Title Pt will be able to fasten/unfasten clothing manipulatives in 5/5 trials w/ Mod I, using AE prn    Baseline Requires assist w/ fasteners    Time 8    Period Weeks    Status Achieved   05/04/21     OT LONG TERM GOAL #3   Title Pt will demonstrate simulated tub/shower transfer safely w/ Mod I    Baseline Min A w/ tub/shower transfer    Time 8    Period Weeks    Status Achieved   06/13/21 - per pt report     OT LONG TERM GOAL #4   Title Pt will demonstrate independence w/ HEP designed for coordination, ROM, strength, and functional use of RUE by d/c    Baseline No HEP at this time    Time 8    Period Weeks    Status On-going      OT LONG TERM GOAL #5   Title Pt will be able to safely place/retrieve medium-weight object (approx 5 lbs) at an overhead height w/ BUEs    Baseline Decreased RUE ROM/strength impacting functional reach    Time 4    Period Weeks    Status On-going    Target Date 07/13/21      OT LONG TERM GOAL #  6   Title Pt will demonstrate  improved RUE GMC as evidenced by increasing Box and Blocks score to at least 52 blocks    Baseline LUE 68; RUE 47    Time 4    Period Weeks    Status On-going    Target Date 07/13/21             Plan - 06/28/21 1118     Clinical Impression Statement Considering pt's report of feeling "scared" after her ED visit last week, as well as a recent instance of the loss of someone she knew due to a stroke, OT provided encouragement and condition-specific eduction as appropriate. OT discussed objective measurements and anecdotal evidence of her improvement and progress toward functional goals, as well as education including encouragement of pt's expressed lifestyle changes to assist w/ secondary stroke prevention, understanding of additional risk fators, and understanding the benefit of seeking additional medical care promptly with signs/symptoms of concern. OT also continued to facilitate RUE ROM w/ pt demonstrating improvement in arc of motion and verbalizing understanding of correct positioning during activity; pt continues to benefit from bilateral arm training and shoulder exercises in supine for scapular stability and positioning.    OT Occupational Profile and History Detailed Assessment- Review of Records and additional review of physical, cognitive, psychosocial history related to current functional performance    Occupational performance deficits (Please refer to evaluation for details): ADL's;IADL's;Work;Leisure;Social Participation    Body Structure / Function / Physical Skills ADL;ROM;UE functional use;Mobility;FMC;Decreased knowledge of use of DME;Balance;Body mechanics;Dexterity;Gait;Vision;GMC;Strength;Endurance;Coordination;IADL;Tone    Rehab Potential Good    Clinical Decision Making Several treatment options, min-mod task modification necessary    Comorbidities Affecting Occupational Performance: May have comorbidities impacting occupational performance    Modification or Assistance to  Complete Evaluation  Min-Moderate modification of tasks or assist with assess necessary to complete eval    OT Frequency 2x / week    OT Duration 8 weeks    OT Treatment/Interventions Self-care/ADL training;Electrical Stimulation;Therapeutic exercise;Visual/perceptual remediation/compensation;Patient/family education;Splinting;Neuromuscular education;Moist Heat;Aquatic Therapy;Biofeedback;Energy conservation;Building services engineer;Therapeutic activities;Balance training;Passive range of motion;Manual Therapy;DME and/or AE instruction;Ultrasound;Cryotherapy    Plan Continue w/ closed-chain ROM/PROM in supine; NMES for elbow extension and functional reach    Consulted and Agree with Plan of Care Patient;Family member/caregiver    Family Member Consulted Kim (daughter)            Patient will benefit from skilled therapeutic intervention in order to improve the following deficits and impairments:   Body Structure / Function / Physical Skills: ADL, ROM, UE functional use, Mobility, FMC, Decreased knowledge of use of DME, Balance, Body mechanics, Dexterity, Gait, Vision, GMC, Strength, Endurance, Coordination, IADL, Tone   Visit Diagnosis: 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)  Other lack of coordination  Muscle weakness (generalized)   Problem List Patient Active Problem List   Diagnosis Date Noted   Brainstem infarct, acute (HCC) 03/01/2021   Acute cerebral infarction (HCC) 02/24/2021   Thyroid nodule 02/24/2021   Diabetes (HCC) 02/24/2021    Rosie Fate, OTR/L, MSOT 06/29/2021, 10:20 AM  [facility] [address] [place], [place], [address] Phone: [phone]   Fax:  [phone]  Name: Carrie Kline MRN: [id number] Date of Birth: 24-Aug-1946

## 2021-07-05 ENCOUNTER — Encounter: Payer: Self-pay | Admitting: Family Medicine

## 2021-07-05 ENCOUNTER — Other Ambulatory Visit: Payer: Self-pay

## 2021-07-05 ENCOUNTER — Encounter: Payer: Self-pay | Admitting: Physical Therapy

## 2021-07-05 ENCOUNTER — Ambulatory Visit (INDEPENDENT_AMBULATORY_CARE_PROVIDER_SITE_OTHER): Payer: Medicare Other | Admitting: Family Medicine

## 2021-07-05 ENCOUNTER — Ambulatory Visit: Payer: Medicare Other | Admitting: Physical Therapy

## 2021-07-05 ENCOUNTER — Encounter: Payer: Self-pay | Admitting: Occupational Therapy

## 2021-07-05 ENCOUNTER — Ambulatory Visit: Payer: Medicare Other | Admitting: Occupational Therapy

## 2021-07-05 VITALS — BP 155/84 | HR 87 | Temp 98.0°F | Resp 16 | Wt 191.4 lb

## 2021-07-05 DIAGNOSIS — Z23 Encounter for immunization: Secondary | ICD-10-CM | POA: Diagnosis not present

## 2021-07-05 DIAGNOSIS — I639 Cerebral infarction, unspecified: Secondary | ICD-10-CM

## 2021-07-05 DIAGNOSIS — R278 Other lack of coordination: Secondary | ICD-10-CM

## 2021-07-05 DIAGNOSIS — R2681 Unsteadiness on feet: Secondary | ICD-10-CM

## 2021-07-05 DIAGNOSIS — E7849 Other hyperlipidemia: Secondary | ICD-10-CM

## 2021-07-05 DIAGNOSIS — M6281 Muscle weakness (generalized): Secondary | ICD-10-CM

## 2021-07-05 DIAGNOSIS — I69353 Hemiplegia and hemiparesis 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: Secondary | ICD-10-CM

## 2021-07-05 MED ORDER — ATORVASTATIN CALCIUM 40 MG PO TABS: 40.0000 mg | ORAL_TABLET | Freq: Every day | ORAL | 1 refills | Status: DC

## 2021-07-05 NOTE — Therapy (Signed)
[place]. [place], [place], [address] Phone: [phone]   Fax:  [phone]  Physical Therapy Treatment  Patient Details  Name: Carrie Kline MRN: [id number] Date of Birth: Aug 23, 1946 Referring Provider (PT): Cathlyn Parsons, PA-C   Encounter Date: 07/05/2021  Progress Note Reporting Period 05/10/21 to 06/28/21  See note below for Objective Data and Assessment of Progress/Goals.       PT End of Session - 07/05/21 1725     Visit Number 20    Number of Visits 26    Date for PT Re-Evaluation 07/26/21    Authorization Type BCBS and Medicaid    PT Start Time 1448    PT Stop Time 1528    PT Time Calculation (min) 40 min    Equipment Utilized During Treatment Gait belt    Activity Tolerance Patient tolerated treatment well;Patient limited by fatigue    Behavior During Therapy WFL for tasks assessed/performed             Past Medical History:  Diagnosis Date   Gout    Seasonal allergies    Stroke [facility])     Past Surgical History:  Procedure Laterality Date   ABDOMINAL HYSTERECTOMY     BACK SURGERY     GANGLION CYST EXCISION      There were no vitals filed for this visit.   Subjective Assessment - 07/05/21 1448     Subjective I still want to work on strength/balance; my left arm is hurting quite a bit from the flu shot and I'm not feeling well today because of that, I don't do pain well    Patient is accompained by: Family member    Pertinent History gout, back surgery    Currently in Pain? Yes    Pain Score 5     Pain Location Arm    Pain Orientation Left    Pain Descriptors / Indicators Sore    Pain Type Acute pain    Pain Onset Today   received flu shot in LUE this morning                              [facility] Adult PT Treatment/Exercise - 07/05/21 0001       Knee/Hip Exercises: Standing   Heel Raises Both;1 set;20 reps    Heel Raises Limitations 2 inch box under  L LE for increased wt shift onto R LE    Knee Flexion Both;1 set;10 reps    Hip Flexion Both;1 set;10 reps    Other Standing Knee Exercises cross midilne reaches for cones to improve R weight shift at various levels; standing and lifting foot over cone with fingertip touches for balance 1x10 B    Other Standing Knee Exercises toe taps on 4 inch box with focus on motor control and coordination 1x20; side steps; turning in circles quickly but safely for balance CW and CCW; sit to stands 1x10                     PT Education - 07/05/21 1725     Education Details exercise form and purpose    Person(s) Educated Patient    Methods Explanation;Demonstration    Comprehension Verbalized understanding;Returned demonstration              PT Short Term Goals - 06/28/21 1200       PT SHORT TERM GOAL #2  Title Patient will be I with updated HEP    Baseline Initiated new exercises.    Time 1    Period Weeks    Status New    Target Date 07/06/21               PT Long Term Goals - 06/28/21 1200       PT LONG TERM GOAL #1   Title Patient to be independent with advanced HEP.    Time 4    Period Weeks    Status On-going    Target Date 07/26/21      PT LONG TERM GOAL #2   Title Patient to demonstrate R LE strength >/=4/5.    Baseline 3+/5 hip and knee, ankle deferred, wears AFO    Time 4    Period Weeks    Status On-going    Target Date 07/26/21      PT LONG TERM GOAL #3   Title Patient to complete TUG in <14 sec with LRAD in order to decrease risk of falls.    Baseline 14.7    Time 4    Period Weeks    Status Not Met    Target Date 07/26/21      PT LONG TERM GOAL #4   Title Patient to complete 5xSTS in <15 sec in order to decrease risk of falls.    Baseline 10.6 sec    Status Achieved      PT LONG TERM GOAL #5   Title Patient to score atleast 47/56 on Berg in order to decrease risk of falls.    Baseline 42/56    Time 4    Period Weeks    Status  On-going    Target Date 07/26/21                   Plan - 07/05/21 1726     Clinical Impression Statement Ms. Virgo arrives today reporting she is not feeling 100%- got her flu shot this morning and has been having increased arm pain/fatigue today after the shot, as such session performed to tolerance and modified PRN. General focus on gentle strengthening, improving R weight shift, balance, and functional activity tolerance. Seemed a bit short of breath but she reports this is her baseline. Will continue PT POC and progress as tolerated moving forward. Progress note completed- note objective measures were completed last session as follows: LE strength 2+ to 4-/5, 5x sit to stand 10.6 seconds best, Berg 42/56, TUG 14.2- see PT note 11/2 for more specific details of measurements.    Personal Factors and Comorbidities Comorbidity 2;Age;Time since onset of injury/illness/exacerbation;Past/Current Experience    Comorbidities gout, back surgery    Examination-Activity Limitations Bathing;Transfers;Locomotion Level;Bed Mobility;Bend;Sit;Caring for Others;Carry;Squat;Stairs;Stand;Toileting;Lift;Hygiene/Grooming;Dressing    Examination-Participation Restrictions Church;Meal Prep;Cleaning;Community Activity;Shop;Laundry    Stability/Clinical Decision Making Evolving/Moderate complexity    Clinical Decision Making Moderate    Rehab Potential Good    PT Frequency 2x / week    PT Duration Other (comment)   1w   PT Treatment/Interventions ADLs/Self Care Home Management;Cryotherapy;DME Instruction;Electrical Stimulation;Ultrasound;Moist Heat;Gait training;Stair training;Functional mobility training;Therapeutic activities;Therapeutic exercise;Balance training;Neuromuscular re-education;Patient/family education;Orthotic Fit/Training;Manual techniques;Splinting;Energy conservation;Dry needling;Passive range of motion;Taping;Vasopneumatic Device;Visual/perceptual remediation/compensation    PT Next Visit  Plan Emphasize coordinated motor control in RLE and in trunk to increaed balance and stability in standing and gait.    PT Home Exercise Plan UP to 15 minutes, along iwth HEP with ich was progressed.    Consulted and Agree with Plan of  Care Patient    Family Member Consulted 2 daughters             Patient will benefit from skilled therapeutic intervention in order to improve the following deficits and impairments:  Abnormal gait, Decreased coordination, Decreased range of motion, Difficulty walking, Increased fascial restricitons, Increased muscle spasms, Decreased safety awareness, Decreased activity tolerance, Improper body mechanics, Impaired flexibility, Decreased balance, Postural dysfunction, Decreased strength, Decreased mobility  Visit Diagnosis: Muscle weakness (generalized)  Unsteadiness on feet  Other lack of coordination     Problem List Patient Active Problem List   Diagnosis Date Noted   Brainstem infarct, 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 [place], [place], [address] Phone: [phone]   Fax:  [phone]  Name: Oneida Mckamey MRN: [id number] Date of Birth: [birth year]

## 2021-07-05 NOTE — Progress Notes (Signed)
Patient has no new concerns today.Patient here for 3 month f/up

## 2021-07-06 ENCOUNTER — Encounter: Payer: Self-pay | Admitting: Family Medicine

## 2021-07-06 DIAGNOSIS — E7849 Other hyperlipidemia: Secondary | ICD-10-CM | POA: Insufficient documentation

## 2021-07-06 NOTE — Therapy (Signed)
[place]. [place], [place], [address] Phone: [phone]   Fax:  [phone]  Occupational Therapy Treatment & Progress Note  Patient Details  Name: Carrie Kline MRN: [id number] Date of Birth: [birth year] Referring Provider (OT): Lauraine Rinne, PA-C   Encounter Date: 07/05/2021   OT End of Session - 07/05/21 1410     Visit Number 20    Number of Visits 25   Initial 17 + 8 additional visits (2x/week)   Date for OT Re-Evaluation 07/13/21    Authorization Type BCBS Medicare (primary); Medicaid [place] (secondary)    Authorization Time Period VL: [place]    Progress Note Due on Visit 20    OT Start Time 1401    OT Stop Time 1443    OT Time Calculation (min) 42 min    Activity Tolerance Patient tolerated treatment well    Behavior During Therapy WFL for tasks assessed/performed            Occupational Therapy Progress Note  Dates of Reporting Period: 05/10/21 to 07/05/21  Objective Reports of Subjective Statement: Demonstrates improved AROM, strength, and coordination of RUE, working toward pt-stated goal of "moving my R arm;" see goals below for details   Objective Measurements: 9-Hole Peg Test, AROM via goniometer, grip strength via dynamometer, and levels of independence w/ functional activities, Box and Blocks   Goal Update: Pt has met 3/4 STGs and 3/6 LTGs; continuing to progress toward remaining functional goals   Plan: Continue address neuromuscular reeducation and improved functional use of RUE and R hand (e.g., weight bearing, NMES, strengthening, control/coordination exercises); progress and update HEP; and provide relevant condition-specific education prn   Reason Skilled Services are Required: Pt is currently 4 months post-CVA and will continue to benefit from skilled OT services to address decreased functional use of RUE, gross and fine motor control and coordination, and participation in ADLs to facilitate  improved independence  Past Medical History:  Diagnosis Date   Gout    Seasonal allergies    Stroke [facility])    Past Surgical History:  Procedure Laterality Date   ABDOMINAL HYSTERECTOMY     BACK SURGERY     GANGLION CYST EXCISION     There were no vitals filed for this visit.   Subjective Assessment - 07/05/21 1406     Subjective  Pt reports her L arm is sore because she got her flu shot earlier today at PCP appt; reports her MD said everything looks good!    Patient is accompanied by: Family member   [name]   Pertinent History CVA w/ R-sided weakness on 02/24/21; Hx of gout and back surgery    Patient Stated Goals "Moving my R arm"    Currently in Pain? Yes    Pain Score 5     Pain Location Arm    Pain Orientation Left    Pain Descriptors / Indicators Sore    Pain Onset Today   Rec'd flu shot in LUE this morning            Treatment/Exercises - 07/05/21    Manual Therapy: PROM  OT performed gentle stretching/PROM of R wrist extension and isolated composite finger flexion/extension, holding stretch at end-range each rep, to increase ROM w/ additional benefit of decreased joint stiffness/contracture and potential increase in motor function for grasp and release. Pt tolerated exercise w/out pain or discomfort; PROM slightly limited by increased joint stiffness and mild increased tone.  Pegboard Activity Copied pattern onto large pegboard w/ easy-grip pegs to facilitate neuromuscular reeducation of R hand coordination and dexterity, grasp and release w/ tripod pinch prehension, and GMC w/ good body mechanics. Pt able to complete activity w/ mod cues/demonstration at start of activity for spatial orientation to start pattern on pegboard and demonstrated good hand strength and min drops. OT provided additional cues to encourage in-hand manipulation of pegs when orienting each to pegboard. Pt also pulled pegs out of pegboard w/ tripod pinch and min difficulty, requiring  increased time.            OT Short Term Goals - 06/15/21 1023       OT SHORT TERM GOAL #1   Title Pt will improve independence w/ functional FM tasks (e.g., clothing manipulatives) as evidenced by being able to complete 9HPT w/ R hand    Baseline Completed 4 pegs in >1 min w R hand    Time 4    Period Weeks    Status Achieved   04/20/21 - 1 min, 23 sec   Target Date 04/28/21      OT SHORT TERM GOAL #2   Title Pt will improve AROM of R shoulder scaption by at least 20 degrees to facilitate improved functional reach   Revised goal from achieving shoulder flexion to scaption due to focus on function   Baseline R shoulder scaption 28 degrees    Time 4    Period Weeks    Status Achieved   06/06/21 - 96* of scaption w/ difficulty attaining true flexion beyond chest height; 05/02/21 - 52* of scaption     OT SHORT TERM GOAL #3   Title Pt will improve AROM of R elbow extension by at least 15 degrees to facilitate improved functional reach    Baseline Achieves 52 degrees of flexion when attempting elbow extension    Time 4    Period Weeks    Status Achieved   04/18/21 - 21* of flex w/ elbow ext     OT SHORT TERM GOAL #4   Title Pt will increase R hand grip strength by at least 4 lbs to improve independence w/ IADLs    Baseline 04/25/21 - RUE 4# (LUE 39#)    Time 4    Period Weeks    Status On-going   06/06/21 - 6# w/ RUE            OT Long Term Goals - 06/15/21 1024       OT LONG TERM GOAL #1   Title Pt will be able to cut food w/ Mod I, using AE/compensatory strategies prn, 100% of the time    Baseline Frequently requires assist cutting food    Time 8    Period Weeks    Status Achieved   05/04/21     OT LONG TERM GOAL #2   Title Pt will be able to fasten/unfasten clothing manipulatives in 5/5 trials w/ Mod I, using AE prn    Baseline Requires assist w/ fasteners    Time 8    Period Weeks    Status Achieved   05/04/21     OT LONG TERM GOAL #3   Title Pt will demonstrate  simulated tub/shower transfer safely w/ Mod I    Baseline Min A w/ tub/shower transfer    Time 8    Period Weeks    Status Achieved   06/13/21 - per pt report     OT LONG TERM GOAL #  4   Title Pt will demonstrate independence w/ HEP designed for coordination, ROM, strength, and functional use of RUE by d/c    Baseline No HEP at this time    Time 8    Period Weeks    Status On-going      OT LONG TERM GOAL #5   Title Pt will be able to safely place/retrieve medium-weight object (approx 5 lbs) at an overhead height w/ BUEs    Baseline Decreased RUE ROM/strength impacting functional reach    Time 4    Period Weeks    Status On-going    Target Date 07/13/21      OT LONG TERM GOAL #6   Title Pt will demonstrate improved RUE GMC as evidenced by increasing Box and Blocks score to at least 52 blocks    Baseline LUE 68; RUE 47    Time 4    Period Weeks    Status On-going    Target Date 07/13/21             Plan - 07/05/21 1429     Clinical Impression Statement Pt continues to make good progress toward goals and demonstrated greatly improved [facility] and dexterity during pegboard activity this session; pt able to reposition easy-grip pegs in-hand to correctly orient them to pegboard w/out assist from L hand or table. Pt does continue to demonstrate mild compensatory pattern of R shoulder elevation, but is able to self-correct body mechanics consistently w/ occasional tactile and/or verbal cues. Due to continued report of persistant stiffness in her hand, OT facilitate gentle stretching of wrist and hand and provided handout to pt to incorporate self-stretching exercises into HEP; pt returned demonstration of exercises w/ verbal cues and modeling for positioning.    OT Occupational Profile and History Detailed Assessment- Review of Records and additional review of physical, cognitive, psychosocial history related to current functional performance    Occupational performance deficits (Please refer  to evaluation for details): ADL's;IADL's;Work;Leisure;Social Participation    Body Structure / Function / Physical Skills ADL;ROM;UE functional use;Mobility;FMC;Decreased knowledge of use of DME;Balance;Body mechanics;Dexterity;Gait;Vision;GMC;Strength;Endurance;Coordination;IADL;Tone    Rehab Potential Good    Clinical Decision Making Several treatment options, min-mod task modification necessary    Comorbidities Affecting Occupational Performance: May have comorbidities impacting occupational performance    Modification or Assistance to Complete Evaluation  Min-Moderate modification of tasks or assist with assess necessary to complete eval    OT Frequency 2x / week    OT Duration 8 weeks    OT Treatment/Interventions Self-care/ADL training;Electrical Stimulation;Therapeutic exercise;Visual/perceptual remediation/compensation;Patient/family education;Splinting;Neuromuscular education;Moist Heat;Aquatic Therapy;Biofeedback;Energy conservation;Therapist, nutritional;Therapeutic activities;Balance training;Passive range of motion;Manual Therapy;DME and/or AE instruction;Ultrasound;Cryotherapy    Plan Closed-chain R shoulder ROM/PROM in supine; weight-bearing exercises through R hand for neuro reed/prop input; continue w/ grip strengthening (power grip exerciser); NMES for elbow extension and functional reach?   Consulted and Agree with Plan of Care Patient;Family member/caregiver    Family Member Consulted Carrie Kline (daughter)            Patient will benefit from skilled therapeutic intervention in order to improve the following deficits and impairments:   Body Structure / Function / Physical Skills: ADL, ROM, UE functional use, Mobility, FMC, Decreased knowledge of use of DME, Balance, Body mechanics, Dexterity, Gait, Vision, GMC, Strength, Endurance, Coordination, IADL, Tone   Visit Diagnosis: Hemiplegia and hemiparesis 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  (HCC)  Other lack of coordination  Muscle weakness (generalized)  Unsteadiness on feet   Problem List  Patient Active Problem List   Diagnosis Date Noted   Other hyperlipidemia 07/06/2021   Brainstem infarct, acute ([place]) 03/01/2021   Acute cerebral infarction [facility]) 02/24/2021   Thyroid nodule 02/24/2021   Diabetes ([place]) 02/24/2021    Kathrine Cords, OTR/L, MSOT  07/05/2021, 3:30 PM  [place]. [place], [place], [address] Phone: [phone]   Fax:  [phone]  Name: Carrie Kline MRN: [id number] Date of Birth: 01-15-1946

## 2021-07-06 NOTE — Progress Notes (Signed)
Established Patient Office Visit  Subjective:  Patient ID: Carrie Kline, female    DOB: Dec 10, 1945  Age: 75 y.o. MRN: [id number]  CC:  Chief Complaint  Patient presents with   Follow-up    3 month follow-up    HPI Carrie Kline presents for follow up of chronic med issues. She reports that she is continuing post stroke therapy and that her symptoms are continuing to improve/resolve. She denies acute complaints or concerns.   Past Medical History:  Diagnosis Date   Gout    Seasonal allergies    Stroke [facility]     Past Surgical History:  Procedure Laterality Date   ABDOMINAL HYSTERECTOMY     BACK SURGERY     GANGLION CYST EXCISION      Family History  Problem Relation Age of Onset   Heart attack Father    Diabetes Sister    Thyroid disease Daughter     Social History   Socioeconomic History   Marital status: Widowed    Spouse name: Not on file   Number of children: Not on file   Years of education: Not on file   Highest education level: Not on file  Occupational History   Not on file  Tobacco Use   Smoking status: Never   Smokeless tobacco: Never  Vaping Use   Vaping Use: Never used  Substance and Sexual Activity   Alcohol use: No   Drug use: No   Sexual activity: Not on file  Other Topics Concern   Not on file  Social History Narrative   Not on file   Social Determinants of Health   Financial Resource Strain: Not on file  Food Insecurity: Not on file  Transportation Needs: Not on file  Physical Activity: Not on file  Stress: Not on file  Social Connections: Not on file  Intimate Partner Violence: Not on file    ROS Review of Systems  All other systems reviewed and are negative.  Objective:   Today's Vitals: BP (!) 155/84   Pulse 87   Temp 98 F (36.7 C) (Oral)   Resp 16   Wt 191 lb 6.4 oz (86.8 kg)   SpO2 93%   BMI 33.90 kg/m   Physical Exam Vitals and nursing note reviewed.  Constitutional:      General: She is not in acute  distress. HENT:     Head: Normocephalic and atraumatic.  Cardiovascular:     Rate and Rhythm: Normal rate and regular rhythm.  Pulmonary:     Effort: Pulmonary effort is normal.     Breath sounds: Normal breath sounds.  Abdominal:     Palpations: Abdomen is soft.     Tenderness: There is no abdominal tenderness.  Musculoskeletal:     Comments: Utilizing rolling walker  Neurological:     Mental Status: She is alert and oriented to person, place, and time.     Motor: Weakness: right-sided - much improved.  Psychiatric:        Mood and Affect: Mood normal.        Behavior: Behavior normal.    Assessment & Plan:   1. Acute cerebral infarction (HCC) Continues to improve. Is still doing therapy  2. Other hyperlipidemia Meds refilled. Continue present management - atorvastatin (LIPITOR) 40 MG tablet; Take 1 tablet (40 mg total) by mouth daily.  Dispense: 90 tablet; Refill: 1    Outpatient Encounter Medications as of 07/05/2021  Medication Sig   aspirin EC 81 MG EC  tablet Take 1 tablet (81 mg total) by mouth daily. Swallow whole.   atorvastatin (LIPITOR) 40 MG tablet Take 1 tablet (40 mg total) by mouth daily.   [DISCONTINUED] atorvastatin (LIPITOR) 40 MG tablet Take 1 tablet (40 mg total) by mouth daily.   No facility-administered encounter medications on file as of 07/05/2021.    Follow-up: Return in about 6 months (around 01/02/2022) for follow up.   Tommie Raymond, MD

## 2021-07-11 ENCOUNTER — Encounter: Payer: Self-pay | Admitting: Physical Medicine & Rehabilitation

## 2021-07-11 ENCOUNTER — Ambulatory Visit: Payer: Medicare Other | Admitting: Occupational Therapy

## 2021-07-11 ENCOUNTER — Other Ambulatory Visit: Payer: Self-pay

## 2021-07-11 ENCOUNTER — Encounter: Payer: Self-pay | Admitting: Occupational Therapy

## 2021-07-11 ENCOUNTER — Encounter: Payer: Medicare Other | Attending: Registered Nurse | Admitting: Physical Medicine & Rehabilitation

## 2021-07-11 ENCOUNTER — Ambulatory Visit: Payer: Medicare Other | Admitting: Physical Therapy

## 2021-07-11 ENCOUNTER — Encounter: Payer: Self-pay | Admitting: Physical Therapy

## 2021-07-11 VITALS — BP 152/89 | HR 93 | Ht 63.0 in | Wt 193.2 lb

## 2021-07-11 DIAGNOSIS — I69353 Hemiplegia and hemiparesis 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: Secondary | ICD-10-CM | POA: Diagnosis not present

## 2021-07-11 DIAGNOSIS — R2681 Unsteadiness on feet: Secondary | ICD-10-CM

## 2021-07-11 DIAGNOSIS — I639 Cerebral infarction, unspecified: Secondary | ICD-10-CM

## 2021-07-11 DIAGNOSIS — R278 Other lack of coordination: Secondary | ICD-10-CM

## 2021-07-11 DIAGNOSIS — M6281 Muscle weakness (generalized): Secondary | ICD-10-CM

## 2021-07-11 DIAGNOSIS — R261 Paralytic gait: Secondary | ICD-10-CM

## 2021-07-11 DIAGNOSIS — I69351 Hemiplegia and hemiparesis following cerebral infarction affecting right dominant side: Secondary | ICD-10-CM | POA: Diagnosis present

## 2021-07-11 NOTE — Therapy (Signed)
[facility]. [place], [place], [address] Phone: [phone]   Fax:  [phone]  Physical Therapy Treatment  Patient Details  Name: Carrie Kline MRN: [id number] Date of Birth: 23-Apr-1946 Referring Provider (PT): Cathlyn Parsons, PA-C   Encounter Date: 07/11/2021   PT End of Session - 07/11/21 1145     Visit Number 21    Number of Visits 26    Date for PT Re-Evaluation 07/26/21    Authorization Type BCBS and Medicaid    PT Start Time 1012    PT Stop Time 1055    PT Time Calculation (min) 43 min    Activity Tolerance Patient tolerated treatment well;Patient limited by fatigue             Past Medical History:  Diagnosis Date   Gout    Seasonal allergies    Stroke [facility])     Past Surgical History:  Procedure Laterality Date   ABDOMINAL HYSTERECTOMY     BACK SURGERY     GANGLION CYST EXCISION      There were no vitals filed for this visit.   Subjective Assessment - 07/11/21 1014     Subjective Still with c/o pain in the left shoulder/arm after the flu shot last week.  no falls    Currently in Pain? Yes    Pain Score 4     Pain Location Arm    Pain Orientation Left    Pain Descriptors / Indicators Sore                               [facility] Adult PT Treatment/Exercise - 07/11/21 0001       Ambulation/Gait   Gait Comments with light HHA 2x100      High Level Balance   High Level Balance Activities Side stepping;Backward walking;Direction changes;Negotitating around obstacles    High Level Balance Comments cone toe touches with SPC, then on firm airex 4" toe touches, then on the softer airex, ball toss, cone pickups      Knee/Hip Exercises: Aerobic   Nustep level 5 x 6 minutes      Knee/Hip Exercises: Machines for Strengthening   Cybex Knee Extension 5# both legs 2x10 with her trying to eep right shin touching the pad    Cybex Knee Flexion 20# 2x10 with both legs, 10#  right leg only, only able to do partial ROM    Cybex Leg Press 20# both legs , right leg no weight x 10, then 20 # right only 3x5 reps      Knee/Hip Exercises: Seated   Knee/Hip Flexion toe taps 3 x 10 with good anterior tibialis                       PT Short Term Goals - 06/28/21 1200       PT SHORT TERM GOAL #2   Title Patient will be I with updated HEP    Baseline Initiated new exercises.    Time 1    Period Weeks    Status New    Target Date 07/06/21               PT Long Term Goals - 07/11/21 1151       PT LONG TERM GOAL #1   Title Patient to be independent with advanced HEP.    Status Partially Met  PT LONG TERM GOAL #2   Title Patient to demonstrate R LE strength >/=4/5.    Status On-going      PT LONG TERM GOAL #3   Title Patient to complete TUG in <14 sec with LRAD in order to decrease risk of falls.    Status On-going                   Plan - 07/11/21 1146     Clinical Impression Statement Patient continues to have some arm pain from the flu shot.  She is demonstrating some increased ankle DF.  She is doing better with her walking with the [facility] and can do wihtout just slow and deliberate.  Struggles with the dynamic surfaces, I had her do some cone pickups and has some issues with getting that low without loss of balance    PT Next Visit Plan continue to work on her function and balance    Consulted and Agree with Plan of Care Patient             Patient will benefit from skilled therapeutic intervention in order to improve the following deficits and impairments:  Abnormal gait, Decreased coordination, Decreased range of motion, Difficulty walking, Increased fascial restricitons, Increased muscle spasms, Decreased safety awareness, Decreased activity tolerance, Improper body mechanics, Impaired flexibility, Decreased balance, Postural dysfunction, Decreased strength, Decreased mobility  Visit Diagnosis: Muscle weakness  (generalized)  Unsteadiness on feet  Other lack of coordination  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 [place], [place], [address] Phone: [phone]   Fax:  [phone]  Name: Kylynn Street MRN: [id number] Date of Birth: [birth year]

## 2021-07-11 NOTE — Progress Notes (Signed)
Subjective:    Patient ID: Carrie Kline, female    DOB: 1945-09-20, 75 y.o.   MRN: [id number]  75 y.o. right-handed female with history of gout as well as back surgery.  Per chart review lives with her children.  Independent prior to admission.  Presented 02/24/2021 with acute onset of right side weakness mild slurred speech.  CT/MRI showed acute infarct left pons.  Slight edema without mass-effect.  Patient did not receive tPA.  CT angiogram head and neck negative for large vessel occlusion.  Echocardiogram with ejection fraction of 55 to 60% no wall motion abnormalities.  Admission chemistries unremarkable except glucose 240 hemoglobin A1c 7.4.  Maintained on aspirin as well as Plavix for CVA prophylaxis x3 weeks total then aspirin alone.  Subcutaneous Lovenox for DVT prophylaxis.  Incidental findings thyroid nodule ultrasound of thyroid showing parenchymal echotexture recommend outpatient follow-up.  Patient tolerating a regular diet.  Glucophage initiated for new findings of diabetes mellitus.  Due to patient's right side weakness therapy evaluations completed was admitted for a comprehensive rehab program.  Admit date: 03/01/2021 Discharge date: 03/24/2021 HPI Patient here for first physical medicine and rehabilitation follow-up after stroke admission.  She has been undergoing outpatient therapy with PT and OT.  She denies any cognitive issues.  She is widowed lives with one of her children in a two-level home 10 steps to enter.  She has no difficulty with bathroom accessibility.  No falls Uses cane in house, walker outside the home   Goals to walk without cane  Pain Inventory Average Pain 0 Pain Right Now 0 My pain is  no pain  BOWEL Number of stools per week: 12 Oral laxative use No   BLADDER Normal  Mobility use a cane use a walker ability to climb steps?  yes do you drive?  no Do you have any goals in this area?  yes  Function retired  Neuro/Psych trouble walking  Prior  Studies Any changes since last visit?  no  Physicians involved in your care Any changes since last visit?  no   Family History  Problem Relation Age of Onset   Heart attack Father    Diabetes Sister    Thyroid disease Daughter    Social History   Socioeconomic History   Marital status: Widowed    Spouse name: Not on file   Number of children: Not on file   Years of education: Not on file   Highest education level: Not on file  Occupational History   Not on file  Tobacco Use   Smoking status: Never   Smokeless tobacco: Never  Vaping Use   Vaping Use: Never used  Substance and Sexual Activity   Alcohol use: No   Drug use: No   Sexual activity: Not on file  Other Topics Concern   Not on file  Social History Narrative   Not on file   Social Determinants of Health   Financial Resource Strain: Not on file  Food Insecurity: Not on file  Transportation Needs: Not on file  Physical Activity: Not on file  Stress: Not on file  Social Connections: Not on file   Past Surgical History:  Procedure Laterality Date   ABDOMINAL HYSTERECTOMY     BACK SURGERY     GANGLION CYST EXCISION     Past Medical History:  Diagnosis Date   Gout    Seasonal allergies    Stroke (HCC)    BP (!) 152/89   Pulse 93  Ht 5\' 3"  (1.6 m)   Wt 193 lb 3.2 oz (87.6 kg)   SpO2 98%   BMI 34.22 kg/m   Opioid Risk Score:   Fall Risk Score:  `1  Depression screen PHQ 2/9  Depression screen [facility] 2/9 07/11/2021 07/05/2021 04/03/2021  Decreased Interest 0 0 0  Down, Depressed, Hopeless 0 0 0  PHQ - 2 Score 0 0 0  Altered sleeping - 0 0  Tired, decreased energy - 0 3  Change in appetite - 0 0  Feeling bad or failure about yourself  - 0 0  Trouble concentrating - 0 0  Moving slowly or fidgety/restless - 0 0  Suicidal thoughts - 0 0  PHQ-9 Score - 0 3     Review of Systems  Constitutional: Negative.   HENT: Negative.    Eyes: Negative.   Respiratory: Negative.    Cardiovascular:  Negative.   Gastrointestinal: Negative.   Endocrine: Negative.   Genitourinary: Negative.   Musculoskeletal:  Positive for gait problem.  Skin: Negative.   Allergic/Immunologic: Negative.   Hematological: Negative.   Psychiatric/Behavioral: Negative.    All other systems reviewed and are negative.     Objective:   Physical Exam Vitals reviewed.  Constitutional:      Appearance: She is obese.  Eyes:     Extraocular Movements: Extraocular movements intact.     Conjunctiva/sclera: Conjunctivae normal.     Pupils: Pupils are equal, round, and reactive to light.  Musculoskeletal:     Comments: No pain with upper extremity or lower extremity range of motion.  Negative straight leg raising bilaterally.  Skin:    General: Skin is warm and dry.  Neurological:     Mental Status: She is alert and oriented to person, place, and time.     Comments: Ambulates without a cane with close supervision, wide step support, forward flexed posture.  Small step length Motor strength is 3 - at the right deltoid, bicep, tricep, grip, hip flexor, knee extensor, ankle dorsiflexor and plantar flexor. Sensation intact light touch bilaterally in the upper and lower limbs. Speech without evidence of dysarthria or aphasia. No extraocular muscle weakness  Psychiatric:        Mood and Affect: Mood normal.        Behavior: Behavior normal.        Assessment & Plan:  1.  Left pontine infarct with right hemiparesis.  She has improved in terms of muscle grades since discharge from the hospital.  She continues to benefit from outpatient PT OT. She has realistic goal of ambulating without assistive device. I do not foresee her being able to return to work however. Physical medicine rehab follow-up in 2 months.

## 2021-07-13 ENCOUNTER — Ambulatory Visit: Payer: Medicare Other | Admitting: Occupational Therapy

## 2021-07-13 ENCOUNTER — Encounter: Payer: Self-pay | Admitting: Physical Therapy

## 2021-07-13 ENCOUNTER — Other Ambulatory Visit: Payer: Self-pay

## 2021-07-13 ENCOUNTER — Ambulatory Visit: Payer: Medicare Other | Admitting: Physical Therapy

## 2021-07-13 DIAGNOSIS — M6281 Muscle weakness (generalized): Secondary | ICD-10-CM

## 2021-07-13 DIAGNOSIS — I69353 Hemiplegia and hemiparesis 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: Secondary | ICD-10-CM

## 2021-07-13 DIAGNOSIS — R261 Paralytic gait: Secondary | ICD-10-CM

## 2021-07-13 DIAGNOSIS — I639 Cerebral infarction, unspecified: Secondary | ICD-10-CM

## 2021-07-13 DIAGNOSIS — R278 Other lack of coordination: Secondary | ICD-10-CM

## 2021-07-13 DIAGNOSIS — R2681 Unsteadiness on feet: Secondary | ICD-10-CM

## 2021-07-13 NOTE — Therapy (Signed)
[facility] [address]. [place], [place], [address] Phone: [phone]   Fax:  [phone]  Occupational Therapy Treatment  Patient Details  Name: Carrie Kline MRN: [id number] Date of Birth: 1946-06-04 Referring Provider (OT): Mariam Dollar, PA-C   Encounter Date: 07/13/2021   OT End of Session - 07/13/21 1107     Visit Number 22    Number of Visits 25   Initial 17 + 8 additional visits (2x/week)   Date for OT Re-Evaluation 07/13/21    Authorization Type BCBS Medicare (primary); Medicaid [place] (secondary)    Authorization Time Period VL: [place]    Progress Note Due on Visit 30    OT Start Time 1103    OT Stop Time 1145    OT Time Calculation (min) 42 min    Activity Tolerance Patient tolerated treatment well    Behavior During Therapy WFL for tasks assessed/performed            Past Medical History:  Diagnosis Date   Gout    Seasonal allergies    Stroke [facility])     Past Surgical History:  Procedure Laterality Date   ABDOMINAL HYSTERECTOMY     BACK SURGERY     GANGLION CYST EXCISION      There were no vitals filed for this visit.   Subjective Assessment - 07/13/21 1105     Subjective  Pt reports she "woke up too early this morning; I thought my appt was at 10"    Patient is accompanied by: Family member   daughter [name]   Pertinent History CVA w/ R-sided weakness on 02/24/21; Hx of gout and back surgery    Patient Stated Goals "Moving my R arm"    Currently in Pain? No/denies             Treatment/Exercises - 07/13/21    Manual Therapy OT performed gentle stretching/PROM of R wrist extension, composite finger flex/ext, and shoulder flexion while seated to increase ROM w/ additional benefit of decreased joint stiffness and potential increase in motor function. ROM most limited w/ finger flex/ext and shoulder flexion, but able to achieve wrist and hand WFL and approx 100 degrees of shoulder flexion. Pt  tolerated exercises w/out discomfort.    Overhead Reaching Facilitated functional overhead reach w/ RUE, retrieving standard drinking cup from countertop height to place on a shelf slightly above head height before returning back to countertop. Completed 10x slowly w/ OT providing mod verbal and tactile cues w/ additional support/facilitation at trunk and shoulders to decrease lateral trunk flexion to contralateral side and R arm abduction. Positioning and alignment improved w/ repetition.    AAROM: Shoulder Flexion Closed-chain AAROM of shoulder flexion w/ cane to facilitate functional reach, increased AROM, and strengthening w/ additional somatosensory input for neuro reed. Completed in sitting w/ pt requiring multimodal (tactile/verbal) cues to decrease compensatory movements at trunk and/or shoulder. Tall mirror positioned in front of pt during second set of 10 reps as visual aid to facilitate increased self-correction of compensatory movements w/ positive results.            OT Short Term Goals - 06/15/21 1023       OT SHORT TERM GOAL #1   Title Pt will improve independence w/ functional FM tasks (e.g., clothing manipulatives) as evidenced by being able to complete 9HPT w/ R hand    Baseline Completed 4 pegs in >1 min w R hand    Time 4  Period Weeks    Status Achieved   04/20/21 - 1 min, 23 sec   Target Date 04/28/21      OT SHORT TERM GOAL #2   Title Pt will improve AROM of R shoulder scaption by at least 20 degrees to facilitate improved functional reach   Revised goal from achieving shoulder flexion to scaption due to focus on function   Baseline R shoulder scaption 28 degrees    Time 4    Period Weeks    Status Achieved   06/06/21 - 96* of scaption w/ difficulty attaining true flexion beyond chest height; 05/02/21 - 52* of scaption     OT SHORT TERM GOAL #3   Title Pt will improve AROM of R elbow extension by at least 15 degrees to facilitate improved functional reach     Baseline Achieves 52 degrees of flexion when attempting elbow extension    Time 4    Period Weeks    Status Achieved   04/18/21 - 21* of flex w/ elbow ext     OT SHORT TERM GOAL #4   Title Pt will increase R hand grip strength by at least 4 lbs to improve independence w/ IADLs    Baseline 04/25/21 - RUE 4# (LUE 39#)    Time 4    Period Weeks    Status On-going   06/06/21 - 6# w/ RUE            OT Long Term Goals - 06/15/21 1024       OT LONG TERM GOAL #1   Title Pt will be able to cut food w/ Mod I, using AE/compensatory strategies prn, 100% of the time    Baseline Frequently requires assist cutting food    Time 8    Period Weeks    Status Achieved   05/04/21     OT LONG TERM GOAL #2   Title Pt will be able to fasten/unfasten clothing manipulatives in 5/5 trials w/ Mod I, using AE prn    Baseline Requires assist w/ fasteners    Time 8    Period Weeks    Status Achieved   05/04/21     OT LONG TERM GOAL #3   Title Pt will demonstrate simulated tub/shower transfer safely w/ Mod I    Baseline Min A w/ tub/shower transfer    Time 8    Period Weeks    Status Achieved   06/13/21 - per pt report     OT LONG TERM GOAL #4   Title Pt will demonstrate independence w/ HEP designed for coordination, ROM, strength, and functional use of RUE by d/c    Baseline No HEP at this time    Time 8    Period Weeks    Status On-going      OT LONG TERM GOAL #5   Title Pt will be able to safely place/retrieve medium-weight object (approx 5 lbs) at an overhead height w/ BUEs    Baseline Decreased RUE ROM/strength impacting functional reach    Time 4    Period Weeks    Status On-going    Target Date 07/13/21      OT LONG TERM GOAL #6   Title Pt will demonstrate improved RUE GMC as evidenced by increasing Box and Blocks score to at least 52 blocks    Baseline LUE 68; RUE 47    Time 4    Period Weeks    Status On-going    Target Date  07/13/21             Plan - 07/13/21 1108      Clinical Impression Statement Pt is progressing well toward goals. OT continued to facilitate stretching of R wrist and hand to decrease stiffness and improve functional grasp and release. Due to pt's report of recently being able to retrieve coffee mug from overhead height, but demonstrating compensatory shoulder elevation, arm abduction, and contralateral trunk flexion during reach, OT addressed alignment, providing significant cues and facilitation to prevent habitual movements patterns that may potentially lead to increased shoulder pain. Pt able to complete 4/10 reps w/ good alignment and shoulder ROM. Considering success observed during this activity, OT facilitated closed-chain AAROM w/ cane while seated and manual stretching of R shoulder flexion, achieving good ROM w/ appropriate alignment in both exercises.    OT Occupational Profile and History Detailed Assessment- Review of Records and additional review of physical, cognitive, psychosocial history related to current functional performance    Occupational performance deficits (Please refer to evaluation for details): ADL's;IADL's;Work;Leisure;Social Participation    Body Structure / Function / Physical Skills ADL;ROM;UE functional use;Mobility;FMC;Decreased knowledge of use of DME;Balance;Body mechanics;Dexterity;Gait;Vision;GMC;Strength;Endurance;Coordination;IADL;Tone    Rehab Potential Good    Clinical Decision Making Several treatment options, min-mod task modification necessary    Comorbidities Affecting Occupational Performance: May have comorbidities impacting occupational performance    Modification or Assistance to Complete Evaluation  Min-Moderate modification of tasks or assist with assess necessary to complete eval    OT Frequency 2x / week    OT Duration 8 weeks    OT Treatment/Interventions Self-care/ADL training;Electrical Stimulation;Therapeutic exercise;Visual/perceptual remediation/compensation;Patient/family  education;Splinting;Neuromuscular education;Moist Heat;Aquatic Therapy;Biofeedback;Energy conservation;Building services engineer;Therapeutic activities;Balance training;Passive range of motion;Manual Therapy;DME and/or AE instruction;Ultrasound;Cryotherapy    Plan Continue closed-chain R shoulder AAROM/PROM; weight-bearing exercises through R hand for neuro reed/prop input / NMES for elbow extension and functional reach?    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. [place], [place], [address] Phone: [phone]   Fax:  [phone]  Name: Carrie Kline MRN: [id number] Date of Birth: [birth year]

## 2021-07-13 NOTE — Therapy (Signed)
[place]. [place], [place], [address] Phone: [phone]   Fax:  [phone]  Physical Therapy Treatment  Patient Details  Name: Carrie Kline MRN: [id number] Date of Birth: February 26, 1946 Referring Provider (PT): Cathlyn Parsons, PA-C   Encounter Date: 07/13/2021   PT End of Session - 07/13/21 1235     Visit Number 22    Number of Visits 26    Date for PT Re-Evaluation 07/26/21    Authorization Type BCBS and Medicaid    PT Start Time 7782    PT Stop Time 1228    PT Time Calculation (min) 43 min    Activity Tolerance Patient tolerated treatment well;Patient limited by fatigue    Behavior During Therapy [facility] for tasks assessed/performed             Past Medical History:  Diagnosis Date   Gout    Seasonal allergies    Stroke [facility])     Past Surgical History:  Procedure Laterality Date   ABDOMINAL HYSTERECTOMY     BACK SURGERY     GANGLION CYST EXCISION      There were no vitals filed for this visit.   Subjective Assessment - 07/13/21 1152     Subjective Patient saw Dr Letta Pate, who feels she would benefit from c0ntinuing PT to address gait wihtout an AD.    Patient is accompained by: Family member    Pertinent History gout, back surgery    Limitations Walking;House hold activities;Lifting;Standing    How long can you stand comfortably? ~3 min    How long can you walk comfortably? 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 CT angio of the carotid showed moderate left vertebral artery stenosis, and mild to moderate distal PCA stenosis    Patient Stated Goals be able to walk without AD    Pain Onset In the past 7 days   Still sore from flu shot last Wed; states it is improving                              [facility] Adult PT Treatment/Exercise - 07/13/21 0001       Ambulation/Gait   Ambulation/Gait Yes     Ambulation/Gait Assistance 5: Supervision    Ambulation Distance (Feet) 20 Feet    Assistive device None    Gait Pattern Decreased stance time - right    Ambulation Surface Level      Knee/Hip Exercises: Aerobic   Recumbent Bike 1.5 x 2 min, 2 x 30 seconds at 2.5 to elicit coordinated co contractions in RLE, 2 x 30 sec at 1.2 for speed of movement.      Knee/Hip Exercises: Standing   Forward Step Up Both;1 set;10 reps;Step Height: 6"    Forward Step Up Limitations SPC support on L.    Other Standing Knee Exercises side to side steps, increase speed for balance. 10 reps    Other Standing Knee Exercises B side lunges, emphasize push off to engage co contraction in RLE. 10 reps                     PT Education - 07/13/21 1234     Education Details Updated HEP and purpose of each exercise.    Person(s) Educated Patient    Methods Explanation;Demonstration;Handout    Comprehension  Returned demonstration;Verbalized understanding              PT Short Term Goals - 07/13/21 1156       PT SHORT TERM GOAL #1   Period Weeks      PT SHORT TERM GOAL #2   Title Patient will be I with updated HEP    Baseline Initiated new exercises.    Time 1    Period Weeks    Status On-going    Target Date 07/21/21               PT Long Term Goals - 07/11/21 1151       PT LONG TERM GOAL #1   Title Patient to be independent with advanced HEP.    Status Partially Met      PT LONG TERM GOAL #2   Title Patient to demonstrate R LE strength >/=4/5.    Status On-going      PT LONG TERM GOAL #3   Title Patient to complete TUG in <14 sec with LRAD in order to decrease risk of falls.    Status On-going                   Plan - 07/13/21 1235     Clinical Impression Statement Patient saw Dr Letta Pate who recommends continuing PT to promote I ambulation without AD. Treatment focused on RLE strength, coordinated motor control, speed of meovement, to improve balance  reactions and RLE control in both stance and swing. Patient verbalized agreement to overall POC.    Personal Factors and Comorbidities Comorbidity 2;Age;Time since onset of injury/illness/exacerbation;Past/Current Experience    Comorbidities gout, back surgery    Examination-Activity Limitations Bathing;Transfers;Locomotion Level;Bed Mobility;Bend;Sit;Caring for Others;Carry;Squat;Stairs;Stand;Toileting;Lift;Hygiene/Grooming;Dressing    Examination-Participation Restrictions Church;Meal Prep;Cleaning;Community Activity;Shop;Laundry    Stability/Clinical Decision Making Evolving/Moderate complexity    Clinical Decision Making Moderate    Rehab Potential Good    PT Frequency 2x / week    PT Duration Other (comment)    PT Treatment/Interventions ADLs/Self Care Home Management;Cryotherapy;DME Instruction;Electrical Stimulation;Ultrasound;Moist Heat;Gait training;Stair training;Functional mobility training;Therapeutic activities;Therapeutic exercise;Balance training;Neuromuscular re-education;Patient/family education;Orthotic Fit/Training;Manual techniques;Splinting;Energy conservation;Dry needling;Passive range of motion;Taping;Vasopneumatic Device;Visual/perceptual remediation/compensation    PT Next Visit Plan [place]    Consulted and Agree with Plan of Care Patient             Patient will benefit from skilled therapeutic intervention in order to improve the following deficits and impairments:  Abnormal gait, Decreased coordination, Decreased range of motion, Difficulty walking, Increased fascial restricitons, Increased muscle spasms, Decreased safety awareness, Decreased activity tolerance, Improper body mechanics, Impaired flexibility, Decreased balance, Postural dysfunction, Decreased strength, Decreased mobility  Visit Diagnosis: 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)  Other lack of  coordination  Muscle weakness (generalized)  Unsteadiness on feet  Paralytic gait  Acute cerebral infarction [facility])     Problem List Patient Active Problem List   Diagnosis Date Noted   Spastic hemiplegia of right dominant side as late effect of cerebral infarction ([place]) 07/11/2021   Other hyperlipidemia 07/06/2021   Brainstem infarct, acute ([place]) 03/01/2021   Acute cerebral infarction ([place]) 02/24/2021   Thyroid nodule 02/24/2021   Diabetes ([place]) 02/24/2021    Marcelina Morel, DPT 07/13/2021, 12:39 PM  [place]. [place], [place], [address] Phone: [phone]   Fax:  [phone]  Name: Carrie Kline MRN: [id number] Date of Birth:  11/10/1945    

## 2021-07-13 NOTE — Patient Instructions (Signed)
Access Code: [email or web address] URL: [email or web address] Date: 07/13/2021 Prepared by: Oley Balm  Exercises Standing March with Unilateral Counter Support - 1 x daily - 7 x weekly - 2 sets - 10 reps Side Stepping with Unilateral Counter Support - 1 x daily - 7 x weekly - 2 sets - 10 reps

## 2021-07-14 NOTE — Therapy (Signed)
[facility] [address] [place], [place], [address] Phone: [phone]   Fax:  [phone]  Occupational Therapy Treatment  Patient Details  Name: Carrie Kline MRN: [id number] Date of Birth: [birth year] Referring Provider (OT): Mariam Dollar, PA-C   Encounter Date: 07/11/2021   OT End of Session - 07/11/21 1057     Visit Number 21   Number of Visits 25   Initial 17 + 8 additional visits (2x/week)   Date for OT Re-Evaluation 07/13/21    Authorization Type BCBS Medicare (primary); Medicaid [place] (secondary)    Authorization Time Period VL: [place]    Progress Note Due on Visit 30    OT Start Time 1055    OT Stop Time 1137   OT Time Calculation (min) 42 min    Activity Tolerance Patient tolerated treatment well    Behavior During Therapy WFL for tasks assessed/performed            Past Medical History:  Diagnosis Date   Gout    Seasonal allergies    Stroke [facility])     Past Surgical History:  Procedure Laterality Date   ABDOMINAL HYSTERECTOMY     BACK SURGERY     GANGLION CYST EXCISION      There were no vitals filed for this visit.   Subjective Assessment - 07/11/21 1055     Subjective  "I'll do it"   Patient is accompanied by: Family member   [name]   Pertinent History CVA w/ R-sided weakness on 02/24/21; Hx of gout and back surgery    Patient Stated Goals "Moving my R arm"    Currently in Pain? Yes   Pain Score 2   Pain Location Arm   Pain Orientation Left   Pain Descriptors / Indicators Sore   Pain Type Acute pain   Pain Onset In the past 7 days   Still sore from flu shot last Wed; states it is improving            Treatment/Exercises - 07/11/21    Grip Strengthening Picking up 1" blocks w/ power grip exerciser using R hand and placing in a container to facilitate neuro reed of functional grasp/release, intrinsic hand strengthening, and to provide input to R hand; pt completed activity w/  multiple drops and required min assist w/ stabilization of power gripper for success    Shoulder ROM Attempted modified towel slides using a mousepad on wall w/ LUE to facilitate alignment/positioning w/ overhead reach and increased stretch/AROM; OT provided mod tactile facilitation/support at shoulder to decrease compensatory shoulder elevation and improve shoulder alignment during exercise. Completed exercise in standing to incorporate static standing balance  Activity was modified due to difficulty decreasing compensatory patterns and maintaining hand positioning down to modified towel slides on tabletop using mousepad. Pt able to complete activity w/ good positioning and min compensatory patterns; OT provided tactile/verbal cues for alignment and incorporated tall mirror as visual cue to facilitate self-correction of positioning w/ positive results    Wrist ROM OT performed gentle stretching of R wrist extension, holding stretch at end-range, to increase ROM w/ additional benefit of decreased joint stiffness and potential increase in motor function; able to achieve extension w/out discomfort WFL. PROM slightly limited by increased tone, which improved w/ repetition. Reviewed self-stretch exercises included in HEP.            OT Education - 07/11/21 1150    Education Details Continued CVA condition-specific education  Person(s) Educated Patient   Methods Explanation   Comprehension Verbalized understanding            OT Short Term Goals - 06/15/21 1023       OT SHORT TERM GOAL #1   Title Pt will improve independence w/ functional FM tasks (e.g., clothing manipulatives) as evidenced by being able to complete 9HPT w/ R hand    Baseline Completed 4 pegs in >1 min w R hand    Time 4    Period Weeks    Status Achieved   04/20/21 - 1 min, 23 sec   Target Date 04/28/21      OT SHORT TERM GOAL #2   Title Pt will improve AROM of R shoulder scaption by at least 20 degrees to facilitate  improved functional reach   Revised goal from achieving shoulder flexion to scaption due to focus on function   Baseline R shoulder scaption 28 degrees    Time 4    Period Weeks    Status Achieved   06/06/21 - 96* of scaption w/ difficulty attaining true flexion beyond chest height; 05/02/21 - 52* of scaption     OT SHORT TERM GOAL #3   Title Pt will improve AROM of R elbow extension by at least 15 degrees to facilitate improved functional reach    Baseline Achieves 52 degrees of flexion when attempting elbow extension    Time 4    Period Weeks    Status Achieved   04/18/21 - 21* of flex w/ elbow ext     OT SHORT TERM GOAL #4   Title Pt will increase R hand grip strength by at least 4 lbs to improve independence w/ IADLs    Baseline 04/25/21 - RUE 4# (LUE 39#)    Time 4    Period Weeks    Status On-going   06/06/21 - 6# w/ RUE            OT Long Term Goals - 06/15/21 1024       OT LONG TERM GOAL #1   Title Pt will be able to cut food w/ Mod I, using AE/compensatory strategies prn, 100% of the time    Baseline Frequently requires assist cutting food    Time 8    Period Weeks    Status Achieved   05/04/21     OT LONG TERM GOAL #2   Title Pt will be able to fasten/unfasten clothing manipulatives in 5/5 trials w/ Mod I, using AE prn    Baseline Requires assist w/ fasteners    Time 8    Period Weeks    Status Achieved   05/04/21     OT LONG TERM GOAL #3   Title Pt will demonstrate simulated tub/shower transfer safely w/ Mod I    Baseline Min A w/ tub/shower transfer    Time 8    Period Weeks    Status Achieved   06/13/21 - per pt report     OT LONG TERM GOAL #4   Title Pt will demonstrate independence w/ HEP designed for coordination, ROM, strength, and functional use of RUE by d/c    Baseline No HEP at this time    Time 8    Period Weeks    Status On-going      OT LONG TERM GOAL #5   Title Pt will be able to safely place/retrieve medium-weight object (approx 5 lbs) at an  overhead height w/ BUEs  Baseline Decreased RUE ROM/strength impacting functional reach    Time 4    Period Weeks    Status On-going    Target Date 07/13/21      OT LONG TERM GOAL #6   Title Pt will demonstrate improved RUE GMC as evidenced by increasing Box and Blocks score to at least 52 blocks    Baseline LUE 68; RUE 47    Time 4    Period Weeks    Status On-going    Target Date 07/13/21             Plan - 07/11/21 1101    Clinical Impression Statement Due to improvements observed w/ R hand control/coordination, OT facilitated neuro reed of functional grasp and release w/ power grip exerciser. Despite frequent drops, pt demonstrated increased grip considering ability to complete at all in previous attempt a few weeks ago. OT also continued to address decrease shoulder flexion, emphasizing alignment (e.g., wrist up, elbow in and forward); good carryover of learned technique for alignment within session. Pt continues to benefit from stretching/ROM of R shoulder and repetition to facilitate success.    OT Occupational Profile and History Detailed Assessment- Review of Records and additional review of physical, cognitive, psychosocial history related to current functional performance    Occupational performance deficits (Please refer to evaluation for details): ADL's;IADL's;Work;Leisure;Social Participation    Body Structure / Function / Physical Skills ADL;ROM;UE functional use;Mobility;FMC;Decreased knowledge of use of DME;Balance;Body mechanics;Dexterity;Gait;Vision;GMC;Strength;Endurance;Coordination;IADL;Tone    Rehab Potential Good    Clinical Decision Making Several treatment options, min-mod task modification necessary    Comorbidities Affecting Occupational Performance: May have comorbidities impacting occupational performance    Modification or Assistance to Complete Evaluation  Min-Moderate modification of tasks or assist with assess necessary to complete eval    OT  Frequency 2x / week    OT Duration 8 weeks    OT Treatment/Interventions Self-care/ADL training;Electrical Stimulation;Therapeutic exercise;Visual/perceptual remediation/compensation;Patient/family education;Splinting;Neuromuscular education;Moist Heat;Aquatic Therapy;Biofeedback;Energy conservation;Building services engineer;Therapeutic activities;Balance training;Passive range of motion;Manual Therapy;DME and/or AE instruction;Ultrasound;Cryotherapy    Plan Closed-chain R shoulder ROM/PROM; weight-bearing exercises through R hand for neuro reed/prop input; NMES for elbow extension and functional reach?   Consulted and Agree with Plan of Care Patient;Family member/caregiver    Family Member Consulted Kim (daughter)            Patient will benefit from skilled therapeutic intervention in order to improve the following deficits and impairments:   Body Structure / Function / Physical Skills: ADL, ROM, UE functional use, Mobility, FMC, Decreased knowledge of use of DME, Balance, Body mechanics, Dexterity, Gait, Vision, GMC, Strength, Endurance, Coordination, IADL, Tone   Visit Diagnosis: 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)  Other lack of coordination  Muscle weakness (generalized)  Unsteadiness on feet   Problem List Patient Active Problem List   Diagnosis Date Noted   Spastic hemiplegia of right dominant side as late effect of cerebral infarction (HCC) 07/11/2021   Other hyperlipidemia 07/06/2021   Brainstem infarct, acute (HCC) 03/01/2021   Acute cerebral infarction (HCC) 02/24/2021   Thyroid nodule 02/24/2021   Diabetes (HCC) 02/24/2021    Rosie Fate, OTR/L, MSOT  07/12/2021, 9:21 AM  [facility] [address] [place], [place], [address] Phone: [phone]   Fax:  [phone]  Name: Carrie Kline MRN: [id number] Date of Birth: 08/28/1945

## 2021-07-18 ENCOUNTER — Ambulatory Visit: Payer: Medicare Other | Admitting: Occupational Therapy

## 2021-07-18 ENCOUNTER — Other Ambulatory Visit: Payer: Self-pay

## 2021-07-18 ENCOUNTER — Ambulatory Visit: Payer: Medicare Other | Admitting: Physical Therapy

## 2021-07-18 ENCOUNTER — Encounter: Payer: Self-pay | Admitting: Occupational Therapy

## 2021-07-18 DIAGNOSIS — M6281 Muscle weakness (generalized): Secondary | ICD-10-CM

## 2021-07-18 DIAGNOSIS — R2681 Unsteadiness on feet: Secondary | ICD-10-CM

## 2021-07-18 DIAGNOSIS — R278 Other lack of coordination: Secondary | ICD-10-CM

## 2021-07-18 DIAGNOSIS — I639 Cerebral infarction, unspecified: Secondary | ICD-10-CM

## 2021-07-18 DIAGNOSIS — I69353 Hemiplegia and hemiparesis 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: Secondary | ICD-10-CM | POA: Diagnosis not present

## 2021-07-18 DIAGNOSIS — R261 Paralytic gait: Secondary | ICD-10-CM

## 2021-07-18 NOTE — Therapy (Signed)
[facility] [address] [place], [place], [address] Phone: [phone]   Fax:  [phone]  Occupational Therapy Treatment & Recertification  Patient Details  Name: Carrie Kline MRN: [id number] Date of Birth: 1946-06-20 Referring Provider (OT): Mariam Dollar, PA-C   Encounter Date: 07/18/2021   OT End of Session - 07/18/21 1452     Visit Number 23    Number of Visits 31   Initial 23 + 8 additional visits (2x/week)   Date for OT Re-Evaluation 08/17/21    Authorization Type BCBS Medicare (primary); Medicaid [place] (secondary)    Authorization Time Period VL: [place]    Progress Note Due on Visit 30    OT Start Time 1442    OT Stop Time 1512    OT Time Calculation (min) 30 min    Activity Tolerance Patient tolerated treatment well    Behavior During Therapy WFL for tasks assessed/performed            Past Medical History:  Diagnosis Date   Gout    Seasonal allergies    Stroke [facility])     Past Surgical History:  Procedure Laterality Date   ABDOMINAL HYSTERECTOMY     BACK SURGERY     GANGLION CYST EXCISION      There were no vitals filed for this visit.   Subjective Assessment - 07/18/21 1446     Subjective  Pt reports she has a headache, which makes her feel "anxious because of my stroke"    Pertinent History CVA w/ R-sided weakness on 02/24/21; Hx of gout and back surgery    Patient Stated Goals "Moving my R arm"    Currently in Pain? Yes    Pain Score 5     Pain Location Head   "sinus headache"   Pain Type Acute pain    Pain Onset Yesterday             OT Education - 07/18/21 1450     Education Details Continued CVA condition-specific education including common symptoms of stroke and secondary stroke prevention    Person(s) Educated Patient    Methods Explanation    Comprehension Verbalized understanding             Treatment/Exercises - 07/18/21    Hand Exercises Rolling weighted Heavymed ball  in-hand, relaxing hand to roll ball to fingertips and then flexing fingers to grasp again; completed 2 sets of 15 reps slowly w/ OT providing verbal cues and modeling for positioning and pacing  R hand composite finger abduction and adduction completed 15x slowly to facilitate intrinsic hand strengthening and neuro reeducation of functional FMC and grip. Pt required mod verbal/tactile cues and modeling to maintain open hand during exercise (e.g., not flexing at MPJs)    Shoulder ROM OT facilitated PROM of R shoulder flexion 5x slowly; max verbal cues for true passive movements and preventing compensatory movement patterns. Mild crepitus palpated w/ pt reporting no pain; able to achieve approx 90 degress of flexion  R shoulder flexion place-and hold exercises at chest height to facilitate muscle activation w/out compensatory scapular elevation; pt able to self-correct maladaptive movement patterns w/ verbal or tactile cueing            OT Short Term Goals - 06/15/21 1023       OT SHORT TERM GOAL #1   Title Pt will improve independence w/ functional FM tasks (e.g., clothing manipulatives) as evidenced by being able to complete 9HPT w/  R hand    Baseline Completed 4 pegs in >1 min w R hand    Time 4    Period Weeks    Status Achieved   04/20/21 - 1 min, 23 sec   Target Date 04/28/21      OT SHORT TERM GOAL #2   Title Pt will improve AROM of R shoulder scaption by at least 20 degrees to facilitate improved functional reach   Revised goal from achieving shoulder flexion to scaption due to focus on function   Baseline R shoulder scaption 28 degrees    Time 4    Period Weeks    Status Achieved   06/06/21 - 96* of scaption w/ difficulty attaining true flexion beyond chest height; 05/02/21 - 52* of scaption     OT SHORT TERM GOAL #3   Title Pt will improve AROM of R elbow extension by at least 15 degrees to facilitate improved functional reach    Baseline Achieves 52 degrees of flexion when  attempting elbow extension    Time 4    Period Weeks    Status Achieved   04/18/21 - 21* of flex w/ elbow ext     OT SHORT TERM GOAL #4   Title Pt will increase R hand grip strength by at least 4 lbs to improve independence w/ IADLs    Baseline 04/25/21 - RUE 4# (LUE 39#)    Time 4    Period Weeks    Status On-going   06/06/21 - 6# w/ RUE            OT Long Term Goals - 07/18/21 1530       OT LONG TERM GOAL #1   Title Pt will be able to cut food w/ Mod I, using AE/compensatory strategies prn, 100% of the time    Baseline Frequently requires assist cutting food    Time 8    Period Weeks    Status Achieved   05/04/21     OT LONG TERM GOAL #2   Title Pt will be able to fasten/unfasten clothing manipulatives in 5/5 trials w/ Mod I, using AE prn    Baseline Requires assist w/ fasteners    Time 8    Period Weeks    Status Achieved   05/04/21     OT LONG TERM GOAL #3   Title Pt will demonstrate simulated tub/shower transfer safely w/ Mod I    Baseline Min A w/ tub/shower transfer    Time 8    Period Weeks    Status Achieved   06/13/21 - per pt report     OT LONG TERM GOAL #4   Title Pt will demonstrate independence w/ HEP designed for coordination, ROM, strength, and functional use of RUE by d/c    Baseline No HEP at this time    Time 8    Period Weeks    Status Achieved   07/18/21 - reports consistent carryover of exercises     OT LONG TERM GOAL #5   Title Pt will be able to safely place/retrieve medium-weight object (approx 5 lbs) at an overhead height w/ BUEs    Baseline Decreased RUE ROM/strength impacting functional reach    Time 4    Period Weeks    Status On-going    Target Date 08/17/21      OT LONG TERM GOAL #6   Title Pt will demonstrate improved RUE GMC as evidenced by increasing Box and Blocks score to at  least 52 blocks    Baseline LUE 68; RUE 47    Time 4    Period Weeks    Status On-going    Target Date 08/17/21             Plan - 07/18/21 1454      Clinical Impression Statement Pt arrived to session w/ c/o sinus headache and fatigue, stating she did not want to miss her only appt this week but did not want to complete any activities that were too challenging. OT continued to facilitate neuro reeducation of RUE for improved functional reach and grasp and release, emphasizing functional movement patterns and consistent muscle activation w/out compensating. Pt participated well in exercises before requesting to conclude session early. Pt continues to demonstrate stiffness, particularly of her R shoulder, and reports stiffness in her R hand, both of which impact functional use of her R, non-dominant UE. Ms. Badalamenti will benefit from continued skilled OT services to facilitate neuro reeducation and improve participation and independence w/ functional activities.    OT Occupational Profile and History Detailed Assessment- Review of Records and additional review of physical, cognitive, psychosocial history related to current functional performance    Occupational performance deficits (Please refer to evaluation for details): ADL's;IADL's;Work;Leisure;Social Participation    Body Structure / Function / Physical Skills ADL;ROM;UE functional use;Mobility;FMC;Decreased knowledge of use of DME;Balance;Body mechanics;Dexterity;Gait;Vision;GMC;Strength;Endurance;Coordination;IADL;Tone    Rehab Potential Good    Clinical Decision Making Several treatment options, min-mod task modification necessary    Comorbidities Affecting Occupational Performance: May have comorbidities impacting occupational performance    Modification or Assistance to Complete Evaluation  Min-Moderate modification of tasks or assist with assess necessary to complete eval    OT Frequency 2x / week    OT Duration 8 weeks    OT Treatment/Interventions Self-care/ADL training;Electrical Stimulation;Therapeutic exercise;Visual/perceptual remediation/compensation;Patient/family  education;Splinting;Neuromuscular education;Moist Heat;Aquatic Therapy;Biofeedback;Energy conservation;Building services engineer;Therapeutic activities;Balance training;Passive range of motion;Manual Therapy;DME and/or AE instruction;Ultrasound;Cryotherapy    Plan Continue closed-chain R shoulder AAROM/PROM; weight-bearing exercises through R hand for neuro reed/prop input / NMES for elbow extension and functional reach?    Consulted and Agree with Plan of Care Patient;Family member/caregiver    Family Member Consulted Kim (daughter)            Patient will benefit from skilled therapeutic intervention in order to improve the following deficits and impairments:   Body Structure / Function / Physical Skills: ADL, ROM, UE functional use, Mobility, FMC, Decreased knowledge of use of DME, Balance, Body mechanics, Dexterity, Gait, Vision, GMC, Strength, Endurance, Coordination, IADL, Tone   Visit Diagnosis: 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)  Other lack of coordination  Muscle weakness (generalized)  Unsteadiness on feet   Problem List Patient Active Problem List   Diagnosis Date Noted   Spastic hemiplegia of right dominant side as late effect of cerebral infarction (HCC) 07/11/2021   Other hyperlipidemia 07/06/2021   Brainstem infarct, acute (HCC) 03/01/2021   Acute cerebral infarction (HCC) 02/24/2021   Thyroid nodule 02/24/2021   Diabetes (HCC) 02/24/2021    Rosie Fate, OTR/L, MSOT 07/18/2021, 3:04 PM  [facility] [address] [place], [place], [address] Phone: [phone]   Fax:  [phone]  Name: Carrie Kline MRN: [id number] Date of Birth: 10-24-1945

## 2021-07-18 NOTE — Therapy (Addendum)
[place]. [place], [place], [address] Phone: [phone]   Fax:  [phone]  Physical Therapy Treatment  Patient Details  Name: Carrie Kline MRN: [id number] Date of Birth: July 22, 1946 Referring Provider (PT): Cathlyn Parsons, PA-C   Encounter Date: 07/18/2021   PT End of Session - 07/18/21 1400     Visit Number 23    Number of Visits 26    Date for PT Re-Evaluation 07/26/21    Authorization Type BCBS and Medicaid    PT Start Time 1400    PT Stop Time 1440    PT Time Calculation (min) 40 min    Activity Tolerance Patient tolerated treatment well;Patient limited by fatigue    Behavior During Therapy [facility] for tasks assessed/performed             Past Medical History:  Diagnosis Date   Gout    Seasonal allergies    Stroke [facility])     Past Surgical History:  Procedure Laterality Date   ABDOMINAL HYSTERECTOMY     BACK SURGERY     GANGLION CYST EXCISION      There were no vitals filed for this visit.   Subjective Assessment - 07/18/21 1402     Subjective Pt reports her head hurts from her sinuses.    Patient is accompained by: Family member    Pertinent History gout, back surgery    Limitations Walking;House hold activities;Lifting;Standing    How long can you stand comfortably? ~3 min    How long can you walk comfortably? 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 Yes    Pain Score 5     Pain Location Head    Pain Orientation Left    Pain Descriptors / Indicators Sore    Pain Type Acute pain    Pain Onset In the past 7 days   Still sore from flu shot last Wed; states it is improving                              [facility]  Adult PT Treatment/Exercise - 07/18/21 0001       Ambulation/Gait   Gait Comments in // bars: forward stepping cone pick ups x2 trips; side stepping cone pick ups x2 trips; in // bars working on increased trunk extension, R stance time and amb with no a/d x 2 trips      Lumbar Exercises: Aerobic   Nustep L3 to L5 x 10 min      Knee/Hip Exercises: Standing   Lateral Step Up Both;2 sets;10 reps   Lateral step tap   Forward Step Up Both;10 reps;2 sets;Step Height: 4"   forward step tap                      PT Short Term Goals - 07/13/21 1156       PT SHORT TERM GOAL #1   Period Weeks      PT SHORT TERM GOAL #2   Title Patient will be I with updated HEP    Baseline Initiated new exercises.    Time 1  Period Weeks    Status On-going    Target Date 07/21/21               PT Long Term Goals - 07/11/21 1151       PT LONG TERM GOAL #1   Title Patient to be independent with advanced HEP.    Status Partially Met      PT LONG TERM GOAL #2   Title Patient to demonstrate R LE strength >/=4/5.    Status On-going      PT LONG TERM GOAL #3   Title Patient to complete TUG in <14 sec with LRAD in order to decrease risk of falls.    Status On-going                   Plan - 07/18/21 1419     Clinical Impression Statement Pt limited due to increased fatigue and headache this session from her sinuses. Continued to work on dynamic gait, balance and R LE strength. Worked on gait without a/d with tactile and verbal cueing to decrease compensation    Personal Factors and Comorbidities Comorbidity 2;Age;Time since onset of injury/illness/exacerbation;Past/Current Experience    Comorbidities gout, back surgery    Examination-Activity Limitations Bathing;Transfers;Locomotion Level;Bed Mobility;Bend;Sit;Caring for Others;Carry;Squat;Stairs;Stand;Toileting;Lift;Hygiene/Grooming;Dressing    Examination-Participation Restrictions Church;Meal Prep;Cleaning;Community  Activity;Shop;Laundry    Stability/Clinical Decision Making Evolving/Moderate complexity    Rehab Potential Good    PT Frequency 2x / week    PT Duration Other (comment)    PT Treatment/Interventions ADLs/Self Care Home Management;Cryotherapy;DME Instruction;Electrical Stimulation;Ultrasound;Moist Heat;Gait training;Stair training;Functional mobility training;Therapeutic activities;Therapeutic exercise;Balance training;Neuromuscular re-education;Patient/family education;Orthotic Fit/Training;Manual techniques;Splinting;Energy conservation;Dry needling;Passive range of motion;Taping;Vasopneumatic Device;Visual/perceptual remediation/compensation    PT Next Visit Plan Continue strength, balance, and gait without a/d. Work on stepping strategies and increasing R LE stance time.    Consulted and Agree with Plan of Care Patient             Patient will benefit from skilled therapeutic intervention in order to improve the following deficits and impairments:  Abnormal gait, Decreased coordination, Decreased range of motion, Difficulty walking, Increased fascial restricitons, Increased muscle spasms, Decreased safety awareness, Decreased activity tolerance, Improper body mechanics, Impaired flexibility, Decreased balance, Postural dysfunction, Decreased strength, Decreased mobility  Visit Diagnosis: 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)  Other lack of coordination  Muscle weakness (generalized)  Unsteadiness on feet  Paralytic gait  Acute cerebral infarction [facility])     Problem List Patient Active Problem List   Diagnosis Date Noted   Spastic hemiplegia of right dominant side as late effect of cerebral infarction ([place]) 07/11/2021   Other hyperlipidemia 07/06/2021   Brainstem infarct, acute ([place]) 03/01/2021   Acute cerebral infarction [facility]) 02/24/2021   Thyroid nodule 02/24/2021   Diabetes ([place]) 02/24/2021    Tasmia Blumer April Gordy Levan, PT, DPT 07/18/2021, 2:37 PM  [place]. [place], [place], [address] Phone: [phone]   Fax:  [phone]  Name: Carrie Kline MRN: [id number] Date of Birth: 1946-03-23

## 2021-07-25 ENCOUNTER — Encounter: Payer: Self-pay | Admitting: Physical Therapy

## 2021-07-25 ENCOUNTER — Encounter: Payer: Self-pay | Admitting: Occupational Therapy

## 2021-07-25 ENCOUNTER — Ambulatory Visit: Payer: Medicare Other | Admitting: Occupational Therapy

## 2021-07-25 ENCOUNTER — Ambulatory Visit: Payer: Medicare Other | Admitting: Physical Therapy

## 2021-07-25 ENCOUNTER — Other Ambulatory Visit: Payer: Self-pay

## 2021-07-25 DIAGNOSIS — M6281 Muscle weakness (generalized): Secondary | ICD-10-CM

## 2021-07-25 DIAGNOSIS — R278 Other lack of coordination: Secondary | ICD-10-CM

## 2021-07-25 DIAGNOSIS — I69353 Hemiplegia and hemiparesis 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: Secondary | ICD-10-CM

## 2021-07-25 DIAGNOSIS — R261 Paralytic gait: Secondary | ICD-10-CM

## 2021-07-25 DIAGNOSIS — R2681 Unsteadiness on feet: Secondary | ICD-10-CM

## 2021-07-25 NOTE — Patient Instructions (Signed)
Elbow Extension - Back (Triceps Strength)    Using ~2 lb weights, sit leaning slightly forward, elbow slightly bent. Breathe in. Slowly straighten arm back, breathing out as you do. Return slowly, breathing in. Repeat 10 times per session. Do 2 sessions per day. Variation: Do without weights.  OR  ELBOW: Extension (Ball)    Hold weighted ball with both hands; straighten elbows. Hold 5 seconds. Use 2 lb ball/weight. 10 reps per set, 2 sets per day.   Copyright  VHI. All rights reserved.

## 2021-07-25 NOTE — Therapy (Signed)
[facility] [address] [place], [place], [address] Phone: [phone]   Fax:  [phone]  Occupational Therapy Treatment  Patient Details  Name: Carrie Kline MRN: [id number] Date of Birth: [birth year] Referring Provider (OT): Mariam Dollar, PA-C   Encounter Date: 07/25/2021   OT End of Session - 07/25/21 1452     Visit Number 24    Number of Visits 31   Initial 23 + 8 additional visits (2x/week)   Date for OT Re-Evaluation 08/17/21    Authorization Type BCBS Medicare (primary); Medicaid [place] (secondary)    Authorization Time Period VL: [place]    Progress Note Due on Visit 30    OT Start Time 1435    OT Stop Time 1520    OT Time Calculation (min) 45 min    Activity Tolerance Patient tolerated treatment well    Behavior During Therapy WFL for tasks assessed/performed            Past Medical History:  Diagnosis Date   Gout    Seasonal allergies    Stroke [facility])     Past Surgical History:  Procedure Laterality Date   ABDOMINAL HYSTERECTOMY     BACK SURGERY     GANGLION CYST EXCISION      There were no vitals filed for this visit.   Subjective Assessment - 07/25/21 1439     Subjective  Pt reports feeling like she is hyper-aware of any changes of new body sensations since having her stroke    Pertinent History CVA w/ R-sided weakness on 02/24/21; Hx of gout and back surgery    Patient Stated Goals "Moving my R arm"    Currently in Pain? No/denies    Pain Onset --             Treatment/Exercises - 07/25/21    UE Ranger Closed-chain AAROM w/ UE Ranger to facilitate functional forward reach, equal activation, scapular stabilization, and light strengthening w/ additional somatosensory input for increased proprioception and kinesthetic sense of RUE. Completed 15x slowly at approximately 90 degrees of flexion. OT provided mod tactile cue to prevent compensatory shoulder elevation and multimodal cues (verbal/tactile)  to emphasize elbow extension each rep    Elbow Extension AROM of R elbow extension completed 2x10 w/ 2 lb dumbbell for increased somatosensory input. Pt positioned w/ slight forward trunk flexion to maximize extension; unable to achieve full extension, but fair muscle contraction palpated. OT provided additional verbal and tactile cues prn for positioning and sequencing of exercise            OT Education - 07/25/21 1452     Education Details Continued CVA condition-specific education, particularly regarding recognition of signs of stroke, stroke risk factors, and secondary stroke prevention    Person(s) Educated Patient    Methods Explanation    Comprehension Verbalized understanding             OT Short Term Goals - 06/15/21 1023       OT SHORT TERM GOAL #1   Title Pt will improve independence w/ functional FM tasks (e.g., clothing manipulatives) as evidenced by being able to complete 9HPT w/ R hand    Baseline Completed 4 pegs in >1 min w R hand    Time 4    Period Weeks    Status Achieved   04/20/21 - 1 min, 23 sec   Target Date 04/28/21      OT SHORT TERM GOAL #2   Title  Pt will improve AROM of R shoulder scaption by at least 20 degrees to facilitate improved functional reach   Revised goal from achieving shoulder flexion to scaption due to focus on function   Baseline R shoulder scaption 28 degrees    Time 4    Period Weeks    Status Achieved   06/06/21 - 96* of scaption w/ difficulty attaining true flexion beyond chest height; 05/02/21 - 52* of scaption     OT SHORT TERM GOAL #3   Title Pt will improve AROM of R elbow extension by at least 15 degrees to facilitate improved functional reach    Baseline Achieves 52 degrees of flexion when attempting elbow extension    Time 4    Period Weeks    Status Achieved   04/18/21 - 21* of flex w/ elbow ext     OT SHORT TERM GOAL #4   Title Pt will increase R hand grip strength by at least 4 lbs to improve independence w/ IADLs     Baseline 04/25/21 - RUE 4# (LUE 39#)    Time 4    Period Weeks    Status On-going   06/06/21 - 6# w/ RUE            OT Long Term Goals - 07/18/21 1530       OT LONG TERM GOAL #1   Title Pt will be able to cut food w/ Mod I, using AE/compensatory strategies prn, 100% of the time    Baseline Frequently requires assist cutting food    Time 8    Period Weeks    Status Achieved   05/04/21     OT LONG TERM GOAL #2   Title Pt will be able to fasten/unfasten clothing manipulatives in 5/5 trials w/ Mod I, using AE prn    Baseline Requires assist w/ fasteners    Time 8    Period Weeks    Status Achieved   05/04/21     OT LONG TERM GOAL #3   Title Pt will demonstrate simulated tub/shower transfer safely w/ Mod I    Baseline Min A w/ tub/shower transfer    Time 8    Period Weeks    Status Achieved   06/13/21 - per pt report     OT LONG TERM GOAL #4   Title Pt will demonstrate independence w/ HEP designed for coordination, ROM, strength, and functional use of RUE by d/c    Baseline No HEP at this time    Time 8    Period Weeks    Status Achieved   07/18/21 - reports consistent carryover of exercises     OT LONG TERM GOAL #5   Title Pt will be able to safely place/retrieve medium-weight object (approx 5 lbs) at an overhead height w/ BUEs    Baseline Decreased RUE ROM/strength impacting functional reach    Time 4    Period Weeks    Status On-going    Target Date 08/17/21      OT LONG TERM GOAL #6   Title Pt will demonstrate improved RUE GMC as evidenced by increasing Box and Blocks score to at least 52 blocks    Baseline LUE 68; RUE 47    Time 4    Period Weeks    Status On-going    Target Date 08/17/21             Plan - 07/25/21 1454     Clinical Impression Statement Pt  arrived to session today reporting persistent feelings of anxiety regarding secondary stroke risk and understanding why she experienced a stroke. Pt appeared concerned that she did not do enough to  prevent her stroke from occurring/progressing and OT provided support w/ relevant condition-specific education regarding stroke risk factors and recommended methods to help w/ prevention. Pt's questions were answered as effectively as possible within OT scope of practice and OT further encouraged on the likely benefit of discussing her feelings w/ her counselor if she feels this is appropriate. OT also continued to facilitate RUE neuro reeducation, focusing on ROM and muscle activation for funtional forward and overhead reach w/out compensatory patterns.    OT Occupational Profile and History Detailed Assessment- Review of Records and additional review of physical, cognitive, psychosocial history related to current functional performance    Occupational performance deficits (Please refer to evaluation for details): ADL's;IADL's;Work;Leisure;Social Participation    Body Structure / Function / Physical Skills ADL;ROM;UE functional use;Mobility;FMC;Decreased knowledge of use of DME;Balance;Body mechanics;Dexterity;Gait;Vision;GMC;Strength;Endurance;Coordination;IADL;Tone    Rehab Potential Good    Clinical Decision Making Several treatment options, min-mod task modification necessary    Comorbidities Affecting Occupational Performance: May have comorbidities impacting occupational performance    Modification or Assistance to Complete Evaluation  Min-Moderate modification of tasks or assist with assess necessary to complete eval    OT Frequency 2x / week    OT Duration 8 weeks    OT Treatment/Interventions Self-care/ADL training;Electrical Stimulation;Therapeutic exercise;Visual/perceptual remediation/compensation;Patient/family education;Splinting;Neuromuscular education;Moist Heat;Aquatic Therapy;Biofeedback;Energy conservation;Building services engineer;Therapeutic activities;Balance training;Passive range of motion;Manual Therapy;DME and/or AE instruction;Ultrasound;Cryotherapy    Plan Continue  closed-chain R shoulder AAROM/PROM; weight-bearing exercises through R hand for neuro reed/prop input / NMES for elbow extension and functional reach?    Consulted and Agree with Plan of Care Patient;Family member/caregiver    Family Member Consulted Kim (daughter)            Patient will benefit from skilled therapeutic intervention in order to improve the following deficits and impairments:   Body Structure / Function / Physical Skills: ADL, ROM, UE functional use, Mobility, FMC, Decreased knowledge of use of DME, Balance, Body mechanics, Dexterity, Gait, Vision, GMC, Strength, Endurance, Coordination, IADL, Tone   Visit Diagnosis: 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)  Other lack of coordination  Muscle weakness (generalized)   Problem List Patient Active Problem List   Diagnosis Date Noted   Spastic hemiplegia of right dominant side as late effect of cerebral infarction (HCC) 07/11/2021   Other hyperlipidemia 07/06/2021   Brainstem infarct, acute (HCC) 03/01/2021   Acute cerebral infarction (HCC) 02/24/2021   Thyroid nodule 02/24/2021   Diabetes (HCC) 02/24/2021    Rosie Fate, OTR/L, MSOT 07/25/2021, 2:55 PM  [facility] [address] [place], [place], [address] Phone: [phone]   Fax:  [phone]  Name: Krystena Reitter MRN: [id number] Date of Birth: 1946/06/30

## 2021-07-25 NOTE — Therapy (Signed)
[address] [place], [place], [address] Phone: [phone]   Fax:  [phone]  Physical Therapy Treatment  Patient Details  Name: Carrie Kline MRN: [id number] Date of Birth: May 15, 1946 Referring Provider (PT): Cathlyn Parsons, PA-C   Encounter Date: 07/25/2021   PT End of Session - 07/25/21 1436     Visit Number 24    Number of Visits 26    Date for PT Re-Evaluation 07/26/21    Authorization Type BCBS and Medicaid    PT Start Time 1352    PT Stop Time 1435    PT Time Calculation (min) 43 min    Activity Tolerance Patient tolerated treatment well;Patient limited by fatigue    Behavior During Therapy [facility] for tasks assessed/performed             Past Medical History:  Diagnosis Date   Gout    Seasonal allergies    Stroke [facility])     Past Surgical History:  Procedure Laterality Date   ABDOMINAL HYSTERECTOMY     BACK SURGERY     GANGLION CYST EXCISION      There were no vitals filed for this visit.                      [employer] Adult PT Treatment/Exercise - 07/25/21 0001       Ambulation/Gait   Gait Comments gait with HHA, fast walking 3x 100 feet      High Level Balance   High Level Balance Activities Side stepping;Backward walking    High Level Balance Comments beach ball hits in standing      Lumbar Exercises: Aerobic   Nustep L5 x 7 minutes      Knee/Hip Exercises: Machines for Strengthening   Cybex Knee Extension 5# both legs 2x10 with her trying to eep right shin touching the pad    Cybex Knee Flexion 25# 2x10      Knee/Hip Exercises: Seated   Knee/Hip Flexion ankle DF in long sitting 3x10    Other Seated Knee/Hip Exercises fitter knee extension 1 blue band 3x10 cues for TKE                       PT Short Term Goals - 07/13/21 1156       PT SHORT TERM GOAL #1   Period Weeks      PT SHORT TERM GOAL #2   Title Patient will be I with updated HEP     Baseline Initiated new exercises.    Time 1    Period Weeks    Status On-going    Target Date 07/21/21               PT Long Term Goals - 07/11/21 1151       PT LONG TERM GOAL #1   Title Patient to be independent with advanced HEP.    Status Partially Met      PT LONG TERM GOAL #2   Title Patient to demonstrate R LE strength >/=4/5.    Status On-going      PT LONG TERM GOAL #3   Title Patient to complete TUG in <14 sec with LRAD in order to decrease risk of falls.    Status On-going                   Plan - 07/25/21 1437     Clinical Impression Statement Patient  is doing better, she now has good movements in the toes and some DF against gravity, she is using the [facility] for most ambulation in the home, out in public she uses the rollator.  She fatigues at about 1 minute of walking or 1 minute of standing    PT Next Visit Plan write renewal next visit, check TUG and balance    Consulted and Agree with Plan of Care Patient             Patient will benefit from skilled therapeutic intervention in order to improve the following deficits and impairments:  Abnormal gait, Decreased coordination, Decreased range of motion, Difficulty walking, Increased fascial restricitons, Increased muscle spasms, Decreased safety awareness, Decreased activity tolerance, Improper body mechanics, Impaired flexibility, Decreased balance, Postural dysfunction, Decreased strength, Decreased mobility  Visit Diagnosis: 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)  Other lack of coordination  Muscle weakness (generalized)  Unsteadiness on feet  Paralytic gait     Problem List Patient Active Problem List   Diagnosis Date Noted   Spastic hemiplegia of right dominant side as late effect of cerebral infarction ([place]) 07/11/2021   Other hyperlipidemia 07/06/2021   Brainstem infarct, acute ([place]) 03/01/2021   Acute cerebral  infarction ([place]) 02/24/2021   Thyroid nodule 02/24/2021   Diabetes ([place]) 02/24/2021    Sumner Boast, PT 07/25/2021, 2:38 PM  [address] [place], [place], [address] Phone: [phone]   Fax:  [phone]  Name: Carrie Kline MRN: [id number] Date of Birth: 01/03/1946

## 2021-07-27 ENCOUNTER — Ambulatory Visit: Payer: Medicare Other | Admitting: Physical Therapy

## 2021-07-27 ENCOUNTER — Ambulatory Visit: Payer: Medicare Other | Admitting: Occupational Therapy

## 2021-08-01 ENCOUNTER — Ambulatory Visit (INDEPENDENT_AMBULATORY_CARE_PROVIDER_SITE_OTHER): Payer: Medicare Other | Admitting: Adult Health

## 2021-08-01 ENCOUNTER — Encounter: Payer: Self-pay | Admitting: Adult Health

## 2021-08-01 ENCOUNTER — Ambulatory Visit: Payer: Medicare Other | Admitting: Occupational Therapy

## 2021-08-01 ENCOUNTER — Ambulatory Visit: Payer: Medicare Other | Admitting: Physical Therapy

## 2021-08-01 VITALS — BP 160/96 | HR 99 | Ht 63.0 in | Wt 190.0 lb

## 2021-08-01 DIAGNOSIS — I69351 Hemiplegia and hemiparesis following cerebral infarction affecting right dominant side: Secondary | ICD-10-CM | POA: Diagnosis not present

## 2021-08-01 DIAGNOSIS — G459 Transient cerebral ischemic attack, unspecified: Secondary | ICD-10-CM

## 2021-08-01 DIAGNOSIS — I639 Cerebral infarction, unspecified: Secondary | ICD-10-CM

## 2021-08-01 DIAGNOSIS — E785 Hyperlipidemia, unspecified: Secondary | ICD-10-CM

## 2021-08-01 MED ORDER — CLOPIDOGREL BISULFATE 75 MG PO TABS: 75.0000 mg | ORAL_TABLET | Freq: Every day | ORAL | 11 refills | Status: DC

## 2021-08-01 NOTE — Patient Instructions (Addendum)
Stop aspirin 81 mg daily and start Plavix 75mg  daily  Atorvastatin 40mg  daily  for secondary stroke prevention - we will recheck cholesterol levels today  Continue to follow up with PCP regarding cholesterol and blood pressure management  Maintain strict control of hypertension with blood pressure goal below 130/90 and cholesterol with LDL cholesterol (bad cholesterol) goal below 70 mg/dL.   Continue working with therapies for hopeful ongoing recovery     Followup in the future with me in 6 months or call earlier if needed      Thank you for coming to see at [facility]. I hope we have been able to provide you high quality care today.  You may receive a patient satisfaction survey over the next few weeks. We would appreciate your feedback and comments so that we may continue to improve ourselves and the health of our patients.

## 2021-08-01 NOTE — Progress Notes (Signed)
[facility] [address] [place]. [place] [address] [phone]       STROKE FOLLOW UP NOTE  [name] Date of Birth:  01/11/1946 Medical Record Number:  [id number]   Reason for Referral:  Stroke follow up    SUBJECTIVE:   CHIEF COMPLAINT:  Chief Complaint  Patient presents with   Follow-up    RM 3 with daughter Carrie Kline  Pt is well and stable, still doing PT and making improvements. No now concerns     HPI:   Update 08/01/2021 JM: Returns for 24-month stroke follow-up accompanied by her daughter, Carrie Kline.  Presented to ER on 06/19/2021 with left-sided facial weakness upon awakening and resolved within an hour. Per review of ER note, they were unable to appreciate any significant facial weakness.  No other new stroke/TIA symptoms.  Concern of possible TIA vs small stroke - CTA head/neck completed which did not show any new stroke and stable appearance of intracranial stenosis compared to prior imaging. She has not had any reoccurring or new stroke/TIA symptoms since that time  Reports continued right-sided weakness but overall greatly improving Continues to work with PT/OT.  Ambulates with cane - no recent falls  Compliant on aspirin and atorvastatin -denies side effects Blood pressure today elevated at 174/97 and on recheck 160/96 - does not routinely monitor at home She is recovering from the flu and has had mild generalized headache since that time. She questions if she has now developed a sinus infection. She has been increasing intake of canned soup since being sick.   No further concerns at this time    History provided for reference purposes only Initial visit 05/08/2021 JM: Carrie Kline is being seen for hospital follow-up accompanied by her daughter, Carrie Kline. Residual right sided weakness with improvement working with therapies.  She does have occasional shoulder stiffness and spasm sensation but denies associated pain.  Currently using RW and  AFO brace without any recent falls.  Therapy has been working with her on transitioning to cane.  Denies any residual speech difficulty.  Her daughter has been staying with her to assist as needed.  Denies new stroke/TIA symptoms. On aspirin and atorvastatin. Blood pressure today 138/80- does not monitor at home.  No further concerns at this time.  Stroke admission 02/24/2021 Carrie Kline is a 75 y.o. female with history of chronic headache admitted on 02/24/2021 for right-sided weakness, slurred speech and right facial droop.  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  EF 55 to 60%.  LDL 121 - started on atorvastatin 40mg  daily.  A1c 7.4 (new dx of DM) -metformin initiated.  Recommended DAPT for 3 weeks and aspirin alone.  No prior stroke history.  Therapy evaluation recommended CIR with residual right hemiparesis, dysarthria and gait impairment.    PERTINENT IMAGING/LABs  CTA head/neck 06/20/2019 0 IMPRESSION: No acute head CT finding. Subtle evidence of the left pontine stroke which was acute in July of this year. Otherwise negative. No intracranial large or medium vessel occlusion. Minimal atherosclerotic change at the left carotid bifurcation but no stenosis. 50% stenosis of the left vertebral artery origin. Atherosclerotic disease and tortuosity of the left vertebral artery V4 segment, but without stenosis greater than 30%. More distal PCA branches show atherosclerotic irregularity.   Per hospitalization 02/24/2021 CT no acute abnormality. CT head and neck left VA origin in the bilateral distal PCA branches stenosis MRI left pontine  infarct 2D Echo EF 55 to 60% LDL 121 HgbA1c 7.4    ROS:   14 system review of systems performed and negative with exception of those listed in HPI  PMH:  Past Medical History:  Diagnosis Date   Gout    Seasonal allergies    Stroke (HCC)     PSH:  Past  Surgical History:  Procedure Laterality Date   ABDOMINAL HYSTERECTOMY     BACK SURGERY     GANGLION CYST EXCISION      Social History:  Social History   Socioeconomic History   Marital status: Widowed    Spouse name: Not on file   Number of children: Not on file   Years of education: Not on file   Highest education level: Not on file  Occupational History   Not on file  Tobacco Use   Smoking status: Never   Smokeless tobacco: Never  Vaping Use   Vaping Use: Never used  Substance and Sexual Activity   Alcohol use: No   Drug use: No   Sexual activity: Not on file  Other Topics Concern   Not on file  Social History Narrative   Not on file   Social Determinants of Health   Financial Resource Strain: Not on file  Food Insecurity: Not on file  Transportation Needs: Not on file  Physical Activity: Not on file  Stress: Not on file  Social Connections: Not on file  Intimate Partner Violence: Not on file    Family History:  Family History  Problem Relation Age of Onset   Heart attack Father    Diabetes Sister    Thyroid disease Daughter     Medications:   Current Outpatient Medications on File Prior to Visit  Medication Sig Dispense Refill   atorvastatin (LIPITOR) 40 MG tablet Take 1 tablet (40 mg total) by mouth daily. 90 tablet 1   No current facility-administered medications on file prior to visit.    Allergies:   Allergies  Allergen Reactions   Codeine Itching   Darvon [Propoxyphene]     itching   Hydrocortisone     itching      OBJECTIVE:  Physical Exam  Vitals:   08/01/21 1431 08/01/21 1445  BP: (!) 174/97 (!) 160/96  Pulse: 99   Weight: 190 lb (86.2 kg)   Height: 5\' 3"  (1.6 m)    Body mass index is 33.66 kg/m. No results found.  General: well developed, well nourished, very pleasant elderly African-American female, seated, in no evident distress Head: head normocephalic and atraumatic.   Neck: supple with no carotid or  supraclavicular bruits Cardiovascular: regular rate and rhythm, no murmurs Musculoskeletal: no deformity Skin:  no rash/petichiae Vascular:  Normal pulses all extremities   Neurologic Exam Mental Status: Awake and fully alert.  Fluent speech and language.  Oriented to place and time. Recent and remote memory intact. Attention span, concentration and fund of knowledge appropriate. Mood and affect appropriate.  Cranial Nerves: Pupils equal, briskly reactive to light. Extraocular movements full without nystagmus. Visual fields full to confrontation. Hearing intact. Facial sensation intact.  Very slight right lower facial weakness.  Tongue, palate moves normally and symmetrically.  Motor: Normal strength, bulk and tone left upper and lower extremity.  RUE: 4+/5 deltoid, 4/5 bicep and tricep, 3+/5 grip strength; limited shoulder ROM RLE: 4/5 hip flexor, knee extension and flexion, 3/5 ADF with AFO brace in place Sensory.: intact to touch , pinprick , position and vibratory sensation.  Coordination: Rapid alternating movements normal on left side. Finger-to-nose and heel-to-shin performed accurately on left side. Gait and Station: Arises from chair without difficulty. Stance is normal. Gait demonstrates hemiplegic gait with decreased RLE step height and use of cane. Tandem walk and heel-toe not attempted  Reflexes: 1+ and symmetric. Toes downgoing.         ASSESSMENT: Carrie Kline is a 75 y.o. 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 Possible TIA on 06/19/2021 after presenting with left facial weakness lasting approximately 1 hour with imaging unremarkable.  Vascular risk factors include HLD, advanced age, obesity, migraines, DM.      PLAN:  Left pontine stroke: TIA:  Residual deficit: Right hemiparesis and gait impairment with continued improvement.  Encouraged continued  participation with therapies for hopeful ongoing recovery Due to recent possible TIA, would recommend switching asa to plavix 75mg  daily and continue atorvastatin 40 mg daily for secondary stroke prevention.   Discussed secondary stroke prevention measures and importance of close PCP follow up for aggressive stroke risk factor management. I have gone over the pathophysiology of stroke, warning signs and symptoms, risk factors and their management in some detail with instructions to go to the closest emergency room for symptoms of concern. HLD: LDL goal <70.  Prior LDL 121.  Continue atorvastatin 40 mg daily.  Repeat lipid panel to ensure adequate management with known intracranial stenosis DMII: A1c goal<7.0.  Prior A1c 7.4.  On nonpharmacological management monitored by PCP    Follow up in 6 months or call earlier if needed   CC:  PCP: , MD    I spent 38 minutes of face-to-face and non-face-to-face time with patient and daughter.  This included previsit chart review including review of recent ER admission, lab review, study review, electronic health record documentation, patient and daughter education and discussion regarding prior stroke including etiology, recent possible TIA, secondary stroke prevention measures and importance of managing stroke risk factors, residual deficits and possible further recovery and answered all other questions to patient and daughters satisfaction  Georganna Skeans, [facility]  [facility] [address] [place], [place]  Phone [phone] Fax [phone] Note: This document was prepared with digital dictation and possible smart phrase technology. Any transcriptional errors that result from this process are unintentional.

## 2021-08-02 LAB — LIPID PANEL
Chol/HDL Ratio: 4.3 ratio (ref 0.0–4.4)
Cholesterol, Total: 107 mg/dL (ref 100–199)
HDL: 25 mg/dL — ABNORMAL LOW (ref >39)
LDL Chol Calc (NIH): 62 mg/dL (ref 0–99)
Triglycerides: 103 mg/dL (ref 0–149)
VLDL Cholesterol Cal: 20 mg/dL (ref 5–40)

## 2021-08-03 ENCOUNTER — Ambulatory Visit: Payer: Medicare Other | Admitting: Occupational Therapy

## 2021-08-03 ENCOUNTER — Ambulatory Visit: Payer: Medicare Other | Admitting: Physical Therapy

## 2021-08-08 ENCOUNTER — Ambulatory Visit: Payer: Medicare Other | Admitting: Occupational Therapy

## 2021-08-08 ENCOUNTER — Other Ambulatory Visit: Payer: Self-pay

## 2021-08-08 ENCOUNTER — Encounter: Payer: Self-pay | Admitting: Occupational Therapy

## 2021-08-08 ENCOUNTER — Ambulatory Visit: Payer: Medicare Other | Attending: Physician Assistant | Admitting: Physical Therapy

## 2021-08-08 ENCOUNTER — Encounter: Payer: Self-pay | Admitting: Physical Therapy

## 2021-08-08 DIAGNOSIS — I69353 Hemiplegia and hemiparesis 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: Secondary | ICD-10-CM | POA: Diagnosis not present

## 2021-08-08 DIAGNOSIS — R278 Other lack of coordination: Secondary | ICD-10-CM

## 2021-08-08 DIAGNOSIS — R2681 Unsteadiness on feet: Secondary | ICD-10-CM | POA: Insufficient documentation

## 2021-08-08 DIAGNOSIS — M6281 Muscle weakness (generalized): Secondary | ICD-10-CM | POA: Diagnosis present

## 2021-08-08 DIAGNOSIS — I639 Cerebral infarction, unspecified: Secondary | ICD-10-CM | POA: Diagnosis present

## 2021-08-08 DIAGNOSIS — R261 Paralytic gait: Secondary | ICD-10-CM | POA: Insufficient documentation

## 2021-08-08 NOTE — Therapy (Signed)
[place]. [place], [place], [address] Phone: [phone]   Fax:  [phone]  Physical Therapy Treatment  Patient Details  Name: Carrie Kline MRN: [id number] Date of Birth: 1946/04/04 Referring Provider (PT): Cathlyn Parsons, PA-C   Encounter Date: 08/08/2021   PT End of Session - 08/08/21 1409     Visit Number 25    Number of Visits 26    Date for PT Re-Evaluation 07/26/21    Authorization Type BCBS and Medicaid    PT Start Time 1405    PT Stop Time 1445    PT Time Calculation (min) 40 min    Activity Tolerance Patient tolerated treatment well;Patient limited by fatigue    Behavior During Therapy [facility] for tasks assessed/performed             Past Medical History:  Diagnosis Date   Gout    Seasonal allergies    Stroke [facility])     Past Surgical History:  Procedure Laterality Date   ABDOMINAL HYSTERECTOMY     BACK SURGERY     GANGLION CYST EXCISION      There were no vitals filed for this visit.   Subjective Assessment - 08/08/21 1409     Subjective Pt states she's had the flu. She's only now just starting to feel better. "I was in bed from Tuesday to Sunday."    Patient is accompained by: Family member    Pertinent History gout, back surgery    Limitations Walking;House hold activities;Lifting;Standing    How long can you stand comfortably? ~3 min    How long can you walk comfortably? 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 No/denies    Pain Onset In the past 7 days   Still sore from flu shot last Wed; states it is improving               [facility] PT Assessment - 08/08/21 0001       Assessment   Medical Diagnosis Brainstem  cerebral infarction    Referring Provider (PT) Cathlyn Parsons, PA-C    Onset Date/Surgical Date 02/24/21    Hand Dominance Left      Strength   Right Hip Flexion 3+/5    Right Hip Extension 3/5    Right Hip ABduction 4-/5    Right Knee Flexion 4-/5    Right Knee Extension 4-/5      Transfers   Five time sit to stand comments  9 sec      Berg Balance Test   Sit to Stand Able to stand without using hands and stabilize independently    Standing Unsupported Able to stand safely 2 minutes    Sitting with Back Unsupported but Feet Supported on Floor or Stool Able to sit safely and securely 2 minutes    Stand to Sit Controls descent by using hands    Transfers Able to transfer safely, minor use of hands    Standing Unsupported with Eyes Closed Able to stand 10 seconds safely    Standing Unsupported with Feet Together Able to place feet together independently and stand 1 minute safely    From Standing, Reach Forward  with Outstretched Arm Can reach confidently >25 cm (10")    From Standing Position, Pick up Object from [place] to pick up shoe safely and easily    From Standing Position, Turn to Look Behind Over each Shoulder Looks behind from both sides and weight shifts well    Turn 360 Degrees Able to turn 360 degrees safely but slowly    Standing Unsupported, Alternately Place Feet on Step/Stool Able to stand independently and complete 8 steps >20 seconds    Standing Unsupported, One Foot in Front Able to place foot tandem independently and hold 30 seconds    Standing on One Leg Tries to lift leg/unable to hold 3 seconds but remains standing independently    Total Score 49      Timed Up and Go Test   Normal TUG (seconds) 24   24 sec no a/d; 18 sec with cane                          [facility] Adult PT Treatment/Exercise - 08/08/21 0001       Lumbar Exercises: Aerobic   Nustep L4 x 10 minutes                       PT Short Term Goals - 07/13/21  1156       PT SHORT TERM GOAL #1   Period Weeks      PT SHORT TERM GOAL #2   Title Patient will be I with updated HEP    Baseline Initiated new exercises.    Time 1    Period Weeks    Status On-going    Target Date 07/21/21               PT Long Term Goals - 08/08/21 1417       PT LONG TERM GOAL #1   Title Patient to be independent with advanced HEP.    Status Partially Met      PT LONG TERM GOAL #2   Title Patient to demonstrate R LE strength >/=4/5.    Baseline 4-/5 grossly    Status On-going      PT LONG TERM GOAL #3   Title Patient to complete TUG in <14 sec with LRAD in order to decrease risk of falls.    Baseline 18 sec with cane    Status On-going      PT LONG TERM GOAL #4   Title Patient to complete 5xSTS in <15 sec in order to decrease risk of falls.    Status Achieved      PT LONG TERM GOAL #5   Title Patient to score atleast 47/56 on Berg in order to decrease risk of falls.    Baseline 49/56    Status Achieved                   Plan - 08/08/21 1443     Clinical Impression Statement Pt getting over flu from last week. Still feeling some fatigue. Treatment focused on re-checking pt's LTGs. In terms of balance, pt's Berg Balance Score has increased and no longer qualifies as a high fall risk meeting this LTG. Amb and dynamic gait remains the most limited; TUG score is not as good as last trial likely due to her fatigue. Otherwise, 5x STS and general strength has improved. Pt would benefit from continued PT to keep making mobility gains.    Personal Factors and  Comorbidities Comorbidity 2;Age;Time since onset of injury/illness/exacerbation;Past/Current Experience    Comorbidities gout, back surgery    Examination-Activity Limitations Bathing;Transfers;Locomotion Level;Bed Mobility;Bend;Sit;Caring for Others;Carry;Squat;Stairs;Stand;Toileting;Lift;Hygiene/Grooming;Dressing    Rehab Potential Good    PT Frequency 2x / week    PT Duration 6 weeks     PT Treatment/Interventions ADLs/Self Care Home Management;Cryotherapy;DME Instruction;Electrical Stimulation;Ultrasound;Moist Heat;Gait training;Stair training;Functional mobility training;Therapeutic activities;Therapeutic exercise;Balance training;Neuromuscular re-education;Patient/family education;Orthotic Fit/Training;Manual techniques;Splinting;Energy conservation;Dry needling;Passive range of motion;Taping;Vasopneumatic Device;Visual/perceptual remediation/compensation    PT Next Visit Plan Continue working on dynamic balance, gait/amb, obstacle negotiation and general strengthening    PT [place] and Agree with Plan of Care Patient             Patient will benefit from skilled therapeutic intervention in order to improve the following deficits and impairments:  Abnormal gait, Decreased coordination, Decreased range of motion, Difficulty walking, Increased fascial restricitons, Increased muscle spasms, Decreased safety awareness, Decreased activity tolerance, Improper body mechanics, Impaired flexibility, Decreased balance, Postural dysfunction, Decreased strength, Decreased mobility  Visit Diagnosis: 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)  Other lack of coordination  Muscle weakness (generalized)  Unsteadiness on feet  Paralytic gait  Acute cerebral infarction [facility])     Problem List Patient Active Problem List   Diagnosis Date Noted   Spastic hemiplegia of right dominant side as late effect of cerebral infarction ([place]) 07/11/2021   Other hyperlipidemia 07/06/2021   Brainstem infarct, acute ([place]) 03/01/2021   Acute cerebral infarction ([place]) 02/24/2021   Thyroid nodule 02/24/2021   Diabetes ([place]) 02/24/2021    Azzam Mehra April Gordy Levan, PT, DPT 08/08/2021, 2:47 PM  [place]. [place], [place], [address] Phone:  [phone]   Fax:  [phone]  Name: Carrie Kline MRN: [id number] Date of Birth: 07-22-1946

## 2021-08-08 NOTE — Therapy (Signed)
[facility] [address]. [place], [place], [address] Phone: [phone]   Fax:  [phone]  Occupational Therapy Treatment  Patient Details  Name: Carrie Kline MRN: [id number] Date of Birth: Sep 08, 1945 Referring Provider (OT): Mariam Dollar, PA-C   Encounter Date: 08/08/2021   OT End of Session - 08/08/21 1446     Visit Number 25    Number of Visits 31   Initial 23 + 8 additional visits (2x/week)   Date for OT Re-Evaluation 08/17/21    Authorization Type BCBS Medicare (primary); Medicaid [place] (secondary)    Authorization Time Period VL: [place]    Progress Note Due on Visit 30    OT Start Time 1445    OT Stop Time 1530    OT Time Calculation (min) 45 min    Activity Tolerance Patient tolerated treatment well    Behavior During Therapy WFL for tasks assessed/performed            Past Medical History:  Diagnosis Date   Gout    Seasonal allergies    Stroke [facility])     Past Surgical History:  Procedure Laterality Date   ABDOMINAL HYSTERECTOMY     BACK SURGERY     GANGLION CYST EXCISION      There were no vitals filed for this visit.   Subjective Assessment - 08/08/21 1441     Subjective  Pt states she has missed her last few appointments because she had the flu and is still feeling a little tired because of that    Pertinent History CVA w/ R-sided weakness on 02/24/21; Hx of gout and back surgery    Patient Stated Goals "Moving my R arm"    Currently in Pain? No/denies             Treatment/Exercises - 08/08/21    Shoulder Overhead Press Closed-chain AAROM of BUE shoulder overhead press w/ 1# dowel rod; completed in sitting 3x10 w/ rest breaks between sets. Tactile cues required during first set to decrease compensatory movement patterns. Crepitus palpated at shoulder; pt reported no pain. Alignment improved and compensatory scapular elevation decreased w/ repetition.    Elbow Extension Light strengthening of R  elbow extension against resistance (yellow theraband); completed 2 sets of 10 reps. OT provided mod verbal cues and modeling for alignment of elbow extension vs. shoulder extension.  AROM of R elbow extension while seated (gravity-assisted); completed 1 set of 15 reps w/ OT providing tactile cue proximal to elbow to block compensatory shoulder extension vs elbow extension  Activity modified to isometric R elbow extension for increased muscle activation w/ proprioceptive input and decreased opportunity for compensatory movement patterns; completed 1 set of 15 reps w/ OT providing resistance and then had pt return demonstration for carryover to home.    Wrist Extension Light strengthening of wrist extension w/ 1 lb dumbbell; completed 2 sets of 15 reps w/ rest breaks between sets and forearm positioned in pronation. Able to complete w/out difficulty.            OT Short Term Goals - 06/15/21 1023       OT SHORT TERM GOAL #1   Title Pt will improve independence w/ functional FM tasks (e.g., clothing manipulatives) as evidenced by being able to complete 9HPT w/ R hand    Baseline Completed 4 pegs in >1 min w R hand    Time 4    Period Weeks    Status Achieved   04/20/21 -  1 min, 23 sec   Target Date 04/28/21      OT SHORT TERM GOAL #2   Title Pt will improve AROM of R shoulder scaption by at least 20 degrees to facilitate improved functional reach   Revised goal from achieving shoulder flexion to scaption due to focus on function   Baseline R shoulder scaption 28 degrees    Time 4    Period Weeks    Status Achieved   06/06/21 - 96* of scaption w/ difficulty attaining true flexion beyond chest height; 05/02/21 - 52* of scaption     OT SHORT TERM GOAL #3   Title Pt will improve AROM of R elbow extension by at least 15 degrees to facilitate improved functional reach    Baseline Achieves 52 degrees of flexion when attempting elbow extension    Time 4    Period Weeks    Status Achieved    04/18/21 - 21* of flex w/ elbow ext     OT SHORT TERM GOAL #4   Title Pt will increase R hand grip strength by at least 4 lbs to improve independence w/ IADLs    Baseline 04/25/21 - RUE 4# (LUE 39#)    Time 4    Period Weeks    Status On-going   06/06/21 - 6# w/ RUE            OT Long Term Goals - 07/18/21 1530       OT LONG TERM GOAL #1   Title Pt will be able to cut food w/ Mod I, using AE/compensatory strategies prn, 100% of the time    Baseline Frequently requires assist cutting food    Time 8    Period Weeks    Status Achieved   05/04/21     OT LONG TERM GOAL #2   Title Pt will be able to fasten/unfasten clothing manipulatives in 5/5 trials w/ Mod I, using AE prn    Baseline Requires assist w/ fasteners    Time 8    Period Weeks    Status Achieved   05/04/21     OT LONG TERM GOAL #3   Title Pt will demonstrate simulated tub/shower transfer safely w/ Mod I    Baseline Min A w/ tub/shower transfer    Time 8    Period Weeks    Status Achieved   06/13/21 - per pt report     OT LONG TERM GOAL #4   Title Pt will demonstrate independence w/ HEP designed for coordination, ROM, strength, and functional use of RUE by d/c    Baseline No HEP at this time    Time 8    Period Weeks    Status Achieved   07/18/21 - reports consistent carryover of exercises     OT LONG TERM GOAL #5   Title Pt will be able to safely place/retrieve medium-weight object (approx 5 lbs) at an overhead height w/ BUEs    Baseline Decreased RUE ROM/strength impacting functional reach    Time 4    Period Weeks    Status On-going    Target Date 08/17/21      OT LONG TERM GOAL #6   Title Pt will demonstrate improved RUE GMC as evidenced by increasing Box and Blocks score to at least 52 blocks    Baseline LUE 68; RUE 47    Time 4    Period Weeks    Status On-going    Target Date 08/17/21  Plan - 08/08/21 1447     Clinical Impression Statement Pt appears pleased with her progress in  therapy, states she was able to use only her cane when she attended church on Sunday, and feels excited to return to therapy after missing previous 2 weeks due to the flu. Pt reports she was not able to complete her HEP consistently when she was sick, but used a 1 lb dumbbell for elbow extension in supine, which OT continued to adddress this session. OT provided education regarding isometric strengthening and emphasis on true elbow extension vs shoulder extension w/ pt returning demonstration. Closed-chain AAROM of shoulder overhead press used to facilitate functional reach and equal activation w/ additional somatosensory input for increased NMR of RUE. Increased AROM w/ decreased compensatory patterns observed w/ repetition. Pt continues to benefit from repetition and is demonstrating good understanding of preventing compensation when completing exercises.    OT Occupational Profile and History Detailed Assessment- Review of Records and additional review of physical, cognitive, psychosocial history related to current functional performance    Occupational performance deficits (Please refer to evaluation for details): ADL's;IADL's;Work;Leisure;Social Participation    Body Structure / Function / Physical Skills ADL;ROM;UE functional use;Mobility;FMC;Decreased knowledge of use of DME;Balance;Body mechanics;Dexterity;Gait;Vision;GMC;Strength;Endurance;Coordination;IADL;Tone    Rehab Potential Good    Clinical Decision Making Several treatment options, min-mod task modification necessary    Comorbidities Affecting Occupational Performance: May have comorbidities impacting occupational performance    Modification or Assistance to Complete Evaluation  Min-Moderate modification of tasks or assist with assess necessary to complete eval    OT Frequency 2x / week    OT Duration 8 weeks    OT Treatment/Interventions Self-care/ADL training;Electrical Stimulation;Therapeutic exercise;Visual/perceptual  remediation/compensation;Patient/family education;Splinting;Neuromuscular education;Moist Heat;Aquatic Therapy;Biofeedback;Energy conservation;Building services engineer;Therapeutic activities;Balance training;Passive range of motion;Manual Therapy;DME and/or AE instruction;Ultrasound;Cryotherapy    Plan Weight-bearing through R hand/UE for NMR (emphasis on elbow extension, NMES?) Continue w/ functional reach; FMC/dexterity (easy-grip pegs and marbles)    OT Home Exercise Plan AROM of scapular elevation/shoulder shrug; scapular retraction w/ elbows flexed; upper trap stretch / levator scapular stretch; chest press in supine; elbow extension w/ dumbbell while seated or w/ ball in supine; gross grasp, lateral pinch, and pinch-and-pull w/ putty    Recommended Other Services Pt currently receiving PT exercises at this location    Consulted and Agree with Plan of Care Patient;Family member/caregiver    Family Member Consulted Kim (daughter)            Patient will benefit from skilled therapeutic intervention in order to improve the following deficits and impairments:   Body Structure / Function / Physical Skills: ADL, ROM, UE functional use, Mobility, FMC, Decreased knowledge of use of DME, Balance, Body mechanics, Dexterity, Gait, Vision, GMC, Strength, Endurance, Coordination, IADL, Tone   Visit Diagnosis: 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)  Other lack of coordination  Muscle weakness (generalized)  Unsteadiness on feet   Problem List Patient Active Problem List   Diagnosis Date Noted   Spastic hemiplegia of right dominant side as late effect of cerebral infarction (HCC) 07/11/2021   Other hyperlipidemia 07/06/2021   Brainstem infarct, acute (HCC) 03/01/2021   Acute cerebral infarction (HCC) 02/24/2021   Thyroid nodule 02/24/2021   Diabetes (HCC) 02/24/2021    Rosie Fate, OTR/L, MSOT 08/08/2021, 2:51 PM  [facility]  [facility] [address] [place], [place], [address] Phone: [phone]   Fax:  [phone]  Name: Cobie Leidner MRN: [id number] Date  of Birth: 13-Feb-1946

## 2021-08-10 ENCOUNTER — Ambulatory Visit: Payer: Medicare Other | Admitting: Occupational Therapy

## 2021-08-10 ENCOUNTER — Encounter: Payer: Self-pay | Admitting: Occupational Therapy

## 2021-08-10 ENCOUNTER — Other Ambulatory Visit: Payer: Self-pay

## 2021-08-10 ENCOUNTER — Encounter: Payer: Self-pay | Admitting: Physical Therapy

## 2021-08-10 ENCOUNTER — Ambulatory Visit: Payer: Medicare Other | Admitting: Physical Therapy

## 2021-08-10 DIAGNOSIS — I69353 Hemiplegia and hemiparesis 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: Secondary | ICD-10-CM

## 2021-08-10 DIAGNOSIS — M6281 Muscle weakness (generalized): Secondary | ICD-10-CM

## 2021-08-10 DIAGNOSIS — R278 Other lack of coordination: Secondary | ICD-10-CM

## 2021-08-10 DIAGNOSIS — I639 Cerebral infarction, unspecified: Secondary | ICD-10-CM

## 2021-08-10 DIAGNOSIS — R2681 Unsteadiness on feet: Secondary | ICD-10-CM

## 2021-08-10 DIAGNOSIS — R261 Paralytic gait: Secondary | ICD-10-CM

## 2021-08-10 NOTE — Therapy (Signed)
[place]. [place], [place], [address] Phone: [phone]   Fax:  [phone]  Physical Therapy Treatment  Patient Details  Name: Carrie Kline MRN: [id number] Date of Birth: Jan 28, 1946 Referring Provider (PT): Cathlyn Parsons, PA-C   Encounter Date: 08/10/2021   PT End of Session - 08/10/21 1443     Visit Number 26    Number of Visits 38    Date for PT Re-Evaluation 09/21/21    PT Start Time 1400    PT Stop Time 1438    PT Time Calculation (min) 38 min    Activity Tolerance Patient tolerated treatment well;Patient limited by fatigue    Behavior During Therapy [facility] for tasks assessed/performed             Past Medical History:  Diagnosis Date   Gout    Seasonal allergies    Stroke [facility])     Past Surgical History:  Procedure Laterality Date   ABDOMINAL HYSTERECTOMY     BACK SURGERY     GANGLION CYST EXCISION      There were no vitals filed for this visit.   Subjective Assessment - 08/10/21 1407     Subjective Patient reports she is slowly recovering from the Flu. She still fatigues easily. Her Dr told her to continue therapy and just do the best she can.    Patient is accompained by: Family member    Pertinent History gout, back surgery    Limitations Walking;House hold activities;Lifting;Standing    How long can you stand comfortably? ~3 min    How long can you walk comfortably? 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 No/denies    Pain Onset In the past 7 days   Still sore from flu shot last Wed; states it is improving                              [facility] Adult PT Treatment/Exercise - 08/10/21 0001       Lumbar  Exercises: Aerobic   Stationary Bike L3.0 x 4 minutes      Knee/Hip Exercises: Standing   Lateral Step Up Right;2 sets;5 reps    Forward Step Up Right;2 sets;5 reps                     PT Education - 08/10/21 1426     Education Details Patient frustrated at rate of recovery from Flu. Educated her to expect at least 3-4 days per each day she was lying in bed.    Person(s) Educated Patient    Methods Explanation    Comprehension Verbalized understanding              PT Short Term Goals - 08/10/21 1439       PT SHORT TERM GOAL #1   Period Weeks      PT SHORT TERM GOAL #2   Title Patient will be I with updated HEP    Baseline Program was deferred during flu.    Time 1    Period Weeks    Status On-going    Target Date 08/24/21  PT Long Term Goals - 08/10/21 1441       PT LONG TERM GOAL #1   Title Patient to be independent with advanced HEP.    Time 6    Period Weeks    Status Partially Met    Target Date 09/21/21      PT LONG TERM GOAL #2   Title Patient to demonstrate R LE strength >/=4/5.    Baseline 4-/5 grossly    Time 6    Period Weeks    Status On-going    Target Date 09/21/21      PT LONG TERM GOAL #3   Title Patient to complete TUG in <14 sec with LRAD in order to decrease risk of falls.    Baseline 18 sec with cane    Time 6    Period Weeks    Status On-going    Target Date 09/21/21      PT LONG TERM GOAL #4   Title Patient to complete 5xSTS in <15 sec in order to decrease risk of falls.    Period Weeks    Status Achieved      PT LONG TERM GOAL #5   Title Patient to score atleast 47/56 on Berg in order to decrease risk of falls.    Baseline 49/56    Status Achieved                   Plan - 08/10/21 1431     Clinical Impression Statement Patient is slowly recovering from the Flu episode. She will benefit from continued therapy in order to recover from the Flu and progress iwth her recovery from CVA.     Personal Factors and Comorbidities Comorbidity 2;Age;Time since onset of injury/illness/exacerbation;Past/Current Experience    Examination-Activity Limitations Bathing;Transfers;Locomotion Level;Bed Mobility;Bend;Sit;Caring for Others;Carry;Squat;Stairs;Stand;Toileting;Lift;Hygiene/Grooming;Dressing    Examination-Participation Restrictions Church;Meal Prep;Cleaning;Community Activity;Shop;Laundry    Stability/Clinical Decision Making Evolving/Moderate complexity    Clinical Decision Making Moderate    Rehab Potential Good    PT Frequency 2x / week    PT Duration 6 weeks    PT Treatment/Interventions ADLs/Self Care Home Management;Cryotherapy;DME Instruction;Electrical Stimulation;Ultrasound;Moist Heat;Gait training;Stair training;Functional mobility training;Therapeutic activities;Therapeutic exercise;Balance training;Neuromuscular re-education;Patient/family education;Orthotic Fit/Training;Manual techniques;Splinting;Energy conservation;Dry needling;Passive range of motion;Taping;Vasopneumatic Device;Visual/perceptual remediation/compensation    PT Next Visit Plan Progress with strength, blance, activity tolerance as tolerated.    Consulted and Agree with Plan of Care Patient             Patient will benefit from skilled therapeutic intervention in order to improve the following deficits and impairments:  Abnormal gait, Decreased coordination, Decreased range of motion, Difficulty walking, Increased fascial restricitons, Increased muscle spasms, Decreased safety awareness, Decreased activity tolerance, Improper body mechanics, Impaired flexibility, Decreased balance, Postural dysfunction, Decreased strength, Decreased mobility  Visit Diagnosis: 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)  Other lack of coordination  Muscle weakness (generalized)  Unsteadiness on feet  Paralytic gait  Acute cerebral infarction  [facility])     Problem List Patient Active Problem List   Diagnosis Date Noted   Spastic hemiplegia of right dominant side as late effect of cerebral infarction (HCC) 07/11/2021   Other hyperlipidemia 07/06/2021   Brainstem infarct, acute (HCC) 03/01/2021   Acute cerebral infarction (HCC) 02/24/2021   Thyroid nodule 02/24/2021   Diabetes (HCC) 02/24/2021    Iona Beard, DPT 08/10/2021, 2:45 PM  [facility] [place] [address] [place], [place], [address] Phone: [phone]  Fax:  [phone]  Name: Carrie Kline MRN: [id number] Date of Birth: October 04, 1945

## 2021-08-13 NOTE — Therapy (Signed)
[facility] [address] [place], [place], [address] Phone: [phone]   Fax:  [phone]  Occupational Therapy Treatment  Patient Details  Name: Carrie Kline MRN: [id number] Date of Birth: 07/18/1946 Referring Provider (OT): Mariam Dollar, PA-C   Encounter Date: 08/10/2021   OT End of Session - 08/10/21 1444      Visit Number 26   Number of Visits 31    Initial 23 visits + 8 additional visits (2x/week)   Date for OT Re-Evaluation 08/17/2021   Authorization Type BCBS Medicare (primary); Medicaid [place] (secondary)   Progress Note Due on Visit 30   OT Start Time 1440   OT Stop Time 1530   OT Time Calculation (min) 50 min   Activity Tolerance Patient tolerated treatment well   Behavior During Therapy WFL for tasks assessed/performed           Past Medical History:  Diagnosis Date   Gout    Seasonal allergies    Stroke [facility])     Past Surgical History:  Procedure Laterality Date   ABDOMINAL HYSTERECTOMY     BACK SURGERY     GANGLION CYST EXCISION      There were no vitals filed for this visit.   Subjective Assessment - 08/10/21 1443      Subjective Pt reports she is still feeling a little fatigued from being sick recently   Pertinent History CVA w/ R-sided weakness on 02/24/21; Hx of gout and back surgery   Patient Stated Goals "Moving my R arm"   Currently in Pain? No/denies            Treatment/Exercises - 08/10/21      Pegboard Activity Copying pattern onto pegboard w/ easy-grip pegs to facilitate neuromuscular reeducation of R hand coordination and dexterity, pinch and intrinsic hand strength, and RUE GMC w/ good body mechanics. After initial demonstration, pt able to complete activity w/ no cues/facilitation for spatial orientation when copying pattern and demonstrated min drops. OT provided add'l cues for in-hand manipulation of pegs without stabilizing on pegboard for rotation  Picking up marbles  2 at a time, translating each to fingertips, and placing on easy-grip pegs to facilitate NMR of in-hand manipulation, FMC, and intrinsic hand strengthening. Completed w/ multiple drops; increased success with decreased speed w/ OT providing mod cueing for consistency w/ pacing. Pt used 3-pt pinch to retrieve both marbles and pegs and return to containers w/out difficulty.    Coordination Activities Pt completed coordination activities w/ RUE, including: rotating small ball w/ fingertips; tossing ball from one hand to the other; tossing/catching ball w/ ipsilateral UE; rotating cards in-hand/turning cards end-over end; and picking up dried beans w/ pincer grasp to place into a container. Pt required occasional verbal cues to decrease compensatory movement patterns.            OT Short Term Goals - 06/15/21 1023       OT SHORT TERM GOAL #1   Title Pt will improve independence w/ functional FM tasks (e.g., clothing manipulatives) as evidenced by being able to complete 9HPT w/ R hand    Baseline Completed 4 pegs in >1 min w R hand    Time 4    Period Weeks    Status Achieved   04/20/21 - 1 min, 23 sec   Target Date 04/28/21      OT SHORT TERM GOAL #2   Title Pt will improve AROM of R shoulder scaption by at least  20 degrees to facilitate improved functional reach   Revised goal from achieving shoulder flexion to scaption due to focus on function   Baseline R shoulder scaption 28 degrees    Time 4    Period Weeks    Status Achieved   06/06/21 - 96* of scaption w/ difficulty attaining true flexion beyond chest height; 05/02/21 - 52* of scaption     OT SHORT TERM GOAL #3   Title Pt will improve AROM of R elbow extension by at least 15 degrees to facilitate improved functional reach    Baseline Achieves 52 degrees of flexion when attempting elbow extension    Time 4    Period Weeks    Status Achieved   04/18/21 - 21* of flex w/ elbow ext     OT SHORT TERM GOAL #4   Title Pt will increase R  hand grip strength by at least 4 lbs to improve independence w/ IADLs    Baseline 04/25/21 - RUE 4# (LUE 39#)    Time 4    Period Weeks    Status On-going   06/06/21 - 6# w/ RUE            OT Long Term Goals - 07/18/21 1530       OT LONG TERM GOAL #1   Title Pt will be able to cut food w/ Mod I, using AE/compensatory strategies prn, 100% of the time    Baseline Frequently requires assist cutting food    Time 8    Period Weeks    Status Achieved   05/04/21     OT LONG TERM GOAL #2   Title Pt will be able to fasten/unfasten clothing manipulatives in 5/5 trials w/ Mod I, using AE prn    Baseline Requires assist w/ fasteners    Time 8    Period Weeks    Status Achieved   05/04/21     OT LONG TERM GOAL #3   Title Pt will demonstrate simulated tub/shower transfer safely w/ Mod I    Baseline Min A w/ tub/shower transfer    Time 8    Period Weeks    Status Achieved   06/13/21 - per pt report     OT LONG TERM GOAL #4   Title Pt will demonstrate independence w/ HEP designed for coordination, ROM, strength, and functional use of RUE by d/c    Baseline No HEP at this time    Time 8    Period Weeks    Status Achieved   07/18/21 - reports consistent carryover of exercises     OT LONG TERM GOAL #5   Title Pt will be able to safely place/retrieve medium-weight object (approx 5 lbs) at an overhead height w/ BUEs    Baseline Decreased RUE ROM/strength impacting functional reach    Time 4    Period Weeks    Status On-going    Target Date 08/17/21      OT LONG TERM GOAL #6   Title Pt will demonstrate improved RUE GMC as evidenced by increasing Box and Blocks score to at least 52 blocks    Baseline LUE 68; RUE 47    Time 4    Period Weeks    Status On-going    Target Date 08/17/21             Plan - 08/10/21 1447       Clinical Impression Statement Due to pt's report of continued fatigue since being sick  recently, OT focused session on coordination activities to continue to  address NMR of [facility] and dexterity of L hand. Pt demonstrates improved isolated finger movements and strength of pinch prehension compared w/ prior attempts to complete activities using easy-grip pegs and marbles. When executing palm-to-fingertip translation of marbles to place on pegs, pt demonstrated multiple drops, particularly due to pacing. Pt benefited from verbal cues to decrease speed in attempt to improve accuracy and success. Considering success w/ this activity, OT reviewed other examples of coordination activities that pt is able to incorporate into HEP to facilitate continued progress w/ [facility]. Pt was able to complete multiple sets/return demonstration of each exercises w/ min difficulty and OT provided occasional cues to decrease compensatory movement patterns and improve alignment and positioning during activities. OT also provided examples of modifications and continued relevant condition-specific education.   OT Occupational Profile and History Detailed Assessment- Review of Records and additional review of physical, cognitive, psychosocial history related to current functional performance   Occupational performance deficits (Please refer to evaluation for details): ADL's;IADL's;Work;Leisure;Social Participation    Body Structure / Function / Physical Skills ADL;ROM;UE functional use; Mobility;FMC;Decreased knowledge of use of DME;Balance;Body mechanics;Dexterity;Gait;Vision;GMC;Strength;Endurance;Coordination;IADL;Tone    Rehab Potential Good   Clinical Decision Making Several treatment options, min-mod task modification necessary   Comorbidities Affecting Occupational Performance: May have comorbidities impacting occupational performance   Modification or Assistance to Complete Evaluation Min-Moderate modification of tasks or assist with assess necessary to complete eval   OT Frequency 2x / week   OT Duration 8 weeks   OT Treatment/Interventions Self-care/ADL training;Electrical  Stimulation;Therapeutic exercise;Visual/perceptual remediation/compensation;Patient/family education;Splinting;Neuromuscular education;Moist Heat;Aquatic Therapy;Biofeedback;Energy conservation;Building services engineer;Therapeutic activities;Balance training;Passive range of motion;Manual Therapy;DME and/or AE instruction;Ultrasound;Cryotherapy   Plan Weight-bearing through R hand/UE for NMR (emphasis on elbow extension, NMES?) Continue w/ functional reach (reaching to place resistance clothespins)   OT Home Exercise Plan AROM of scapular elevation/shoulder shrug; scapular retraction w/ elbows flexed; upper trap stretch / levator scapular stretch; chest press in supine; elbow extension w/ dumbbell while seated or w/ ball in supine; gross grasp, lateral pinch, and pinch-and-pull w/ putty    Recommended Other Services Pt currently receiving PT exercises at this location    Consulted and Agree with Plan of Care Patient; Family member/caregiver   Family Member Consulted Kim (daughter)          Patient will benefit from skilled therapeutic intervention in order to improve the following deficits and impairments:   Body Structure / Function / Physical Skills: ADL, ROM, UE functional use, Mobility, FMC, Decreased knowledge of use of DME, Balance, Body mechanics, Dexterity, Gait, Vision, GMC, Strength, Endurance, Coordination, IADL, Tone   Visit Diagnosis: 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)  Other lack of coordination  Muscle weakness (generalized)   Problem List Patient Active Problem List   Diagnosis Date Noted   Spastic hemiplegia of right dominant side as late effect of cerebral infarction (HCC) 07/11/2021   Other hyperlipidemia 07/06/2021   Brainstem infarct, acute (HCC) 03/01/2021   Acute cerebral infarction (HCC) 02/24/2021   Thyroid nodule 02/24/2021   Diabetes (HCC) 02/24/2021    Rosie Fate, OTR/L, MSOT 08/10/2021,  7:50 PM  [facility] [address] [place], [place], [address] Phone: [phone]   Fax:  [phone]  Name: Carrie Kline MRN: [id number] Date of Birth: Jan 24, 1946

## 2021-08-15 ENCOUNTER — Ambulatory Visit: Payer: Medicare Other | Admitting: Physical Therapy

## 2021-08-15 ENCOUNTER — Ambulatory Visit: Payer: Medicare Other | Admitting: Occupational Therapy

## 2021-08-17 ENCOUNTER — Ambulatory Visit: Payer: Medicare Other | Admitting: Occupational Therapy

## 2021-08-17 ENCOUNTER — Ambulatory Visit: Payer: Medicare Other | Admitting: Physical Therapy

## 2021-08-22 ENCOUNTER — Ambulatory Visit: Payer: Medicare Other | Admitting: Physical Therapy

## 2021-08-22 ENCOUNTER — Encounter: Payer: Self-pay | Admitting: Occupational Therapy

## 2021-08-22 ENCOUNTER — Other Ambulatory Visit: Payer: Self-pay

## 2021-08-22 ENCOUNTER — Ambulatory Visit: Payer: Medicare Other | Admitting: Occupational Therapy

## 2021-08-22 DIAGNOSIS — R2681 Unsteadiness on feet: Secondary | ICD-10-CM

## 2021-08-22 DIAGNOSIS — I69353 Hemiplegia and hemiparesis 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: Secondary | ICD-10-CM

## 2021-08-22 DIAGNOSIS — M6281 Muscle weakness (generalized): Secondary | ICD-10-CM

## 2021-08-22 DIAGNOSIS — R278 Other lack of coordination: Secondary | ICD-10-CM

## 2021-08-22 NOTE — Therapy (Signed)
[place]. [place], [place], [address] Phone: [phone]   Fax:  [phone]  Physical Therapy Treatment  Patient Details  Name: Carrie Kline MRN: [id number] Date of Birth: [birth year] Referring Provider (PT): Cathlyn Parsons, PA-C   Encounter Date: 08/22/2021   PT End of Session - 08/22/21 1411     Visit Number 27    Number of Visits 38    Date for PT Re-Evaluation 09/21/21    Authorization Type BCBS and Medicaid    PT Start Time 1405    PT Stop Time 1445    PT Time Calculation (min) 40 min    Activity Tolerance Patient tolerated treatment well;Patient limited by fatigue    Behavior During Therapy [facility] for tasks assessed/performed             Past Medical History:  Diagnosis Date   Gout    Seasonal allergies    Stroke [facility])     Past Surgical History:  Procedure Laterality Date   ABDOMINAL HYSTERECTOMY     BACK SURGERY     GANGLION CYST EXCISION      There were no vitals filed for this visit.   Subjective Assessment - 08/22/21 1412     Subjective Pt states she had a bad sinus infection and she is still recovering from it.    Patient is accompained by: Family member    Pertinent History gout, back surgery    Limitations Walking;House hold activities;Lifting;Standing    How long can you stand comfortably? ~3 min    How long can you walk comfortably? 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      Lumbar Exercises: Aerobic   Stationary Bike L3.0 x 6 minutes      Lumbar Exercises: Standing   Other Standing Lumbar Exercises Forward tap 2x10; lateral tap 2x10    Other Standing Lumbar Exercises weighted yellow ball diagonals x5 in each direction      Lumbar Exercises: Seated   Sit to Stand 10 reps   1st set no weight; 2nd set with yellow weighted ball                      PT Short Term Goals - 08/10/21 1439       PT SHORT TERM GOAL #1   Period Weeks      PT SHORT TERM GOAL #2   Title Patient will be I with updated HEP    Baseline Program was deferred during flu.    Time 1    Period Weeks    Status On-going  Target Date 08/24/21               PT Long Term Goals - 08/10/21 1441       PT LONG TERM GOAL #1   Title Patient to be independent with advanced HEP.    Time 6    Period Weeks    Status Partially Met    Target Date 09/21/21      PT LONG TERM GOAL #2   Title Patient to demonstrate R LE strength >/=4/5.    Baseline 4-/5 grossly    Time 6    Period Weeks    Status On-going    Target Date 09/21/21      PT LONG TERM GOAL #3   Title Patient to complete TUG in <14 sec with LRAD in order to decrease risk of falls.    Baseline 18 sec with cane    Time 6    Period Weeks    Status On-going    Target Date 09/21/21      PT LONG TERM GOAL #4   Title Patient to complete 5xSTS in <15 sec in order to decrease risk of falls.    Period Weeks    Status Achieved      PT LONG TERM GOAL #5   Title Patient to score atleast 47/56 on [place] in order to decrease risk of falls.    Baseline 49/56    Status Achieved                   Plan - 08/22/21 1621     Clinical Impression Statement Pt required more rest breaks this session; however, is returning back to her baseline prior to multiple illnesses.  Continues to have limited endurance. Working to improve balance, weight shift and stability. Worked on ambulating without cane; pt with difficulty maintaining hip extension during R LE weightbearing.    Personal Factors and Comorbidities Comorbidity 2;Age;Time since onset of injury/illness/exacerbation;Past/Current Experience    Examination-Activity Limitations Bathing;Transfers;Locomotion Level;Bed Mobility;Bend;Sit;Caring for Others;Carry;Squat;Stairs;Stand;Toileting;Lift;Hygiene/Grooming;Dressing    Examination-Participation Restrictions Church;Meal Prep;Cleaning;Community Activity;Shop;Laundry    Stability/Clinical Decision Making Evolving/Moderate complexity    Rehab Potential Good    PT Frequency 2x / week    PT Duration 6 weeks    PT Treatment/Interventions ADLs/Self Care Home Management;Cryotherapy;DME Instruction;Electrical Stimulation;Ultrasound;Moist Heat;Gait training;Stair training;Functional mobility training;Therapeutic activities;Therapeutic exercise;Balance training;Neuromuscular re-education;Patient/family education;Orthotic Fit/Training;Manual techniques;Splinting;Energy conservation;Dry needling;Passive range of motion;Taping;Vasopneumatic Device;Visual/perceptual remediation/compensation    PT Next Visit Plan Progress with strength, blance, activity tolerance as tolerated. Work on gait without a/d short distances.    PT Home Exercise Plan [facility]    Consulted and Agree with Plan of Care Patient             Patient will benefit from skilled therapeutic intervention in order to improve the following deficits and impairments:  Abnormal gait, Decreased coordination, Decreased range of motion, Difficulty walking, Increased fascial restricitons, Increased muscle spasms, Decreased safety awareness, Decreased activity tolerance, Improper body mechanics, Impaired flexibility, Decreased balance, Postural dysfunction, Decreased strength, Decreased mobility  Visit Diagnosis: Hemiplegia  and hemiparesis 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 (HCC)  Other lack of coordination  Muscle weakness (generalized)  Unsteadiness on feet     Problem List Patient Active Problem List   Diagnosis Date Noted   Spastic hemiplegia of right dominant side as late effect of cerebral infarction ([place]) 07/11/2021   Other hyperlipidemia 07/06/2021   Brainstem infarct, acute ([place]) 03/01/2021   Acute cerebral infarction ([place]) 02/24/2021   Thyroid nodule  02/24/2021   Diabetes ([place]) 02/24/2021    Reno Clasby April Gordy Levan, PT, DPT 08/22/2021, 4:36 PM  [place] [place], [place], [address] Phone: [phone]   Fax:  [phone]  Name: Carrie Kline MRN: [id number] Date of Birth: 04-28-1946

## 2021-08-22 NOTE — Therapy (Signed)
[facility] [place] [address] [place], [place], [address] Phone: [phone]   Fax:  [phone]  Occupational Therapy Treatment & Recertification  Patient Details  Name: Carrie Kline MRN: [id number] Date of Birth: 21-Sep-1945 Referring Provider (OT): Mariam Dollar, PA-C   Encounter Date: 08/22/2021   OT End of Session - 08/22/21 1504     Visit Number 27    Number of Visits 30   Initial 26 + 4 additional visits (1x/week)   Date for OT Re-Evaluation 09/23/21    Authorization Type BCBS Medicare (primary); Medicaid [place] (secondary)    Authorization Time Period VL: [place]    Progress Note Due on Visit 30    OT Start Time 1447    OT Stop Time 1530    OT Time Calculation (min) 43 min    Activity Tolerance Patient tolerated treatment well    Behavior During Therapy WFL for tasks assessed/performed            Past Medical History:  Diagnosis Date   Gout    Seasonal allergies    Stroke [facility])     Past Surgical History:  Procedure Laterality Date   ABDOMINAL HYSTERECTOMY     BACK SURGERY     GANGLION CYST EXCISION      There were no vitals filed for this visit.   Subjective Assessment - 08/22/21 1502     Subjective  Pt reports she was sick again last week with a sinus headache and is still feeling fatigued, but is ready to be back in therapy    Pertinent History CVA w/ R-sided weakness on 02/24/21; Hx of gout and back surgery    Patient Stated Goals "Moving my R arm"    Currently in Pain? No/denies             Treatment/Exercises - 08/22/21    Grip Strengthening  Picking up 1" blocks w/ R hand using power grip exerciser (yellow coil; 10 lbs) and placing in a container to facilitate FMC, functional grasp/release, proprioceptive input, and hand strengthening; pt completed 10 blocks w/ multiple drops and required mod verbal cues for pacing, occasional stabilization of power grip exerciser, and occasional assist w/  problem-solving for success     Pinch and Release Picking up 1" blocks off tabletop using R hand precision pinch prehension and placing in a stack with min cues for slow/controlled movements and to decrease compensatory shoulder elevation; able to complete 15 blocks w/ extended time for processing    Pinch Strengthening Using resistance clothespin (yellow, 1 lb) to retrieve marbles from pegboard and return back to container; activity facilitated palmar pinch/intrinsic hand strengthening and increased control/coordination. Pt completed 15 marbles w/ >5 drops and required min verbal cues for pacing.            OT Short Term Goals - 08/23/21 1700       OT SHORT TERM GOAL #1   Title Pt will improve independence w/ functional FM tasks (e.g., clothing manipulatives) as evidenced by being able to complete 9HPT w/ R hand    Baseline Completed 4 pegs in >1 min w R hand    Time 4    Period Weeks    Status Achieved   04/20/21 - 1 min, 23 sec   Target Date 04/28/21      OT SHORT TERM GOAL #2   Title Pt will improve AROM of R shoulder scaption by at least 20 degrees to facilitate improved functional reach  Revised goal from achieving shoulder flexion to scaption due to focus on function   Baseline R shoulder scaption 28 degrees    Time 4    Period Weeks    Status Achieved   06/06/21 - 96* of scaption w/ difficulty attaining true flexion beyond chest height; 05/02/21 - 52* of scaption     OT SHORT TERM GOAL #3   Title Pt will improve AROM of R elbow extension by at least 15 degrees to facilitate improved functional reach    Baseline Achieves 52 degrees of flexion when attempting elbow extension    Time 4    Period Weeks    Status Achieved   04/18/21 - 21* of flex w/ elbow ext     OT SHORT TERM GOAL #4   Title Pt will increase R hand grip strength by at least 4 lbs to improve independence w/ IADLs    Baseline 04/25/21 - RUE 4# (LUE 39#)    Time 4    Period Weeks    Status Achieved   08/23/21 -  15 lbs; 06/06/21 - 6 lbs w/ RUE            OT Long Term Goals - 08/22/21 1702       OT LONG TERM GOAL #1   Title Pt will be able to cut food w/ Mod I, using AE/compensatory strategies prn, 100% of the time    Baseline Frequently requires assist cutting food    Time 8    Period Weeks    Status Achieved   05/04/21     OT LONG TERM GOAL #2   Title Pt will be able to fasten/unfasten clothing manipulatives in 5/5 trials w/ Mod I, using AE prn    Baseline Requires assist w/ fasteners    Time 8    Period Weeks    Status Achieved   05/04/21     OT LONG TERM GOAL #3   Title Pt will demonstrate simulated tub/shower transfer safely w/ Mod I    Baseline Min A w/ tub/shower transfer    Time 8    Period Weeks    Status Achieved   06/13/21 - per pt report     OT LONG TERM GOAL #4   Title Pt will demonstrate independence w/ HEP designed for coordination, ROM, strength, and functional use of RUE by d/c    Baseline No HEP at this time    Time 8    Period Weeks    Status Achieved   07/18/21 - reports consistent carryover of exercises     OT LONG TERM GOAL #5   Title Pt will be able to safely place/retrieve medium-weight object (approx 5 lbs) at an overhead height w/ BUEs    Baseline Decreased RUE ROM/strength impacting functional reach    Time 4    Period Weeks    Status On-going    Target Date 09/23/21      OT LONG TERM GOAL #6   Title Pt will demonstrate improved RUE GMC as evidenced by increasing Box and Blocks score to at least 52 blocks    Baseline LUE 68; RUE 47    Time 4    Period Weeks    Status On-going    Target Date 09/23/21             Plan - 08/22/21 1523     Clinical Impression Statement Pt dicussed she is going to need to decrease frequency of OT to 1x/week starting this  week due to transportation conflicts. Due to this, OT completed recertification this session, reasssessing grip strength, AROM, and GM and FM control and coordination. Progress and POC moving  forward, including pt's functional goals, discussed w/ pt. Pt continues to demonstrate improvements in grip strength and control of pinch and release w/ grip strength of R hand increasing by 9 lbs since previous measurement. Also compared w/ previous attempts, pt was able to manipulate and control resistance clothepins and power grip exerciser, indicating overall improvement in GMC and hand strength. Pt will continue to benefit from skilled OT services to maximize therapy potential and functional use of RUE during bilateral tasks as pt continues to experience difficulty w/ shoulder AROM and functional overhead reach, decreased grip strength compared to LUE (dominant side), and FMC and dexterity.    OT Occupational Profile and History Detailed Assessment- Review of Records and additional review of physical, cognitive, psychosocial history related to current functional performance    Occupational performance deficits (Please refer to evaluation for details): ADL's;IADL's;Work;Leisure;Social Participation    Body Structure / Function / Physical Skills ADL;ROM;UE functional use;Mobility;FMC;Decreased knowledge of use of DME;Balance;Body mechanics;Dexterity;Gait;Vision;GMC;Strength;Endurance;Coordination;IADL;Tone    Rehab Potential Good    Clinical Decision Making Several treatment options, min-mod task modification necessary    Comorbidities Affecting Occupational Performance: May have comorbidities impacting occupational performance    Modification or Assistance to Complete Evaluation  Min-Moderate modification of tasks or assist with assess necessary to complete eval    OT Frequency 2x / week    OT Duration 8 weeks    OT Treatment/Interventions Self-care/ADL training;Electrical Stimulation;Therapeutic exercise;Visual/perceptual remediation/compensation;Patient/family education;Splinting;Neuromuscular education;Moist Heat;Aquatic Therapy;Biofeedback;Energy conservation;Marine scientist;Therapeutic activities;Balance training;Passive range of motion;Manual Therapy;DME and/or AE instruction;Ultrasound;Cryotherapy    Plan Weight-bearing through R hand/UE for NMR (emphasis on elbow extension); continue w/ functional reach using resistance clothespins    OT Home Exercise Plan AROM of scapular elevation/shoulder shrug; scapular retraction w/ elbows flexed; upper trap stretch / levator scapular stretch; chest press in supine; elbow extension w/ dumbbell while seated or w/ ball in supine; gross grasp, lateral pinch, and pinch-and-pull w/ putty    Recommended Other Services Pt currently receiving PT exercises at this location    Consulted and Agree with Plan of Care Patient;Family member/caregiver    Family Member Consulted Kim (daughter)            Patient will benefit from skilled therapeutic intervention in order to improve the following deficits and impairments:   Body Structure / Function / Physical Skills: ADL, ROM, UE functional use, Mobility, FMC, Decreased knowledge of use of DME, Balance, Body mechanics, Dexterity, Gait, Vision, GMC, Strength, Endurance, Coordination, IADL, Tone   Visit Diagnosis: 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)  Other lack of coordination  Muscle weakness (generalized)  Unsteadiness on feet   Problem List Patient Active Problem List   Diagnosis Date Noted   Spastic hemiplegia of right dominant side as late effect of cerebral infarction (HCC) 07/11/2021   Other hyperlipidemia 07/06/2021   Brainstem infarct, acute (HCC) 03/01/2021   Acute cerebral infarction (HCC) 02/24/2021   Thyroid nodule 02/24/2021   Diabetes (HCC) 02/24/2021    Rosie Fate, MSOT, OTR/L 08/22/2021, 4:12 PM  [facility] [place] [address] [place], [place], [address] Phone: [phone]   Fax:  [phone]  Name: Carrie Kline MRN: [id number] Date  of Birth: [birth year]

## 2021-08-24 ENCOUNTER — Ambulatory Visit: Payer: Medicare Other | Admitting: Physical Therapy

## 2021-08-24 ENCOUNTER — Ambulatory Visit: Payer: Medicare Other | Admitting: Occupational Therapy

## 2021-08-28 ENCOUNTER — Other Ambulatory Visit: Payer: Self-pay

## 2021-08-28 ENCOUNTER — Encounter (HOSPITAL_BASED_OUTPATIENT_CLINIC_OR_DEPARTMENT_OTHER): Payer: Self-pay | Admitting: Emergency Medicine

## 2021-08-28 ENCOUNTER — Emergency Department (HOSPITAL_BASED_OUTPATIENT_CLINIC_OR_DEPARTMENT_OTHER)
Admission: EM | Admit: 2021-08-28 | Discharge: 2021-08-28 | Disposition: A | Discharge status: Home / Self Care | Payer: Medicare Other | Attending: Emergency Medicine | Admitting: Emergency Medicine

## 2021-08-28 ENCOUNTER — Emergency Department (HOSPITAL_BASED_OUTPATIENT_CLINIC_OR_DEPARTMENT_OTHER): Payer: Medicare Other

## 2021-08-28 DIAGNOSIS — J45909 Unspecified asthma, uncomplicated: Secondary | ICD-10-CM | POA: Diagnosis not present

## 2021-08-28 DIAGNOSIS — U071 COVID-19: Secondary | ICD-10-CM | POA: Insufficient documentation

## 2021-08-28 DIAGNOSIS — J019 Acute sinusitis, unspecified: Secondary | ICD-10-CM | POA: Diagnosis not present

## 2021-08-28 DIAGNOSIS — Z7902 Long term (current) use of antithrombotics/antiplatelets: Secondary | ICD-10-CM | POA: Insufficient documentation

## 2021-08-28 DIAGNOSIS — R059 Cough, unspecified: Secondary | ICD-10-CM | POA: Diagnosis present

## 2021-08-28 LAB — CBC WITH DIFFERENTIAL/PLATELET
Abs Immature Granulocytes: 0.01 10*3/uL (ref 0.00–0.07)
Basophils Absolute: 0 10*3/uL (ref 0.0–0.1)
Basophils Relative: 0 %
Eosinophils Absolute: 0.2 10*3/uL (ref 0.0–0.5)
Eosinophils Relative: 2 %
HCT: 36.3 % (ref 36.0–46.0)
Hemoglobin: 12.1 g/dL (ref 12.0–15.0)
Immature Granulocytes: 0 %
Lymphocytes Relative: 18 %
Lymphs Abs: 1.3 10*3/uL (ref 0.7–4.0)
MCH: 28.9 pg (ref 26.0–34.0)
MCHC: 33.3 g/dL (ref 30.0–36.0)
MCV: 86.8 fL (ref 80.0–100.0)
Monocytes Absolute: 0.4 10*3/uL (ref 0.1–1.0)
Monocytes Relative: 5 %
Neutro Abs: 5.4 10*3/uL (ref 1.7–7.7)
Neutrophils Relative %: 75 %
Platelets: 175 10*3/uL (ref 150–400)
RBC: 4.18 MIL/uL (ref 3.87–5.11)
RDW: 13.2 % (ref 11.5–15.5)
WBC: 7.3 10*3/uL (ref 4.0–10.5)
nRBC: 0 % (ref 0.0–0.2)

## 2021-08-28 LAB — COMPREHENSIVE METABOLIC PANEL
ALT: 26 U/L (ref 0–44)
AST: 22 U/L (ref 15–41)
Albumin: 3.5 g/dL (ref 3.5–5.0)
Alkaline Phosphatase: 81 U/L (ref 38–126)
Anion gap: 10 (ref 5–15)
BUN: 20 mg/dL (ref 8–23)
CO2: 21 mmol/L — ABNORMAL LOW (ref 22–32)
Calcium: 9.2 mg/dL (ref 8.9–10.3)
Chloride: 108 mmol/L (ref 98–111)
Creatinine, Ser: 0.82 mg/dL (ref 0.44–1.00)
GFR, Estimated: >60 mL/min (ref >60)
Glucose, Bld: 131 mg/dL — ABNORMAL HIGH (ref 70–99)
Potassium: 3.6 mmol/L (ref 3.5–5.1)
Sodium: 139 mmol/L (ref 135–145)
Total Bilirubin: 0.6 mg/dL (ref 0.3–1.2)
Total Protein: 7.7 g/dL (ref 6.5–8.1)

## 2021-08-28 LAB — RESP PANEL BY RT-PCR (FLU A&B, COVID) ARPGX2
Influenza A by PCR: NEGATIVE
Influenza B by PCR: NEGATIVE
SARS Coronavirus 2 by RT PCR: POSITIVE — AB

## 2021-08-28 LAB — TROPONIN I (HIGH SENSITIVITY): Troponin I (High Sensitivity): 4 ng/L (ref <18)

## 2021-08-28 MED ORDER — FLUTICASONE PROPIONATE 50 MCG/ACT NA SUSP: 1.0000 | Freq: Every day | NASAL | 0 refills | Status: DC

## 2021-08-28 MED ORDER — ACETAMINOPHEN 325 MG PO TABS
650.0000 mg | ORAL_TABLET | Freq: Once | ORAL | Status: AC
  Administered 2021-08-28: 650 mg via ORAL
  Filled 2021-08-28: qty 2

## 2021-08-28 MED ORDER — SODIUM CHLORIDE 0.9 % IV BOLUS
500.0000 mL | Freq: Once | INTRAVENOUS | Status: AC
  Administered 2021-08-28: 500 mL via INTRAVENOUS

## 2021-08-28 MED ORDER — AMOXICILLIN-POT CLAVULANATE 875-125 MG PO TABS: 1.0000 | ORAL_TABLET | Freq: Two times a day (BID) | ORAL | 0 refills | Status: DC

## 2021-08-28 NOTE — ED Provider Notes (Signed)
[facility] EMERGENCY DEPARTMENT Provider Note   CSN: [id number] Arrival date & time: 08/28/21  [id number]     History  Chief Complaint  Patient presents with   sinus drainage   Vomiting    Carrie Kline is a 76 y.o. female.  Patient with a history of previous stroke and right-sided weakness here with lingering cough since she had the flu in early December.  States for the past 1 day she has had increased sinus drainage causing her to cough until she vomits.  Has had several episodes of posttussive emesis.  Denies fever.  States she is having a lot of clear nasal discharge and postnasal drip that goes down the back of her throat.  Does have some pain to her sinuses.  No difficulty breathing.  Does have some chest pain with coughing. Her chest pain is not exertional or pleuritic. History of asthma that is normally well controlled with albuterol.  No abdominal pain.  No worsening in her right-sided weakness.  She has been using over-the-counter medications without relief.  Feels similar to previous sinus infections and is concerned that she needs antibiotics.  The history is provided by the patient.      Home Medications Prior to Admission medications   Medication Sig Start Date End Date Taking? Authorizing Provider  atorvastatin (LIPITOR) 40 MG tablet Take 1 tablet (40 mg total) by mouth daily. 07/05/21 08/04/21  Dorna Mai, MD  clopidogrel (PLAVIX) 75 MG tablet Take 1 tablet (75 mg total) by mouth daily. 08/01/21   Frann Rider, NP      Allergies    Codeine, Darvon [propoxyphene], Hydrocodone, and Hydrocortisone    Review of Systems   Review of Systems  Constitutional:  Negative for activity change, appetite change and fatigue.  HENT:  Positive for congestion, postnasal drip, sinus pressure, sinus pain and sore throat. Negative for trouble swallowing.   Respiratory:  Negative for cough and shortness of breath.   Cardiovascular:  Negative for chest pain.  Gastrointestinal:   Negative for abdominal pain, nausea and vomiting.  Genitourinary:  Negative for dysuria, hematuria and vaginal bleeding.  Musculoskeletal:  Negative for arthralgias, gait problem and myalgias.  Skin:  Negative for rash.  Neurological:  Positive for weakness and headaches.   all other systems are negative except as noted in the HPI and PMH.   Physical Exam Updated Vital Signs BP (!) 170/90 (BP Location: Right Arm)    Pulse (!) 107    Temp 98.9 F (37.2 C) (Oral)    Resp 20    Ht 5' (1.524 m)    Wt 86.2 kg    SpO2 99%    BMI 37.11 kg/m  Physical Exam Vitals and nursing note reviewed.  Constitutional:      General: She is not in acute distress.    Appearance: She is well-developed.  HENT:     Head: Normocephalic and atraumatic.     Nose: Congestion and rhinorrhea present.     Mouth/Throat:     Pharynx: No oropharyngeal exudate.     Comments: No areas of asymmetry or exudate. No frontal or maxillary sinus tenderness Eyes:     Conjunctiva/sclera: Conjunctivae normal.     Pupils: Pupils are equal, round, and reactive to light.  Neck:     Comments: No meningismus. Cardiovascular:     Rate and Rhythm: Normal rate and regular rhythm.     Heart sounds: Normal heart sounds. No murmur heard. Pulmonary:     Effort: Pulmonary  effort is normal. No respiratory distress.     Breath sounds: Normal breath sounds.  Chest:     Chest wall: No tenderness.  Abdominal:     Palpations: Abdomen is soft.     Tenderness: There is no abdominal tenderness. There is no guarding or rebound.  Musculoskeletal:        General: No tenderness. Normal range of motion.     Cervical back: Normal range of motion and neck supple.  Skin:    General: Skin is warm.  Neurological:     Mental Status: She is alert and oriented to person, place, and time.     Cranial Nerves: No cranial nerve deficit.     Motor: No abnormal muscle tone.     Coordination: Coordination normal.     Comments: Right-sided weakness at  baseline  Psychiatric:        Behavior: Behavior normal.    ED Results / Procedures / Treatments   Labs (all labs ordered are listed, but only abnormal results are displayed) Labs Reviewed  RESP PANEL BY RT-PCR (FLU A&B, COVID) ARPGX2 - Abnormal; Notable for the following components:      Result Value   SARS Coronavirus 2 by RT PCR POSITIVE (*)    All other components within normal limits  COMPREHENSIVE METABOLIC PANEL - Abnormal; Notable for the following components:   CO2 21 (*)    Glucose, Bld 131 (*)    All other components within normal limits  CBC WITH DIFFERENTIAL/PLATELET  TROPONIN I (HIGH SENSITIVITY)    EKG EKG Interpretation  Date/Time:  Monday August 28 2021 04:43:04 EST Ventricular Rate:  99 PR Interval:  142 QRS Duration: 83 QT Interval:  347 QTC Calculation: 446 R Axis:   34 Text Interpretation: Sinus rhythm Abnormal R-wave progression, early transition No significant change was found Confirmed by Ezequiel Essex [phone]) on 08/28/2021 4:51:26 AM  Radiology DG Chest 2 View  Result Date: 08/28/2021 CLINICAL DATA:  Cough EXAM: CHEST - 2 VIEW COMPARISON:  10/31/2017 FINDINGS: Normal heart size and mediastinal contours. No acute infiltrate or edema. No effusion or pneumothorax. No acute osseous findings. IMPRESSION: Negative chest. Electronically Signed   By: Jorje Guild M.D.   On: 08/28/2021 04:12    Procedures Procedures    Medications Ordered in ED Medications - No data to display  ED Course/ Medical Decision Making/ A&P                           Medical Decision Making  Sinus drainage for the past 1 day with cough ongoing for the past 1 month.  No difficulty breathing  COVID test is positive.  No hypoxia or increased work of breathing.  Chest x-ray is negative.  Chest x-ray is negative.  This was personally reviewed by me.  Labs were obtained to evaluate for kidney function which is normal.  However patient declines antiviral medications for  COVID due to side effects.  She is not hypoxic and she is ambulatory without desaturation.  No indication for steroids. HR has improved to 90s. No hypoxia or increased work of breathing.   Discussed p.o. hydration at home, antipyretics, quarantine precautions for total of 5 days from symptom start. She is still concerned for recurrent sinus infection and will be given course of antibiotics and nasal steroids to assist with her nasal drainage.   Return to the ED with difficulty breathing, chest pain, inability to eat and drink, persistent fevers  or any other concerns.   Carrie Kline was evaluated in Emergency Department on 08/28/2021 for the symptoms described in the history of present illness. She was evaluated in the context of the global COVID-19 pandemic, which necessitated consideration that the patient might be at risk for infection with the SARS-CoV-2 virus that causes COVID-19. Institutional protocols and algorithms that pertain to the evaluation of patients at risk for COVID-19 are in a state of rapid change based on information released by regulatory bodies including the CDC and federal and state organizations. These policies and algorithms were followed during the patient's care in the ED.         Final Clinical Impression(s) / ED Diagnoses Final diagnoses:  COVID-19  Acute sinusitis, recurrence not specified, unspecified location    Rx / DC Orders ED Discharge Orders     None         Luetta Piazza, Annie Main, MD 08/28/21 1858

## 2021-08-28 NOTE — Discharge Instructions (Signed)
Your COVID test is positive. You declined antiviral medications. Take tylenol or ibuprofen as needed for aches and fever. Keep yourself quarantined for a total of 5 days from symptom onset. Return to the ED with chest pain, shortness or breath, persistent fevers, inability to eat or drink, or any other concerns.

## 2021-08-28 NOTE — ED Notes (Signed)
Ambulated in hallway.  O2 sat remained 94-95%.  Tolerated well.

## 2021-08-28 NOTE — ED Triage Notes (Signed)
Reports having a lot of sinus drainage since yesterday.  Had a lingering cough since the flu early in December.  Reports she coughs until she vomits.  Hx of asthma.

## 2021-08-31 ENCOUNTER — Ambulatory Visit: Payer: Medicare Other | Admitting: Physical Therapy

## 2021-08-31 ENCOUNTER — Ambulatory Visit: Payer: Medicare Other | Admitting: Occupational Therapy

## 2021-09-06 ENCOUNTER — Encounter: Payer: Self-pay | Admitting: Nurse Practitioner

## 2021-09-06 ENCOUNTER — Other Ambulatory Visit: Payer: Self-pay | Admitting: Nurse Practitioner

## 2021-09-06 ENCOUNTER — Ambulatory Visit (INDEPENDENT_AMBULATORY_CARE_PROVIDER_SITE_OTHER): Payer: Medicare Other | Admitting: Nurse Practitioner

## 2021-09-06 ENCOUNTER — Other Ambulatory Visit: Payer: Self-pay

## 2021-09-06 DIAGNOSIS — U071 COVID-19: Secondary | ICD-10-CM | POA: Insufficient documentation

## 2021-09-06 DIAGNOSIS — R112 Nausea with vomiting, unspecified: Secondary | ICD-10-CM

## 2021-09-06 MED ORDER — ONDANSETRON 4 MG PO TBDP: 4.0000 mg | ORAL_TABLET | Freq: Three times a day (TID) | ORAL | 0 refills | Status: DC | PRN

## 2021-09-06 MED ORDER — ALBUTEROL SULFATE HFA 108 (90 BASE) MCG/ACT IN AERS: 2.0000 | INHALATION_SPRAY | Freq: Four times a day (QID) | RESPIRATORY_TRACT | 0 refills | Status: DC | PRN

## 2021-09-06 MED ORDER — PANTOPRAZOLE SODIUM 40 MG PO TBEC: 40.0000 mg | DELAYED_RELEASE_TABLET | Freq: Every day | ORAL | 0 refills | Status: DC

## 2021-09-06 NOTE — Progress Notes (Signed)
Virtual Visit via Telephone Note  I connected with Carrie Kline on 09/06/21 at  1:00 PM EST by telephone and verified that I am speaking with the correct person using two identifiers.  Location: Patient: home Provider: office   I discussed the limitations, risks, security and privacy concerns of performing an evaluation and management service by telephone and the availability of in person appointments. I also discussed with the patient that there may be a patient responsible charge related to this service. The patient expressed understanding and agreed to proceed.   History of Present Illness:  Patient presents today for sick visit through telephone visit.  Patient was diagnosed with COVID on 08/28/2021.  She states that she continues to have some cough and postnasal drip.  Her main concern today is continued nausea and vomiting.  She states that she is overall improving but the nausea and vomiting is continuing.  She is trying to stay well-hydrated.  She does have a living call her in something for nausea to the pharmacy today.  She states that she does use an albuterol inhaler as needed and does need a refill on this today. Denies f/c/s, n/v/d, hemoptysis, PND, chest pain or edema.       Observations/Objective:  Vitals with BMI 08/28/2021 08/28/2021 08/28/2021  Height - - -  Weight - - -  BMI - - -  Systolic 134 134 -  Diastolic 81 81 -  Pulse 108 - 99      Assessment and Plan:  Covid 19 Nausea and vomiting:   Stay well hydrated  Stay active  Deep breathing exercises  May take tylenol or fever or pain  May take mucinex DM twice daily  Will order Zofran  Will order omeprazole  Will refill albuterol per patient request   Follow up:  Follow up in 2 weeks or sooner if needed    I discussed the assessment and treatment plan with the patient. The patient was provided an opportunity to ask questions and all were answered. The patient agreed with the plan and  demonstrated an understanding of the instructions.   The patient was advised to call back or seek an in-person evaluation if the symptoms worsen or if the condition fails to improve as anticipated.  I provided 23 minutes of non-face-to-face time during this encounter.   Ivonne Andrew, NP

## 2021-09-06 NOTE — Patient Instructions (Signed)
Covid 19 Nausea and vomiting:   Stay well hydrated  Stay active  Deep breathing exercises  May take tylenol or fever or pain  May take mucinex DM twice daily  Will order Zofran  Will order omeprazole  Will refill albuterol per patient request   Follow up:  Follow up in 2 weeks or sooner if needed

## 2021-09-07 ENCOUNTER — Ambulatory Visit: Payer: Medicare Other | Admitting: Physical Therapy

## 2021-09-07 ENCOUNTER — Ambulatory Visit: Payer: Medicare Other | Admitting: Occupational Therapy

## 2021-09-07 DIAGNOSIS — N3001 Acute cystitis with hematuria: Secondary | ICD-10-CM | POA: Insufficient documentation

## 2021-09-14 ENCOUNTER — Ambulatory Visit: Payer: Medicare Other | Admitting: Physical Therapy

## 2021-09-14 ENCOUNTER — Ambulatory Visit: Payer: Medicare Other | Admitting: Occupational Therapy

## 2021-09-15 ENCOUNTER — Ambulatory Visit: Payer: Medicare Other | Admitting: Physical Medicine & Rehabilitation

## 2021-09-21 ENCOUNTER — Other Ambulatory Visit: Payer: Self-pay

## 2021-09-21 ENCOUNTER — Encounter: Payer: Self-pay | Admitting: Occupational Therapy

## 2021-09-21 ENCOUNTER — Encounter: Payer: Self-pay | Admitting: Physical Therapy

## 2021-09-21 ENCOUNTER — Ambulatory Visit: Payer: Medicare Other | Admitting: Occupational Therapy

## 2021-09-21 ENCOUNTER — Ambulatory Visit: Payer: Medicare Other | Attending: Physician Assistant | Admitting: Physical Therapy

## 2021-09-21 DIAGNOSIS — R261 Paralytic gait: Secondary | ICD-10-CM | POA: Insufficient documentation

## 2021-09-21 DIAGNOSIS — I639 Cerebral infarction, unspecified: Secondary | ICD-10-CM | POA: Insufficient documentation

## 2021-09-21 DIAGNOSIS — M6281 Muscle weakness (generalized): Secondary | ICD-10-CM

## 2021-09-21 DIAGNOSIS — R2689 Other abnormalities of gait and mobility: Secondary | ICD-10-CM | POA: Insufficient documentation

## 2021-09-21 DIAGNOSIS — I69353 Hemiplegia and hemiparesis 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: Secondary | ICD-10-CM | POA: Diagnosis not present

## 2021-09-21 DIAGNOSIS — R2681 Unsteadiness on feet: Secondary | ICD-10-CM | POA: Diagnosis present

## 2021-09-21 DIAGNOSIS — R278 Other lack of coordination: Secondary | ICD-10-CM | POA: Diagnosis present

## 2021-09-21 NOTE — Therapy (Signed)
[place]. [place], [place], [address] Phone: [phone]   Fax:  [phone]  Physical Therapy Treatment  Patient Details  Name: Carrie Kline MRN: [id number] Date of Birth: 04-Mar-1946 Referring Provider (PT): Cathlyn Parsons, PA-C   Encounter Date: 09/21/2021   PT End of Session - 09/21/21 1453     Visit Number 28    Number of Visits 66    Date for PT Re-Evaluation 10/26/21    PT Start Time 1449    Behavior During Therapy WFL for tasks assessed/performed             Past Medical History:  Diagnosis Date   Gout    Seasonal allergies    Stroke [facility])     Past Surgical History:  Procedure Laterality Date   ABDOMINAL HYSTERECTOMY     BACK SURGERY     GANGLION CYST EXCISION      There were no vitals filed for this visit.       [facility] PT Assessment - 09/21/21 0001       Transfers   Five time sit to stand comments  14.38                           [facility] Adult PT Treatment/Exercise - 09/21/21 0001       Lumbar Exercises: Aerobic   Stationary Bike L4 x 7 minutes Paced for endurance.      Knee/Hip Exercises: Standing   Other Standing Knee Exercises Mini squats x 10 holding small physioball , with reach up on each rise.    Other Standing Knee Exercises B side steps against red T band resistance x 10 to each side.                       PT Short Term Goals - 09/21/21 1519       PT SHORT TERM GOAL #1   Title Patient to be independent with initial HEP.    Period Weeks      PT SHORT TERM GOAL #2   Title Patient will be I with updated HEP    Baseline Program was deferred during flu.    Time 1    Period Weeks    Status On-going    Target Date 08/24/21               PT Long Term Goals - 09/21/21 1454       PT LONG TERM GOAL #1   Title Patient to be independent with advanced HEP.    Time 5    Period Weeks    Status On-going    Target Date  10/26/21      PT LONG TERM GOAL #2   Baseline 3+/5    Time 5    Period Weeks    Status On-going    Target Date 10/26/21      PT LONG TERM GOAL #3   Title Patient to complete TUG in <14 sec with LRAD in order to decrease risk of falls.    Baseline 22.16 sec    Time 5    Period Weeks    Status On-going    Target Date 10/26/21      PT LONG TERM GOAL #4   Title Patient to complete 5xSTS in <15 sec in order to decrease risk of falls.    Baseline Re-assessed after hospitalization 14.38    Time  5    Period Weeks    Status Achieved                    Patient will benefit from skilled therapeutic intervention in order to improve the following deficits and impairments:     Visit Diagnosis: 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)  Other lack of coordination  Muscle weakness (generalized)  Unsteadiness on feet  Paralytic gait  Acute cerebral infarction [facility])     Problem List Patient Active Problem List   Diagnosis Date Noted   COVID-19 09/06/2021   Nausea and vomiting 09/06/2021   Spastic hemiplegia of right dominant side as late effect of cerebral infarction ([place]) 07/11/2021   Other hyperlipidemia 07/06/2021   Brainstem infarct, acute ([place]) 03/01/2021   Acute cerebral infarction ([place]) 02/24/2021   Thyroid nodule 02/24/2021   Diabetes ([place]) 02/24/2021    Marcelina Morel, DPT 09/21/2021, 3:21 PM  [place]. [place], [place], [address] Phone: [phone]   Fax:  [phone]  Name: Carrie Kline MRN: [id number] Date of Birth: 1946/04/06

## 2021-09-22 NOTE — Therapy (Signed)
[facility] [address] [place], [place], [address] Phone: [phone]   Fax:  [phone]  Occupational Therapy Treatment & Recertification  Patient Details  Name: Carrie Kline MRN: [id number] Date of Birth: 10-25-1945 Referring Provider (OT): Mariam Dollaraniel Angiulli, PA-C   Encounter Date: 09/21/2021   OT End of Session - 09/21/21 1540     Visit Number 28    Number of Visits 32   Initial 28 + 4 additional visits (1x/week)   Date for OT Re-Evaluation 10/26/21    Authorization Type BC[employer] Medicare (primary); Medicaid [place] (secondary)    Authorization Time Period VL: [place]    Progress Note Due on Visit 30    OT Start Time 1530    OT Stop Time 1610    OT Time Calculation (min) 40 min    Activity Tolerance Patient tolerated treatment well    Behavior During Therapy WFL for tasks assessed/performed            Past Medical History:  Diagnosis Date   Gout    Seasonal allergies    Stroke [facility](HCC)     Past Surgical History:  Procedure Laterality Date   ABDOMINAL HYSTERECTOMY     BACK SURGERY     GANGLION CYST EXCISION      There were no vitals filed for this visit.   Subjective Assessment - 09/21/21 1532     Subjective  "I was so sick." Pt reports she presented to the ED on 08/28/21 due to development of significant sinus drainage and persistent cough; COVID test was positive. States she was feeling okay before presenting again to the ED on 09/07/21 due to persistent nausea and vomiting, remained in the hospital for acute UTI and intractable n/v before being d/c home on 09/09/21. Pt reports she has been doing well aside from increased fatigue/decreased endurance and activity tolerance, as well as generalized weakness.    Pertinent History CVA w/ R-sided weakness on 02/24/21; Hx of gout and back surgery    Patient Stated Goals "Moving my R arm"    Currently in Pain? No/denies             [facility] - 09/21/21 1646        Coordination   Right 9 Hole Peg Test 55 sec    Box and Blocks 42 blocks      AROM   Right Shoulder Flexion 72 Degrees   72 degrees of scaption; continued difficulty achieving true flexion. Able to achieve 91* of flexion w/ closed-chain AAROM. Demonstrated R scapular elevation w/ shoulder ROM above chest height.   Right Elbow Flexion 122    Right Elbow Extension 44             OT Education - 09/21/21 1555     Education Details OT introduced energy conservation strategies relating to ADLs, providing examples specific to pt based on discussion of participation in daily activities and reviewed diaphramatic breathing; handout administered. OT also continued condition-specific education, particularly regarding typical recovery patterns and approaches to OT (i.e., restoration, compensation and modification) across the phases of recovery.    Person(s) Educated Patient    Methods Explanation;Handout    Comprehension Verbalized understanding             OT Short Term Goals - 09/21/21 1623       OT SHORT TERM GOAL #1   Title Pt will improve independence w/ functional FM tasks (e.g., clothing manipulatives) as evidenced by being able  to complete 9HPT w/ R hand    Baseline Completed 4 pegs in >1 min w R hand    Time 4    Period Weeks    Status Achieved   04/20/21 - 1 min, 23 sec   Target Date 04/28/21      OT SHORT TERM GOAL #2   Title Pt will improve AROM of R shoulder scaption by at least 20 degrees to facilitate improved functional reach   Revised goal from achieving shoulder flexion to scaption due to focus on function   Baseline R shoulder scaption 28 degrees    Time 4    Period Weeks    Status Achieved   06/06/21 - 96* of scaption w/ difficulty attaining true flexion beyond chest height; 05/02/21 - 52* of scaption     OT SHORT TERM GOAL #3   Title Pt will improve AROM of R elbow extension by at least 15 degrees to facilitate improved functional reach    Baseline Achieves 52  degrees of flexion when attempting elbow extension    Time 4    Period Weeks    Status Achieved   04/18/21 - 21* of flex w/ elbow ext     OT SHORT TERM GOAL #4   Title Pt will increase R hand grip strength by at least 4 lbs to improve independence w/ IADLs    Baseline 04/25/21 - RUE 4# (LUE 39#)    Time 4    Period Weeks    Status Achieved   08/23/21 - 15 lbs; 06/06/21 - 6 lbs w/ RUE     OT SHORT TERM GOAL #5   Title Pt will be able to demonstrate R shoulder scaption w/out compensatory scapular elevation to improve posture/alignment during functional reach    Baseline 72 degrees of scaption w/ compensatory elevation (09/21/21)    Time 4    Period Weeks    Status New    Target Date 10/26/21             OT Long Term Goals - 09/21/21 1619       OT LONG TERM GOAL #1   Title Pt will be able to cut food w/ Mod I, using AE/compensatory strategies prn, 100% of the time    Baseline Frequently requires assist cutting food    Time 8    Period Weeks    Status Achieved   05/04/21     OT LONG TERM GOAL #2   Title Pt will be able to fasten/unfasten clothing manipulatives in 5/5 trials w/ Mod I, using AE prn    Baseline Requires assist w/ fasteners    Time 8    Period Weeks    Status Achieved   05/04/21     OT LONG TERM GOAL #3   Title Pt will demonstrate simulated tub/shower transfer safely w/ Mod I    Baseline Min A w/ tub/shower transfer    Time 8    Period Weeks    Status Achieved   06/13/21 - per pt report     OT LONG TERM GOAL #4   Title Pt will demonstrate independence w/ HEP designed for coordination, ROM, strength, and functional use of RUE by d/c    Baseline No HEP at this time    Time 8    Period Weeks    Status Achieved   07/18/21 - reports consistent carryover of exercises     OT LONG TERM GOAL #5   Title Pt will be able  to use BUEs to safely place/retrieve medium-weight object (approx 5 lbs) above chest height    Baseline Decreased RUE ROM/strength impacting  functional reach    Time 8    Period Weeks    Status Revised    Target Date 11/17/21      OT LONG TERM GOAL #6   Title Pt will increase RUE grip strength by at least 4 lbs to improve safety/prevent drops when carrying objects while ambulating w/ cane    Baseline 11 lbs (09/21/21)   08/23/21 - 15 lbs   Time 8    Period Weeks    Status New    Target Date 11/17/21             Plan - 09/21/21 1546     Clinical Impression Statement Pt returns to OP therapies after most recent visit 4 weeks ago due to illness and short-term hospitalization. Per report, most notable limitations w/ functional activities is due to generalized weakness and fatigue/decreased activity tolerance. Considering this, OT introduced energy conservation strategies, providing applicable examples prn. OT then completed informal reassessment of RUE strength, ROM, and coordination. FMC is significantly improved w/ pt decreasing 9-HPT by 28 sec since prior reassessment, while GMC, AROM, and strength have each decreased slightly when compared to most recent measurements. Pt will benefit from continued skilled OT services to address these deficits and return to level of function prior to recent illness and hospital visit, as well as to improve activity tolerance and ease/efficiency of ADL completion,    OT Occupational Profile and History Detailed Assessment- Review of Records and additional review of physical, cognitive, psychosocial history related to current functional performance    Occupational performance deficits (Please refer to evaluation for details): ADL's;IADL's;Work;Leisure;Social Participation    Body Structure / Function / Physical Skills ADL;ROM;UE functional use;Mobility;FMC;Decreased knowledge of use of DME;Balance;Body mechanics;Dexterity;Gait;Vision;GMC;Strength;Endurance;Coordination;IADL;Tone    Rehab Potential Good    Clinical Decision Making Several treatment options, min-mod task modification necessary     Comorbidities Affecting Occupational Performance: May have comorbidities impacting occupational performance    Modification or Assistance to Complete Evaluation  Min-Moderate modification of tasks or assist with assess necessary to complete eval    OT Frequency 1x / week    OT Duration 8 weeks    OT Treatment/Interventions Self-care/ADL training;Electrical Stimulation;Therapeutic exercise;Visual/perceptual remediation/compensation;Patient/family education;Splinting;Neuromuscular education;Moist Heat;Aquatic Therapy;Biofeedback;Energy conservation;Building services engineer;Therapeutic activities;Balance training;Passive range of motion;Manual Therapy;DME and/or AE instruction;Ultrasound;Cryotherapy    Plan Upper trap/levator scap stretch on R side; closed-chain shoulder AAROM; elbow extension activation; grip strengthening; overall activity tolerance and endurance    OT Home Exercise Plan MedBridge Code: [email or web address] - AROM of scap elevation/shoulder shrug; scapular retract w/ elbows flex; upper trap stretch / levator scap stretch; chest press in supine; elbow ext w/ dumbbell or w/ ball in supine; gross grasp, lateral pinch, and pinch-and-pull w/ putty    Recommended Other Services Pt currently receiving PT exercises at this location    Consulted and Agree with Plan of Care Patient;Family member/caregiver    Family Member Consulted Kim (daughter)            Patient will benefit from skilled therapeutic intervention in order to improve the following deficits and impairments:   Body Structure / Function / Physical Skills: ADL, ROM, UE functional use, Mobility, FMC, Decreased knowledge of use of DME, Balance, Body mechanics, Dexterity, Gait, Vision, GMC, Strength, Endurance, Coordination, IADL, Tone   Visit Diagnosis: 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)  Other  lack of coordination  Muscle weakness (generalized)  Other abnormalities of  gait and mobility   Problem List Patient Active Problem List   Diagnosis Date Noted   COVID-19 09/06/2021   Nausea and vomiting 09/06/2021   Spastic hemiplegia of right dominant side as late effect of cerebral infarction (HCC) 07/11/2021   Other hyperlipidemia 07/06/2021   Brainstem infarct, acute (HCC) 03/01/2021   Acute cerebral infarction (HCC) 02/24/2021   Thyroid nodule 02/24/2021   Diabetes (HCC) 02/24/2021    Rosie Fate, MSOT, OTR/L 09/22/2021, 10:55 AM  [facility] [address] [place], [place], [address] Phone: [phone]   Fax:  [phone]  Name: Carrie Kline MRN: [id number] Date of Birth: 1945-11-07

## 2021-09-26 ENCOUNTER — Telehealth: Payer: Self-pay | Admitting: Family Medicine

## 2021-09-26 ENCOUNTER — Ambulatory Visit: Payer: Medicare Other | Admitting: Physical Therapy

## 2021-09-26 ENCOUNTER — Encounter: Payer: Self-pay | Admitting: Physical Therapy

## 2021-09-26 ENCOUNTER — Other Ambulatory Visit: Payer: Self-pay

## 2021-09-26 ENCOUNTER — Ambulatory Visit: Payer: Medicare Other | Admitting: Occupational Therapy

## 2021-09-26 DIAGNOSIS — I69353 Hemiplegia and hemiparesis 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: Secondary | ICD-10-CM

## 2021-09-26 DIAGNOSIS — M6281 Muscle weakness (generalized): Secondary | ICD-10-CM

## 2021-09-26 DIAGNOSIS — R2689 Other abnormalities of gait and mobility: Secondary | ICD-10-CM

## 2021-09-26 DIAGNOSIS — R261 Paralytic gait: Secondary | ICD-10-CM

## 2021-09-26 DIAGNOSIS — R278 Other lack of coordination: Secondary | ICD-10-CM

## 2021-09-26 DIAGNOSIS — R2681 Unsteadiness on feet: Secondary | ICD-10-CM

## 2021-09-26 NOTE — Telephone Encounter (Signed)
Pt's daughter asking if Dr. Andrey Campanile can help w/ the Disability form since her current one is about to expire and asked if she could be notified when it's ready for pick up.

## 2021-09-26 NOTE — Therapy (Signed)
[facility] [address] [place], [place], [address] Phone: [phone]   Fax:  [phone]  Physical Therapy Treatment  Patient Details  Name: Carrie Kline MRN: [id number] Date of Birth: 10-17-1945 Referring Provider (PT): Charlton Amor, PA-C   Encounter Date: 09/26/2021   PT End of Session - 09/26/21 1302     Visit Number 29    Number of Visits 38    Date for PT Re-Evaluation 10/26/21    PT Start Time 1225    PT Stop Time 1309    PT Time Calculation (min) 44 min    Activity Tolerance Patient tolerated treatment well;Patient limited by fatigue    Behavior During Therapy [facility] for tasks assessed/performed             Past Medical History:  Diagnosis Date   Gout    Seasonal allergies    Stroke [facility])     Past Surgical History:  Procedure Laterality Date   ABDOMINAL HYSTERECTOMY     BACK SURGERY     GANGLION CYST EXCISION      There were no vitals filed for this visit.   Subjective Assessment - 09/26/21 1225     Subjective Patient reports her sinus pain and pressure are improved, but she remains weak and fatigued from her Covid illness.    Pertinent History gout, back surgery    Limitations Walking;House hold activities;Lifting;Standing    Currently in Pain? No/denies                               [facility] Adult PT Treatment/Exercise - 09/26/21 0001       Lumbar Exercises: Aerobic   Nustep L5 x 8 mknutes.      Knee/Hip Exercises: Standing   Walking with Sports Cord 10# resistance. 3 reps in each direction, required rest breaks in between each set.    Other Standing Knee Exercises Alternate taps on 4" step, progressed from single taps to multiple, using Straight cane for support and CGA.    Other Standing Knee Exercises B side to side stepping quickly over Tband on the floor x 10 reps, stable.                       PT Short Term Goals - 09/21/21 1519       PT SHORT  TERM GOAL #1   Title Patient to be independent with initial HEP.    Period Weeks      PT SHORT TERM GOAL #2   Title Patient will be I with updated HEP    Baseline Program was deferred during flu.    Time 1    Period Weeks    Status On-going    Target Date 08/24/21               PT Long Term Goals - 09/21/21 1454       PT LONG TERM GOAL #1   Title Patient to be independent with advanced HEP.    Time 5    Period Weeks    Status On-going    Target Date 10/26/21      PT LONG TERM GOAL #2   Baseline 3+/5    Time 5    Period Weeks    Status On-going    Target Date 10/26/21      PT LONG TERM GOAL #3   Title Patient to complete  TUG in <14 sec with LRAD in order to decrease risk of falls.    Baseline 22.16 sec    Time 5    Period Weeks    Status On-going    Target Date 10/26/21      PT LONG TERM GOAL #4   Title Patient to complete 5xSTS in <15 sec in order to decrease risk of falls.    Baseline Re-assessed after hospitalization 14.38    Time 5    Period Weeks    Status Achieved                   Plan - 09/26/21 1234     Clinical Impression Statement Patient is still frustrated with her progress, but she does acknowledge progress. She performed multiple activities, focusing on functional strength, balance, and activity tolerance. She required rest breaks, but tolerated a full session of activity.    Personal Factors and Comorbidities Comorbidity 2;Age;Time since onset of injury/illness/exacerbation;Past/Current Experience    Examination-Activity Limitations Bathing;Transfers;Locomotion Level;Bed Mobility;Bend;Sit;Caring for Others;Carry;Squat;Stairs;Stand;Toileting;Lift;Hygiene/Grooming;Dressing    Stability/Clinical Decision Making Stable/Uncomplicated    Clinical Decision Making Moderate    Rehab Potential Good    PT Frequency 2x / week    PT Duration 3 weeks    PT Treatment/Interventions ADLs/Self Care Home Management;Cryotherapy;DME  Instruction;Electrical Stimulation;Ultrasound;Moist Heat;Gait training;Stair training;Functional mobility training;Therapeutic activities;Therapeutic exercise;Balance training;Neuromuscular re-education;Patient/family education;Orthotic Fit/Training;Manual techniques;Splinting;Energy conservation;Dry needling;Passive range of motion;Taping;Vasopneumatic Device;Visual/perceptual remediation/compensation    PT Next Visit Plan Continue to progress activities for strength, balanc,e activity tolerance.    PT Home Exercise Plan [facility]    Consulted and Agree with Plan of Care Patient             Patient will benefit from skilled therapeutic intervention in order to improve the following deficits and impairments:  Abnormal gait, Decreased coordination, Decreased range of motion, Difficulty walking, Increased fascial restricitons, Increased muscle spasms, Decreased safety awareness, Decreased activity tolerance, Improper body mechanics, Impaired flexibility, Decreased balance, Postural dysfunction, Decreased strength, Decreased mobility  Visit Diagnosis: 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)  Other lack of coordination  Muscle weakness (generalized)  Other abnormalities of gait and mobility  Unsteadiness on feet  Paralytic gait     Problem List Patient Active Problem List   Diagnosis Date Noted   COVID-19 09/06/2021   Nausea and vomiting 09/06/2021   Spastic hemiplegia of right dominant side as late effect of cerebral infarction (HCC) 07/11/2021   Other hyperlipidemia 07/06/2021   Brainstem infarct, acute (HCC) 03/01/2021   Acute cerebral infarction (HCC) 02/24/2021   Thyroid nodule 02/24/2021   Diabetes (HCC) 02/24/2021    Iona Beard, DPT 09/26/2021, 1:15 PM  [facility] [address] [place], [place], [address] Phone: [phone]   Fax:  [phone]  Name:  Carrie Kline MRN: [id number] Date of Birth: 09/18/1945

## 2021-09-26 NOTE — Therapy (Signed)
[facility] [address] [place], [place], [address] Phone: [phone]   Fax:  [phone]  Occupational Therapy Treatment  Patient Details  Name: Carrie Kline MRN: [id number] Date of Birth: 1945/09/11 Referring Provider (OT): Mariam Dollar, PA-C   Encounter Date: 09/26/2021   OT End of Session - 09/26/21 1331     Visit Number 29    Number of Visits 32   Initial 28 + 4 additional visits (1x/week)   Date for OT Re-Evaluation 10/26/21    Authorization Type BCBS Medicare (primary); Medicaid [place] (secondary)    Authorization Time Period VL: [place]    Progress Note Due on Visit 30    OT Start Time 1312    OT Stop Time 1352    OT Time Calculation (min) 40 min    Activity Tolerance Patient tolerated treatment well    Behavior During Therapy WFL for tasks assessed/performed            Past Medical History:  Diagnosis Date   Gout    Seasonal allergies    Stroke [facility])     Past Surgical History:  Procedure Laterality Date   ABDOMINAL HYSTERECTOMY     BACK SURGERY     GANGLION CYST EXCISION      There were no vitals filed for this visit.   Subjective Assessment - 09/26/21 1330     Subjective  Pt states she was able to open a can using a twisting can opener on Sunday!    Pertinent History CVA w/ R-sided weakness on 02/24/21; Hx of gout and back surgery    Patient Stated Goals "Moving my R arm"    Currently in Pain? No/denies             Treatment/Exercises - 09/26/21    Shoe Horn  OT continued discussion regarding level of participation/independence in ADLs at home. Introduced and demonstrated use of long-handle shoe horn to improve independence w/ donning R shoe w/ AFO on and pt was able to verbalize understanding    RUE ROM OT performed gentle passive stretching and PROM of R shoulder flexion to increase ROM w/ additional benefit of decreased joint stiffness and potential increase in motor function. Pt able to  achieve approx 100 degrees of true flexion w/out compensatory scapular elevation during PROM. Tolerated exercise w/out discomfort after first rep. PROM limited by stiffness, particularly of R upper trap; OT provided additional circular massage of upper trap for increased soft tissue mobility.            OT Short Term Goals - 09/26/21 1435       OT SHORT TERM GOAL #1   Title Pt will improve independence w/ functional FM tasks (e.g., clothing manipulatives) as evidenced by being able to complete 9HPT w/ R hand    Baseline Completed 4 pegs in >1 min w R hand    Time 4    Period Weeks    Status Achieved   04/20/21 - 1 min, 23 sec   Target Date 04/28/21      OT SHORT TERM GOAL #2   Title Pt will improve AROM of R shoulder scaption by at least 20 degrees to facilitate improved functional reach   Revised goal from achieving shoulder flexion to scaption due to focus on function   Baseline R shoulder scaption 28 degrees    Time 4    Period Weeks    Status Achieved   06/06/21 - 96* of scaption w/  difficulty attaining true flexion beyond chest height; 05/02/21 - 52* of scaption     OT SHORT TERM GOAL #3   Title Pt will improve AROM of R elbow extension by at least 15 degrees to facilitate improved functional reach    Baseline Achieves 52 degrees of flexion when attempting elbow extension    Time 4    Period Weeks    Status Achieved   04/18/21 - 21* of flex w/ elbow ext     OT SHORT TERM GOAL #4   Title Pt will increase R hand grip strength by at least 4 lbs to improve independence w/ IADLs    Baseline 04/25/21 - RUE 4# (LUE 39#)    Time 4    Period Weeks    Status Achieved   08/23/21 - 15 lbs; 06/06/21 - 6 lbs w/ RUE     OT SHORT TERM GOAL #5   Title Pt will be able to demonstrate R shoulder scaption w/out compensatory scapular elevation to improve posture/alignment during functional reach    Baseline 72 degrees of scaption w/ compensatory elevation (09/21/21)    Time 4    Period Weeks     Status On-going    Target Date 10/26/21             OT Long Term Goals - 09/26/21 1436       OT LONG TERM GOAL #1   Title Pt will be able to cut food w/ Mod I, using AE/compensatory strategies prn, 100% of the time    Baseline Frequently requires assist cutting food    Time 8    Period Weeks    Status Achieved   05/04/21     OT LONG TERM GOAL #2   Title Pt will be able to fasten/unfasten clothing manipulatives in 5/5 trials w/ Mod I, using AE prn    Baseline Requires assist w/ fasteners    Time 8    Period Weeks    Status Achieved   05/04/21     OT LONG TERM GOAL #3   Title Pt will demonstrate simulated tub/shower transfer safely w/ Mod I    Baseline Min A w/ tub/shower transfer    Time 8    Period Weeks    Status Achieved   06/13/21 - per pt report     OT LONG TERM GOAL #4   Title Pt will demonstrate independence w/ HEP designed for coordination, ROM, strength, and functional use of RUE by d/c    Baseline No HEP at this time    Time 8    Period Weeks    Status Achieved   07/18/21 - reports consistent carryover of exercises     OT LONG TERM GOAL #5   Title Pt will be able to use BUEs to safely place/retrieve medium-weight object (approx 5 lbs) above chest height    Baseline Decreased RUE ROM/strength impacting functional reach    Time 8    Period Weeks    Status On-going    Target Date 11/17/21      OT LONG TERM GOAL #6   Title Pt will increase RUE grip strength by at least 4 lbs to improve safety/prevent drops when carrying objects while ambulating w/ cane    Baseline 11 lbs (09/21/21)   08/23/21 - 15 lbs   Time 8    Period Weeks    Status On-going    Target Date 11/17/21  Plan - 09/26/21 1332     Clinical Impression Statement Pt continues to ve very motivated to improve and increase independence at home, and was in great spirits this session. OT discussed pt's progress and participation in functional activities, providing education, encouragement,  and answering pt questions as able. Then, to continue to improve functional use of RUE during bilateral tasks, OT completed gentle stretching and PROM to R shoulder true flexion for forward reach. Pt able to achieve good ROM w/out compensatory shoulder elevation, requiring tactile/verbal cues for alignment.    OT Occupational Profile and History Detailed Assessment- Review of Records and additional review of physical, cognitive, psychosocial history related to current functional performance    Occupational performance deficits (Please refer to evaluation for details): ADL's;IADL's;Work;Leisure;Social Participation    Body Structure / Function / Physical Skills ADL;ROM;UE functional use;Mobility;FMC;Decreased knowledge of use of DME;Balance;Body mechanics;Dexterity;Gait;Vision;GMC;Strength;Endurance;Coordination;IADL;Tone    Rehab Potential Good    Clinical Decision Making Several treatment options, min-mod task modification necessary    Comorbidities Affecting Occupational Performance: May have comorbidities impacting occupational performance    Modification or Assistance to Complete Evaluation  Min-Moderate modification of tasks or assist with assess necessary to complete eval    OT Frequency 1x / week    OT Duration 8 weeks    OT Treatment/Interventions Self-care/ADL training;Electrical Stimulation;Therapeutic exercise;Visual/perceptual remediation/compensation;Patient/family education;Splinting;Neuromuscular education;Moist Heat;Aquatic Therapy;Biofeedback;Energy conservation;Building services engineer;Therapeutic activities;Balance training;Passive range of motion;Manual Therapy;DME and/or AE instruction;Ultrasound;Cryotherapy    Plan Upper trap/levator scap stretch on R side; closed-chain shoulder AAROM; elbow extension activation; grip strengthening; overall activity tolerance and endurance    OT Home Exercise Plan MedBridge Code: [id number] - AROM of scap elevation/shoulder shrug; scapular retract  w/ elbows flex; upper trap stretch / levator scap stretch; chest press in supine; elbow ext w/ dumbbell or w/ ball in supine; gross grasp, lateral pinch, and pinch-and-pull w/ putty    Recommended Other Services Pt currently receiving PT exercises at this location    Consulted and Agree with Plan of Care Patient;Family member/caregiver    Family Member Consulted Kim (daughter)            Patient will benefit from skilled therapeutic intervention in order to improve the following deficits and impairments:   Body Structure / Function / Physical Skills: ADL, ROM, UE functional use, Mobility, FMC, Decreased knowledge of use of DME, Balance, Body mechanics, Dexterity, Gait, Vision, GMC, Strength, Endurance, Coordination, IADL, Tone   Visit Diagnosis: 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)  Other lack of coordination  Muscle weakness (generalized)  Unsteadiness on feet   Problem List Patient Active Problem List   Diagnosis Date Noted   COVID-19 09/06/2021   Nausea and vomiting 09/06/2021   Spastic hemiplegia of right dominant side as late effect of cerebral infarction (HCC) 07/11/2021   Other hyperlipidemia 07/06/2021   Brainstem infarct, acute (HCC) 03/01/2021   Acute cerebral infarction (HCC) 02/24/2021   Thyroid nodule 02/24/2021   Diabetes (HCC) 02/24/2021    Rosie Fate, MSOT, OTR/L 09/26/2021, 2:37 PM  [facility] [address] [place], [place], [address] Phone: [phone]   Fax:  [phone]  Name: Hortence Charter MRN: [id number] Date of Birth: 1946/07/24

## 2021-09-28 ENCOUNTER — Other Ambulatory Visit: Payer: Self-pay

## 2021-09-28 ENCOUNTER — Encounter: Payer: Self-pay | Admitting: Physical Medicine & Rehabilitation

## 2021-09-28 ENCOUNTER — Other Ambulatory Visit: Payer: Self-pay | Admitting: Nurse Practitioner

## 2021-09-28 ENCOUNTER — Encounter: Payer: Medicare Other | Attending: Registered Nurse | Admitting: Physical Medicine & Rehabilitation

## 2021-09-28 VITALS — BP 165/89 | HR 88 | Ht 60.0 in | Wt 187.0 lb

## 2021-09-28 DIAGNOSIS — I69351 Hemiplegia and hemiparesis following cerebral infarction affecting right dominant side: Secondary | ICD-10-CM | POA: Insufficient documentation

## 2021-09-28 NOTE — Patient Instructions (Signed)
Eating Plan After Stroke A stroke causes damage to brain cells, which can affect your ability to walk, talk, and eat. The impact of a stroke is different for everyone, and so is recovery. A good nutrition plan is important for your recovery. It can also lower your risk of another stroke. If you have difficulty chewing and swallowing your food, work with a diet and nutrition specialist (dietitian), or your stroke care team, to make sure that you are eating healthy foods and getting all the nutrients you need. What are tips for following this plan?  Reading food labels Choose foods that have less than 300 milligrams (mg) of salt (sodium) per serving. Limit your sodium intake to less than 1,500 mg per day. Avoid foods that have saturated fat and trans fat. Choose foods that are low in cholesterol. Limit the amount of cholesterol you eat each day to less than 200 mg. Choose foods that are high in fiber. Eat 20-30 grams (g) of fiber each day. Avoid foods with added sugar. Check the food label for ingredients such as sugar, corn syrup, honey, fructose, molasses, and cane juice. Shopping At the grocery store, buy most of your food from areas near the walls of the store. This includes: Fresh fruits and vegetables. Dry grains, beans, nuts, and seeds. Fresh seafood, poultry, lean meats, and eggs. Low-fat dairy products. Buy whole ingredients instead of prepackaged foods. Buy fresh, in-season fruits and vegetables from local farmers markets. Buy frozen fruits and vegetables in resealable bags. Cooking Prepare foods with very little salt. Use herbs or salt-free spices instead. Cook with heart-healthy oils, such as olive, avocado, canola, soybean, or sunflower oil. Avoid frying foods. Bake, grill, or broil foods instead. Remove visible fat and skin from meat and poultry before eating. Modify food textures as told by your health care provider. Meal planning Eat a variety of colorful fruits and  vegetables. Make sure one-half of your plate is filled with fruits and vegetables at each meal. Eat fruits and vegetables that are high in potassium, such as: Apples, bananas, oranges, and melon. Sweet potatoes, spinach, zucchini, and tomatoes. Eat fish that contain heart-healthy fats (omega-3 fats) at least twice a week. These include salmon, tuna, mackerel, and sardines. Eat plant foods that are high in omega-3 fats, such as flaxseeds and walnuts. Add these to cereals, yogurt, or pasta dishes. Eat several servings of high-fiber foods each day, such as fruits, vegetables, whole grains, and beans. Do not put salt on the table for meals. When eating out at restaurants: Ask the server about low-salt or salt-free food options. Avoid fried foods. Look for menu items that are grilled, steamed, broiled, or roasted. Ask if your food can be prepared without butter. Ask for condiments, such as salad dressings, gravy, or sauces to be served on the side. If you have difficulty swallowing: Choose foods that are softer and easier to chew and swallow. Cut foods into small pieces and chew well before swallowing. Thicken liquids as told by your health care provider or dietitian. Let your health care provider know if your condition does not improve over time. You may need to work with a speech therapist to retrain the muscles that are used for eating. General recommendations Involve your family and friends in your recovery, if possible. It may be helpful to have a slower meal time and to plan meals that include foods everyone in the family can eat. Brush your teeth with fluoride toothpaste twice a day, and floss once a day.   Keeping a clean mouth can help you swallow and can also help your appetite. Drink enough water to keep your urine pale yellow. If needed, set reminders or ask your family to help you remember to drink water. If you drink alcohol: Limit how much you use to: 0-1 drink a day for women who are  not pregnant. 0-2 drinks a day for men. Know how much alcohol is in your drink. In the [place] one drink equals one 12 oz bottle of beer (355 mL), one 5 oz glass of wine (148 mL), or one 1 oz glass of hard liquor (44 mL). Summary Following this eating plan can help your stroke recovery and can decrease your risk of another stroke. Limit your salt (sodium) and cholesterol intake. Choose foods that are high in fiber. Let your health care provider know if you have problems with swallowing. You may need to work with a speech therapist. This information is not intended to replace advice given to you by your health care provider. Make sure you discuss any questions you have with your health care provider. Document Revised: 07/09/2020 Document Reviewed: 07/09/2020 Elsevier Patient Education  2022 Elsevier Inc.  

## 2021-09-28 NOTE — Progress Notes (Signed)
Subjective:    Patient ID: Carrie Kline, female    DOB: [birth year], 76 y.o.   MRN: [id number]  76 y.o. right-handed female with history of gout as well as back surgery.  Per chart review lives with her children.  Independent prior to admission.  Presented 02/24/2021 with acute onset of right side weakness mild slurred speech.  CT/MRI showed acute infarct left pons.  Slight edema without mass-effect.  Patient did not receive tPA.  CT angiogram head and neck negative for large vessel occlusion.  Echocardiogram with ejection fraction of 55 to 60% no wall motion abnormalities.  Admission chemistries unremarkable except glucose 240 hemoglobin A1c 7.4.  Maintained on aspirin as well as Plavix for CVA prophylaxis x3 weeks total then aspirin alone.  Subcutaneous Lovenox for DVT prophylaxis.  Incidental findings thyroid nodule ultrasound of thyroid showing parenchymal echotexture recommend outpatient follow-up.  Patient tolerating a regular diet.  Glucophage initiated for new findings of diabetes mellitus.  Due to patient's right side weakness therapy evaluations completed was admitted for a comprehensive rehab program.  Admit date: 03/01/2021 Discharge date: 03/24/2021 HPI Had Covid and flu around the holidays, still feels a little weak Mod I dressing Supervision tub transfers Still getting OPPT and OPOT Was hospitalized with Dehydration and UTI 09/07/21 for 4 d Pain Inventory Average Pain 0 Pain Right Now 0 My pain is  stiffness  In the last 24 hours, has pain interfered with the following? General activity 0 Relation with others 0 Enjoyment of life 0 What TIME of day is your pain at its worst? evening Sleep (in general) Good  Pain is worse with:  not moving Pain improves with: therapy/exercise Relief from Meds:  na  Family History  Problem Relation Age of Onset   Heart attack Father    Diabetes Sister    Thyroid disease Daughter    Social History   Socioeconomic History   Marital status:  Widowed    Spouse name: Not on file   Number of children: Not on file   Years of education: Not on file   Highest education level: Not on file  Occupational History   Not on file  Tobacco Use   Smoking status: Never   Smokeless tobacco: Never  Vaping Use   Vaping Use: Never used  Substance and Sexual Activity   Alcohol use: No   Drug use: No   Sexual activity: Not on file  Other Topics Concern   Not on file  Social History Narrative   Not on file   Social Determinants of Health   Financial Resource Strain: Not on file  Food Insecurity: Not on file  Transportation Needs: Not on file  Physical Activity: Not on file  Stress: Not on file  Social Connections: Not on file   Past Surgical History:  Procedure Laterality Date   ABDOMINAL HYSTERECTOMY     BACK SURGERY     GANGLION CYST EXCISION     Past Surgical History:  Procedure Laterality Date   ABDOMINAL HYSTERECTOMY     BACK SURGERY     GANGLION CYST EXCISION     Past Medical History:  Diagnosis Date   Gout    Seasonal allergies    Stroke (HCC)    BP (!) 165/89    Pulse 88    Ht 5' (1.524 m)    Wt 187 lb (84.8 kg)    SpO2 97%    BMI 36.52 kg/m   Opioid Risk Score:   Fall Risk  Score:  `1  Depression screen PHQ 2/9  Depression screen [facility] 2/9 09/28/2021 07/11/2021 07/05/2021 04/03/2021  Decreased Interest 0 0 0 0  Down, Depressed, Hopeless 0 0 0 0  PHQ - 2 Score 0 0 0 0  Altered sleeping - - 0 0  Tired, decreased energy - - 0 3  Change in appetite - - 0 0  Feeling bad or failure about yourself  - - 0 0  Trouble concentrating - - 0 0  Moving slowly or fidgety/restless - - 0 0  Suicidal thoughts - - 0 0  PHQ-9 Score - - 0 3      Review of Systems  Musculoskeletal:        Right side stiffness      Objective:   Physical Exam Vitals and nursing note reviewed.  Constitutional:      Appearance: She is obese.  HENT:     Head: Normocephalic and atraumatic.  Eyes:     Extraocular Movements: Extraocular  movements intact.     Conjunctiva/sclera: Conjunctivae normal.     Pupils: Pupils are equal, round, and reactive to light.  Neurological:     Mental Status: She is alert.     Comments: Motor strength is 4/5 in the right deltoid, bicep, tricep, grip, hip flexor, knee extensor, ankle dorsiflexor plantar flexor 5/5 in the left deltoid, bicep, tricep, grip, hip flexor, knee extensor, ankle dorsiflexor and plantar flexor Sensation intact bilateral upper and lower limb Fine motor finger thumb opposition is slowed on the right side difficulty touching thumb to little finger Positive dysdiadochokinesis with rapid alternating supination pronation of the right upper extremity. Ambulates with a cane she has a right AFO which corrected her foot drop.  Psychiatric:        Mood and Affect: Mood normal.        Behavior: Behavior normal.          Assessment & Plan:   1.  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  She must ambulate with a cane and she states when she goes into more crowded places she prefers the Rollator to maintain her balance.  We will write for a permanent handicap parking placard.  Continue outpatient PT and OT Physical medicine rehab follow-up in 3 months Printed instructions for DASH diet

## 2021-09-29 ENCOUNTER — Other Ambulatory Visit: Payer: Self-pay | Admitting: Nurse Practitioner

## 2021-10-03 ENCOUNTER — Ambulatory Visit: Payer: Medicare Other | Admitting: Occupational Therapy

## 2021-10-03 ENCOUNTER — Other Ambulatory Visit: Payer: Self-pay | Admitting: Nurse Practitioner

## 2021-10-03 ENCOUNTER — Other Ambulatory Visit: Payer: Self-pay | Admitting: *Deleted

## 2021-10-03 ENCOUNTER — Other Ambulatory Visit: Payer: Self-pay | Admitting: Family Medicine

## 2021-10-03 ENCOUNTER — Ambulatory Visit: Payer: Medicare Other | Admitting: Physical Therapy

## 2021-10-03 MED ORDER — ALBUTEROL SULFATE HFA 108 (90 BASE) MCG/ACT IN AERS: 2.0000 | INHALATION_SPRAY | Freq: Four times a day (QID) | RESPIRATORY_TRACT | 0 refills | Status: DC | PRN

## 2021-10-04 ENCOUNTER — Other Ambulatory Visit: Payer: Self-pay | Admitting: Nurse Practitioner

## 2021-10-05 ENCOUNTER — Other Ambulatory Visit: Payer: Self-pay

## 2021-10-05 ENCOUNTER — Encounter: Payer: Self-pay | Admitting: Physical Therapy

## 2021-10-05 ENCOUNTER — Ambulatory Visit: Payer: Medicare Other | Attending: Physician Assistant | Admitting: Occupational Therapy

## 2021-10-05 ENCOUNTER — Encounter: Payer: Self-pay | Admitting: Occupational Therapy

## 2021-10-05 ENCOUNTER — Ambulatory Visit: Payer: Medicare Other | Admitting: Physical Therapy

## 2021-10-05 DIAGNOSIS — M6281 Muscle weakness (generalized): Secondary | ICD-10-CM

## 2021-10-05 DIAGNOSIS — I69353 Hemiplegia and hemiparesis 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: Secondary | ICD-10-CM | POA: Insufficient documentation

## 2021-10-05 DIAGNOSIS — R2681 Unsteadiness on feet: Secondary | ICD-10-CM

## 2021-10-05 DIAGNOSIS — R261 Paralytic gait: Secondary | ICD-10-CM

## 2021-10-05 DIAGNOSIS — I639 Cerebral infarction, unspecified: Secondary | ICD-10-CM | POA: Insufficient documentation

## 2021-10-05 DIAGNOSIS — R2689 Other abnormalities of gait and mobility: Secondary | ICD-10-CM | POA: Diagnosis present

## 2021-10-05 DIAGNOSIS — R278 Other lack of coordination: Secondary | ICD-10-CM | POA: Diagnosis present

## 2021-10-05 NOTE — Therapy (Signed)
[facility] [address] [place], [place], [address] Phone: [phone]   Fax:  [phone]  Physical Therapy Treatment Progress Note Reporting Period 07/05/21 to 10/05/21  See note below for Objective Data and Assessment of Progress/Goals.     Patient Details  Name: Carrie Kline MRN: [id number] Date of Birth: 12-17-1945 Referring Provider (PT): Charlton Amor, PA-C   Encounter Date: 10/05/2021   PT End of Session - 10/05/21 1519     Visit Number 30    Number of Visits 38    Date for PT Re-Evaluation 10/26/21    PT Start Time 1445    PT Stop Time 1524    PT Time Calculation (min) 39 min    Equipment Utilized During Treatment Gait belt    Activity Tolerance Patient tolerated treatment well;Patient limited by fatigue    Behavior During Therapy WFL for tasks assessed/performed             Past Medical History:  Diagnosis Date   Gout    Seasonal allergies    Stroke [facility])     Past Surgical History:  Procedure Laterality Date   ABDOMINAL HYSTERECTOMY     BACK SURGERY     GANGLION CYST EXCISION      There were no vitals filed for this visit.   Subjective Assessment - 10/05/21 1450     Subjective Patient reports stiffness. B shoulders and L knee are painful. She does not feel like it needs attention from a Dr, just annoying.    Pertinent History gout, back surgery    Currently in Pain? No/denies                               [facility] Adult PT Treatment/Exercise - 10/05/21 0001       Lumbar Exercises: Aerobic   Nustep L3 x 6 minutes.      Lumbar Exercises: Standing   Other Standing Lumbar Exercises Paloff Press standing on Air Ex pad, 10 reps L side to anchor. side.      Knee/Hip Exercises: Seated   Clamshell with TheraBand Red   2 x 10 reps   Sit to Sand 2 sets;5 reps;without UE support                       PT Short Term Goals - 10/05/21 1456       PT SHORT TERM  GOAL #2   Title Patient will be I with updated HEP    Period Weeks    Status Achieved               PT Long Term Goals - 10/05/21 1458       PT LONG TERM GOAL #1   Title Patient to be independent with advanced HEP.    Time 3    Period Weeks    Status On-going    Target Date 10/26/21      PT LONG TERM GOAL #2   Title Patient to demonstrate R LE strength >/=4/5.    Baseline 3+/5    Time 3    Period Weeks    Status On-going    Target Date 10/26/21      PT LONG TERM GOAL #3   Title Patient to complete TUG in <14 sec with LRAD in order to decrease risk of falls.    Baseline 22.16 sec    Time 5  Period Weeks    Status On-going    Target Date 10/26/21      PT LONG TERM GOAL #4   Title Patient to complete 5xSTS in <15 sec in order to decrease risk of falls.    Baseline Re-assessed after hospitalization 14.38    Time 5    Period Weeks    Status Achieved                   Plan - 10/05/21 1506     Clinical Impression Statement Patient reports stiffness in multiple places, but not really painful. She participated well, focusing on RLE WBing and strength as well as trunk stability and balance. she participated well, fatigued at the end of treatment.    Personal Factors and Comorbidities Comorbidity 2;Age;Time since onset of injury/illness/exacerbation;Past/Current Experience    Examination-Activity Limitations Bathing;Transfers;Locomotion Level;Bed Mobility;Bend;Sit;Caring for Others;Carry;Squat;Stairs;Stand;Toileting;Lift;Hygiene/Grooming;Dressing    Stability/Clinical Decision Making Stable/Uncomplicated    Clinical Decision Making Moderate    Rehab Potential Good    PT Frequency 2x / week    PT Duration 3 weeks    PT Treatment/Interventions ADLs/Self Care Home Management;Cryotherapy;DME Instruction;Electrical Stimulation;Ultrasound;Moist Heat;Gait training;Stair training;Functional mobility training;Therapeutic activities;Therapeutic exercise;Balance  training;Neuromuscular re-education;Patient/family education;Orthotic Fit/Training;Manual techniques;Splinting;Energy conservation;Dry needling;Passive range of motion;Taping;Vasopneumatic Device;Visual/perceptual remediation/compensation    PT Next Visit Plan Continue to progress activities for strength, balance, activity tolerance.    PT Home Exercise Plan [facility]    Consulted and Agree with Plan of Care Patient             Patient will benefit from skilled therapeutic intervention in order to improve the following deficits and impairments:  Abnormal gait, Decreased coordination, Decreased range of motion, Difficulty walking, Increased fascial restricitons, Increased muscle spasms, Decreased safety awareness, Decreased activity tolerance, Improper body mechanics, Impaired flexibility, Decreased balance, Postural dysfunction, Decreased strength, Decreased mobility  Visit Diagnosis: 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)  Other lack of coordination  Muscle weakness (generalized)  Other abnormalities of gait and mobility  Unsteadiness on feet  Paralytic gait  Acute cerebral infarction [facility])     Problem List Patient Active Problem List   Diagnosis Date Noted   COVID-19 09/06/2021   Nausea and vomiting 09/06/2021   Spastic hemiplegia of right dominant side as late effect of cerebral infarction (HCC) 07/11/2021   Other hyperlipidemia 07/06/2021   Brainstem infarct, acute (HCC) 03/01/2021   Acute cerebral infarction (HCC) 02/24/2021   Thyroid nodule 02/24/2021   Diabetes (HCC) 02/24/2021    Iona Beard, DPT 10/05/2021, 3:21 PM  [facility] [address] [place], [place], [address] Phone: [phone]   Fax:  [phone]  Name: Carrie Kline MRN: [id number] Date of Birth: March 20, 1946

## 2021-10-06 ENCOUNTER — Other Ambulatory Visit: Payer: Self-pay | Admitting: *Deleted

## 2021-10-06 ENCOUNTER — Other Ambulatory Visit: Payer: Self-pay | Admitting: Family Medicine

## 2021-10-06 MED ORDER — PANTOPRAZOLE SODIUM 40 MG PO TBEC: 40.0000 mg | DELAYED_RELEASE_TABLET | Freq: Every day | ORAL | 0 refills | Status: DC

## 2021-10-06 NOTE — Therapy (Signed)
[place]. [place], [place], [address] Phone: [phone]   Fax:  [phone]  Occupational Therapy Treatment & Progress Note  Patient Details  Name: Carrie Kline MRN: [id number] Date of Birth: [birth year] Referring Provider (OT): Lauraine Rinne, PA-C   Encounter Date: 10/05/2021   OT End of Session - 10/05/21 1535     Visit Number 30    Number of Visits 32   Initial 28 + 4 additional visits (1x/week)   Date for OT Re-Evaluation 10/26/21    Authorization Type [employer]BS Medicare (primary); Medicaid [place] (secondary)    Authorization Time Period VL: [place]    Progress Note Due on Visit 30    OT Start Time 1528    OT Stop Time 1608    OT Time Calculation (min) 40 min    Activity Tolerance Patient tolerated treatment well    Behavior During Therapy WFL for tasks assessed/performed            Occupational Therapy Progress Note  Dates of Reporting Period: 07/11/21 to 10/09/21  Objective Reports of Subjective Statement: Pt currently Mod I-SPV w/ all ADLs  Objective Measurements: 9-HPT, AROM w/ goniometer, grip strength w/ dynamometer,   Goal Update: Pt has met 4/5 STGs and 4/6 LTGs at this time  Plan: Continue to address increased arc of motion w/ R shoulder flexion for functional reach w/ BUEs, and RUE grip strength  Reason Skilled Services are Required: Pt recently spent an extended time away from therapy, but has returns and now continues to progress slowly, but steadily toward functional goals and improved participation and independence w/ functional tasks. She continues to experience decreased AROM of R shoulder; mild deficits w/ R hand FMC (non-dominant side) and grip strength; and decreased balance and steadiness of gait   Past Medical History:  Diagnosis Date   Gout    Seasonal allergies    Stroke [facility])     Past Surgical History:  Procedure Laterality Date   ABDOMINAL HYSTERECTOMY     BACK SURGERY      GANGLION CYST EXCISION      There were no vitals filed for this visit.   Subjective Assessment - 10/05/21 1529     Subjective  Pt reports she went to a follow-up w/ her physiatrist on Thursday and that it went well    Pertinent History CVA w/ R-sided weakness on 02/24/21; Hx of gout and back surgery    Patient Stated Goals "Moving my R arm"    Currently in Pain? No/denies             Treatment/Exercises - 10/05/21    RUE ROM  OT performed gentle passive stretching and PROM of R shoulder flexion and abduction to increase ROM w/ additional benefit of decreased joint stiffness and potential increase in motor function. Pt able to achieve approx 100 degrees of true flexion w/out compensatory scapular elevation during PROM. Tolerated exercise w/out discomfort or pain. PROM limited by stiffness.    Pegboard Activity Completed pegboard activity w/ easy-grip pegs involving pt picking up pegs, rotating in-hand, and then placing into a vertical stack for RUE AROM and GMC to improve functional reach; OT provided mod verbal and tactile cues to decrease compensatory R shoulder elevation. Able to complete 15 pegs in a stack.            OT Short Term Goals - 09/26/21 1435       OT SHORT TERM GOAL #1   Title  Pt will improve independence w/ functional FM tasks (e.g., clothing manipulatives) as evidenced by being able to complete 9HPT w/ R hand    Baseline Completed 4 pegs in >1 min w R hand    Time 4    Period Weeks    Status Achieved   04/20/21 - 1 min, 23 sec   Target Date 04/28/21      OT SHORT TERM GOAL #2   Title Pt will improve AROM of R shoulder scaption by at least 20 degrees to facilitate improved functional reach   Revised goal from achieving shoulder flexion to scaption due to focus on function   Baseline R shoulder scaption 28 degrees    Time 4    Period Weeks    Status Achieved   06/06/21 - 96* of scaption w/ difficulty attaining true flexion beyond chest height; 05/02/21 - 52* of  scaption     OT SHORT TERM GOAL #3   Title Pt will improve AROM of R elbow extension by at least 15 degrees to facilitate improved functional reach    Baseline Achieves 52 degrees of flexion when attempting elbow extension    Time 4    Period Weeks    Status Achieved   04/18/21 - 21* of flex w/ elbow ext     OT SHORT TERM GOAL #4   Title Pt will increase R hand grip strength by at least 4 lbs to improve independence w/ IADLs    Baseline 04/25/21 - RUE 4# (LUE 39#)    Time 4    Period Weeks    Status Achieved   08/23/21 - 15 lbs; 06/06/21 - 6 lbs w/ RUE     OT SHORT TERM GOAL #5   Title Pt will be able to demonstrate R shoulder scaption w/out compensatory scapular elevation to improve posture/alignment during functional reach    Baseline 72 degrees of scaption w/ compensatory elevation (09/21/21)    Time 4    Period Weeks    Status On-going    Target Date 10/26/21             OT Long Term Goals - 10/05/21 1552       OT LONG TERM GOAL #1   Title Pt will be able to cut food w/ Mod I, using AE/compensatory strategies prn, 100% of the time    Baseline Frequently requires assist cutting food    Time 8    Period Weeks    Status Achieved   05/04/21     OT LONG TERM GOAL #2   Title Pt will be able to fasten/unfasten clothing manipulatives in 5/5 trials w/ Mod I, using AE prn    Baseline Requires assist w/ fasteners    Time 8    Period Weeks    Status Achieved   05/04/21     OT LONG TERM GOAL #3   Title Pt will demonstrate simulated tub/shower transfer safely w/ Mod I    Baseline Min A w/ tub/shower transfer    Time 8    Period Weeks    Status Achieved   06/13/21 - per pt report     OT LONG TERM GOAL #4   Title Pt will demonstrate independence w/ HEP designed for coordination, ROM, strength, and functional use of RUE by d/c    Baseline No HEP at this time    Time 8    Period Weeks    Status Achieved   07/18/21 - reports consistent carryover of exercises  OT LONG TERM  GOAL #5   Title Pt will be able to use BUEs to safely place/retrieve medium-weight object (approx 5 lbs) above chest height    Baseline Decreased RUE ROM/strength impacting functional reach    Time 8    Period Weeks    Status On-going    Target Date 11/17/21      OT LONG TERM GOAL #6   Title Pt will increase RUE grip strength by at least 4 lbs to improve safety/prevent drops when carrying objects while ambulating w/ cane    Baseline 11 lbs (09/21/21)   08/23/21 - 15 lbs   Time 8    Period Weeks    Status On-going    Target Date 11/17/21             Plan - 10/05/21 1549     Clinical Impression Statement OT discussed pt's progress and participation in functional activities, providing education, encouragement, and answering pt questions as able. OT also completed informal reassessment toward goals. Then, to continue to improve functional use of RUE during bilateral tasks, OT completed gentle stretching and PROM to R shoulder true flexion followed by functional reach activity to stack pegs vertically. OT emphasized correcting/preventing compensatory movement patterns, particularly R shoulder elevation w/ pt demonstrating good carryover within activity.    OT Occupational Profile and History Detailed Assessment- Review of Records and additional review of physical, cognitive, psychosocial history related to current functional performance    Occupational performance deficits (Please refer to evaluation for details): ADL's;IADL's;Work;Leisure;Social Participation    Body Structure / Function / Physical Skills ADL;ROM;UE functional use;Mobility;FMC;Decreased knowledge of use of DME;Balance;Body mechanics;Dexterity;Gait;Vision;GMC;Strength;Endurance;Coordination;IADL;Tone    Rehab Potential Good    Clinical Decision Making Several treatment options, min-mod task modification necessary    Comorbidities Affecting Occupational Performance: May have comorbidities impacting occupational performance     Modification or Assistance to Complete Evaluation  Min-Moderate modification of tasks or assist with assess necessary to complete eval    OT Frequency 1x / week    OT Duration 8 weeks    OT Treatment/Interventions Self-care/ADL training;Electrical Stimulation;Therapeutic exercise;Visual/perceptual remediation/compensation;Patient/family education;Splinting;Neuromuscular education;Moist Heat;Aquatic Therapy;Biofeedback;Energy conservation;Therapist, nutritional;Therapeutic activities;Balance training;Passive range of motion;Manual Therapy;DME and/or AE instruction;Ultrasound;Cryotherapy    Plan Upper trap/levator scap stretch on R side; closed-chain shoulder AAROM; elbow extension activation; grip strengthening; overall activity tolerance and endurance    OT Home Exercise Plan MedBridge Code: [email or web address] - AROM of scap elevation/shoulder shrug; scapular retract w/ elbows flex; upper trap stretch / levator scap stretch; chest press in supine; elbow ext w/ dumbbell or w/ ball in supine; gross grasp, lateral pinch, and pinch-and-pull w/ putty    Recommended Other Services Pt currently receiving PT exercises at this location    Consulted and Agree with Plan of Care Patient;Family member/caregiver    Family Member Consulted Kim (daughter)            Patient will benefit from skilled therapeutic intervention in order to improve the following deficits and impairments:   Body Structure / Function / Physical Skills: ADL, ROM, UE functional use, Mobility, FMC, Decreased knowledge of use of DME, Balance, Body mechanics, Dexterity, Gait, Vision, GMC, Strength, Endurance, Coordination, IADL, Tone   Visit Diagnosis: 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)  Other lack of coordination  Muscle weakness (generalized)   Problem List Patient Active Problem List   Diagnosis Date Noted   COVID-19 09/06/2021   Nausea and vomiting 09/06/2021    Spastic hemiplegia of right  dominant side as late effect of cerebral infarction ([place]) 07/11/2021   Other hyperlipidemia 07/06/2021   Brainstem infarct, acute ([place]) 03/01/2021   Acute cerebral infarction [facility]) 02/24/2021   Thyroid nodule 02/24/2021   Diabetes ([employer]) 02/24/2021    Kathrine Cords, MSOT, OTR/L 10/06/2021, 4:48 PM  [place]. [place], [place], [address] Phone: [phone]   Fax:  [phone]  Name: Carrie Kline MRN: [id number] Date of Birth: Feb 02, 1946

## 2021-10-10 ENCOUNTER — Ambulatory Visit: Payer: Medicare Other | Admitting: Occupational Therapy

## 2021-10-10 ENCOUNTER — Ambulatory Visit: Payer: Medicare Other | Admitting: Physical Therapy

## 2021-10-12 ENCOUNTER — Ambulatory Visit: Payer: Medicare Other | Admitting: Occupational Therapy

## 2021-10-12 ENCOUNTER — Ambulatory Visit: Payer: Medicare Other | Admitting: Physical Therapy

## 2021-10-16 ENCOUNTER — Telehealth: Payer: Self-pay | Admitting: Family Medicine

## 2021-10-16 ENCOUNTER — Other Ambulatory Visit: Payer: Self-pay | Admitting: *Deleted

## 2021-10-16 DIAGNOSIS — E7849 Other hyperlipidemia: Secondary | ICD-10-CM

## 2021-10-16 MED ORDER — ATORVASTATIN CALCIUM 40 MG PO TABS: 40.0000 mg | ORAL_TABLET | Freq: Every day | ORAL | 1 refills | Status: DC

## 2021-10-16 NOTE — Telephone Encounter (Signed)
Medication has been ordered for patient

## 2021-10-16 NOTE — Telephone Encounter (Signed)
Is an appt needed at this time to get med refill for this medication?   atorvastatin (LIPITOR) 40 MG tablet [id number]  ENDED  [address]  [place], [place] [address]  Phone:  [phone]  Fax:  [phone]

## 2021-10-17 ENCOUNTER — Ambulatory Visit: Payer: Medicare Other | Admitting: Occupational Therapy

## 2021-10-17 ENCOUNTER — Ambulatory Visit: Payer: Medicare Other | Admitting: Physical Therapy

## 2021-10-19 ENCOUNTER — Ambulatory Visit: Payer: Medicare Other | Admitting: Physical Therapy

## 2021-10-19 ENCOUNTER — Encounter: Payer: Self-pay | Admitting: Physical Therapy

## 2021-10-19 ENCOUNTER — Ambulatory Visit: Payer: Medicare Other | Admitting: Occupational Therapy

## 2021-10-19 ENCOUNTER — Encounter: Payer: Self-pay | Admitting: Occupational Therapy

## 2021-10-19 ENCOUNTER — Other Ambulatory Visit: Payer: Self-pay

## 2021-10-19 DIAGNOSIS — I69353 Hemiplegia and hemiparesis 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: Secondary | ICD-10-CM

## 2021-10-19 DIAGNOSIS — R2681 Unsteadiness on feet: Secondary | ICD-10-CM

## 2021-10-19 DIAGNOSIS — R2689 Other abnormalities of gait and mobility: Secondary | ICD-10-CM

## 2021-10-19 DIAGNOSIS — I639 Cerebral infarction, unspecified: Secondary | ICD-10-CM

## 2021-10-19 DIAGNOSIS — R278 Other lack of coordination: Secondary | ICD-10-CM

## 2021-10-19 DIAGNOSIS — M6281 Muscle weakness (generalized): Secondary | ICD-10-CM

## 2021-10-19 DIAGNOSIS — R261 Paralytic gait: Secondary | ICD-10-CM

## 2021-10-19 NOTE — Therapy (Signed)
[facility] [address] [place], [place], [address] Phone: [phone]   Fax:  [phone]  Occupational Therapy Treatment  Patient Details  Name: Carrie Kline MRN: [id number] Date of Birth: 1946-05-30 Referring Provider (OT): Mariam Dollar, PA-C   Encounter Date: 10/19/2021   OT End of Session - 10/19/21 1455     Visit Number 31    Number of Visits 32   Initial 28 + 4 additional visits (1x/week)   Date for OT Re-Evaluation 10/26/21    Authorization Type [employer] Medicare (primary); Medicaid [place] (secondary)    Authorization Time Period VL: [place]    Progress Note Due on Visit 30    OT Start Time 1446    OT Stop Time 1530    OT Time Calculation (min) 44 min    Activity Tolerance Patient tolerated treatment well    Behavior During Therapy WFL for tasks assessed/performed            Past Medical History:  Diagnosis Date   Gout    Seasonal allergies    Stroke [facility])     Past Surgical History:  Procedure Laterality Date   ABDOMINAL HYSTERECTOMY     BACK SURGERY     GANGLION CYST EXCISION      There were no vitals filed for this visit.   Subjective Assessment - 10/19/21 1449     Subjective  Pt reports continuing to experience stiffness that she believes is related to arthritis   Pertinent History CVA w/ R-sided weakness on 02/24/21; Hx of gout and back surgery    Patient Stated Goals "Moving my R arm"    Currently in Pain? No/denies             OT Treatment - 10/19/21    Passive Stretching OT performed gentle RUE PROM and passive stretching, holding stretch at end-range, of composite wrist/finger extension, elbow extension, and shoulder flexion to increase ROM and decrease joint stiffness. Pt able to achieve wrist and elbow ROM WFL, and approx 100 degrees of true flexion w/out compensatory scapular elevation during passive stretching. Tolerated exercise w/out pain. Shoulder flexion PROM limited by stiffness.     Self-Stretching Reviewed self-stretching exercises for shoulder flexion, including towel slides, stretching overhead w/ hands clasped, and variations of closed-chain shoulder flexion w/ cane; pt verbalized/demonstrated understanding            OT Short Term Goals - 10/19/21 1530       OT SHORT TERM GOAL #1   Title Pt will improve independence w/ functional FM tasks (e.g., clothing manipulatives) as evidenced by being able to complete 9HPT w/ R hand    Baseline Completed 4 pegs in >1 min w R hand    Time 4    Period Weeks    Status Achieved   04/20/21 - 1 min, 23 sec   Target Date 04/28/21      OT SHORT TERM GOAL #2   Title Pt will improve AROM of R shoulder scaption by at least 20 degrees to facilitate improved functional reach   Revised goal from achieving shoulder flexion to scaption due to focus on function   Baseline R shoulder scaption 28 degrees    Time 4    Period Weeks    Status Achieved   06/06/21 - 96* of scaption w/ difficulty attaining true flexion beyond chest height; 05/02/21 - 52* of scaption     OT SHORT TERM GOAL #3   Title Pt will improve  AROM of R elbow extension by at least 15 degrees to facilitate improved functional reach    Baseline Achieves 52 degrees of flexion when attempting elbow extension    Time 4    Period Weeks    Status Achieved   04/18/21 - 21* of flex w/ elbow ext     OT SHORT TERM GOAL #4   Title Pt will increase R hand grip strength by at least 4 lbs to improve independence w/ IADLs    Baseline 04/25/21 - RUE 4# (LUE 39#)    Time 4    Period Weeks    Status Achieved   08/23/21 - 15 lbs; 06/06/21 - 6 lbs w/ RUE     OT SHORT TERM GOAL #5   Title Pt will be able to demonstrate R shoulder scaption w/out compensatory scapular elevation to improve posture/alignment during functional reach    Baseline 72 degrees of scaption w/ compensatory elevation (09/21/21)    Time 4    Period Weeks    Status Achieved   10/19/20   Target Date 10/26/21              OT Long Term Goals - 10/05/21 1552       OT LONG TERM GOAL #1   Title Pt will be able to cut food w/ Mod I, using AE/compensatory strategies prn, 100% of the time    Baseline Frequently requires assist cutting food    Time 8    Period Weeks    Status Achieved   05/04/21     OT LONG TERM GOAL #2   Title Pt will be able to fasten/unfasten clothing manipulatives in 5/5 trials w/ Mod I, using AE prn    Baseline Requires assist w/ fasteners    Time 8    Period Weeks    Status Achieved   05/04/21     OT LONG TERM GOAL #3   Title Pt will demonstrate simulated tub/shower transfer safely w/ Mod I    Baseline Min A w/ tub/shower transfer    Time 8    Period Weeks    Status Achieved   06/13/21 - per pt report     OT LONG TERM GOAL #4   Title Pt will demonstrate independence w/ HEP designed for coordination, ROM, strength, and functional use of RUE by d/c    Baseline No HEP at this time    Time 8    Period Weeks    Status Achieved   07/18/21 - reports consistent carryover of exercises     OT LONG TERM GOAL #5   Title Pt will be able to use BUEs to safely place/retrieve medium-weight object (approx 5 lbs) above chest height    Baseline Decreased RUE ROM/strength impacting functional reach    Time 8    Period Weeks    Status On-going    Target Date 11/17/21      OT LONG TERM GOAL #6   Title Pt will increase RUE grip strength by at least 4 lbs to improve safety/prevent drops when carrying objects while ambulating w/ cane    Baseline 11 lbs (09/21/21)   08/23/21 - 15 lbs   Time 8    Period Weeks    Status On-going    Target Date 11/17/21             Plan - 10/19/21 1502     Clinical Impression Statement OT facilitated thorough discussion regarding POC, pt's current functional status/level of participation in  daily activities, and likely d/c planning. Pt does seem hesitant to transition from skilled OT to HEP at this time w/ OT providing continued relevant condition-specific  education and encouragement that pt has progressed very well in therapy and that OT feels confident in pt's continued success and independence after d/c from OP therapy. Pt verbalized understanding; will circle back to potential d/c next session.   OT Occupational Profile and History Detailed Assessment- Review of Records and additional review of physical, cognitive, psychosocial history related to current functional performance    Occupational performance deficits (Please refer to evaluation for details): ADL's;IADL's;Work;Leisure;Social Participation    Body Structure / Function / Physical Skills ADL;ROM;UE functional use;Mobility;FMC;Decreased knowledge of use of DME;Balance;Body mechanics;Dexterity;Gait;Vision;GMC;Strength;Endurance;Coordination;IADL;Tone    Rehab Potential Good    Clinical Decision Making Several treatment options, min-mod task modification necessary    Comorbidities Affecting Occupational Performance: May have comorbidities impacting occupational performance    Modification or Assistance to Complete Evaluation  Min-Moderate modification of tasks or assist with assess necessary to complete eval    OT Frequency 1x / week    OT Duration 8 weeks    OT Treatment/Interventions Self-care/ADL training;Electrical Stimulation;Therapeutic exercise;Visual/perceptual remediation/compensation;Patient/family education;Splinting;Neuromuscular education;Moist Heat;Aquatic Therapy;Biofeedback;Energy conservation;Building services engineer;Therapeutic activities;Balance training;Passive range of motion;Manual Therapy;DME and/or AE instruction;Ultrasound;Cryotherapy    Plan Potential d/c; reassess any remaining LTGs   OT Home Exercise Plan MedBridge Code: [email or web address]    Recommended Other Services Pt currently receiving PT exercises at this location    Consulted and Agree with Plan of Care Patient;Family member/caregiver    Family Member Consulted Kim (daughter)            Patient will benefit  from skilled therapeutic intervention in order to improve the following deficits and impairments:   Body Structure / Function / Physical Skills: ADL, ROM, UE functional use, Mobility, FMC, Decreased knowledge of use of DME, Balance, Body mechanics, Dexterity, Gait, Vision, GMC, Strength, Endurance, Coordination, IADL, Tone   Visit Diagnosis: 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)  Other lack of coordination  Muscle weakness (generalized)  Unsteadiness on feet   Problem List Patient Active Problem List   Diagnosis Date Noted   COVID-19 09/06/2021   Nausea and vomiting 09/06/2021   Spastic hemiplegia of right dominant side as late effect of cerebral infarction (HCC) 07/11/2021   Other hyperlipidemia 07/06/2021   Brainstem infarct, acute (HCC) 03/01/2021   Acute cerebral infarction (HCC) 02/24/2021   Thyroid nodule 02/24/2021   Diabetes (HCC) 02/24/2021    Rosie Fate, MSOT, OTR/L 10/19/2021, 3:35 PM  [facility] [address] [place], [place], [address] Phone: [phone]   Fax:  [phone]  Name: Carrie Kline MRN: [id number] Date of Birth: 05-06-1946

## 2021-10-19 NOTE — Therapy (Signed)
[facility] [address] [place], [place], [address] Phone: [phone]   Fax:  [phone]  Physical Therapy Treatment  Patient Details  Name: Carrie Kline MRN: [id number] Date of Birth: May 13, 1946 Referring Provider (PT): Charlton Amor, PA-C   Encounter Date: 10/19/2021   PT End of Session - 10/19/21 1554     Visit Number 31    Number of Visits 38    Date for PT Re-Evaluation 10/26/21    PT Start Time 1529    PT Stop Time 1609    PT Time Calculation (min) 40 min    Equipment Utilized During Treatment Gait belt    Activity Tolerance Patient tolerated treatment well;Patient limited by fatigue    Behavior During Therapy WFL for tasks assessed/performed             Past Medical History:  Diagnosis Date   Gout    Seasonal allergies    Stroke [facility])     Past Surgical History:  Procedure Laterality Date   ABDOMINAL HYSTERECTOMY     BACK SURGERY     GANGLION CYST EXCISION      There were no vitals filed for this visit.   Subjective Assessment - 10/19/21 1530     Subjective Patient reports continued stiffness, probably from arthritis, but overall feels ok.    Currently in Pain? No/denies                               [facility] Adult PT Treatment/Exercise - 10/19/21 0001       Ambulation/Gait   Gait Comments Ambulated 1 x 100' without AD, CGA, TC to R gluts for activation in stance.      Knee/Hip Exercises: Standing   Walking with Sports Cord 20 #, 3 x in each direction                     PT Education - 10/19/21 1553     Education Details Patient reports she is confident in her HEP.    Person(s) Educated Patient    Methods Explanation    Comprehension Verbalized understanding              PT Short Term Goals - 10/05/21 1456       PT SHORT TERM GOAL #2   Title Patient will be I with updated HEP    Period Weeks    Status Achieved               PT  Long Term Goals - 10/19/21 1552       PT LONG TERM GOAL #1   Title Patient to be independent with advanced HEP.    Baseline Patient reports that she is confident with her HEP.    Time 3    Period Weeks    Status Achieved    Target Date 10/26/21      PT LONG TERM GOAL #2   Title Patient to demonstrate R LE strength >/=4/5.    Baseline 3+/5    Time 3    Period Weeks    Status On-going    Target Date 10/26/21      PT LONG TERM GOAL #3   Title Patient to complete TUG in <14 sec with LRAD in order to decrease risk of falls.    Baseline 22.16 sec    Time 5    Period Weeks  Status On-going    Target Date 10/26/21      PT LONG TERM GOAL #4   Title Patient to complete 5xSTS in <15 sec in order to decrease risk of falls.    Baseline Re-assessed after hospitalization 14.38    Time 5    Period Weeks    Status Achieved                   Plan - 10/19/21 1532     Clinical Impression Statement Discussed D/C planning. Patient reluctant, but also recognizes she is ready. Reviewed HEP, which is quite extensive, giving patient multiple options after D/C. She reports that her family ensures she remains active daily.    Personal Factors and Comorbidities Comorbidity 2;Age;Time since onset of injury/illness/exacerbation;Past/Current Experience    Examination-Activity Limitations Bathing;Transfers;Locomotion Level;Bed Mobility;Bend;Sit;Caring for Others;Carry;Squat;Stairs;Stand;Toileting;Lift;Hygiene/Grooming;Dressing    Stability/Clinical Decision Making Stable/Uncomplicated    Clinical Decision Making Moderate    Rehab Potential Good    PT Frequency 2x / week    PT Duration 2 weeks    PT Treatment/Interventions ADLs/Self Care Home Management;Cryotherapy;DME Instruction;Electrical Stimulation;Ultrasound;Moist Heat;Gait training;Stair training;Functional mobility training;Therapeutic activities;Therapeutic exercise;Balance training;Neuromuscular re-education;Patient/family  education;Orthotic Fit/Training;Manual techniques;Splinting;Energy conservation;Dry needling;Passive range of motion;Taping;Vasopneumatic Device;Visual/perceptual remediation/compensation    PT Next Visit Plan Continue to progress activities for strength, balance, activity tolerance.    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)  Other lack of coordination  Muscle weakness (generalized)  Unsteadiness on feet  Other abnormalities of gait and mobility  Paralytic gait  Acute cerebral infarction [facility])     Problem List Patient Active Problem List   Diagnosis Date Noted   COVID-19 09/06/2021   Nausea and vomiting 09/06/2021   Spastic hemiplegia of right dominant side as late effect of cerebral infarction (HCC) 07/11/2021   Other hyperlipidemia 07/06/2021   Brainstem infarct, acute (HCC) 03/01/2021   Acute cerebral infarction (HCC) 02/24/2021   Thyroid nodule 02/24/2021   Diabetes (HCC) 02/24/2021    Iona Beard, DPT 10/19/2021, 4:14 PM  [facility] [address] [place], [place], [address] Phone: [phone]   Fax:  [phone]  Name: Carrie Kline MRN: [id number] Date of Birth: 30-Nov-1945

## 2021-10-24 ENCOUNTER — Ambulatory Visit: Payer: Medicare Other | Admitting: Physical Therapy

## 2021-10-24 ENCOUNTER — Ambulatory Visit: Payer: Medicare Other | Admitting: Occupational Therapy

## 2021-10-26 ENCOUNTER — Ambulatory Visit: Payer: Medicare Other | Attending: Physician Assistant | Admitting: Occupational Therapy

## 2021-10-26 ENCOUNTER — Ambulatory Visit: Payer: Medicare Other | Admitting: Physical Therapy

## 2021-10-27 ENCOUNTER — Ambulatory Visit (INDEPENDENT_AMBULATORY_CARE_PROVIDER_SITE_OTHER): Payer: Medicare Other

## 2021-10-27 ENCOUNTER — Other Ambulatory Visit: Payer: Self-pay

## 2021-10-27 VITALS — Wt 181.0 lb

## 2021-10-27 DIAGNOSIS — E7849 Other hyperlipidemia: Secondary | ICD-10-CM | POA: Diagnosis not present

## 2021-10-27 DIAGNOSIS — Z0001 Encounter for general adult medical examination with abnormal findings: Secondary | ICD-10-CM | POA: Diagnosis not present

## 2021-10-27 DIAGNOSIS — Z0000 Encounter for general adult medical examination without abnormal findings: Secondary | ICD-10-CM

## 2021-10-27 NOTE — Patient Instructions (Signed)

## 2021-10-27 NOTE — Progress Notes (Signed)
I connected with  Carrie Kline on 10/27/21 by a audio enabled telemedicine application and verified that I am speaking with the correct person using two identifiers.  Patient Location: Home  Provider Location: Home Office  I discussed the limitations of evaluation and management by telemedicine. The patient expressed understanding and agreed to proceed.    Subjective:   Carrie Kline is a 76 y.o. female who presents for an Initial Medicare Annual Wellness Visit.       Objective:    Today's Vitals   10/27/21 1304  Weight: 181 lb (82.1 kg)   Body mass index is 35.35 kg/m.  Advanced Directives 09/28/2021 08/28/2021 06/19/2021 03/31/2021 03/07/2021 03/01/2021 02/24/2021  Does Patient Have a Medical Advance Directive? No No No No No Yes No  Type of Advance Directive - - - - Midwife;Living will Healthcare Power of Attorney -  Does patient want to make changes to medical advance directive? - - - - Yes (Inpatient - patient requests chaplain consult to change a medical advance directive) Yes (Inpatient - patient requests chaplain consult to change a medical advance directive) -  Would patient like information on creating a medical advance directive? - No - Patient declined - - - Yes (Inpatient - patient requests chaplain consult to create a medical advance directive) No - Patient declined    Current Medications (verified) Outpatient Encounter Medications as of 10/27/2021  Medication Sig   albuterol (VENTOLIN HFA) 108 (90 Base) MCG/ACT inhaler Inhale 2 puffs into the lungs every 6 (six) hours as needed for wheezing or shortness of breath.   atorvastatin (LIPITOR) 40 MG tablet Take 1 tablet (40 mg total) by mouth daily.   clopidogrel (PLAVIX) 75 MG tablet Take 1 tablet (75 mg total) by mouth daily.   fluticasone (FLONASE) 50 MCG/ACT nasal spray Place 1 spray into both nostrils daily.   ondansetron (ZOFRAN-ODT) 4 MG disintegrating tablet Take 1 tablet (4 mg total) by mouth every 8  (eight) hours as needed for nausea or vomiting.   pantoprazole (PROTONIX) 40 MG tablet Take 1 tablet (40 mg total) by mouth daily.   amoxicillin-clavulanate (AUGMENTIN) 875-125 MG tablet Take 1 tablet by mouth every 12 (twelve) hours. (Patient not taking: Reported on 10/27/2021)   No facility-administered encounter medications on file as of 10/27/2021.    Allergies (verified) Codeine, Darvon [propoxyphene], Hydrocodone, and Hydrocortisone   History: Past Medical History:  Diagnosis Date   Gout    Seasonal allergies    Stroke [facility])    Past Surgical History:  Procedure Laterality Date   ABDOMINAL HYSTERECTOMY     BACK SURGERY     GANGLION CYST EXCISION     Family History  Problem Relation Age of Onset   Heart attack Father    Diabetes Sister    Thyroid disease Daughter    Social History   Socioeconomic History   Marital status: Widowed    Spouse name: Not on file   Number of children: Not on file   Years of education: Not on file   Highest education level: Not on file  Occupational History   Not on file  Tobacco Use   Smoking status: Never   Smokeless tobacco: Never  Vaping Use   Vaping Use: Never used  Substance and Sexual Activity   Alcohol use: No   Drug use: No   Sexual activity: Not Currently  Other Topics Concern   Not on file  Social History Narrative   Not on file  Social Determinants of Health   Financial Resource Strain: Low Risk    Difficulty of Paying Living Expenses: Not hard at all  Food Insecurity: No Food Insecurity   Worried About Charity fundraiser in the Last Year: Never true   [place] in the Last Year: Never true  Transportation Needs: No Transportation Needs   Lack of Transportation (Medical): No   Lack of Transportation (Non-Medical): No  Physical Activity: Sufficiently Active   Days of Exercise per Week: 7 days   Minutes of Exercise per Session: 60 min  Stress: No Stress Concern Present   Feeling of Stress : Not at all   Social Connections: Moderately Isolated   Frequency of Communication with Friends and Family: More than three times a week   Frequency of Social Gatherings with Friends and Family: More than three times a week   Attends Religious Services: More than 4 times per year   Active Member of [employer] or Organizations: No   Attends Archivist Meetings: Never   Marital Status: Widowed    Tobacco Counseling Counseling given: Yes   Clinical Intake:  Pre-visit preparation completed: Yes  Pain : No/denies pain        How often do you need to have someone help you when you read instructions, pamphlets, or other written materials from your doctor or pharmacy?: 3 - Sometimes What is the last grade level you completed in school?: College Graduate  Diabetic? No   Interpreter Needed?: No      Activities of Daily Living In your present state of health, do you have any difficulty performing the following activities: 10/27/2021  Hearing? N  Vision? Y  Comment wearing glasses  Difficulty concentrating or making decisions? N  Walking or climbing stairs? Y  Comment she reports some difficulty  Dressing or bathing? Y  Comment dressing she does on her own, bathing she has to have help  Doing errands, shopping? N  Comment she reports not doing tasks alone, she does not drive  Some recent data might be hidden    Patient Care Team: Dorna Mai, MD as PCP - General (Family Medicine)  Indicate any recent Medical Services you may have received from other than [facility] providers in the past year (date may be approximate).     Assessment:   This is a routine wellness examination for Carrie Kline.  Hearing/Vision screen No results found.  Dietary issues and exercise activities discussed:     Goals Addressed   None   Depression Screen PHQ 2/9 Scores 10/27/2021 09/28/2021 07/11/2021 07/05/2021 04/03/2021  PHQ - 2 Score 0 0 0 0 0  PHQ- 9 Score - - - 0 3    Fall Risk Fall Risk  10/27/2021  09/28/2021 07/11/2021 04/03/2021  Falls in the past year? 0 0 0 0  Number falls in past yr: 0 - - 0  Injury with Fall? 0 - - 0  Risk for fall due to : No Fall Risks - - -  Follow up Falls evaluation completed - - -    FALL RISK PREVENTION PERTAINING TO THE HOME:  Any stairs in or around the home? Yes  If so, are there any without handrails? No  Home free of loose throw rugs in walkways, pet beds, electrical cords, etc? Yes  Adequate lighting in your home to reduce risk of falls? Yes   ASSISTIVE DEVICES UTILIZED TO PREVENT FALLS:  Life alert? No  Use of a cane, walker or w/c?  Yes  Grab bars in the bathroom? No  Shower chair or bench in shower? Yes  Elevated toilet seat or a handicapped toilet? Yes   TIMED UP AND GO:  Was the test performed? No .  Length of time to ambulate 10 feet: n/a  sec.   Cognitive Function:     6CIT Screen 10/27/2021  What Year? 0 points  What month? 0 points  What time? 0 points  Count back from 20 0 points  Months in reverse 0 points  Repeat phrase 2 points  Total Score 2    Immunizations Immunization History  Administered Date(s) Administered   Influenza,inj,Quad PF,6+ Mos 07/05/2021   Moderna Sars-Covid-2 Vaccination 10/23/2019, 11/23/2019   PPD Test 05/02/2011    TDAP status: Due, Education has been provided regarding the importance of this vaccine. Advised may receive this vaccine at local pharmacy or Health Dept. Aware to provide a copy of the vaccination record if obtained from local pharmacy or Health Dept. Verbalized acceptance and understanding.  Flu Vaccine status: Due, Education has been provided regarding the importance of this vaccine. Advised may receive this vaccine at local pharmacy or Health Dept. Aware to provide a copy of the vaccination record if obtained from local pharmacy or Health Dept. Verbalized acceptance and understanding.  Pneumococcal vaccine status: Due, Education has been provided regarding the importance of this  vaccine. Advised may receive this vaccine at local pharmacy or Health Dept. Aware to provide a copy of the vaccination record if obtained from local pharmacy or Health Dept. Verbalized acceptance and understanding.  Covid-19 vaccine status: Information provided on how to obtain vaccines.   Qualifies for Shingles Vaccine? Yes   Zostavax completed No   Shingrix Completed?: No.    Education has been provided regarding the importance of this vaccine. Patient has been advised to call insurance company to determine out of pocket expense if they have not yet received this vaccine. Advised may also receive vaccine at local pharmacy or Health Dept. Verbalized acceptance and understanding.  Screening Tests Health Maintenance  Topic Date Due   FOOT EXAM  Never done   OPHTHALMOLOGY EXAM  Never done   URINE MICROALBUMIN  Never done   Hepatitis C Screening  Never done   TETANUS/TDAP  Never done   COLONOSCOPY (Pts 45-58yrs Insurance coverage will need to be confirmed)  Never done   Zoster Vaccines- Shingrix (1 of 2) Never done   Pneumonia Vaccine 21+ Years old (1 - PCV) Never done   COVID-19 Vaccine (3 - Booster for Moderna series) 01/18/2020   HEMOGLOBIN A1C  08/27/2021   MAMMOGRAM  01/25/2022 (Originally 07/07/1997)   DEXA SCAN  07/05/2022 (Originally 07/07/2012)   INFLUENZA VACCINE  Completed   HPV VACCINES  Aged Out    Health Maintenance  Health Maintenance Due  Topic Date Due   FOOT EXAM  Never done   OPHTHALMOLOGY EXAM  Never done   URINE MICROALBUMIN  Never done   Hepatitis C Screening  Never done   TETANUS/TDAP  Never done   COLONOSCOPY (Pts 45-61yrs Insurance coverage will need to be confirmed)  Never done   Zoster Vaccines- Shingrix (1 of 2) Never done   Pneumonia Vaccine 37+ Years old (1 - PCV) Never done   COVID-19 Vaccine (3 - Booster for Moderna series) 01/18/2020   HEMOGLOBIN A1C  08/27/2021    Colorectal cancer screening: No longer required.   Mammogram status: No longer  required due to pt declined mammogram at this time .  Lung Cancer Screening: (Low Dose CT Chest recommended if Age 39-80 years, 30 pack-year currently smoking OR have quit w/in 15years.) does not qualify.   Lung Cancer Screening Referral: n/a  Additional Screening:  Hepatitis C Screening: does qualify; Completed order put in to be completed.  Vision Screening: Recommended annual ophthalmology exams for early detection of glaucoma and other disorders of the eye. Is the patient up to date with their annual eye exam?  No  Who is the provider or what is the name of the office in which the patient attends annual eye exams? N/a If pt is not established with a provider, would they like to be referred to a provider to establish care? No .   Dental Screening: Recommended annual dental exams for proper oral hygiene  Community Resource Referral / Chronic Care Management: CRR required this visit?  No   CCM required this visit?  No      Plan:     I have personally reviewed and noted the following in the patients chart:   Medical and social history Use of alcohol, tobacco or illicit drugs  Current medications and supplements including opioid prescriptions. Patient is not currently taking opioid prescriptions. Functional ability and status Nutritional status Physical activity Advanced directives List of other physicians Hospitalizations, surgeries, and ER visits in previous 12 months Vitals Screenings to include cognitive, depression, and falls Referrals and appointments  In addition, I have reviewed and discussed with patient certain preventive protocols, quality metrics, and best practice recommendations. A written personalized care plan for preventive services as well as general preventive health recommendations were provided to patient.     Debbora Dus, [place]   10/27/2021   Nurse Notes: Pt informed, upcoming appointment. She denied all screenings, pt aware. Went over pts  HM, what has been future ordered she agreed to doing at appointment.

## 2021-11-27 ENCOUNTER — Telehealth: Payer: Self-pay | Admitting: Family Medicine

## 2021-11-27 NOTE — Telephone Encounter (Signed)
Called pt to inquire if Disability form is still needed, if so to schedule an upcoming appt w/ PCP for this. No answer.  ?

## 2021-12-15 ENCOUNTER — Telehealth: Payer: Self-pay

## 2021-12-15 NOTE — Telephone Encounter (Signed)
Patient's daughter called to say she broke her brace she is needing a new one. She has the Right AFO. Please advise ?

## 2021-12-20 NOTE — Telephone Encounter (Signed)
error 

## 2021-12-26 ENCOUNTER — Encounter: Payer: Self-pay | Admitting: Physical Medicine & Rehabilitation

## 2021-12-26 ENCOUNTER — Encounter: Payer: Medicare Other | Attending: Registered Nurse | Admitting: Physical Medicine & Rehabilitation

## 2021-12-26 VITALS — BP 133/82 | HR 88 | Temp 98.1°F | Ht 60.0 in | Wt 193.0 lb

## 2021-12-26 DIAGNOSIS — M21371 Foot drop, right foot: Secondary | ICD-10-CM | POA: Insufficient documentation

## 2021-12-26 DIAGNOSIS — I69351 Hemiplegia and hemiparesis following cerebral infarction affecting right dominant side: Secondary | ICD-10-CM | POA: Insufficient documentation

## 2021-12-26 NOTE — Progress Notes (Signed)
? ?Subjective:  ? ? Patient ID: Carrie Kline, female    DOB: [birth year], 76 y.o.   MRN: [id number] ? 76 y.o. right-handed female with history of gout as well as back surgery.  Per chart review lives with her children.  Independent prior to admission.  Presented 02/24/2021 with acute onset of right side weakness mild slurred speech.  CT/MRI showed acute infarct left pons.  Slight edema without mass-effect.  Patient did not receive tPA.  CT angiogram head and neck negative for large vessel occlusion.  Echocardiogram with ejection fraction of 55 to 60% no wall motion abnormalities.  Admission chemistries unremarkable except glucose 240 hemoglobin A1c 7.4.  Maintained on aspirin as well as Plavix for CVA prophylaxis x3 weeks total then aspirin alone.  Subcutaneous Lovenox for DVT prophylaxis.  Incidental findings thyroid nodule ultrasound of thyroid showing parenchymal echotexture recommend outpatient follow-up.  Patient tolerating a regular diet.  Glucophage initiated for new findings of diabetes mellitus.  Due to patient's right side weakness therapy evaluations completed was admitted for a comprehensive rehab program.  ?Admit date: 03/01/2021 ?Discharge date: 03/24/2021 ?HPI ? ?Left elbow pain ?Soreness a couple days ago, does not recall injury, tried lidocaine patch on it yesterday , feels better today , no numbness or tingling no neck pain  ?Broke RIght carbon fiber AFO  ?Last PT note 10/19/21 ?Ambulated 1 x 100' without AD, CGA,  ? ?Pt now mod I with cane ?Pain Inventory ?Average Pain 0 ?Pain Right Now 7 ?My pain is intermittent and throbs ? ?LOCATION OF PAIN  left elbow ? ?BOWEL ?Number of stools per week: 7 ? ? ? ?BLADDER ?Normal and Pads (at night) ? ?Frequent urination Yes  ? ?Mobility ?use a cane ?ability to climb steps?  yes ?do you drive?  no ?Do you have any goals in this area?  yes ? ?Function ?retired ?Do you have any goals in this area?  yes ? ?Neuro/Psych ?weakness ?trouble walking ? ?Prior Studies ?Any  changes since last visit?  no ? ?Physicians involved in your care ?Any changes since last visit?  no ? ? ?Family History  ?Problem Relation Age of Onset  ? Heart attack Father   ? Diabetes Sister   ? Thyroid disease Daughter   ? ?Social History  ? ?Socioeconomic History  ? Marital status: Widowed  ?  Spouse name: Not on file  ? Number of children: Not on file  ? Years of education: Not on file  ? Highest education level: Not on file  ?Occupational History  ? Not on file  ?Tobacco Use  ? Smoking status: Never  ? Smokeless tobacco: Never  ?Vaping Use  ? Vaping Use: Never used  ?Substance and Sexual Activity  ? Alcohol use: No  ? Drug use: No  ? Sexual activity: Not Currently  ?Other Topics Concern  ? Not on file  ?Social History Narrative  ? Not on file  ? ?Social Determinants of Health  ? ?Financial Resource Strain: Low Risk   ? Difficulty of Paying Living Expenses: Not hard at all  ?Food Insecurity: No Food Insecurity  ? Worried About Programme researcher, broadcasting/film/videounning Out of Food in the Last Year: Never true  ? Ran Out of Food in the Last Year: Never true  ?Transportation Needs: No Transportation Needs  ? Lack of Transportation (Medical): No  ? Lack of Transportation (Non-Medical): No  ?Physical Activity: Sufficiently Active  ? Days of Exercise per Week: 7 days  ? Minutes of Exercise per Session: 60 min  ?  Stress: No Stress Concern Present  ? Feeling of Stress : Not at all  ?Social Connections: Moderately Isolated  ? Frequency of Communication with Friends and Family: More than three times a week  ? Frequency of Social Gatherings with Friends and Family: More than three times a week  ? Attends Religious Services: More than 4 times per year  ? Active Member of Clubs or Organizations: No  ? Attends Banker Meetings: Never  ? Marital Status: Widowed  ? ?Past Surgical History:  ?Procedure Laterality Date  ? ABDOMINAL HYSTERECTOMY    ? BACK SURGERY    ? GANGLION CYST EXCISION    ? ?Past Medical History:  ?Diagnosis Date  ? Gout   ?  Seasonal allergies   ? Stroke [facility])   ? ?Ht 5' (1.524 m)   BMI 35.35 kg/m?  ? ?Opioid Risk Score:   ?Fall Risk Score:  `1 ? ?Depression screen PHQ 2/9 ? ? ?  10/27/2021  ?  1:11 PM 09/28/2021  ?  3:39 PM 07/11/2021  ?  3:24 PM 07/05/2021  ? 10:06 AM 04/03/2021  ?  1:45 PM  ?Depression screen PHQ 2/9  ?Decreased Interest 0 0 0 0 0  ?Down, Depressed, Hopeless 0 0 0 0 0  ?PHQ - 2 Score 0 0 0 0 0  ?Altered sleeping    0 0  ?Tired, decreased energy    0 3  ?Change in appetite    0 0  ?Feeling bad or failure about yourself     0 0  ?Trouble concentrating    0 0  ?Moving slowly or fidgety/restless    0 0  ?Suicidal thoughts    0 0  ?PHQ-9 Score    0 3  ?  ?Review of Systems  ?Musculoskeletal:  Positive for gait problem.  ?     Right elbow pain  ?All other systems reviewed and are negative. ? ?   ?Objective:  ? Physical Exam ?Vitals and nursing note reviewed.  ?Eyes:  ?   Extraocular Movements: Extraocular movements intact.  ?   Conjunctiva/sclera: Conjunctivae normal.  ?   Pupils: Pupils are equal, round, and reactive to light.  ?Musculoskeletal:     ?   General: No tenderness.  ?   Right lower leg: No edema.  ?   Left lower leg: No edema.  ?   Comments: Left olecranon bursa with mild thickening but no significant fluid accumulation, nontender to palpation ?No pain with left elbow range of motion ?No evidence of erythema or effusion.  ?Skin: ?   General: Skin is warm and dry.  ?Neurological:  ?   Mental Status: She is alert and oriented to person, place, and time.  ?Psychiatric:     ?   Mood and Affect: Mood normal.     ?   Behavior: Behavior normal.  ? ?Motor strength is 4/5 in the right deltoid by stress of grip hip flexor knee extensor 3 - right ankle dorsiflexor ?5/5 in the left deltoid bicep tricep grip hip flexor knee extensor ankle dorsiflexor ? ? ? ?   ?Assessment & Plan:  ?1.  Right spastic hemiparesis weakness muscle group is right ankle dorsiflexors.  Unfortunately her carbon fiber AFO broke by accident and will need  to be replaced, prescription written for [facility] orthotics and prosthetics.  The patient needs the AFO to prevent injury and reduce fall risk ?I will see the patient back in 6 months ?No need for formal rehabilitation  at this time, continue home exercise program ? ?

## 2021-12-26 NOTE — Patient Instructions (Signed)
May try Zyrtec or claritin for sinus congestion  ?

## 2022-01-02 ENCOUNTER — Ambulatory Visit (INDEPENDENT_AMBULATORY_CARE_PROVIDER_SITE_OTHER): Payer: Medicare Other | Admitting: Family Medicine

## 2022-01-02 ENCOUNTER — Encounter: Payer: Self-pay | Admitting: Family Medicine

## 2022-01-02 VITALS — BP 125/84 | HR 88 | Temp 98.1°F | Resp 16 | Wt 193.0 lb

## 2022-01-02 DIAGNOSIS — E7849 Other hyperlipidemia: Secondary | ICD-10-CM | POA: Diagnosis not present

## 2022-01-02 DIAGNOSIS — Z8673 Personal history of transient ischemic attack (TIA), and cerebral infarction without residual deficits: Secondary | ICD-10-CM | POA: Diagnosis not present

## 2022-01-04 NOTE — Progress Notes (Signed)
? ?Established Patient Office Visit ? ?Subjective   ? ?Patient ID: Carrie Kline, female    DOB: 1946/08/14  Age: 76 y.o. MRN: [id number] ? ?CC:  ?Chief Complaint  ?Patient presents with  ? Follow-up  ?  6 months  ? ? ?HPI ?Carrie Kline presents for routine follow up. Patient is now months status post CVA and reports that she continues to improve and is very near her pre-CVA baseline.  ? ? ?Outpatient Encounter Medications as of 01/02/2022  ?Medication Sig  ? albuterol (VENTOLIN HFA) 108 (90 Base) MCG/ACT inhaler Inhale 2 puffs into the lungs every 6 (six) hours as needed for wheezing or shortness of breath.  ? atorvastatin (LIPITOR) 40 MG tablet Take by mouth.  ? clopidogrel (PLAVIX) 75 MG tablet Take 1 tablet (75 mg total) by mouth daily.  ? fluticasone (FLONASE) 50 MCG/ACT nasal spray Administer 1 spray in each nostril daily as needed.  ? hydrOXYzine (ATARAX) 25 MG tablet Take 25 mg by mouth 4 (four) times daily.  ? ?No facility-administered encounter medications on file as of 01/02/2022.  ? ? ?Past Medical History:  ?Diagnosis Date  ? Gout   ? Seasonal allergies   ? Stroke [facility])   ? ? ?Past Surgical History:  ?Procedure Laterality Date  ? ABDOMINAL HYSTERECTOMY    ? BACK SURGERY    ? GANGLION CYST EXCISION    ? ? ?Family History  ?Problem Relation Age of Onset  ? Heart attack Father   ? Diabetes Sister   ? Thyroid disease Daughter   ? ? ?Social History  ? ?Socioeconomic History  ? Marital status: Widowed  ?  Spouse name: Not on file  ? Number of children: Not on file  ? Years of education: Not on file  ? Highest education level: Not on file  ?Occupational History  ? Not on file  ?Tobacco Use  ? Smoking status: Never  ? Smokeless tobacco: Never  ?Vaping Use  ? Vaping Use: Never used  ?Substance and Sexual Activity  ? Alcohol use: No  ? Drug use: No  ? Sexual activity: Not Currently  ?Other Topics Concern  ? Not on file  ?Social History Narrative  ? Not on file  ? ?Social Determinants of Health  ? ?Financial Resource  Strain: Low Risk   ? Difficulty of Paying Living Expenses: Not hard at all  ?Food Insecurity: No Food Insecurity  ? Worried About Programme researcher, broadcasting/film/video in the Last Year: Never true  ? Ran Out of Food in the Last Year: Never true  ?Transportation Needs: No Transportation Needs  ? Lack of Transportation (Medical): No  ? Lack of Transportation (Non-Medical): No  ?Physical Activity: Sufficiently Active  ? Days of Exercise per Week: 7 days  ? Minutes of Exercise per Session: 60 min  ?Stress: No Stress Concern Present  ? Feeling of Stress : Not at all  ?Social Connections: Moderately Isolated  ? Frequency of Communication with Friends and Family: More than three times a week  ? Frequency of Social Gatherings with Friends and Family: More than three times a week  ? Attends Religious Services: More than 4 times per year  ? Active Member of Clubs or Organizations: No  ? Attends Banker Meetings: Never  ? Marital Status: Widowed  ?Intimate Partner Violence: Not At Risk  ? Fear of Current or Ex-Partner: No  ? Emotionally Abused: No  ? Physically Abused: No  ? Sexually Abused: No  ? ? ?Review of  Systems  ?All other systems reviewed and are negative. ? ?  ? ? ?Objective   ? ?BP 125/84   Pulse 88   Temp 98.1 ?F (36.7 ?C) (Oral)   Resp 16   Wt 193 lb (87.5 kg)   SpO2 96%   BMI 37.69 kg/m?  ? ?Physical Exam ?Vitals and nursing note reviewed.  ?Constitutional:   ?   General: She is not in acute distress. ?HENT:  ?   Head: Normocephalic and atraumatic.  ?Cardiovascular:  ?   Rate and Rhythm: Normal rate and regular rhythm.  ?Pulmonary:  ?   Effort: Pulmonary effort is normal.  ?   Breath sounds: Normal breath sounds.  ?Abdominal:  ?   Palpations: Abdomen is soft.  ?   Tenderness: There is no abdominal tenderness.  ?Musculoskeletal:  ?   Comments: Utilizing cane  ?Neurological:  ?   Mental Status: She is alert and oriented to person, place, and time.  ?   Motor: Weakness: much improved - minimal to no residual  weakness noted.  ?Psychiatric:     ?   Mood and Affect: Mood normal.     ?   Behavior: Behavior normal.  ? ? ? ?  ? ?Assessment & Plan:  ? ?1. Status post CVA ?Much improved. Continue present management (therapy completed) ? ?2. Other hyperlipidemia ?Continue present management ? ? ? ?No follow-ups on file.  ? ?Tommie Raymond, MD ? ? ?

## 2022-01-09 ENCOUNTER — Ambulatory Visit: Payer: Medicare Other | Admitting: Family Medicine

## 2022-01-31 ENCOUNTER — Ambulatory Visit: Payer: Medicare Other | Admitting: Adult Health

## 2022-02-10 ENCOUNTER — Other Ambulatory Visit: Payer: Self-pay | Admitting: Family Medicine

## 2022-02-12 NOTE — Telephone Encounter (Signed)
Not on current med list. Requested Prescriptions  Pending Prescriptions Disp Refills  . pantoprazole (PROTONIX) 40 MG tablet [Pharmacy Med Name: PANTOPRAZOLE 40MG  TABLETS] 30 tablet 0    Sig: TAKE 1 TABLET(40 MG) BY MOUTH DAILY     Gastroenterology: Proton Pump Inhibitors Passed - 02/10/2022  5:32 AM      Passed - Valid encounter within last 12 months    Recent Outpatient Visits          1 month ago Status post CVA   Primary Care at [facility], MD   5 months ago COVID-19   Primary Care at [facility], NP   7 months ago Acute cerebral infarction [facility])   Primary Care at [facility], MD   10 months ago Aphasia S/P CVA   Primary Care at [facility], MD      Future Appointments            In 4 months [facility], MD Primary Care at [facility]

## 2022-02-27 ENCOUNTER — Other Ambulatory Visit: Payer: Self-pay | Admitting: Family Medicine

## 2022-02-28 NOTE — Telephone Encounter (Signed)
Requested Prescriptions  Pending Prescriptions Disp Refills  . albuterol (VENTOLIN HFA) 108 (90 Base) MCG/ACT inhaler [Pharmacy Med Name: ALBUTEROL HFA INH (200 PUFFS) 6.7GM] 6.7 g 1    Sig: INHALE 2 PUFFS INTO THE LUNGS EVERY 6 HOURS AS NEEDED FOR WHEEZING OR SHORTNESS OF BREATH     Pulmonology:  Beta Agonists 2 Passed - 02/27/2022  3:30 AM      Passed - Last BP in normal range    BP Readings from Last 1 Encounters:  01/02/22 125/84         Passed - Last Heart Rate in normal range    Pulse Readings from Last 1 Encounters:  01/02/22 88         Passed - Valid encounter within last 12 months    Recent Outpatient Visits          1 month ago Status post CVA   Primary Care at [facility], MD   5 months ago COVID-19   Primary Care at [facility], Gildardo Pounds, NP   7 months ago Acute cerebral infarction [facility])   Primary Care at [facility], MD   11 months ago Aphasia S/P CVA   Primary Care at [facility], MD      Future Appointments            In 4 months Georganna Skeans, MD Primary Care at [facility]

## 2022-03-03 ENCOUNTER — Encounter (HOSPITAL_BASED_OUTPATIENT_CLINIC_OR_DEPARTMENT_OTHER): Payer: Self-pay | Admitting: Emergency Medicine

## 2022-03-03 ENCOUNTER — Emergency Department (HOSPITAL_BASED_OUTPATIENT_CLINIC_OR_DEPARTMENT_OTHER): Payer: Medicare Other

## 2022-03-03 ENCOUNTER — Emergency Department (HOSPITAL_BASED_OUTPATIENT_CLINIC_OR_DEPARTMENT_OTHER)
Admission: EM | Admit: 2022-03-03 | Discharge: 2022-03-03 | Disposition: A | Discharge status: Home / Self Care | Payer: Medicare Other | Attending: Emergency Medicine | Admitting: Emergency Medicine

## 2022-03-03 ENCOUNTER — Other Ambulatory Visit: Payer: Self-pay

## 2022-03-03 DIAGNOSIS — Z7902 Long term (current) use of antithrombotics/antiplatelets: Secondary | ICD-10-CM | POA: Diagnosis not present

## 2022-03-03 DIAGNOSIS — M79605 Pain in left leg: Secondary | ICD-10-CM | POA: Diagnosis not present

## 2022-03-03 DIAGNOSIS — R531 Weakness: Secondary | ICD-10-CM | POA: Insufficient documentation

## 2022-03-03 LAB — BASIC METABOLIC PANEL
Anion gap: 5 (ref 5–15)
BUN: 24 mg/dL — ABNORMAL HIGH (ref 8–23)
CO2: 25 mmol/L (ref 22–32)
Calcium: 10 mg/dL (ref 8.9–10.3)
Chloride: 109 mmol/L (ref 98–111)
Creatinine, Ser: 0.91 mg/dL (ref 0.44–1.00)
GFR, Estimated: >60 mL/min (ref >60)
Glucose, Bld: 124 mg/dL — ABNORMAL HIGH (ref 70–99)
Potassium: 4 mmol/L (ref 3.5–5.1)
Sodium: 139 mmol/L (ref 135–145)

## 2022-03-03 LAB — CBC WITH DIFFERENTIAL/PLATELET
Abs Immature Granulocytes: 0.03 10*3/uL (ref 0.00–0.07)
Basophils Absolute: 0 10*3/uL (ref 0.0–0.1)
Basophils Relative: 0 %
Eosinophils Absolute: 0.1 10*3/uL (ref 0.0–0.5)
Eosinophils Relative: 2 %
HCT: 39.6 % (ref 36.0–46.0)
Hemoglobin: 13 g/dL (ref 12.0–15.0)
Immature Granulocytes: 1 %
Lymphocytes Relative: 27 %
Lymphs Abs: 1.5 10*3/uL (ref 0.7–4.0)
MCH: 29.1 pg (ref 26.0–34.0)
MCHC: 32.8 g/dL (ref 30.0–36.0)
MCV: 88.8 fL (ref 80.0–100.0)
Monocytes Absolute: 0.3 10*3/uL (ref 0.1–1.0)
Monocytes Relative: 6 %
Neutro Abs: 3.5 10*3/uL (ref 1.7–7.7)
Neutrophils Relative %: 64 %
Platelets: 188 10*3/uL (ref 150–400)
RBC: 4.46 MIL/uL (ref 3.87–5.11)
RDW: 13 % (ref 11.5–15.5)
WBC: 5.5 10*3/uL (ref 4.0–10.5)
nRBC: 0 % (ref 0.0–0.2)

## 2022-03-03 LAB — CK: Total CK: 80 U/L (ref 38–234)

## 2022-03-03 MED ORDER — PREDNISONE 50 MG PO TABS: ORAL_TABLET | ORAL | 0 refills | Status: DC

## 2022-03-03 MED ORDER — ACETAMINOPHEN 325 MG PO TABS
650.0000 mg | ORAL_TABLET | Freq: Once | ORAL | Status: AC
  Administered 2022-03-03: 650 mg via ORAL
  Filled 2022-03-03: qty 2

## 2022-03-03 NOTE — ED Triage Notes (Signed)
Pt reports L upper thigh pain for the past few days. Denies any injury.

## 2022-03-03 NOTE — ED Notes (Signed)
Patient transported to X-ray 

## 2022-03-03 NOTE — Discharge Instructions (Signed)
There is no evidence of fracture.  There is no evidence of blood clot.  Keep yourself hydrated and follow-up with your primary doctor.  Return to the ED with worsening pain, weakness, numbness, tingling, or any other concerns

## 2022-03-03 NOTE — ED Provider Notes (Addendum)
[facility] EMERGENCY DEPARTMENT Provider Note   CSN: [id number] Arrival date & time: 03/03/22  1237     History  Chief Complaint  Patient presents with   Leg Pain    Carrie Kline is a 76 y.o. female.  Patient with previous stroke and residual right-sided weakness here with pain to her left upper leg and thigh area for the past 3 days.  States pain started while she was walking out of the bathroom on Thursday evening.  Did not fall.  Pain is to her lateral and anterior left thigh does not radiate down her leg.  No associated weakness, numbness or tingling.  No bowel or bladder incontinence.  No fever or vomiting.  She takes Plavix but no other blood thinners.  Her right-sided weakness is at baseline. Had back surgery remotely but denies any back issues currently.  No abdominal pain, chest pain or shortness of breath.  The history is provided by the patient.  Leg Pain Associated symptoms: no back pain and no fever        Home Medications Prior to Admission medications   Medication Sig Start Date End Date Taking? Authorizing Provider  albuterol (VENTOLIN HFA) 108 (90 Base) MCG/ACT inhaler INHALE 2 PUFFS INTO THE LUNGS EVERY 6 HOURS AS NEEDED FOR WHEEZING OR SHORTNESS OF BREATH 02/28/22   Georganna Skeans, MD  atorvastatin (LIPITOR) 40 MG tablet Take by mouth.    [provider]  clopidogrel (PLAVIX) 75 MG tablet Take 1 tablet (75 mg total) by mouth daily. 08/01/21   Ihor Austin, NP  fluticasone (FLONASE) 50 MCG/ACT nasal spray Administer 1 spray in each nostril daily as needed. 08/28/21   [provider]  hydrOXYzine (ATARAX) 25 MG tablet Take 25 mg by mouth 4 (four) times daily. 12/14/21   [provider]      Allergies    Codeine, Hydrocodone, Hydrocortisone, and Propoxyphene    Review of Systems   Review of Systems  Constitutional:  Negative for activity change, appetite change and fever.  HENT:  Negative for congestion and rhinorrhea.    Respiratory:  Negative for cough, chest tightness and shortness of breath.   Cardiovascular:  Negative for chest pain.  Gastrointestinal:  Negative for abdominal pain, nausea and vomiting.  Genitourinary:  Negative for dysuria and hematuria.  Musculoskeletal:  Positive for arthralgias and myalgias. Negative for back pain.  Skin:  Negative for rash.  Neurological:  Negative for dizziness, weakness and headaches.   all other systems are negative except as noted in the HPI and PMH.    Physical Exam Updated Vital Signs BP 134/71   Pulse 89   Temp 98.4 F (36.9 C) (Oral)   Resp 16   SpO2 97%  Physical Exam Vitals and nursing note reviewed.  Constitutional:      General: She is not in acute distress.    Appearance: She is well-developed.  HENT:     Head: Normocephalic and atraumatic.     Mouth/Throat:     Pharynx: No oropharyngeal exudate.  Eyes:     Conjunctiva/sclera: Conjunctivae normal.     Pupils: Pupils are equal, round, and reactive to light.  Neck:     Comments: No meningismus. Cardiovascular:     Rate and Rhythm: Normal rate and regular rhythm.     Heart sounds: Normal heart sounds. No murmur heard. Pulmonary:     Effort: Pulmonary effort is normal. No respiratory distress.     Breath sounds: Normal breath sounds.  Abdominal:  Palpations: Abdomen is soft.     Tenderness: There is no abdominal tenderness. There is no guarding or rebound.  Musculoskeletal:        General: Tenderness present. Normal range of motion.     Cervical back: Normal range of motion and neck supple.     Comments: Tenderness to left anterior lateral thigh.  Able to flex and extend hip without difficulty.  5/5 strength on the left ankle plantar and dorsiflexion intact. R sided weakness at baseline. Great toe extension intact bilaterally. +2 DP and PT pulses. +2 patellar reflexes bilaterally.   Skin:    General: Skin is warm.  Neurological:     Mental Status: She is alert and oriented to  person, place, and time.     Cranial Nerves: No cranial nerve deficit.     Motor: No abnormal muscle tone.     Coordination: Coordination normal.     Comments:  5/5 strength throughout. CN 2-12 intact.Equal grip strength.   Psychiatric:        Behavior: Behavior normal.     ED Results / Procedures / Treatments   Labs (all labs ordered are listed, but only abnormal results are displayed) Labs Reviewed  BASIC METABOLIC PANEL - Abnormal; Notable for the following components:      Result Value   Glucose, Bld 124 (*)    BUN 24 (*)    All other components within normal limits  CBC WITH DIFFERENTIAL/PLATELET  CK    EKG None  Radiology DG Hip Unilat W or Wo Pelvis 2-3 Views Left  Result Date: 03/03/2022 CLINICAL DATA:  Leg pain left upper thigh pain, no reported injury EXAM: DG HIP (WITH OR WITHOUT PELVIS) 2-3V LEFT COMPARISON:  None Available. FINDINGS: There is no evidence of displaced hip fracture or dislocation. Mild, symmetric acetabular osteophytosis with a generally preserved superior joint space. Nonobstructive pattern of overlying bowel gas. IMPRESSION: No displaced fracture or dislocation of the left hip. Mild, symmetric acetabular osteophytosis. Electronically Signed   By: Jearld Lesch M.D.   On: 03/03/2022 15:04   DG Lumbar Spine Complete  Result Date: 03/03/2022 CLINICAL DATA:  Left upper thigh pain for the past few days. No injury. EXAM: LUMBAR SPINE - COMPLETE 4+ VIEW COMPARISON:  CT abdomen pelvis dated September 07, 2021. FINDINGS: Five lumbar type vertebral bodies. No acute fracture or subluxation. Vertebral body heights are preserved. Alignment is normal. Unchanged interbody ankylosis at L5-S1. Unchanged mild multilevel disc height loss at the remaining lumbar levels. Unchanged advanced lower lumbar facet arthropathy. Unchanged partial ankylosis of the sacroiliac joints. IMPRESSION: 1. Unchanged multilevel lumbar spondylosis. Electronically Signed   By: Obie Dredge M.D.    On: 03/03/2022 15:02    Procedures Procedures    Medications Ordered in ED Medications  acetaminophen (TYLENOL) tablet 650 mg (has no administration in time range)    ED Course/ Medical Decision Making/ A&P                           Medical Decision Making Amount and/or Complexity of Data Reviewed Labs: ordered. Radiology: ordered and independent interpretation performed. Decision-making details documented in ED Course. ECG/medicine tests: ordered and independent interpretation performed. Decision-making details documented in ED Course.  Risk OTC drugs. Prescription drug management.   Left thigh pain for the past 3 days without significant injury.  No fever.  Neurovascular intact with intact pulses, sensation and reflexes  Low suspicion for cord compression or cauda equina.  Screening x-rays of left hip and lumbar spine are negative for fracture.  Results reviewed interpreted by me.  Electrolytes are reassuring with normal CK.  Doppler obtained to evaluate for DVT  Care to be transferred at shift change with ultrasound pending.  Anticipate discharge home with supportive care and PCP follow-up.  Doppler negative for DVT.  Patient able to ambulate.  Low suspicion for cord compression or cauda equina.  Patient will be treated with course of steroids for suspected likely neuropathic or muscular pain.         Final Clinical Impression(s) / ED Diagnoses Final diagnoses:  Left leg pain    Rx / DC Orders ED Discharge Orders     None         Fatih Stalvey, Jeannett Senior, MD 03/03/22 1534    Glynn Octave, MD 03/03/22 1541

## 2022-03-07 ENCOUNTER — Ambulatory Visit: Payer: Self-pay | Admitting: *Deleted

## 2022-03-07 NOTE — Telephone Encounter (Signed)
  Chief Complaint: requesting pain medication  Symptoms: left thigh pain now moved to buttocks and hip. Comes and goes. Seen in ED 03/03/22 and completed course of prednisone today. Reports pain is severe at times. Using heat with some relief .  Frequency: 03/03/22 Pertinent Negatives: Patient denies severe pain now . Can walk. No swelling no redness.  Disposition: [] ED /[] Urgent Care (no appt availability in office) / [x] Appointment(In office/virtual)/ []  [facility]/ [] Home Care/ [] Refused Recommended Disposition /[] [facility]/ []  Follow-up with PCP Additional Notes:   Requesting a call back if pcp can give pain medication prior to OV 03/13/22   Reason for Disposition  [1] MILD pain (e.g., does not interfere with normal activities) AND [2] present > 7 days  Answer Assessment - Initial Assessment Questions 1. ONSET: "When did the pain start?"      03/03/22 2. LOCATION: "Where is the pain located?"      Left thigh pain moved  to buttocks and hip  3. PAIN: "How bad is the pain?"    (Scale 1-10; or mild, moderate, severe)   -  MILD (1-3): doesn't interfere with normal activities    -  MODERATE (4-7): interferes with normal activities (e.g., work or school) or awakens from sleep, limping    -  SEVERE (8-10): excruciating pain, unable to do any normal activities, unable to walk     Moderate pain when pain hits  4. WORK OR EXERCISE: "Has there been any recent work or exercise that involved this part of the body?"      na 5. CAUSE: "What do you think is causing the leg pain?"     Not sure went to ED 03/03/22 finished prednisone today  6. OTHER SYMPTOMS: "Do you have any other symptoms?" (e.g., chest pain, back pain, breathing difficulty, swelling, rash, fever, numbness, weakness)     Left buttocks and hip pain comes and goes  7. PREGNANCY: "Is there any chance you are pregnant?" "When was your last menstrual period?"     na  Protocols used: Leg Pain-A-AH

## 2022-03-08 NOTE — Telephone Encounter (Signed)
Please advise regarding medication

## 2022-03-13 ENCOUNTER — Encounter: Payer: Self-pay | Admitting: Physician Assistant

## 2022-03-13 ENCOUNTER — Ambulatory Visit (INDEPENDENT_AMBULATORY_CARE_PROVIDER_SITE_OTHER): Payer: Medicare Other | Admitting: Physician Assistant

## 2022-03-13 VITALS — BP 156/95 | HR 93 | Resp 18 | Ht 63.0 in | Wt 197.0 lb

## 2022-03-13 DIAGNOSIS — M5442 Lumbago with sciatica, left side: Secondary | ICD-10-CM

## 2022-03-13 DIAGNOSIS — R030 Elevated blood-pressure reading, without diagnosis of hypertension: Secondary | ICD-10-CM

## 2022-03-13 MED ORDER — PREDNISONE 20 MG PO TABS: ORAL_TABLET | ORAL | 0 refills | Status: AC

## 2022-03-13 NOTE — Progress Notes (Signed)
Established Patient Office Visit  Subjective   Patient ID: Carrie Kline, female    DOB: 1946-03-07  Age: 76 y.o. MRN: [id number]  Chief Complaint  Patient presents with   Leg Pain    Left leg pain, shooting up and down from hip to thigh    States that she went to the emergency department on March 03, 2022.  Emergency department course:    Carrie Kline is a 76 y.o. female.   Patient with previous stroke and residual right-sided weakness here with pain to her left upper leg and thigh area for the past 3 days.  States pain started while she was walking out of the bathroom on Thursday evening.  Did not fall.  Pain is to her lateral and anterior left thigh does not radiate down her leg.  No associated weakness, numbness or tingling.  No bowel or bladder incontinence.  No fever or vomiting.  She takes Plavix but no other blood thinners.  Her right-sided weakness is at baseline. Had back surgery remotely but denies any back issues currently.  No abdominal pain, chest pain or shortness of breath.   The history is provided by the patient.  Leg Pain Associated symptoms: no back pain and no fever     Risk OTC drugs. Prescription drug management.     Left thigh pain for the past 3 days without significant injury.  No fever.  Neurovascular intact with intact pulses, sensation and reflexes   Low suspicion for cord compression or cauda equina.   Screening x-rays of left hip and lumbar spine are negative for fracture.  Results reviewed interpreted by me.  Electrolytes are reassuring with normal CK.   Doppler obtained to evaluate for DVT   Care to be transferred at shift change with ultrasound pending.  Anticipate discharge home with supportive care and PCP follow-up.   Doppler negative for DVT.  Patient able to ambulate.  Low suspicion for cord compression or cauda equina.  Patient will be treated with course of steroids for suspected likely neuropathic or muscular pain.    States today that  she did complete the course of steroids.  States that she noticed a small amount of relief while taking them.  States that she has also been doing physical therapy exercises at home.  States that the pain seems to be more towards her left lower back.  States the pain does come and go frequently throughout the day, states when the pain is present it is a 10 out of 10.  States that she does check her blood pressure at home, states normal readings.  Denies any hypertensive symptoms.   Past Medical History:  Diagnosis Date   Gout    Seasonal allergies    Stroke [facility])    Social History   Socioeconomic History   Marital status: Widowed    Spouse name: Not on file   Number of children: Not on file   Years of education: Not on file   Highest education level: Not on file  Occupational History   Not on file  Tobacco Use   Smoking status: Never   Smokeless tobacco: Never  Vaping Use   Vaping Use: Never used  Substance and Sexual Activity   Alcohol use: No   Drug use: No   Sexual activity: Not Currently  Other Topics Concern   Not on file  Social History Narrative   Not on file   Social Determinants of Health   Financial Resource Strain: Low  Risk  (10/27/2021)   Overall Financial Resource Strain (CARDIA)    Difficulty of Paying Living Expenses: Not hard at all  Food Insecurity: No Food Insecurity (10/27/2021)   Hunger Vital Sign    Worried About Running Out of Food in the Last Year: Never true    Ran Out of Food in the Last Year: Never true  Transportation Needs: No Transportation Needs (10/27/2021)   PRAPARE - Administrator, Civil Service (Medical): No    Lack of Transportation (Non-Medical): No  Physical Activity: Sufficiently Active (10/27/2021)   Exercise Vital Sign    Days of Exercise per Week: 7 days    Minutes of Exercise per Session: 60 min  Stress: No Stress Concern Present (10/27/2021)   [employer] of Occupational Health - Occupational Stress  Questionnaire    Feeling of Stress : Not at all  Social Connections: Moderately Isolated (10/27/2021)   Social Connection and Isolation Panel [NHANES]    Frequency of Communication with Friends and Family: More than three times a week    Frequency of Social Gatherings with Friends and Family: More than three times a week    Attends Religious Services: More than 4 times per year    Active Member of [employer] or Organizations: No    Attends Banker Meetings: Never    Marital Status: Widowed  Intimate Partner Violence: Not At Risk (10/27/2021)   Humiliation, Afraid, Rape, and Kick questionnaire    Fear of Current or Ex-Partner: No    Emotionally Abused: No    Physically Abused: No    Sexually Abused: No   Family History  Problem Relation Age of Onset   Heart attack Father    Diabetes Sister    Thyroid disease Daughter    Allergies  Allergen Reactions   Codeine Itching   Hydrocodone Itching   Hydrocortisone     itching   Propoxyphene Itching and Nausea Only    itching    Review of Systems  Constitutional: Negative.   HENT: Negative.    Eyes: Negative.   Respiratory:  Negative for shortness of breath.   Cardiovascular:  Negative for chest pain.  Gastrointestinal: Negative.   Genitourinary:  Negative for dysuria.  Musculoskeletal:  Positive for back pain and myalgias.  Skin: Negative.   Neurological: Negative.   Endo/Heme/Allergies: Negative.   Psychiatric/Behavioral: Negative.        Objective:     BP (!) 156/95 (BP Location: Right Arm, Patient Position: Sitting, Cuff Size: Normal)   Pulse 93   Resp 18   Ht 5\' 3"  (1.6 m)   Wt 197 lb (89.4 kg)   SpO2 98%   BMI 34.90 kg/m     Physical Exam Vitals and nursing note reviewed.  Constitutional:      Appearance: Normal appearance.  HENT:     Head: Normocephalic and atraumatic.     Right Ear: External ear normal.     Left Ear: External ear normal.     Nose: Nose normal.     Mouth/Throat:     Mouth:  Mucous membranes are moist.     Pharynx: Oropharynx is clear.  Eyes:     Extraocular Movements: Extraocular movements intact.     Conjunctiva/sclera: Conjunctivae normal.     Pupils: Pupils are equal, round, and reactive to light.  Cardiovascular:     Rate and Rhythm: Normal rate and regular rhythm.     Pulses: Normal pulses.     Heart sounds: Normal  heart sounds.  Pulmonary:     Effort: Pulmonary effort is normal.     Breath sounds: Normal breath sounds.  Musculoskeletal:     Cervical back: Normal, normal range of motion and neck supple.     Thoracic back: Normal.     Lumbar back: Tenderness present. Decreased range of motion.     Comments: Using cane Pain elicited with range of motion testing, left hip.  Left upper back leg tender to palpation.  Skin:    General: Skin is warm and dry.  Neurological:     General: No focal deficit present.     Mental Status: She is alert and oriented to person, place, and time.  Psychiatric:        Mood and Affect: Mood normal.        Behavior: Behavior normal.        Thought Content: Thought content normal.        Judgment: Judgment normal.        Assessment & Plan:   Problem List Items Addressed This Visit   None Visit Diagnoses     Acute left-sided low back pain with left-sided sciatica    -  Primary   Relevant Medications   predniSONE (DELTASONE) 20 MG tablet   Elevated blood pressure reading in office without diagnosis of hypertension          1. Acute left-sided low back pain with left-sided sciatica Patient unable to tolerate NSAIDs due to use of blood thinner.  Trial prednisone taper.  Patient education given on supportive care.  Red flags given for prompt reevaluation. - predniSONE (DELTASONE) 20 MG tablet; Take 3 tablets (60 mg total) by mouth daily with breakfast for 2 days, THEN 2 tablets (40 mg total) daily with breakfast for 2 days, THEN 1 tablet (20 mg total) daily with breakfast for 2 days, THEN 0.5 tablets (10 mg  total) daily with breakfast for 2 days.  Dispense: 13 tablet; Refill: 0  2. Elevated blood pressure reading in office without diagnosis of hypertension Patient encouraged to continue checking blood pressure at home, keeping a written log and having available for all office visits.   I have reviewed the patient's medical history (PMH, PSH, Social History, Family History, Medications, and allergies) , and have been updated if relevant. I spent 30 minutes reviewing chart and  face to face time with patient.    Return if symptoms worsen or fail to improve.    Kasandra Knudsen Mayers, PA-C

## 2022-03-13 NOTE — Patient Instructions (Signed)
To help with your low back pain, you are going to do a steroid taper.  I also encourage gentle stretching as well as ice.  I hope that you feel better soon, please let us know if there is anything else we can do for you.  Roney Jaffe, PA-C Physician Assistant [facility] [email or web address]   Sciatica  Sciatica is pain, numbness, weakness, or tingling along the path of the sciatic nerve. The sciatic nerve starts in the lower back and runs down the back of each leg. The nerve controls the muscles in the lower leg and in the back of the knee. It also provides feeling (sensation) to the back of the thigh, the lower leg, and the sole of the foot. Sciatica is a symptom of another medical condition that pinches or puts pressure on the sciatic nerve. Sciatica most often only affects one side of the body. Sciatica usually goes away on its own or with treatment. In some cases, sciatica may come back (recur). What are the causes? This condition is caused by pressure on the sciatic nerve or pinching of the nerve. This may be the result of: A disk in between the bones of the spine bulging out too far (herniated disk). Age-related changes in the spinal disks. A pain disorder that affects a muscle in the buttock. Extra bone growth near the sciatic nerve. A break (fracture) of the pelvis. Pregnancy. Tumor. This is rare. What increases the risk? The following factors may make you more likely to develop this condition: Playing sports that place pressure or stress on the spine. Having poor strength and flexibility. A history of back injury or surgery. Sitting for long periods of time. Doing activities that involve repetitive bending or lifting. Obesity. What are the signs or symptoms? Symptoms can vary from mild to very severe, and they may include: Any of these problems in the lower back, leg, hip, or buttock: Mild tingling, numbness, or dull  aches. Burning sensations. Sharp pains. Numbness in the back of the calf or the sole of the foot. Leg weakness. Severe back pain that makes movement difficult. Symptoms may get worse when you cough, sneeze, or laugh, or when you sit or stand for long periods of time. How is this diagnosed? This condition may be diagnosed based on: Your symptoms and medical history. A physical exam. Blood tests. Imaging tests, such as: X-rays. MRI. CT scan. How is this treated? In many cases, this condition improves on its own without treatment. However, treatment may include: Reducing or modifying physical activity. Exercising and stretching. Icing and applying heat to the affected area. Medicines that help to: Relieve pain and swelling. Relax your muscles. Injections of medicines that help to relieve pain, irritation, and inflammation around the sciatic nerve (steroids). Surgery. Follow these instructions at home: Medicines Take over-the-counter and prescription medicines only as told by your health care provider. Ask your health care provider if the medicine prescribed to you: Requires you to avoid driving or using heavy machinery. Can cause constipation. You may need to take these actions to prevent or treat constipation: Drink enough fluid to keep your urine pale yellow. Take over-the-counter or prescription medicines. Eat foods that are high in fiber, such as beans, whole grains, and fresh fruits and vegetables. Limit foods that are high in fat and processed sugars, such as fried or sweet foods. Managing pain     If directed, put ice on the affected area. Put ice in a plastic bag. Place a  towel between your skin and the bag. Leave the ice on for 20 minutes, 2-3 times a day. If directed, apply heat to the affected area. Use the heat source that your health care provider recommends, such as a moist heat pack or a heating pad. Place a towel between your skin and the heat source. Leave  the heat on for 20-30 minutes. Remove the heat if your skin turns bright red. This is especially important if you are unable to feel pain, heat, or cold. You may have a greater risk of getting burned. Activity  Return to your normal activities as told by your health care provider. Ask your health care provider what activities are safe for you. Avoid activities that make your symptoms worse. Take brief periods of rest throughout the day. When you rest for longer periods, mix in some mild activity or stretching between periods of rest. This will help to prevent stiffness and pain. Avoid sitting for long periods of time without moving. Get up and move around at least one time each hour. Exercise and stretch regularly, as told by your health care provider. Do not lift anything that is heavier than 10 lb (4.5 kg) while you have symptoms of sciatica. When you do not have symptoms, you should still avoid heavy lifting, especially repetitive heavy lifting. When you lift objects, always use proper lifting technique, which includes: Bending your knees. Keeping the load close to your body. Avoiding twisting. General instructions Maintain a healthy weight. Excess weight puts extra stress on your back. Wear supportive, comfortable shoes. Avoid wearing high heels. Avoid sleeping on a mattress that is too soft or too hard. A mattress that is firm enough to support your back when you sleep may help to reduce your pain. Keep all follow-up visits as told by your health care provider. This is important. Contact a health care provider if: You have pain that: Wakes you up when you are sleeping. Gets worse when you lie down. Is worse than you have experienced in the past. Lasts longer than 4 weeks. You have an unexplained weight loss. Get help right away if: You are not able to control when you urinate or have bowel movements (incontinence). You have: Weakness in your lower back, pelvis, buttocks, or legs  that gets worse. Redness or swelling of your back. A burning sensation when you urinate. Summary Sciatica is pain, numbness, weakness, or tingling along the path of the sciatic nerve. This condition is caused by pressure on the sciatic nerve or pinching of the nerve. Sciatica can cause pain, numbness, or tingling in the lower back, legs, hips, and buttocks. Treatment often includes rest, exercise, medicines, and applying ice or heat. This information is not intended to replace advice given to you by your health care provider. Make sure you discuss any questions you have with your health care provider. Document Revised: 09/01/2018 Document Reviewed: 09/01/2018 Elsevier Patient Education  2023 [employer].

## 2022-04-14 ENCOUNTER — Other Ambulatory Visit: Payer: Self-pay | Admitting: Family Medicine

## 2022-05-11 ENCOUNTER — Ambulatory Visit: Payer: Self-pay | Admitting: *Deleted

## 2022-05-11 NOTE — Telephone Encounter (Signed)
  Chief Complaint: Muscle stiffness Symptoms: stiffness - muscle Frequency: a few days Pertinent Negatives: Patient denies fever, chest pain Disposition: [] ED /[] Urgent Care (no appt availability in office) / [] Appointment(In office/virtual)/ []  [facility]/ [] Home Care/ [x] Refused Recommended Disposition /[] [facility]/ []  Follow-up with PCP Additional Notes: PT would like Dr. to call in a prescription for a medication for muscle pain, (muscle relaxer).  Pt does not want to come into the office.  Summary: Muscle weakness / stiffness   Patient experiencing muscle weakness and body is feeling stiff for 1 week. Requesting a medication to relieve the stiffness      Reason for Disposition  [1] MILD pain (e.g., does not interfere with normal activities) AND [2] present > 7 days  Answer Assessment - Initial Assessment Questions 1. ONSET: "When did the muscle aches or body pains start?"      Few days 2. LOCATION: "What part of your body is hurting?" (e.g., entire body, arms, legs)      Left shoulder, left wrist, walks with cane on left side.  3. SEVERITY: "How bad is the pain?" (Scale 1-10; or mild, moderate, severe)   - MILD (1-3): doesn't interfere with normal activities    - MODERATE (4-7): interferes with normal activities or awakens from sleep    - SEVERE (8-10):  excruciating pain, unable to do any normal activities      Stiffness 4. CAUSE: "What do you think is causing the pains?"     Unsure 5. FEVER: "Have you been having fever?"     no 6. OTHER SYMPTOMS: "Do you have any other symptoms?" (e.g., chest pain, weakness, rash, cold or flu symptoms, weight loss)     no 7. PREGNANCY: "Is there any chance you are pregnant?" "When was your last menstrual period?"     na 8. TRAVEL: "Have you traveled out of the country in the last month?" (e.g., travel history, exposures)  Protocols used: Muscle Aches and Body Pain-A-AH

## 2022-05-11 NOTE — Telephone Encounter (Signed)
Summary: Muscle weakness / stiffness   Patient experiencing muscle weakness and body is feeling stiff for 1 week. Requesting a medication to relieve the stiffness       Called patient's daughter , on DPR, to review muscle weakness / stiffness sx. Daughter reports she is not with patient and gave [phone] to review sx with patient . No answer, LVMTCB [phone].

## 2022-05-12 NOTE — Telephone Encounter (Signed)
Patient would like appt for muscle relaxer. Please advise

## 2022-05-24 ENCOUNTER — Emergency Department (HOSPITAL_BASED_OUTPATIENT_CLINIC_OR_DEPARTMENT_OTHER)
Admission: EM | Admit: 2022-05-24 | Discharge: 2022-05-24 | Disposition: A | Discharge status: Home / Self Care | Payer: Medicare Other | Attending: Emergency Medicine | Admitting: Emergency Medicine

## 2022-05-24 ENCOUNTER — Emergency Department (HOSPITAL_BASED_OUTPATIENT_CLINIC_OR_DEPARTMENT_OTHER): Payer: Medicare Other

## 2022-05-24 ENCOUNTER — Other Ambulatory Visit: Payer: Self-pay

## 2022-05-24 DIAGNOSIS — J019 Acute sinusitis, unspecified: Secondary | ICD-10-CM | POA: Diagnosis not present

## 2022-05-24 DIAGNOSIS — Z20822 Contact with and (suspected) exposure to covid-19: Secondary | ICD-10-CM | POA: Diagnosis not present

## 2022-05-24 DIAGNOSIS — R509 Fever, unspecified: Secondary | ICD-10-CM | POA: Diagnosis present

## 2022-05-24 DIAGNOSIS — Z7901 Long term (current) use of anticoagulants: Secondary | ICD-10-CM | POA: Diagnosis not present

## 2022-05-24 LAB — URINALYSIS, ROUTINE W REFLEX MICROSCOPIC
Bilirubin Urine: NEGATIVE
Glucose, UA: NEGATIVE mg/dL
Ketones, ur: NEGATIVE mg/dL
Leukocytes,Ua: NEGATIVE
Nitrite: NEGATIVE
Protein, ur: NEGATIVE mg/dL
Specific Gravity, Urine: 1.02 (ref 1.005–1.030)
pH: 5.5 (ref 5.0–8.0)

## 2022-05-24 LAB — RESP PANEL BY RT-PCR (FLU A&B, COVID) ARPGX2
Influenza A by PCR: NEGATIVE
Influenza B by PCR: NEGATIVE
SARS Coronavirus 2 by RT PCR: NEGATIVE

## 2022-05-24 LAB — URINALYSIS, MICROSCOPIC (REFLEX): WBC, UA: NONE SEEN WBC/hpf (ref 0–5)

## 2022-05-24 MED ORDER — AMOXICILLIN-POT CLAVULANATE 875-125 MG PO TABS: 1.0000 | ORAL_TABLET | Freq: Two times a day (BID) | ORAL | 0 refills | Status: DC

## 2022-05-24 MED ORDER — AMOXICILLIN-POT CLAVULANATE 875-125 MG PO TABS
1.0000 | ORAL_TABLET | Freq: Once | ORAL | Status: AC
  Administered 2022-05-24: 1 via ORAL
  Filled 2022-05-24: qty 1

## 2022-05-24 MED ORDER — PROMETHAZINE HCL 12.5 MG PO TABS: 12.5000 mg | ORAL_TABLET | Freq: Three times a day (TID) | ORAL | 0 refills | Status: AC | PRN

## 2022-05-24 MED ORDER — ONDANSETRON 4 MG PO TBDP
4.0000 mg | ORAL_TABLET | Freq: Once | ORAL | Status: AC
  Administered 2022-05-24: 4 mg via ORAL
  Filled 2022-05-24: qty 1

## 2022-05-24 MED ORDER — ACETAMINOPHEN 325 MG PO TABS
650.0000 mg | ORAL_TABLET | Freq: Once | ORAL | Status: AC
  Administered 2022-05-24: 650 mg via ORAL
  Filled 2022-05-24: qty 2

## 2022-05-24 NOTE — ED Triage Notes (Addendum)
Pt to ED c/o sinus pain x 1 week and congestion. Pt febrile in triage, reports took Tylenol 1000mg  at home.

## 2022-05-24 NOTE — ED Provider Notes (Signed)
[facility] EMERGENCY DEPARTMENT Provider Note   CSN: [id number] Arrival date & time: 05/24/22  1606     History  Chief Complaint  Patient presents with   Facial Pain   Fever    Carrie Kline is a 76 y.o. female.  Patient here with sinus pain, congestion for a week.  Low-grade fever.  Nothing makes it worse or better.  She has had a cough.  She is having some pain with urination.  Denies any abdominal pain.  History of high cholesterol and stroke.  Nothing makes it worse or better.  No headache, no neck pain.  Pain mostly over her sinuses bilaterally.  The history is provided by the patient.       Home Medications Prior to Admission medications   Medication Sig Start Date End Date Taking? Authorizing Provider  amoxicillin-clavulanate (AUGMENTIN) 875-125 MG tablet Take 1 tablet by mouth every 12 (twelve) hours. 05/24/22  Yes Lizet Kelso, DO  promethazine (PHENERGAN) 12.5 MG tablet Take 1 tablet (12.5 mg total) by mouth every 8 (eight) hours as needed for up to 10 doses for nausea or vomiting. 05/24/22  Yes Jabron Weese, DO  albuterol (VENTOLIN HFA) 108 (90 Base) MCG/ACT inhaler INHALE 2 PUFFS INTO THE LUNGS EVERY 6 HOURS AS NEEDED FOR WHEEZING OR SHORTNESS OF BREATH 02/28/22   Georganna Skeans, MD  atorvastatin (LIPITOR) 40 MG tablet TAKE 1 TABLET(40 MG) BY MOUTH DAILY 04/16/22   Georganna Skeans, MD  clopidogrel (PLAVIX) 75 MG tablet Take 1 tablet (75 mg total) by mouth daily. 08/01/21   Ihor Austin, NP  fluticasone (FLONASE) 50 MCG/ACT nasal spray Administer 1 spray in each nostril daily as needed. 08/28/21   [provider]  hydrOXYzine (ATARAX) 25 MG tablet Take 25 mg by mouth 4 (four) times daily. Patient not taking: Reported on 03/13/2022 12/14/21   [provider]  lisinopril (ZESTRIL) 5 MG tablet Take 5 mg by mouth daily. 01/16/22   [provider]  pantoprazole (PROTONIX) 40 MG tablet Take 40 mg by mouth daily. 01/16/22   [provider]      Allergies    Codeine, Hydrocodone, Hydrocortisone, and Propoxyphene    Review of Systems   Review of Systems  Physical Exam Updated Vital Signs BP (!) 162/79 (BP Location: Left Arm)   Pulse (!) 110   Temp 99 F (37.2 C) (Oral)   Resp 17   Ht 5\' 3"  (1.6 m)   Wt 87.5 kg   SpO2 98%   BMI 34.19 kg/m  Physical Exam Vitals and nursing note reviewed.  Constitutional:      General: She is not in acute distress.    Appearance: She is well-developed. She is not ill-appearing.  HENT:     Head: Normocephalic and atraumatic.     Nose: Congestion present.     Mouth/Throat:     Mouth: Mucous membranes are moist.  Eyes:     Extraocular Movements: Extraocular movements intact.     Conjunctiva/sclera: Conjunctivae normal.     Pupils: Pupils are equal, round, and reactive to light.  Cardiovascular:     Rate and Rhythm: Normal rate and regular rhythm.     Pulses: Normal pulses.     Heart sounds: Normal heart sounds. No murmur heard. Pulmonary:     Effort: Pulmonary effort is normal. No respiratory distress.     Breath sounds: Normal breath sounds.  Abdominal:     Palpations: Abdomen is soft.     Tenderness: There  is no abdominal tenderness.  Musculoskeletal:        General: No swelling.     Cervical back: Normal range of motion and neck supple.  Skin:    General: Skin is warm and dry.     Capillary Refill: Capillary refill takes less than 2 seconds.  Neurological:     General: No focal deficit present.     Mental Status: She is alert and oriented to person, place, and time.     Cranial Nerves: No cranial nerve deficit.     Sensory: No sensory deficit.     Motor: No weakness.     Coordination: Coordination normal.  Psychiatric:        Mood and Affect: Mood normal.     ED Results / Procedures / Treatments   Labs (all labs ordered are listed, but only abnormal results are displayed) Labs Reviewed  URINALYSIS, ROUTINE W REFLEX MICROSCOPIC - Abnormal; Notable for the  following components:      Result Value   Hgb urine dipstick TRACE (*)    All other components within normal limits  URINALYSIS, MICROSCOPIC (REFLEX) - Abnormal; Notable for the following components:   Bacteria, UA RARE (*)    All other components within normal limits  RESP PANEL BY RT-PCR (FLU A&B, COVID) [id number]    EKG None  Radiology DG Chest Portable 1 View  Result Date: 05/24/2022 CLINICAL DATA:  Cough EXAM: PORTABLE CHEST 1 VIEW COMPARISON:  None Available. FINDINGS: The heart size and mediastinal contours are within normal limits. Both lungs are clear. The visualized skeletal structures are unremarkable. IMPRESSION: No active disease. Electronically Signed   By: Helyn Numbers M.D.   On: 05/24/2022 19:11    Procedures Procedures    Medications Ordered in ED Medications  amoxicillin-clavulanate (AUGMENTIN) 875-125 MG per tablet 1 tablet (has no administration in time range)  ondansetron (ZOFRAN-ODT) disintegrating tablet 4 mg (has no administration in time range)  acetaminophen (TYLENOL) tablet 650 mg (650 mg Oral Given 05/24/22 1903)    ED Course/ Medical Decision Making/ A&P                           Medical Decision Making Amount and/or Complexity of Data Reviewed Labs: ordered. Radiology: ordered.  Risk OTC drugs. Prescription drug management.   [name] is here with sinus congestion.  Normal vitals except for low-grade fever.  Very well-appearing.  Pain mostly over her sinuses.  No obvious dental infection on exam.  She is having some pain with urination.  No abdominal pain.  No headache or fever or chills otherwise.  COVID test is negative.  Urinalysis negative for infection.  Chest x-ray per my review and interpretation with no evidence of pneumonia.  Overall suspect viral process versus sinusitis.  Will treat with Augmentin.  I have no concern for sepsis or other source for infection as her symptoms appear to be viral.  She is well-appearing.  Discharged in  good condition.  She understands return precautions.  This chart was dictated using voice recognition software.  Despite best efforts to proofread,  errors can occur which can change the documentation meaning.         Final Clinical Impression(s) / ED Diagnoses Final diagnoses:  Acute sinusitis, recurrence not specified, unspecified location    Rx / DC Orders ED Discharge Orders          Ordered    amoxicillin-clavulanate (AUGMENTIN) 875-125 MG tablet  Every  12 hours        05/24/22 1945    promethazine (PHENERGAN) 12.5 MG tablet  Every 8 hours PRN        05/24/22 1945              Lennice Sites, DO 05/24/22 1946

## 2022-06-28 ENCOUNTER — Encounter: Payer: Self-pay | Admitting: Physical Medicine & Rehabilitation

## 2022-06-28 ENCOUNTER — Encounter: Payer: Medicare Other | Attending: Physical Medicine & Rehabilitation | Admitting: Physical Medicine & Rehabilitation

## 2022-06-28 VITALS — BP 161/93 | HR 93 | Ht 63.0 in | Wt 199.2 lb

## 2022-06-28 DIAGNOSIS — I69351 Hemiplegia and hemiparesis following cerebral infarction affecting right dominant side: Secondary | ICD-10-CM | POA: Insufficient documentation

## 2022-06-28 NOTE — Patient Instructions (Signed)
Please call if you are having brace issues , movement or pain issues   Please make appt with Neurology

## 2022-06-28 NOTE — Progress Notes (Signed)
Subjective:    Patient ID: Carrie Kline, female    DOB: 1945/10/22, 76 y.o.   MRN: [id number] 76 y.o. right-handed female with history of gout as well as back surgery.  Per chart review lives with her children.  Independent prior to admission.  Presented 02/24/2021 with acute onset of right side weakness mild slurred speech.  CT/MRI showed acute infarct left pons.  Slight edema without mass-effect.  Patient did not receive tPA.  CT angiogram head and neck negative for large vessel occlusion.  Echocardiogram with ejection fraction of 55 to 60% no wall motion abnormalities.  Admission chemistries unremarkable except glucose 240 hemoglobin A1c 7.4.  Maintained on aspirin as well as Plavix for CVA prophylaxis x3 weeks total then aspirin alone.  Subcutaneous Lovenox for DVT prophylaxis.  Incidental findings thyroid nodule ultrasound of thyroid showing parenchymal echotexture recommend outpatient follow-up.  Patient tolerating a regular diet.  Glucophage initiated for new findings of diabetes mellitus.  Due to patient's right side weakness therapy evaluations completed was admitted for a comprehensive rehab program.  Admit date: 03/01/2021 Discharge date: 03/24/2021 HPI  Having sinus issues   Left leg pain seen by PCP  Bilateral knee   Has new RIght AFO , walks without cane in home, but uses cane outside the home   Mod I step  Mod I dressing , needs help with tub transfers   Pain Inventory Average Pain 0 Pain Right Now 0 My pain is  no pain  LOCATION OF PAIN  no pain  BOWEL Number of stools per week: 5   BLADDER Pads    Mobility use a cane  Function disabled: date disabled 02/24/21  Neuro/Psych No problems in this area  Prior Studies Any changes since last visit?  no  Physicians involved in your care Any changes since last visit?  no   Family History  Problem Relation Age of Onset   Heart attack Father    Diabetes Sister    Thyroid disease Daughter    Social History    Socioeconomic History   Marital status: Widowed    Spouse name: Not on file   Number of children: Not on file   Years of education: Not on file   Highest education level: Not on file  Occupational History   Not on file  Tobacco Use   Smoking status: Never   Smokeless tobacco: Never  Vaping Use   Vaping Use: Never used  Substance and Sexual Activity   Alcohol use: No   Drug use: No   Sexual activity: Not Currently  Other Topics Concern   Not on file  Social History Narrative   Not on file   Social Determinants of Health   Financial Resource Strain: Low Risk  (10/27/2021)   Overall Financial Resource Strain (CARDIA)    Difficulty of Paying Living Expenses: Not hard at all  Food Insecurity: No Food Insecurity (10/27/2021)   Hunger Vital Sign    Worried About Running Out of Food in the Last Year: Never true    [place] in the Last Year: Never true  Transportation Needs: No Transportation Needs (10/27/2021)   PRAPARE - Hydrologist (Medical): No    Lack of Transportation (Non-Medical): No  Physical Activity: Sufficiently Active (10/27/2021)   Exercise Vital Sign    Days of Exercise per Week: 7 days    Minutes of Exercise per Session: 60 min  Stress: No Stress Concern Present (10/27/2021)  [employer] - Occupational Stress Questionnaire    Feeling of Stress : Not at all  Social Connections: Moderately Isolated (10/27/2021)   Social Connection and Isolation Panel [NHANES]    Frequency of Communication with Friends and Family: More than three times a week    Frequency of Social Gatherings with Friends and Family: More than three times a week    Attends Religious Services: More than 4 times per year    Active Member of [employer] or Organizations: No    Attends Archivist Meetings: Never    Marital Status: Widowed   Past Surgical History:  Procedure Laterality Date   ABDOMINAL HYSTERECTOMY     BACK SURGERY      GANGLION CYST EXCISION     Past Medical History:  Diagnosis Date   Gout    Seasonal allergies    Stroke ([place])    BP (!) 161/93   Pulse 93   Ht 5\' 3"  (1.6 m)   Wt 199 lb 3.2 oz (90.4 kg)   SpO2 97%   BMI 35.29 kg/m   Opioid Risk Score:   Fall Risk Score:  `1  Depression screen [facility] 2/9     03/13/2022    9:11 AM 01/02/2022   10:11 AM 12/26/2021    2:15 PM 10/27/2021    1:11 PM 09/28/2021    3:39 PM 07/11/2021    3:24 PM 07/05/2021   10:06 AM  Depression screen PHQ 2/9  Decreased Interest 0 0 0 0 0 0 0  Down, Depressed, Hopeless 0 0 0 0 0 0 0  PHQ - 2 Score 0 0 0 0 0 0 0  Altered sleeping 0 0     0  Tired, decreased energy 2 0     0  Change in appetite 0 0     0  Feeling bad or failure about yourself  0 0     0  Trouble concentrating 0 0     0  Moving slowly or fidgety/restless 0 0     0  Suicidal thoughts 0 0     0  PHQ-9 Score 2 0     0  Difficult doing work/chores Not difficult at all Not difficult at all           Review of Systems  All other systems reviewed and are negative.     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  She uses a cane outside the home and is working on walking without the cane in the home.  Her long-term goal is not to use the cane to ambulate at all. Patient is reaching a functional  plateau she is independent with her ADLs and mobility.  No formal therapy needed at this time.  I will see  the patient back on a as needed basis she will still follow-up with neurology as well as primary care.

## 2022-07-05 ENCOUNTER — Encounter: Payer: Self-pay | Admitting: Family Medicine

## 2022-07-05 ENCOUNTER — Ambulatory Visit (INDEPENDENT_AMBULATORY_CARE_PROVIDER_SITE_OTHER): Payer: Medicare Other | Admitting: Family Medicine

## 2022-07-05 VITALS — BP 129/83 | HR 85 | Temp 98.1°F | Resp 16 | Wt 199.0 lb

## 2022-07-05 DIAGNOSIS — J0100 Acute maxillary sinusitis, unspecified: Secondary | ICD-10-CM

## 2022-07-05 MED ORDER — FLUTICASONE PROPIONATE 50 MCG/ACT NA SUSP: NASAL | 1 refills | Status: DC

## 2022-07-05 MED ORDER — AMOXICILLIN 875 MG PO TABS
875.0000 mg | ORAL_TABLET | Freq: Two times a day (BID) | ORAL | 0 refills | Status: AC
Start: 2022-07-05 — End: 2022-07-15

## 2022-07-05 NOTE — Progress Notes (Signed)
Patient is here for their 6 month follow-up Patient has nasal congestion with green mucus. Care gaps have been discussed with patient

## 2022-07-09 ENCOUNTER — Encounter: Payer: Self-pay | Admitting: Family Medicine

## 2022-07-09 NOTE — Progress Notes (Signed)
Established Patient Office Visit  Subjective    Patient ID: Carrie Kline, female    DOB: 07/18/1946  Age: 76 y.o. MRN: [id number]  CC: No chief complaint on file.   HPI Carrie Kline presents with complaint of sinus pain and pressure with purulent nasal drainage. Patient denies fever/chills or viral sx.    Outpatient Encounter Medications as of 07/05/2022  Medication Sig   amoxicillin (AMOXIL) 875 MG tablet Take 1 tablet (875 mg total) by mouth 2 (two) times daily for 10 days.   albuterol (VENTOLIN HFA) 108 (90 Base) MCG/ACT inhaler INHALE 2 PUFFS INTO THE LUNGS EVERY 6 HOURS AS NEEDED FOR WHEEZING OR SHORTNESS OF BREATH   amoxicillin-clavulanate (AUGMENTIN) 875-125 MG tablet Take 1 tablet by mouth every 12 (twelve) hours. (Patient not taking: Reported on 06/28/2022)   atorvastatin (LIPITOR) 40 MG tablet TAKE 1 TABLET(40 MG) BY MOUTH DAILY   clopidogrel (PLAVIX) 75 MG tablet Take 1 tablet (75 mg total) by mouth daily.   fluticasone (FLONASE) 50 MCG/ACT nasal spray Administer 1 spray in each nostril BID as needed.   hydrOXYzine (ATARAX) 25 MG tablet Take 25 mg by mouth 4 (four) times daily. (Patient not taking: Reported on 03/13/2022)   lisinopril (ZESTRIL) 5 MG tablet Take 5 mg by mouth daily. (Patient not taking: Reported on 06/28/2022)   pantoprazole (PROTONIX) 40 MG tablet Take 40 mg by mouth daily.   promethazine (PHENERGAN) 12.5 MG tablet Take 1 tablet (12.5 mg total) by mouth every 8 (eight) hours as needed for up to 10 doses for nausea or vomiting.   [DISCONTINUED] fluticasone (FLONASE) 50 MCG/ACT nasal spray Administer 1 spray in each nostril daily as needed.   No facility-administered encounter medications on file as of 07/05/2022.    Past Medical History:  Diagnosis Date   Gout    Seasonal allergies    Stroke [facility])     Past Surgical History:  Procedure Laterality Date   ABDOMINAL HYSTERECTOMY     BACK SURGERY     GANGLION CYST EXCISION      Family History  Problem  Relation Age of Onset   Heart attack Father    Diabetes Sister    Thyroid disease Daughter     Social History   Socioeconomic History   Marital status: Widowed    Spouse name: Not on file   Number of children: Not on file   Years of education: Not on file   Highest education level: Not on file  Occupational History   Not on file  Tobacco Use   Smoking status: Never   Smokeless tobacco: Never  Vaping Use   Vaping Use: Never used  Substance and Sexual Activity   Alcohol use: No   Drug use: No   Sexual activity: Not Currently  Other Topics Concern   Not on file  Social History Narrative   Not on file   Social Determinants of Health   Financial Resource Strain: Low Risk  (10/27/2021)   Overall Financial Resource Strain (CARDIA)    Difficulty of Paying Living Expenses: Not hard at all  Food Insecurity: No Food Insecurity (10/27/2021)   Hunger Vital Sign    Worried About Running Out of Food in the Last Year: Never true    Ran Out of Food in the Last Year: Never true  Transportation Needs: No Transportation Needs (10/27/2021)   PRAPARE - Administrator, Civil Service (Medical): No    Lack of Transportation (Non-Medical): No  Physical Activity:  Sufficiently Active (10/27/2021)   Exercise Vital Sign    Days of Exercise per Week: 7 days    Minutes of Exercise per Session: 60 min  Stress: No Stress Concern Present (10/27/2021)   [place]    Feeling of Stress : Not at all  Social Connections: Moderately Isolated (10/27/2021)   Social Connection and Isolation Panel [NHANES]    Frequency of Communication with Friends and Family: More than three times a week    Frequency of Social Gatherings with Friends and Family: More than three times a week    Attends Religious Services: More than 4 times per year    Active Member of [employer] or Organizations: No    Attends Archivist Meetings: Never    Marital  Status: Widowed  Intimate Partner Violence: Not At Risk (10/27/2021)   Humiliation, Afraid, Rape, and Kick questionnaire    Fear of Current or Ex-Partner: No    Emotionally Abused: No    Physically Abused: No    Sexually Abused: No    Review of Systems  All other systems reviewed and are negative.       Objective    BP 129/83   Pulse 85   Temp 98.1 F (36.7 C) (Oral)   Resp 16   Wt 199 lb (90.3 kg)   SpO2 98%   BMI 35.25 kg/m   Physical Exam Vitals and nursing note reviewed.  Constitutional:      General: She is not in acute distress. HENT:     Right Ear: Tympanic membrane normal.     Left Ear: Tympanic membrane normal.     Nose:     Right Sinus: Maxillary sinus tenderness present.     Left Sinus: Maxillary sinus tenderness present.  Cardiovascular:     Rate and Rhythm: Normal rate and regular rhythm.  Pulmonary:     Effort: Pulmonary effort is normal.     Breath sounds: Normal breath sounds.  Neurological:     General: No focal deficit present.     Mental Status: She is alert and oriented to person, place, and time.         Assessment & Plan:   1. Acute maxillary sinusitis, recurrence not specified Amox and flonase prescribed  Return in about 3 months (around 10/05/2022) for physical.   Becky Sax, MD

## 2022-07-16 ENCOUNTER — Telehealth: Payer: Self-pay | Admitting: Adult Health

## 2022-07-16 MED ORDER — CLOPIDOGREL BISULFATE 75 MG PO TABS: 75.0000 mg | ORAL_TABLET | Freq: Every day | ORAL | 1 refills | Status: DC

## 2022-07-16 NOTE — Telephone Encounter (Signed)
Rx refilled.

## 2022-07-16 NOTE — Telephone Encounter (Signed)
Pt is calling. Requesting a refill on medication clopidogrel (PLAVIX) 75 MG tablet. Refill should be sent to  [facility] [phone]

## 2022-07-30 NOTE — Progress Notes (Unsigned)
[facility] [address] [place]. [place] [address] [phone]       STROKE FOLLOW UP NOTE  [name] Date of Birth:  [birth year] Medical Record Number:  [id number]   Reason for Referral:  Stroke follow up    SUBJECTIVE:   CHIEF COMPLAINT:  No chief complaint on file.   HPI:   Update 07/31/2022 JM: Patient returns for stroke follow-up after prior visit 1 year ago.  Overall stable without any new stroke/TIA symptoms.  Reports continued right-sided weakness, stable.  Ambulates with ***, denies any recent falls.   Compliant on Plavix and atorvastatin Blood pressure *** Routinely follows with PCP     History provided for reference purposes only Update 08/01/2021 JM: Returns for 52-month stroke follow-up accompanied by her daughter, Carrie Kline.  Presented to ER on 06/19/2021 with left-sided facial weakness upon awakening and resolved within an hour. Per review of ER note, they were unable to appreciate any significant facial weakness.  No other new stroke/TIA symptoms.  Concern of possible TIA vs small stroke - CTA head/neck completed which did not show any new stroke and stable appearance of intracranial stenosis compared to prior imaging. She has not had any reoccurring or new stroke/TIA symptoms since that time  Reports continued right-sided weakness but overall greatly improving Continues to work with PT/OT.  Ambulates with cane - no recent falls  Compliant on aspirin and atorvastatin -denies side effects Blood pressure today elevated at 174/97 and on recheck 160/96 - does not routinely monitor at home She is recovering from the flu and has had mild generalized headache since that time. She questions if she has now developed a sinus infection. She has been increasing intake of canned soup since being sick.   No further concerns at this time  Initial visit 05/08/2021 JM: Carrie Kline is being seen for hospital follow-up accompanied by her daughter,  Carrie Kline. Residual right sided weakness with improvement working with therapies.  She does have occasional shoulder stiffness and spasm sensation but denies associated pain.  Currently using RW and AFO brace without any recent falls.  Therapy has been working with her on transitioning to cane.  Denies any residual speech difficulty.  Her daughter has been staying with her to assist as needed.  Denies new stroke/TIA symptoms. On aspirin and atorvastatin. Blood pressure today 138/80- does not monitor at home.  No further concerns at this time.  Stroke admission 02/24/2021 [name] is a 76 y.o. female with history of chronic headache admitted on 02/24/2021 for right-sided weakness, slurred speech and right facial droop.  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  EF 55 to 60%.  LDL 121 - started on atorvastatin 40mg  daily.  A1c 7.4 (new dx of DM) -metformin initiated.  Recommended DAPT for 3 weeks and aspirin alone.  No prior stroke history.  Therapy evaluation recommended CIR with residual right hemiparesis, dysarthria and gait impairment.    PERTINENT IMAGING/LABs  CTA head/neck 06/20/2019 0 IMPRESSION: No acute head CT finding. Subtle evidence of the left pontine stroke which was acute in July of this year. Otherwise negative. No intracranial large or medium vessel occlusion. Minimal atherosclerotic change at the left carotid bifurcation but no stenosis. 50% stenosis of the left vertebral artery origin. Atherosclerotic disease and tortuosity of the left vertebral artery V4 segment, but without stenosis greater than 30%. More distal PCA branches show atherosclerotic irregularity.   Per  hospitalization 02/24/2021 CT no acute abnormality. CT head and neck left VA origin in the bilateral distal PCA branches stenosis MRI left pontine infarct 2D Echo EF 55 to 60% LDL 121 HgbA1c 7.4    ROS:   14 system review  of systems performed and negative with exception of those listed in HPI  PMH:  Past Medical History:  Diagnosis Date   Gout    Seasonal allergies    Stroke (HCC)     PSH:  Past Surgical History:  Procedure Laterality Date   ABDOMINAL HYSTERECTOMY     BACK SURGERY     GANGLION CYST EXCISION      Social History:  Social History   Socioeconomic History   Marital status: Widowed    Spouse name: Not on file   Number of children: Not on file   Years of education: Not on file   Highest education level: Not on file  Occupational History   Not on file  Tobacco Use   Smoking status: Never   Smokeless tobacco: Never  Vaping Use   Vaping Use: Never used  Substance and Sexual Activity   Alcohol use: No   Drug use: No   Sexual activity: Not Currently  Other Topics Concern   Not on file  Social History Narrative   Not on file   Social Determinants of Health   Financial Resource Strain: Low Risk  (10/27/2021)   Overall Financial Resource Strain (CARDIA)    Difficulty of Paying Living Expenses: Not hard at all  Food Insecurity: No Food Insecurity (10/27/2021)   Hunger Vital Sign    Worried About Running Out of Food in the Last Year: Never true    Ran Out of Food in the Last Year: Never true  Transportation Needs: No Transportation Needs (10/27/2021)   PRAPARE - Administrator, Civil ServiceTransportation    Lack of Transportation (Medical): No    Lack of Transportation (Non-Medical): No  Physical Activity: Sufficiently Active (10/27/2021)   Exercise Vital Sign    Days of Exercise per Week: 7 days    Minutes of Exercise per Session: 60 min  Stress: No Stress Concern Present (10/27/2021)   [employer] - Occupational Stress Questionnaire    Feeling of Stress : Not at all  Social Connections: Moderately Isolated (10/27/2021)   Social Connection and Isolation Panel [NHANES]    Frequency of Communication with Friends and Family: More than three times a week    Frequency of Social  Gatherings with Friends and Family: More than three times a week    Attends Religious Services: More than 4 times per year    Active Member of [employer] or Organizations: No    Attends [employer] or Organization Meetings: Never    Marital Status: Widowed  Intimate Partner Violence: Not At Risk (10/27/2021)   Humiliation, Afraid, Rape, and Kick questionnaire    Fear of Current or Ex-Partner: No    Emotionally Abused: No    Physically Abused: No    Sexually Abused: No    Family History:  Family History  Problem Relation Age of Onset   Heart attack Father    Diabetes Sister    Thyroid disease Daughter     Medications:   Current Outpatient Medications on File Prior to Visit  Medication Sig Dispense Refill   albuterol (VENTOLIN HFA) 108 (90 Base) MCG/ACT inhaler INHALE 2 PUFFS INTO THE LUNGS EVERY 6 HOURS AS NEEDED FOR WHEEZING OR SHORTNESS OF BREATH 6.7 g 1   amoxicillin-clavulanate (  AUGMENTIN) 875-125 MG tablet Take 1 tablet by mouth every 12 (twelve) hours. (Patient not taking: Reported on 06/28/2022) 14 tablet 0   atorvastatin (LIPITOR) 40 MG tablet TAKE 1 TABLET(40 MG) BY MOUTH DAILY 90 tablet 1   clopidogrel (PLAVIX) 75 MG tablet Take 1 tablet (75 mg total) by mouth daily. 30 tablet 1   fluticasone (FLONASE) 50 MCG/ACT nasal spray Administer 1 spray in each nostril BID as needed. 18.2 g 1   hydrOXYzine (ATARAX) 25 MG tablet Take 25 mg by mouth 4 (four) times daily. (Patient not taking: Reported on 03/13/2022)     lisinopril (ZESTRIL) 5 MG tablet Take 5 mg by mouth daily. (Patient not taking: Reported on 06/28/2022)     pantoprazole (PROTONIX) 40 MG tablet Take 40 mg by mouth daily.     promethazine (PHENERGAN) 12.5 MG tablet Take 1 tablet (12.5 mg total) by mouth every 8 (eight) hours as needed for up to 10 doses for nausea or vomiting. 10 tablet 0   No current facility-administered medications on file prior to visit.    Allergies:   Allergies  Allergen Reactions   Codeine Itching    Hydrocodone Itching   Hydrocortisone     itching   Propoxyphene Itching and Nausea Only    itching      OBJECTIVE:  Physical Exam  There were no vitals filed for this visit.  There is no height or weight on file to calculate BMI. No results found.  General: well developed, well nourished, very pleasant elderly African-American female, seated, in no evident distress Head: head normocephalic and atraumatic.   Neck: supple with no carotid or supraclavicular bruits Cardiovascular: regular rate and rhythm, no murmurs Musculoskeletal: no deformity Skin:  no rash/petichiae Vascular:  Normal pulses all extremities   Neurologic Exam Mental Status: Awake and fully alert.  Fluent speech and language.  Oriented to place and time. Recent and remote memory intact. Attention span, concentration and fund of knowledge appropriate. Mood and affect appropriate.  Cranial Nerves: Pupils equal, briskly reactive to light. Extraocular movements full without nystagmus. Visual fields full to confrontation. Hearing intact. Facial sensation intact.  Very slight right lower facial weakness.  Tongue, palate moves normally and symmetrically.  Motor: Normal strength, bulk and tone left upper and lower extremity.  RUE: 4+/5 deltoid, 4/5 bicep and tricep, 3+/5 grip strength; limited shoulder ROM RLE: 4/5 hip flexor, knee extension and flexion, 3/5 ADF with AFO brace in place Sensory.: intact to touch , pinprick , position and vibratory sensation.  Coordination: Rapid alternating movements normal on left side. Finger-to-nose and heel-to-shin performed accurately on left side. Gait and Station: Arises from chair without difficulty. Stance is normal. Gait demonstrates hemiplegic gait with decreased RLE step height and use of cane. Tandem walk and heel-toe not attempted  Reflexes: 1+ and symmetric. Toes downgoing.         ASSESSMENT: Akshaya Toepfer is a 76 y.o. 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 Possible TIA on 06/19/2021 after presenting with left facial weakness lasting approximately 1 hour with imaging unremarkable.  Vascular risk factors include HLD, advanced age, obesity, migraines, DM.      PLAN:  Left pontine stroke: TIA:  Residual deficit: Right hemiparesis and gait impairment with continued improvement.  Encouraged continued participation with therapies for hopeful ongoing recovery Continue Plavix 75 mg daily and atorvastatin 40 mg daily for secondary stroke prevention.  Discussed secondary stroke prevention measures and importance of close PCP follow up for aggressive stroke risk factor management including BP goal<130/90, HLD with LDL goal<70 and DM with A1c.<7 . I have gone over the pathophysiology of stroke, warning signs and symptoms, risk factors and their management in some detail with instructions to go to the closest emergency room for symptoms of concern.     Follow up in 6 months or call earlier if needed   CC:  PCP: Georganna Skeans, MD    I spent 38 minutes of face-to-face and non-face-to-face time with patient and daughter.  This included previsit chart review including review of recent ER admission, lab review, study review, electronic health record documentation, patient and daughter education and discussion regarding prior stroke including etiology, recent possible TIA, secondary stroke prevention measures and importance of managing stroke risk factors, residual deficits and possible further recovery and answered all other questions to patient and daughters satisfaction  Ihor Austin, [facility]  [facility] [address] [place], [place] [address]  Phone [phone] Fax [phone] Note: This document was prepared with digital dictation and possible smart phrase technology. Any transcriptional errors that result  from this process are unintentional.

## 2022-07-31 ENCOUNTER — Ambulatory Visit (INDEPENDENT_AMBULATORY_CARE_PROVIDER_SITE_OTHER): Payer: Medicare Other | Admitting: Adult Health

## 2022-07-31 ENCOUNTER — Encounter: Payer: Self-pay | Admitting: Adult Health

## 2022-07-31 VITALS — BP 149/89 | HR 100 | Ht 63.0 in | Wt 201.6 lb

## 2022-07-31 DIAGNOSIS — I639 Cerebral infarction, unspecified: Secondary | ICD-10-CM | POA: Diagnosis not present

## 2022-07-31 DIAGNOSIS — G459 Transient cerebral ischemic attack, unspecified: Secondary | ICD-10-CM

## 2022-07-31 NOTE — Patient Instructions (Addendum)
Continue clopidogrel 75 mg daily  and atorvastatin  for secondary stroke prevention  Continue to follow up with PCP regarding cholesterol and blood pressure management  Maintain strict control of hypertension with blood pressure goal below 130/90, diabetes with hemoglobin A1c goal below 7.0 % and cholesterol with LDL cholesterol (bad cholesterol) goal below 70 mg/dL.   Signs of a Stroke? Follow the BEFAST method:  Balance Watch for a sudden loss of balance, trouble with coordination or vertigo Eyes Is there a sudden loss of vision in one or both eyes? Or double vision?  Face: Ask the person to smile. Does one side of the face droop or is it numb?  Arms: Ask the person to raise both arms. Does one arm drift downward? Is there weakness or numbness of a leg? Speech: Ask the person to repeat a simple phrase. Does the speech sound slurred/strange? Is the person confused ? Time: If you observe any of these signs, call [phone].     As you have been stable from a stroke standpoint, you can follow up with [place] as needed at this time. Please call with any questions or concerns regarding your stroke if needed     Thank you for coming to see [place] at [facility]. I hope we have been able to provide you high quality care today.  You may receive a patient satisfaction survey over the next few weeks. We would appreciate your feedback and comments so that we may continue to improve ourselves and the health of our patients.

## 2022-09-12 LAB — HEMOGLOBIN A1C: Hemoglobin A1C: 6.8

## 2022-09-18 ENCOUNTER — Other Ambulatory Visit: Payer: Self-pay | Admitting: Neurology

## 2022-09-18 ENCOUNTER — Telehealth: Payer: Self-pay | Admitting: Adult Health

## 2022-09-18 MED ORDER — CLOPIDOGREL BISULFATE 75 MG PO TABS: 75.0000 mg | ORAL_TABLET | Freq: Every day | ORAL | 1 refills | Status: DC

## 2022-09-18 NOTE — Telephone Encounter (Signed)
Pt is calling requesting a refill on clopidogrel (PLAVIX) 75 MG tablet. Pt is requesting a three month supply. Refill should be sent to [facility] [phone]

## 2022-09-18 NOTE — Telephone Encounter (Signed)
Refill sent to the pharmacy as a 90 day supply for the pt.

## 2022-10-08 ENCOUNTER — Encounter: Payer: Medicare Other | Admitting: Family Medicine

## 2022-10-16 ENCOUNTER — Other Ambulatory Visit: Payer: Self-pay | Admitting: Family Medicine

## 2022-10-23 IMAGING — DX DG HIP (WITH OR WITHOUT PELVIS) 2-3V*L*
3 series · 3 of 3 positions shown · non-contrast
Comparison: None.

CLINICAL DATA: Hip pain after fall

EXAM:
DG HIP (WITH OR WITHOUT PELVIS) 2-3V LEFT

[pelvis ap]
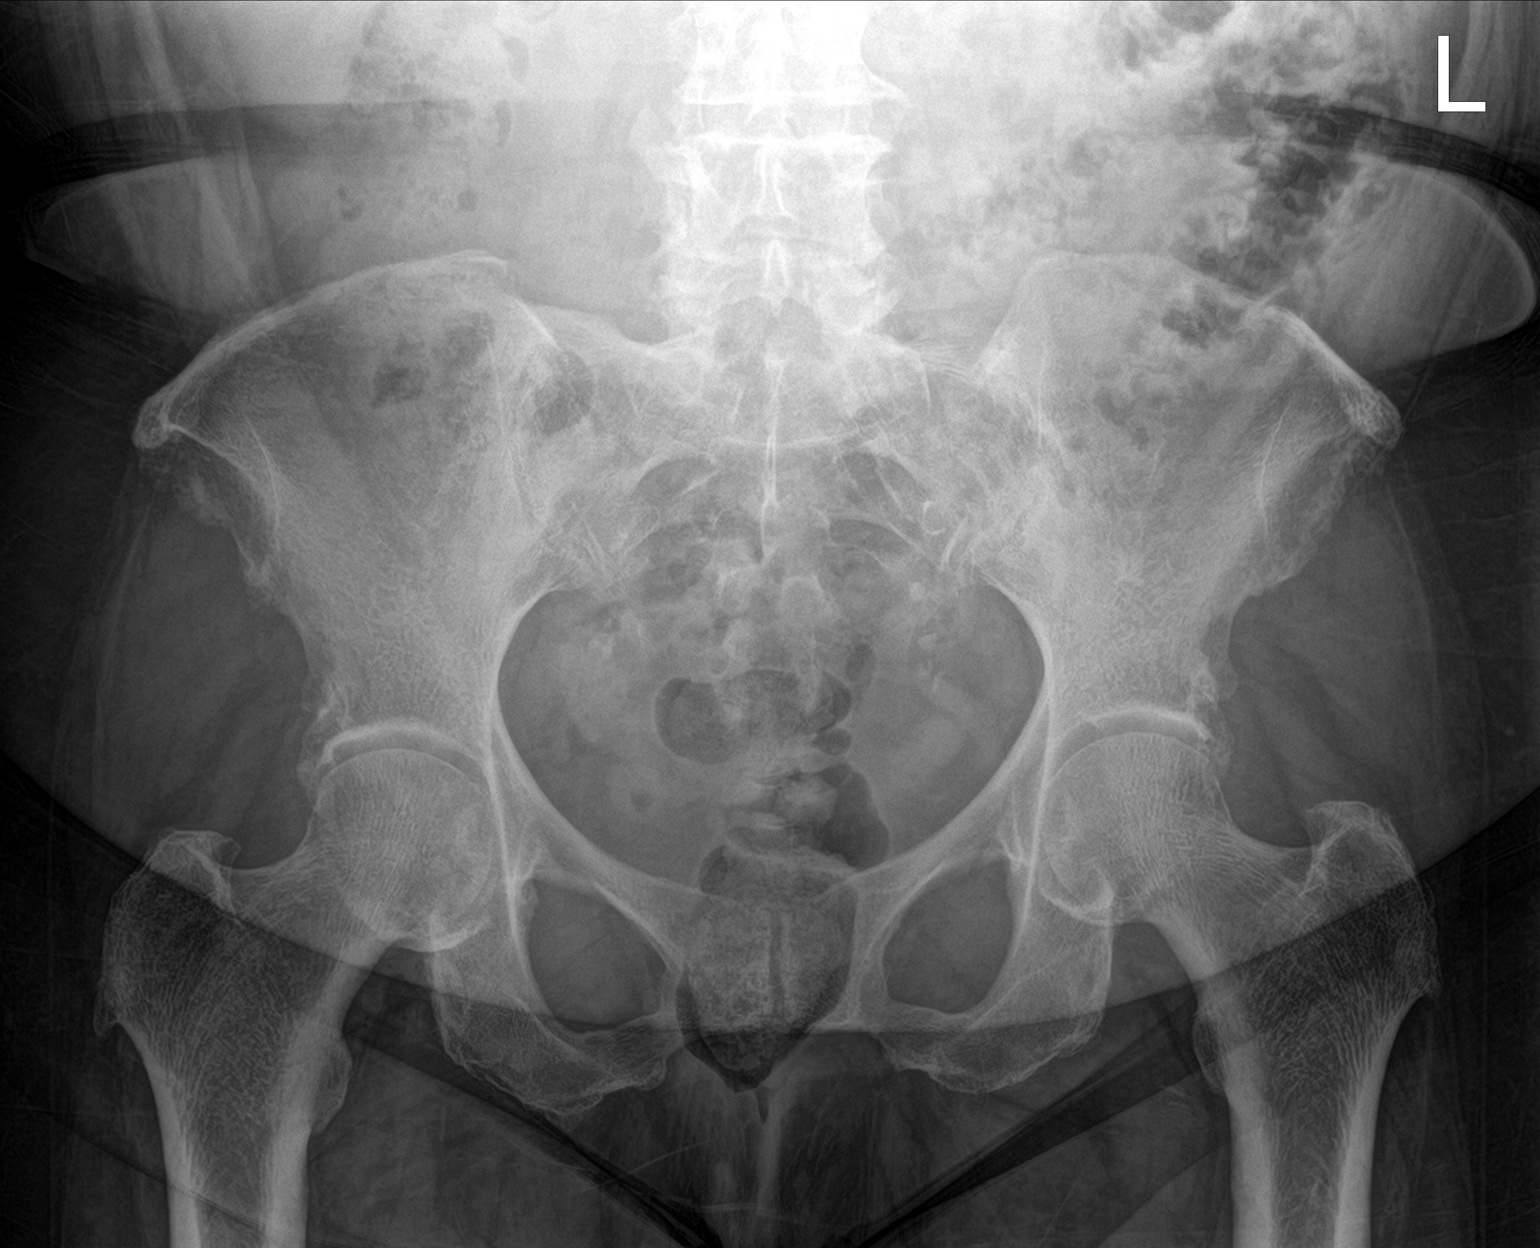

[hip ap]
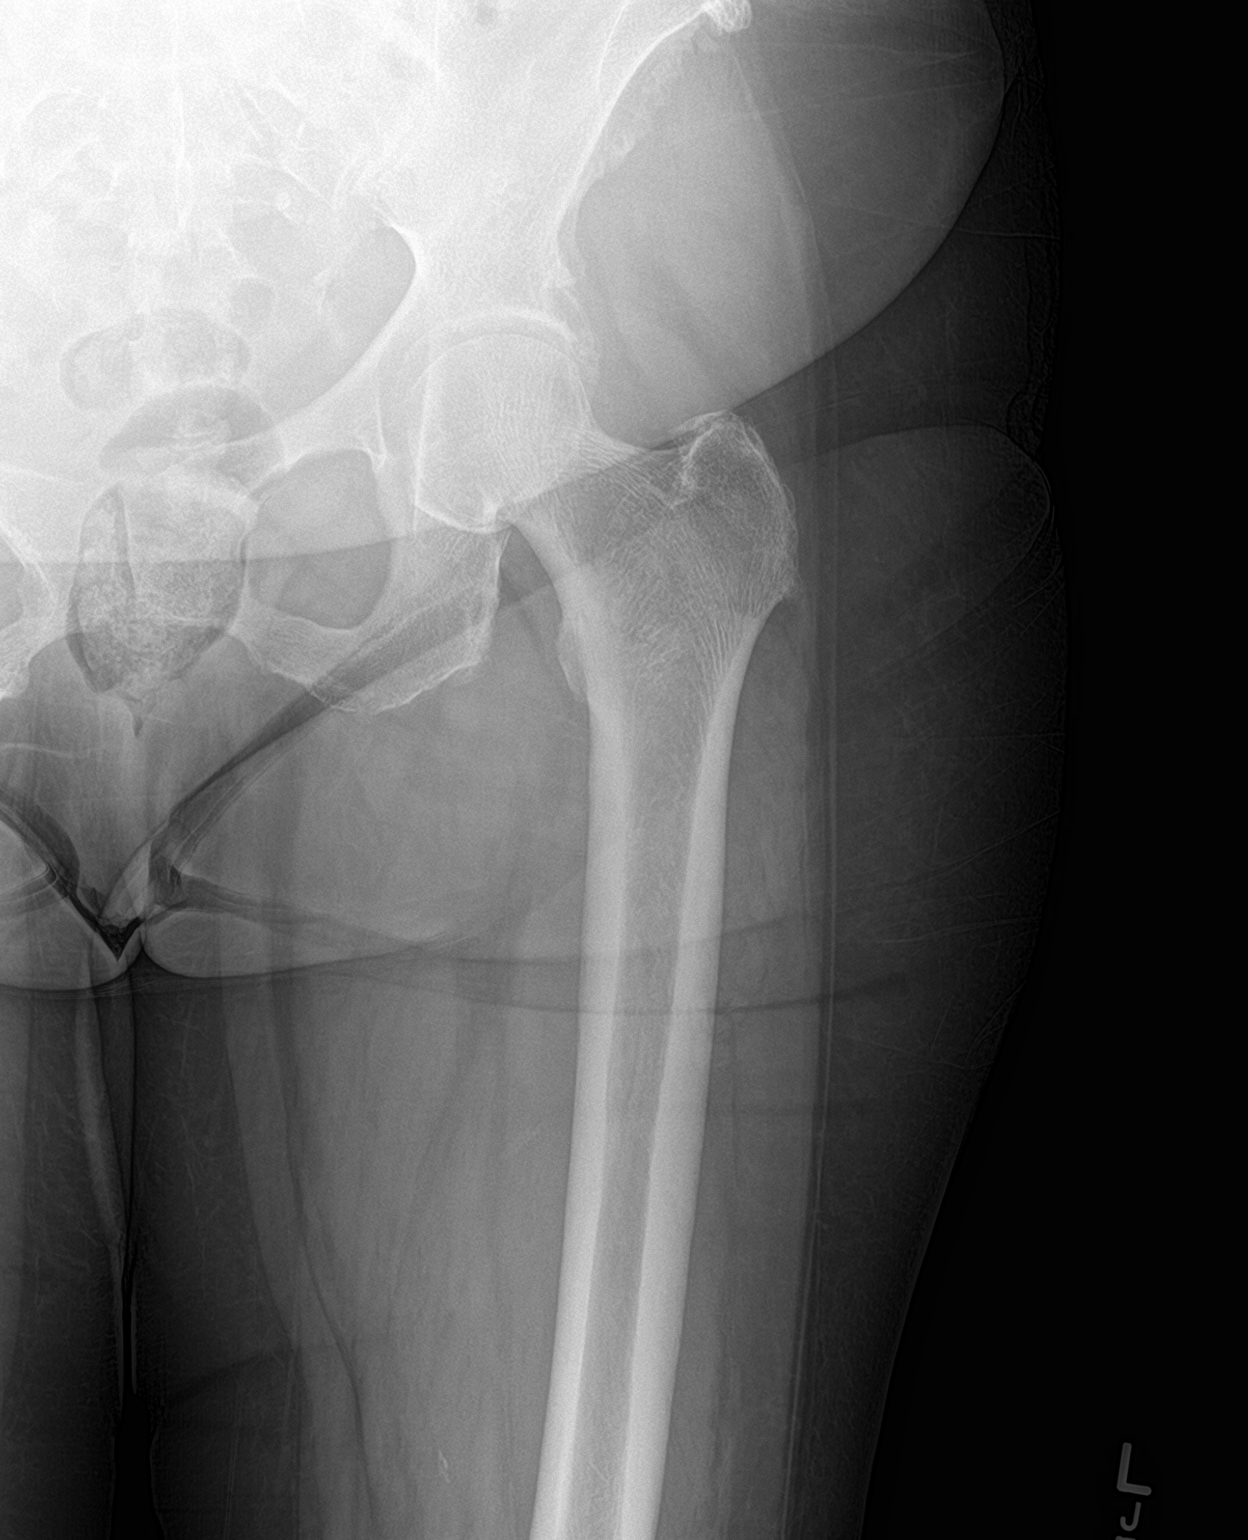

[hip lat]
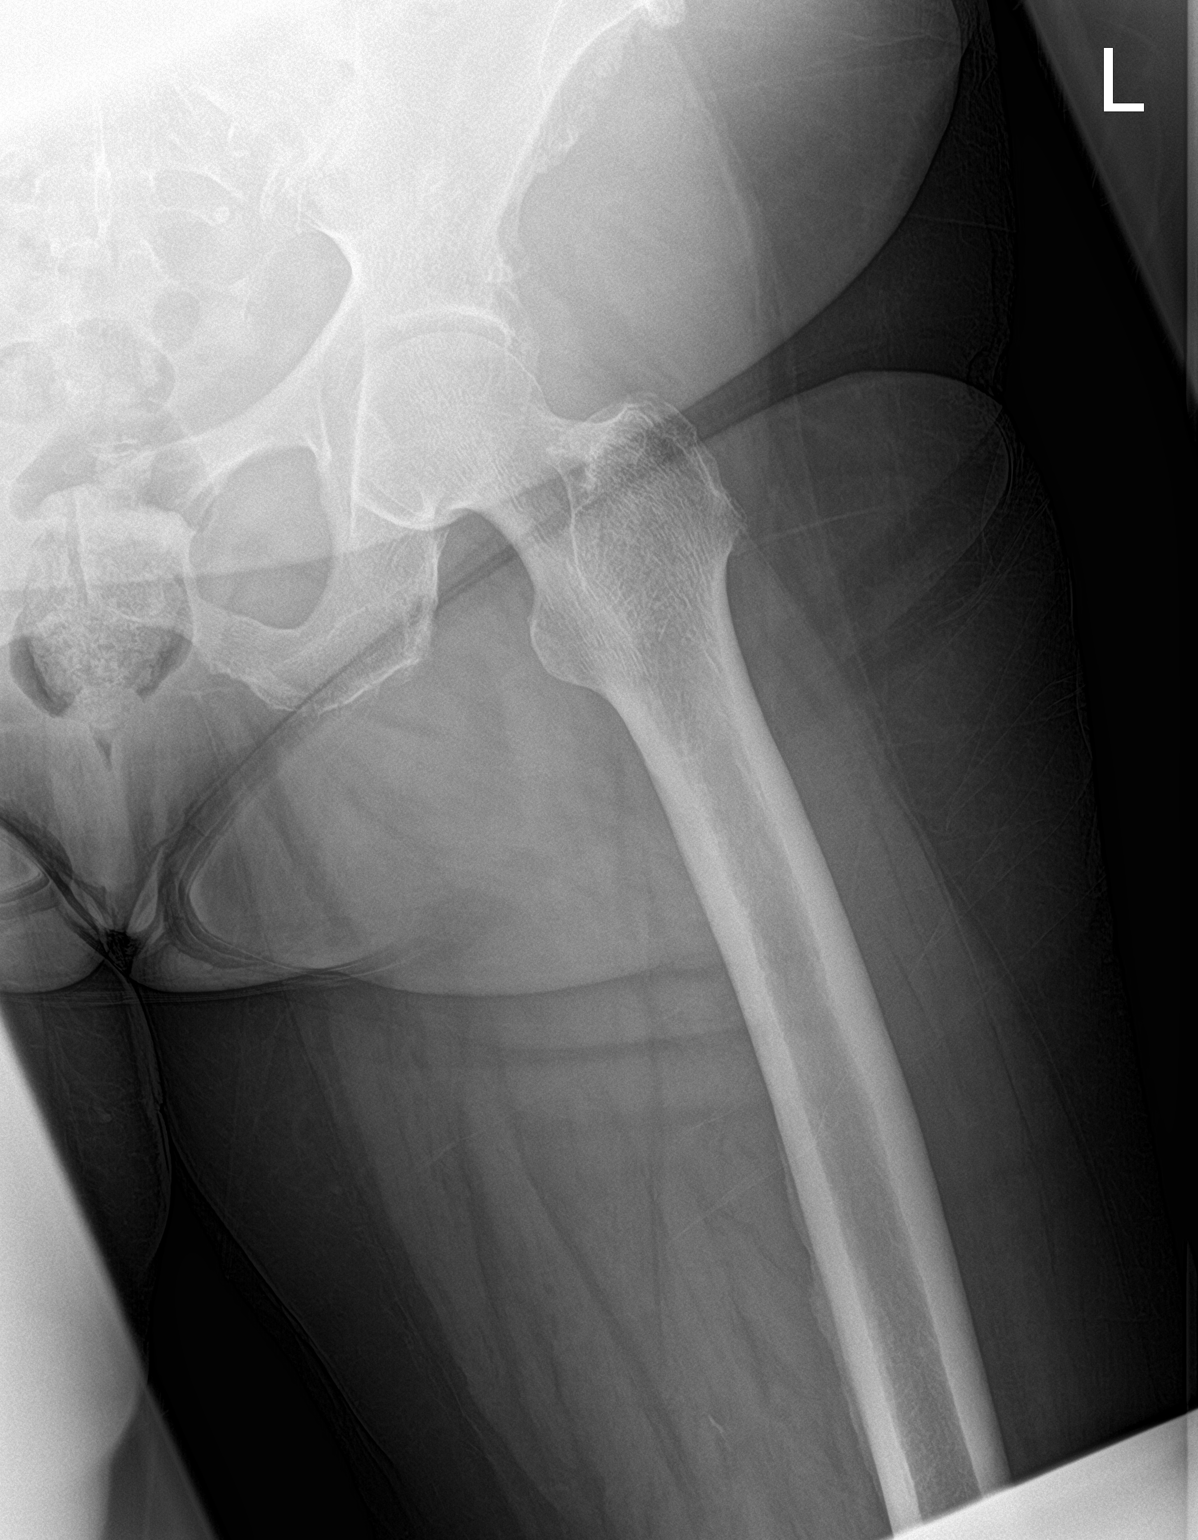

[3 of 3 positions shown; findings below may reference images not displayed]

FINDINGS: There is no evidence of hip fracture or dislocation. There is no
evidence of significant arthropathy or other focal bone abnormality.
IMPRESSION: Negative.

## 2022-10-25 ENCOUNTER — Encounter: Payer: Self-pay | Admitting: Family Medicine

## 2022-10-25 ENCOUNTER — Ambulatory Visit (INDEPENDENT_AMBULATORY_CARE_PROVIDER_SITE_OTHER): Payer: 59 | Admitting: Family Medicine

## 2022-10-25 VITALS — BP 138/83 | HR 90 | Temp 98.1°F | Resp 16 | Ht 63.0 in | Wt 203.0 lb

## 2022-10-25 DIAGNOSIS — Z13220 Encounter for screening for lipoid disorders: Secondary | ICD-10-CM

## 2022-10-25 DIAGNOSIS — E1159 Type 2 diabetes mellitus with other circulatory complications: Secondary | ICD-10-CM | POA: Diagnosis not present

## 2022-10-25 DIAGNOSIS — Z0001 Encounter for general adult medical examination with abnormal findings: Secondary | ICD-10-CM

## 2022-10-25 DIAGNOSIS — Z0000 Encounter for general adult medical examination without abnormal findings: Secondary | ICD-10-CM

## 2022-10-25 DIAGNOSIS — Z780 Asymptomatic menopausal state: Secondary | ICD-10-CM | POA: Diagnosis not present

## 2022-10-25 DIAGNOSIS — Z13820 Encounter for screening for osteoporosis: Secondary | ICD-10-CM

## 2022-10-25 DIAGNOSIS — J329 Chronic sinusitis, unspecified: Secondary | ICD-10-CM

## 2022-10-25 NOTE — Patient Instructions (Signed)
  Carrie Kline , Thank you for taking time to come for your Medicare Wellness Visit. I appreciate your ongoing commitment to your health goals. Please review the following plan we discussed and let me know if I can assist you in the future.   These are the goals we discussed:  Goals   None     This is a list of the screening recommended for you and due dates:  Health Maintenance  Topic Date Due   Yearly kidney health urinalysis for diabetes  Never done   Hepatitis C Screening: USPSTF Recommendation to screen - Ages 60-79 yo.  Never done   DEXA scan (bone density measurement)  Never done   Flu Shot  11/25/2022*   Pneumonia Vaccine (1 of 1 - PCV) 01/03/2023*   Colon Cancer Screening  01/03/2023*   Eye exam for diabetics  12/20/2022   Yearly kidney function blood test for diabetes  03/04/2023   Hemoglobin A1C  03/13/2023   Complete foot exam   10/25/2023   Medicare Annual Wellness Visit  10/25/2023   HPV Vaccine  Aged Out   DTaP/Tdap/Td vaccine  Discontinued   COVID-19 Vaccine  Discontinued   Zoster (Shingles) Vaccine  Discontinued  *Topic was postponed. The date shown is not the original due date.    `

## 2022-10-25 NOTE — Progress Notes (Unsigned)
Subjective:   Orit Sanville is a 77 y.o. female who presents for Medicare Annual (Subsequent) preventive examination.  Review of Systems           Objective:    Today's Vitals   10/25/22 1409  BP: 138/83  Pulse: 90  Resp: 16  Temp: 98.1 F (36.7 C)  TempSrc: Oral  SpO2: 96%  Weight: 203 lb (92.1 kg)  Height:  (1.6 m)   Body mass index is 35.96 kg/m.     05/24/2022    4:41 PM 03/03/2022    1:13 PM 09/28/2021    3:40 PM 08/28/2021    3:26 AM 06/19/2021   11:53 AM 03/31/2021    8:49 AM 03/07/2021    4:31 PM  Advanced Directives  Does Patient Have a Medical Advance Directive? No No No No No No No  Type of Tax inspector;Living will  Does patient want to make changes to medical advance directive?       Yes (Inpatient - patient requests chaplain consult to change a medical advance directive)  Would patient like information on creating a medical advance directive? No - Patient declined   No - Patient declined       Current Medications (verified) Outpatient Encounter Medications as of 10/25/2022  Medication Sig   atorvastatin (LIPITOR) 40 MG tablet TAKE 1 TABLET(40 MG) BY MOUTH DAILY   clopidogrel (PLAVIX) 75 MG tablet Take 1 tablet (75 mg total) by mouth daily.   albuterol (VENTOLIN HFA) 108 (90 Base) MCG/ACT inhaler INHALE 2 PUFFS INTO THE LUNGS EVERY 6 HOURS AS NEEDED FOR WHEEZING OR SHORTNESS OF BREATH (Patient not taking: Reported on 10/25/2022)   fluticasone (FLONASE) 50 MCG/ACT nasal spray Administer 1 spray in each nostril BID as needed. (Patient not taking: Reported on 10/25/2022)   hydrOXYzine (ATARAX) 25 MG tablet Take 25 mg by mouth 4 (four) times daily. (Patient not taking: Reported on 10/25/2022)   pantoprazole (PROTONIX) 40 MG tablet Take 40 mg by mouth daily. (Patient not taking: Reported on 10/25/2022)   promethazine (PHENERGAN) 12.5 MG tablet Take 1 tablet (12.5 mg total) by mouth every 8 (eight) hours as needed for up to 10  doses for nausea or vomiting. (Patient not taking: Reported on 10/25/2022)   No facility-administered encounter medications on file as of 10/25/2022.    Allergies (verified) Codeine, Hydrocodone, Hydrocortisone, and Propoxyphene   History: Past Medical History:  Diagnosis Date   Gout    Seasonal allergies    Stroke [facility])    Past Surgical History:  Procedure Laterality Date   ABDOMINAL HYSTERECTOMY     BACK SURGERY     GANGLION CYST EXCISION     Family History  Problem Relation Age of Onset   Heart attack Father    Diabetes Sister    Thyroid disease Daughter    Social History   Socioeconomic History   Marital status: Widowed    Spouse name: Not on file   Number of children: Not on file   Years of education: Not on file   Highest education level: Not on file  Occupational History   Not on file  Tobacco Use   Smoking status: Never   Smokeless tobacco: Never  Vaping Use   Vaping Use: Never used  Substance and Sexual Activity   Alcohol use: No   Drug use: No   Sexual activity: Not Currently  Other Topics Concern   Not on file  Social History Narrative   Not on file   Social Determinants of Health   Financial Resource Strain: Low Risk  (10/27/2021)   Overall Financial Resource Strain (CARDIA)    Difficulty of Paying Living Expenses: Not hard at all  Food Insecurity: No Food Insecurity (10/27/2021)   Hunger Vital Sign    Worried About Running Out of Food in the Last Year: Never true    Ran Out of Food in the Last Year: Never true  Transportation Needs: No Transportation Needs (10/27/2021)   PRAPARE - Administrator, Civil Service (Medical): No    Lack of Transportation (Non-Medical): No  Physical Activity: Sufficiently Active (10/27/2021)   Exercise Vital Sign    Days of Exercise per Week: 7 days    Minutes of Exercise per Session: 60 min  Stress: No Stress Concern Present (10/27/2021)   [employer] of Occupational Health - Occupational Stress  Questionnaire    Feeling of Stress : Not at all  Social Connections: Moderately Isolated (10/27/2021)   Social Connection and Isolation Panel [NHANES]    Frequency of Communication with Friends and Family: More than three times a week    Frequency of Social Gatherings with Friends and Family: More than three times a week    Attends Religious Services: More than 4 times per year    Active Member of [employer] or Organizations: No    Attends Banker Meetings: Never    Marital Status: Widowed    Tobacco Counseling Counseling given: Not Answered   Clinical Intake:  Pre-visit preparation completed: No  Pain : No/denies pain     Diabetes: Yes CBG done?: No Did pt. bring in CBG monitor from home?: No     Diabetic?  Interpreter Needed?: No      Activities of Daily Living    10/27/2021    1:17 PM  In your present state of health, do you have any difficulty performing the following activities:  Hearing? 0  Vision? 1  Comment wearing glasses  Difficulty concentrating or making decisions? 0  Walking or climbing stairs? 1  Comment she reports some difficulty  Dressing or bathing? 1  Comment dressing she does on her own, bathing she has to have help  Doing errands, shopping? 0  Comment she reports not doing tasks alone, she does not drive    Patient Care Team: Georganna Skeans, MD as PCP - General (Family Medicine)  Indicate any recent Medical Services you may have received from other than [facility] providers in the past year (date may be approximate).     Assessment:   This is a routine wellness examination for Hiyab.  Hearing/Vision screen No results found.  Dietary issues and exercise activities discussed:     Goals Addressed   None   Depression Screen    10/25/2022    2:08 PM 07/05/2022    9:49 AM 03/13/2022    9:11 AM 01/02/2022   10:11 AM 12/26/2021    2:15 PM 10/27/2021    1:11 PM 09/28/2021    3:39 PM  PHQ 2/9 Scores  PHQ - 2 Score 0 0 0 0 0 0 0  PHQ-  9 Score 0 1 2 0       Fall Risk    03/13/2022    9:12 AM 12/26/2021    2:15 PM 10/27/2021    1:17 PM 09/28/2021    3:39 PM 07/11/2021    3:24 PM  Fall Risk   Falls in the  past year? 0 0 0 0 0  Number falls in past yr: 0 0 0    Injury with Fall? 0 0 0    Risk for fall due to :   No Fall Risks    Follow up   Falls evaluation completed      FALL RISK PREVENTION PERTAINING TO THE HOME:  Any stairs in or around the home? No  If so, are there any without handrails? No  Home free of loose throw rugs in walkways, pet beds, electrical cords, etc? Yes  Adequate lighting in your home to reduce risk of falls? Yes   ASSISTIVE DEVICES UTILIZED TO PREVENT FALLS:  Life alert? No  Use of a cane, walker or w/c? No  Grab bars in the bathroom? No  Shower chair or bench in shower? No  Elevated toilet seat or a handicapped toilet? No   TIMED UP AND GO:  Was the test performed? No .  Length of time to ambulate 10 feet:  sec.   Gait slow and steady without use of assistive device  Cognitive Function:        10/27/2021    1:14 PM  6CIT Screen  What Year? 0 points  What month? 0 points  What time? 0 points  Count back from 20 0 points  Months in reverse 0 points  Repeat phrase 2 points  Total Score 2 points    Immunizations Immunization History  Administered Date(s) Administered   Influenza,inj,Quad PF,6+ Mos 07/05/2021   Moderna Sars-Covid-2 Vaccination 10/23/2019, 11/23/2019   PPD Test 05/02/2011    TDAP status: Up to date  Flu Vaccine status: Up to date  Pneumococcal vaccine status: Up to date  Covid-19 vaccine status: Completed vaccines  Qualifies for Shingles Vaccine? No   Zostavax completed No   Shingrix Completed?: No.    Education has been provided regarding the importance of this vaccine. Patient has been advised to call insurance company to determine out of pocket expense if they have not yet received this vaccine. Advised may also receive vaccine at local pharmacy or  Health Dept. Verbalized acceptance and understanding.  Screening Tests Health Maintenance  Topic Date Due   Diabetic kidney evaluation - Urine ACR  Never done   Hepatitis C Screening  Never done   DEXA SCAN  Never done   INFLUENZA VACCINE  11/25/2022 (Originally 03/27/2022)   Pneumonia Vaccine 10+ Years old (1 of 1 - PCV) 01/03/2023 (Originally 07/07/2012)   COLONOSCOPY (Pts 45-28yrs Insurance coverage will need to be confirmed)  01/03/2023 (Originally 07/07/1992)   OPHTHALMOLOGY EXAM  12/20/2022   Diabetic kidney evaluation - eGFR measurement  03/04/2023   HEMOGLOBIN A1C  03/13/2023   FOOT EXAM  10/25/2023   Medicare Annual Wellness (AWV)  10/25/2023   HPV VACCINES  Aged Out   DTaP/Tdap/Td  Discontinued   COVID-19 Vaccine  Discontinued   Zoster Vaccines- Shingrix  Discontinued    Health Maintenance  Health Maintenance Due  Topic Date Due   Diabetic kidney evaluation - Urine ACR  Never done   Hepatitis C Screening  Never done   DEXA SCAN  Never done    Colorectal cancer screening: Type of screening: Colonoscopy. Completed  . Repeat every   years  Mammogram status: No longer required due to age.  Bone Density status: Completed  . Results reflect: Bone density results: NORMAL. Repeat every   years.  Lung Cancer Screening: (Low Dose CT Chest recommended if Age 10-80 years, 30 pack-year currently  smoking OR have quit w/in 15years.) does qualify.   Lung Cancer Screening Referral:   Additional Screening:  Hepatitis C Screening:  qualify; Completed   Vision Screening: Recommended annual ophthalmology exams for early detection of glaucoma and other disorders of the eye. Is the patient up to date with their annual eye exam?  No  Who is the provider or what is the name of the office in which the patient attends annual eye exams?  If pt is not established with a provider, would they like to be referred to a provider to establish care? No .   Dental Screening: Recommended annual  dental exams for proper oral hygiene  Community Resource Referral / Chronic Care Management: CRR required this visit?  No   CCM required this visit?  No      Plan:     I have personally reviewed and noted the following in the patient's chart:   Medical and social history Use of alcohol, tobacco or illicit drugs  Current medications and supplements including opioid prescriptions. Patient is not currently taking opioid prescriptions. Functional ability and status Nutritional status Physical activity Advanced directives List of other physicians Hospitalizations, surgeries, and ER visits in previous 12 months Vitals Screenings to include cognitive, depression, and falls Referrals and appointments  In addition, I have reviewed and discussed with patient certain preventive protocols, quality metrics, and best practice recommendations. A written personalized care plan for preventive services as well as general preventive health recommendations were provided to patient.     Kieth Brightly, RMA   10/25/2022   Nurse Notes:

## 2022-10-29 ENCOUNTER — Encounter: Payer: Self-pay | Admitting: Family Medicine

## 2022-10-29 NOTE — Progress Notes (Unsigned)
Established Patient Office Visit  Subjective    Patient ID: Carrie Kline, female    DOB: 01/04/1946  Age: 77 y.o. MRN: [id number]  CC: No chief complaint on file.   HPI Carrie Kline presents for routine follow up of chronic med issues as well as continued complaint of sinus pain and congestion.    Outpatient Encounter Medications as of 10/25/2022  Medication Sig   atorvastatin (LIPITOR) 40 MG tablet TAKE 1 TABLET(40 MG) BY MOUTH DAILY   clopidogrel (PLAVIX) 75 MG tablet Take 1 tablet (75 mg total) by mouth daily.   albuterol (VENTOLIN HFA) 108 (90 Base) MCG/ACT inhaler INHALE 2 PUFFS INTO THE LUNGS EVERY 6 HOURS AS NEEDED FOR WHEEZING OR SHORTNESS OF BREATH (Patient not taking: Reported on 10/25/2022)   fluticasone (FLONASE) 50 MCG/ACT nasal spray Administer 1 spray in each nostril BID as needed. (Patient not taking: Reported on 10/25/2022)   hydrOXYzine (ATARAX) 25 MG tablet Take 25 mg by mouth 4 (four) times daily. (Patient not taking: Reported on 10/25/2022)   pantoprazole (PROTONIX) 40 MG tablet Take 40 mg by mouth daily. (Patient not taking: Reported on 10/25/2022)   promethazine (PHENERGAN) 12.5 MG tablet Take 1 tablet (12.5 mg total) by mouth every 8 (eight) hours as needed for up to 10 doses for nausea or vomiting. (Patient not taking: Reported on 10/25/2022)   No facility-administered encounter medications on file as of 10/25/2022.    Past Medical History:  Diagnosis Date   Gout    Seasonal allergies    Stroke [facility])     Past Surgical History:  Procedure Laterality Date   ABDOMINAL HYSTERECTOMY     BACK SURGERY     GANGLION CYST EXCISION      Family History  Problem Relation Age of Onset   Heart attack Father    Diabetes Sister    Thyroid disease Daughter     Social History   Socioeconomic History   Marital status: Widowed    Spouse name: Not on file   Number of children: Not on file   Years of education: Not on file   Highest education level: Not on file   Occupational History   Not on file  Tobacco Use   Smoking status: Never   Smokeless tobacco: Never  Vaping Use   Vaping Use: Never used  Substance and Sexual Activity   Alcohol use: No   Drug use: No   Sexual activity: Not Currently  Other Topics Concern   Not on file  Social History Narrative   Not on file   Social Determinants of Health   Financial Resource Strain: Low Risk  (10/27/2021)   Overall Financial Resource Strain (CARDIA)    Difficulty of Paying Living Expenses: Not hard at all  Food Insecurity: No Food Insecurity (10/27/2021)   Hunger Vital Sign    Worried About Running Out of Food in the Last Year: Never true    Ran Out of Food in the Last Year: Never true  Transportation Needs: No Transportation Needs (10/27/2021)   PRAPARE - Administrator, Civil Service (Medical): No    Lack of Transportation (Non-Medical): No  Physical Activity: Sufficiently Active (10/27/2021)   Exercise Vital Sign    Days of Exercise per Week: 7 days    Minutes of Exercise per Session: 60 min  Stress: No Stress Concern Present (10/27/2021)   [employer] of Occupational Health - Occupational Stress Questionnaire    Feeling of Stress : Not at all  Social Connections: Moderately Isolated (10/27/2021)   Social Connection and Isolation Panel [NHANES]    Frequency of Communication with Friends and Family: More than three times a week    Frequency of Social Gatherings with Friends and Family: More than three times a week    Attends Religious Services: More than 4 times per year    Active Member of [employer] or Organizations: No    Attends Banker Meetings: Never    Marital Status: Widowed  Intimate Partner Violence: Not At Risk (10/27/2021)   Humiliation, Afraid, Rape, and Kick questionnaire    Fear of Current or Ex-Partner: No    Emotionally Abused: No    Physically Abused: No    Sexually Abused: No    Review of Systems  Constitutional:  Negative for chills and fever.   HENT:  Positive for congestion and sinus pain.   All other systems reviewed and are negative.       Objective    BP 138/83   Pulse 90   Temp 98.1 F (36.7 C) (Oral)   Resp 16   Ht  (1.6 m)   Wt 203 lb (92.1 kg)   SpO2 96%   BMI 35.96 kg/m   Physical Exam Vitals and nursing note reviewed.  Constitutional:      General: She is not in acute distress. HENT:     Head: Normocephalic and atraumatic.     Right Ear: Tympanic membrane, ear canal and external ear normal.     Left Ear: Tympanic membrane, ear canal and external ear normal.     Nose:     Right Sinus: Maxillary sinus tenderness present.     Left Sinus: Maxillary sinus tenderness present.     Mouth/Throat:     Mouth: Mucous membranes are moist.     Pharynx: Oropharynx is clear.  Eyes:     Conjunctiva/sclera: Conjunctivae normal.     Pupils: Pupils are equal, round, and reactive to light.  Neck:     Thyroid: No thyromegaly.  Cardiovascular:     Rate and Rhythm: Normal rate and regular rhythm.     Heart sounds: Normal heart sounds. No murmur heard. Pulmonary:     Effort: Pulmonary effort is normal. No respiratory distress.     Breath sounds: Normal breath sounds.  Abdominal:     General: There is no distension.     Palpations: Abdomen is soft. There is no mass.     Tenderness: There is no abdominal tenderness.  Musculoskeletal:        General: Normal range of motion.     Cervical back: Normal range of motion and neck supple.  Skin:    General: Skin is warm and dry.  Neurological:     General: No focal deficit present.     Mental Status: She is alert and oriented to person, place, and time.  Psychiatric:        Mood and Affect: Mood normal.        Behavior: Behavior normal.     {Labs (Optional):23779}    Assessment & Plan:   1. Encounter for Medicare annual wellness exam   2. Annual physical exam  - CMP14+EGFR  3. Screening for lipid disorders  - Lipid Panel  4. Encounter for  osteoporosis screening in asymptomatic postmenopausal patient  - DG Bone Density; Future  5. Type 2 diabetes mellitus with other circulatory complication, without long-term current use of insulin (HCC) Recent A1c was at goal.  - Microalbumin /  creatinine urine ratio - HM DIABETES FOOT EXAM  6. Chronic sinusitis, unspecified location  - Ambulatory referral to ENT    Return in 1 year (on 10/25/2023).   Tommie Raymond, MD

## 2022-10-30 ENCOUNTER — Encounter: Payer: 59 | Admitting: Family Medicine

## 2022-10-30 NOTE — Progress Notes (Signed)
Error   This encounter was created in error - please disregard. 

## 2022-10-31 LAB — LIPID PANEL
Chol/HDL Ratio: 3.1 ratio (ref 0.0–4.4)
Cholesterol, Total: 116 mg/dL (ref 100–199)
HDL: 37 mg/dL — ABNORMAL LOW (ref >39)
LDL Chol Calc (NIH): 57 mg/dL (ref 0–99)
Triglycerides: 120 mg/dL (ref 0–149)
VLDL Cholesterol Cal: 22 mg/dL (ref 5–40)

## 2022-10-31 LAB — CMP14+EGFR
ALT: 15 IU/L (ref 0–32)
AST: 16 IU/L (ref 0–40)
Albumin/Globulin Ratio: 1.2 (ref 1.2–2.2)
Albumin: 4.1 g/dL (ref 3.8–4.8)
Alkaline Phosphatase: 140 IU/L — ABNORMAL HIGH (ref 44–121)
BUN/Creatinine Ratio: 20 (ref 12–28)
BUN: 17 mg/dL (ref 8–27)
Bilirubin Total: 0.3 mg/dL (ref 0.0–1.2)
CO2: 20 mmol/L (ref 20–29)
Calcium: 9.9 mg/dL (ref 8.7–10.3)
Chloride: 108 mmol/L — ABNORMAL HIGH (ref 96–106)
Creatinine, Ser: 0.84 mg/dL (ref 0.57–1.00)
Globulin, Total: 3.4 g/dL (ref 1.5–4.5)
Glucose: 125 mg/dL — ABNORMAL HIGH (ref 70–99)
Potassium: 4.3 mmol/L (ref 3.5–5.2)
Sodium: 144 mmol/L (ref 134–144)
Total Protein: 7.5 g/dL (ref 6.0–8.5)
eGFR: 72 mL/min/{1.73_m2} (ref >59)

## 2022-10-31 LAB — MICROALBUMIN / CREATININE URINE RATIO
Creatinine, Urine: 120.4 mg/dL
Microalb/Creat Ratio: 37 mg/g creat — ABNORMAL HIGH (ref 0–29)
Microalbumin, Urine: 44.5 ug/mL

## 2022-11-06 ENCOUNTER — Ambulatory Visit: Payer: Self-pay

## 2022-11-06 NOTE — Telephone Encounter (Signed)
  Chief Complaint: Heartburn Symptoms: Sour taste in mouth, burping, burning feeling Frequency: Off and on since Sunday Pertinent Negatives: Patient denies SOB, Disposition: []ED /[x]Urgent Care (no appt availability in office) / [x]Appointment(In office/virtual)/ [] [facility]/ []Home Care/ []Refused Recommended Disposition /[][facility]/ [] Follow-up with PCP Additional Notes: Pt called for a antacid medication. PT states that in the past she took Prilosec, and would like that again. Triaged PT  - pt should be seen within the next 24 hours. Pt is leaving town this afternoon and will not be back until Sunday. Made first available appt which is for 11/14/2022. PT will go to UC if needed. Appt made outside of triage protocol. Please advise. Pt would like medication called in for heart burn.    Pt is calling to see if she can get a prescription called in for Acid Reflux. Pt states that she is experiencing heart burn. Please advise  Reason for Disposition  [1] Patient says chest pain feels exactly the same as previously diagnosed "heartburn" AND [2] describes burning in chest AND [3] accompanying sour taste in mouth  Answer Assessment - Initial Assessment Questions 1. LOCATION: "Where does it hurt?"       Burning needs to burp 2. RADIATION: "Does the pain go anywhere else?" (e.g., into neck, jaw, arms, back)     no 3. ONSET: "When did the chest pain begin?" (Minutes, hours or days)      Sunday 4. PATTERN: "Does the pain come and go, or has it been constant since it started?"  "Does it get worse with exertion?"      yes 5. DURATION: "How long does it last" (e.g., seconds, minutes, hours)     30  minutes - 10 minutes 6. SEVERITY: "How bad is the pain?"  (e.g., Scale 1-10; mild, moderate, or severe)    - MILD (1-3): doesn't interfere with normal activities     - MODERATE (4-7): interferes with normal activities or awakens from sleep    - SEVERE (8-10): excruciating  pain, unable to do any normal activities       mild 7. CARDIAC RISK FACTORS: "Do you have any history of heart problems or risk factors for heart disease?" (e.g., angina, prior heart attack; diabetes, high blood pressure, high cholesterol, smoker, or strong family history of heart disease)     no 8. PULMONARY RISK FACTORS: "Do you have any history of lung disease?"  (e.g., blood clots in lung, asthma, emphysema, birth control pills)     no 9. CAUSE: "What do you think is causing the chest pain?"     Heart burn 10. OTHER SYMPTOMS: "Do you have any other symptoms?" (e.g., dizziness, nausea, vomiting, sweating, fever, difficulty breathing, cough)       none  Protocols used: Chest Pain-A-AH

## 2022-11-14 ENCOUNTER — Ambulatory Visit: Payer: 59 | Admitting: Family Medicine

## 2022-12-12 ENCOUNTER — Telehealth: Payer: Self-pay

## 2022-12-12 NOTE — Telephone Encounter (Signed)
Carrie Kline is requesting an order for a right leg brace replacement. The current one is not working well and is bulky around the top.     Call back phone [phone].

## 2022-12-13 NOTE — Telephone Encounter (Signed)
I spoke with Carrie Kline and she thinks it will be ok for now. She will call back if she changes her mind.

## 2022-12-17 DIAGNOSIS — H25811 Combined forms of age-related cataract, right eye: Secondary | ICD-10-CM | POA: Diagnosis not present

## 2022-12-17 DIAGNOSIS — H25812 Combined forms of age-related cataract, left eye: Secondary | ICD-10-CM | POA: Diagnosis not present

## 2023-01-03 DIAGNOSIS — H25812 Combined forms of age-related cataract, left eye: Secondary | ICD-10-CM | POA: Diagnosis not present

## 2023-01-20 ENCOUNTER — Other Ambulatory Visit: Payer: Self-pay | Admitting: Family Medicine

## 2023-01-22 ENCOUNTER — Other Ambulatory Visit: Payer: Self-pay | Admitting: *Deleted

## 2023-01-22 MED ORDER — FLUTICASONE PROPIONATE 50 MCG/ACT NA SUSP: NASAL | 3 refills | Status: AC

## 2023-01-22 NOTE — Telephone Encounter (Signed)
Requested Prescriptions  Pending Prescriptions Disp Refills   fluticasone (FLONASE) 50 MCG/ACT nasal spray [Pharmacy Med Name: FLUTICASONE NASAL SP (120) RX] 48 g     Sig: SHAKE LIQUID AND USE 1 SPRAY IN EACH NOSTRIL TWICE DAILY AS NEEDED     Ear, Nose, and Throat: Nasal Preparations - Corticosteroids Passed - 01/20/2023  3:32 AM      Passed - Valid encounter within last 12 months    Recent Outpatient Visits           2 months ago Encounter for Medicare annual wellness exam   [place] [facility], MD   6 months ago Acute maxillary sinusitis, recurrence not specified   [place] [facility], Lauris Poag, MD   10 months ago Acute left-sided low back pain with left-sided sciatica   [place] [facility], Kasandra Knudsen, PA-C   1 year ago Status post CVA   [place] [facility], MD   1 year ago COVID-19   [facility], Gildardo Pounds, NP       Future Appointments             In 1 month Georganna Skeans, MD [facility]

## 2023-01-24 DIAGNOSIS — H25811 Combined forms of age-related cataract, right eye: Secondary | ICD-10-CM | POA: Diagnosis not present

## 2023-02-25 ENCOUNTER — Ambulatory Visit: Payer: 59 | Admitting: Family Medicine

## 2023-02-26 ENCOUNTER — Other Ambulatory Visit: Payer: Self-pay

## 2023-02-26 ENCOUNTER — Emergency Department (HOSPITAL_BASED_OUTPATIENT_CLINIC_OR_DEPARTMENT_OTHER): Payer: 59

## 2023-02-26 ENCOUNTER — Encounter (HOSPITAL_BASED_OUTPATIENT_CLINIC_OR_DEPARTMENT_OTHER): Payer: Self-pay | Admitting: Urology

## 2023-02-26 ENCOUNTER — Emergency Department (HOSPITAL_BASED_OUTPATIENT_CLINIC_OR_DEPARTMENT_OTHER)
Admission: EM | Admit: 2023-02-26 | Discharge: 2023-02-26 | Disposition: A | Discharge status: Home / Self Care | Payer: 59 | Attending: Emergency Medicine | Admitting: Emergency Medicine

## 2023-02-26 DIAGNOSIS — Z7902 Long term (current) use of antithrombotics/antiplatelets: Secondary | ICD-10-CM | POA: Diagnosis not present

## 2023-02-26 DIAGNOSIS — R058 Other specified cough: Secondary | ICD-10-CM | POA: Diagnosis not present

## 2023-02-26 DIAGNOSIS — U071 COVID-19: Secondary | ICD-10-CM

## 2023-02-26 DIAGNOSIS — R059 Cough, unspecified: Secondary | ICD-10-CM | POA: Diagnosis present

## 2023-02-26 DIAGNOSIS — R030 Elevated blood-pressure reading, without diagnosis of hypertension: Secondary | ICD-10-CM | POA: Diagnosis not present

## 2023-02-26 DIAGNOSIS — R051 Acute cough: Secondary | ICD-10-CM

## 2023-02-26 DIAGNOSIS — I10 Essential (primary) hypertension: Secondary | ICD-10-CM | POA: Diagnosis not present

## 2023-02-26 LAB — RESP PANEL BY RT-PCR (RSV, FLU A&B, COVID)  RVPGX2
Influenza A by PCR: NEGATIVE
Influenza B by PCR: NEGATIVE
Resp Syncytial Virus by PCR: NEGATIVE
SARS Coronavirus 2 by RT PCR: POSITIVE — AB

## 2023-02-26 MED ORDER — GUAIFENESIN-DM 100-10 MG/5ML PO SYRP
10.0000 mL | ORAL_SOLUTION | Freq: Once | ORAL | Status: AC
  Administered 2023-02-26: 10 mL via ORAL
  Filled 2023-02-26: qty 10

## 2023-02-26 MED ORDER — ACETAMINOPHEN 325 MG PO TABS
650.0000 mg | ORAL_TABLET | Freq: Once | ORAL | Status: AC
  Administered 2023-02-26: 650 mg via ORAL
  Filled 2023-02-26: qty 2

## 2023-02-26 NOTE — Discharge Instructions (Signed)
It was our pleasure to provide your ER care today - we hope that you feel better. Your covid test is positive - see attached info.   Drink plenty of fluids/stay well hydrated. Stay active, take full and deep breaths.   You may try myquil, mucinex, robitussin or similar over the counter cold and flu medication for symptom relief.   Follow up with primary care doctor in two weeks if symptoms fail to improve/resolve. Also follow up with primary care doctor in a couple weeks regarding your blood pressure that is high today.   Return to ER if worse, new symptoms, increased trouble breathing, or other emergency concern.

## 2023-02-26 NOTE — ED Triage Notes (Signed)
Pt states productive cough, congestion, and wheezing that started yesterday   H/o asthma, no inhaler at home

## 2023-02-26 NOTE — ED Provider Notes (Signed)
[facility] Provider Note   CSN: [id number] Arrival date & time: 02/26/23  1531     History  Chief Complaint  Patient presents with   Cough    Carrie Kline is a 77 y.o. female.  Pt c/o cough, occasionally prod clear phlegm and nasal congestion in the past  2 days. Symptoms acute onset, moderate. Denies known ill contacts. No chest pain or sob. No abd pain or nvd. No extremity pain or swelling. Unaware of fevers.   The history is provided by the patient and medical records.  Cough Associated symptoms: rhinorrhea   Associated symptoms: no chest pain, no fever, no headaches, no rash, no shortness of breath and no sore throat        Home Medications Prior to Admission medications   Medication Sig Start Date End Date Taking? Authorizing Provider  albuterol (VENTOLIN HFA) 108 (90 Base) MCG/ACT inhaler INHALE 2 PUFFS INTO THE LUNGS EVERY 6 HOURS AS NEEDED FOR WHEEZING OR SHORTNESS OF BREATH Patient not taking: Reported on 10/25/2022 02/28/22   Georganna Skeans, MD  atorvastatin (LIPITOR) 40 MG tablet TAKE 1 TABLET(40 MG) BY MOUTH DAILY 10/16/22   Georganna Skeans, MD  clopidogrel (PLAVIX) 75 MG tablet Take 1 tablet (75 mg total) by mouth daily. 09/18/22   Ihor Austin, NP  fluticasone (FLONASE) 50 MCG/ACT nasal spray SHAKE LIQUID AND USE 1 SPRAY IN EACH NOSTRIL TWICE DAILY AS NEEDED 01/22/23   Georganna Skeans, MD  hydrOXYzine (ATARAX) 25 MG tablet Take 25 mg by mouth 4 (four) times daily. Patient not taking: Reported on 10/25/2022 12/14/21   [provider]  pantoprazole (PROTONIX) 40 MG tablet Take 40 mg by mouth daily. Patient not taking: Reported on 10/25/2022 01/16/22   [provider]  promethazine (PHENERGAN) 12.5 MG tablet Take 1 tablet (12.5 mg total) by mouth every 8 (eight) hours as needed for up to 10 doses for nausea or vomiting. Patient not taking: Reported on 10/25/2022 05/24/22   Virgina Norfolk, DO      Allergies     Codeine, Hydrocodone, Hydrocortisone, and Propoxyphene    Review of Systems   Review of Systems  Constitutional:  Negative for fever.  HENT:  Positive for congestion and rhinorrhea. Negative for sore throat and trouble swallowing.   Eyes:  Negative for redness.  Respiratory:  Positive for cough. Negative for shortness of breath.   Cardiovascular:  Negative for chest pain and leg swelling.  Gastrointestinal:  Negative for abdominal pain, diarrhea and vomiting.  Genitourinary:  Negative for flank pain.  Musculoskeletal:  Negative for back pain, neck pain and neck stiffness.  Skin:  Negative for rash.  Neurological:  Negative for headaches.  Hematological:  Does not bruise/bleed easily.  Psychiatric/Behavioral:  Negative for confusion.     Physical Exam Updated Vital Signs BP (!) 173/84 (BP Location: Right Arm)   Pulse 99   Temp 98.9 F (37.2 C)   Resp 19   Ht 1.6 m (5\' 3" )   Wt 90.3 kg   SpO2 97%   BMI 35.25 kg/m  Physical Exam Vitals and nursing note reviewed.  Constitutional:      Appearance: Normal appearance. She is well-developed.  HENT:     Head: Atraumatic.     Nose: Congestion present.     Mouth/Throat:     Mouth: Mucous membranes are moist.  Eyes:     General: No scleral icterus.    Conjunctiva/sclera: Conjunctivae normal.     Pupils:  Pupils are equal, round, and reactive to light.  Neck:     Trachea: No tracheal deviation.     Comments: No stiffness or rigidity.  Cardiovascular:     Rate and Rhythm: Normal rate and regular rhythm.     Pulses: Normal pulses.     Heart sounds: Normal heart sounds. No murmur heard.    No friction rub. No gallop.  Pulmonary:     Effort: Pulmonary effort is normal. No respiratory distress.     Breath sounds: Normal breath sounds. No stridor. No wheezing, rhonchi or rales.  Abdominal:     General: There is no distension.     Palpations: Abdomen is soft.     Tenderness: There is no abdominal tenderness.  Genitourinary:     Comments: No cva tenderness.  Musculoskeletal:        General: No swelling or tenderness.     Cervical back: Normal range of motion and neck supple. No rigidity. No muscular tenderness.  Lymphadenopathy:     Cervical: No cervical adenopathy.  Skin:    General: Skin is warm and dry.     Findings: No rash.  Neurological:     Mental Status: She is alert.     Comments: Alert, speech normal.   Psychiatric:        Mood and Affect: Mood normal.     ED Results / Procedures / Treatments   Labs (all labs ordered are listed, but only abnormal results are displayed) Results for orders placed or performed during the hospital encounter of 02/26/23  Resp panel by RT-PCR (RSV, Flu A&B, Covid) Anterior Nasal Swab   Specimen: Anterior Nasal Swab  Result Value Ref Range   SARS Coronavirus 2 by RT PCR POSITIVE (A) NEGATIVE   Influenza A by PCR NEGATIVE NEGATIVE   Influenza B by PCR NEGATIVE NEGATIVE   Resp Syncytial Virus by PCR NEGATIVE NEGATIVE   DG Chest 2 View  Result Date: 02/26/2023 CLINICAL DATA:  Productive cough. EXAM: CHEST - 2 VIEW COMPARISON:  05/16/2022 FINDINGS: The lungs are clear without focal pneumonia, edema, pneumothorax or pleural effusion. The cardiopericardial silhouette is within normal limits for size. No acute bony abnormality. IMPRESSION: No active cardiopulmonary disease. Electronically Signed   By: Kennith Center M.D.   On: 02/26/2023 16:16     EKG None  Radiology DG Chest 2 View  Result Date: 02/26/2023 CLINICAL DATA:  Productive cough. EXAM: CHEST - 2 VIEW COMPARISON:  05/16/2022 FINDINGS: The lungs are clear without focal pneumonia, edema, pneumothorax or pleural effusion. The cardiopericardial silhouette is within normal limits for size. No acute bony abnormality. IMPRESSION: No active cardiopulmonary disease. Electronically Signed   By: Kennith Center M.D.   On: 02/26/2023 16:16    Procedures Procedures    Medications Ordered in ED Medications   guaiFENesin-dextromethorphan (ROBITUSSIN DM) 100-10 MG/5ML syrup 10 mL (10 mLs Oral Given 02/26/23 1702)  acetaminophen (TYLENOL) tablet 650 mg (650 mg Oral Given 02/26/23 1701)    ED Course/ Medical Decision Making/ A&P                             Medical Decision Making Problems Addressed: Acute cough: acute illness or injury with systemic symptoms COVID-19 virus infection: acute illness or injury with systemic symptoms that poses a threat to life or bodily functions Elevated blood pressure reading: acute illness or injury  Amount and/or Complexity of Data Reviewed Independent Historian:  Details: Family, hx External Data Reviewed: notes. Labs: ordered. Decision-making details documented in ED Course. Radiology: ordered and independent interpretation performed. Decision-making details documented in ED Course.  Risk OTC drugs. Decision regarding hospitalization.   Labs ordered/sent. Imaging ordered.   Differential diagnosis includes pneumonia, covid, flu, etc. Dispo decision including potential need for admission considered - will get labs and imaging and reassess.   Reviewed nursing notes and prior charts for additional history. External reports reviewed.   Labs reviewed/interpreted by me - covid positive. Discussed w pt. Pt indicates has been vaccinated for covid.   Acetaminophen po, robitussin po. Po fluids.   Xrays reviewed/interpreted by me - no pna.  Pt is breathing comfortably, no increased wob, pulse ox 100%.   Pt currently appears stable for d/c.   Rec close pcp f/u.  Return precautions provided.          Final Clinical Impression(s) / ED Diagnoses Final diagnoses:  Acute cough  COVID-19 virus infection  Elevated blood pressure reading    Rx / DC Orders ED Discharge Orders     None         Cathren Laine, MD 02/26/23 1712

## 2023-02-26 NOTE — ED Notes (Signed)
Pt A&OX4 ambulatory with cane at d/c. Pt verbalized understanding of d/c instructions and follow up care.

## 2023-03-25 ENCOUNTER — Other Ambulatory Visit: Payer: Self-pay | Admitting: Anesthesiology

## 2023-03-25 ENCOUNTER — Telehealth: Payer: Self-pay | Admitting: Adult Health

## 2023-03-25 ENCOUNTER — Encounter: Payer: Self-pay | Admitting: Anesthesiology

## 2023-03-25 MED ORDER — CLOPIDOGREL BISULFATE 75 MG PO TABS: 75.0000 mg | ORAL_TABLET | Freq: Every day | ORAL | 0 refills | Status: DC

## 2023-03-25 NOTE — Telephone Encounter (Signed)
Pt request refill for clopidogrel (PLAVIX) 75 MG tablet at  [facility] [phone]

## 2023-04-08 ENCOUNTER — Other Ambulatory Visit: Payer: Self-pay | Admitting: Family Medicine

## 2023-04-15 ENCOUNTER — Other Ambulatory Visit: Payer: Self-pay

## 2023-04-15 MED ORDER — CLOPIDOGREL BISULFATE 75 MG PO TABS: 75.0000 mg | ORAL_TABLET | Freq: Every day | ORAL | 0 refills | Status: DC

## 2023-04-17 NOTE — Progress Notes (Deleted)
[facility] [address] [place]. [place] [address] [phone]       STROKE FOLLOW UP NOTE  [name] Date of Birth:  01-26-1946 Medical Record Number:  [id number]   Reason for Referral:  Stroke follow up    SUBJECTIVE:   CHIEF COMPLAINT:  No chief complaint on file.   HPI:   Update 07/31/2022 JM: Patient returns for stroke follow-up after prior visit 1 year ago unaccompanied.  Overall stable without any new stroke/TIA symptoms.  Reports continued right-sided weakness, improvement noted since prior visit, more ROM in arm and leg and feels stronger.  Ambulates with cane, denies any recent falls. Continues to do exercises at home as previously recommended by therapies.   She has been having issues with sinus infections, currently being followed by PCP. Recently completed 2 rounds of antibiotics. Was having issues with mild headaches since onset of sinus infection, has been gradually improving. Does have chronic sinus related issues.   Compliant on Plavix and atorvastatin Blood pressure well controlled typically, at home typically 130s/60-70s Routinely follows with PCP     History provided for reference purposes only Update 08/01/2021 JM: Returns for 23-month stroke follow-up accompanied by her daughter, Carrie Kline.  Presented to ER on 06/19/2021 with left-sided facial weakness upon awakening and resolved within an hour. Per review of ER note, they were unable to appreciate any significant facial weakness.  No other new stroke/TIA symptoms.  Concern of possible TIA vs small stroke - CTA head/neck completed which did not show any new stroke and stable appearance of intracranial stenosis compared to prior imaging. She has not had any reoccurring or new stroke/TIA symptoms since that time  Reports continued right-sided weakness but overall greatly improving Continues to work with PT/OT.  Ambulates with cane - no recent falls  Compliant on aspirin and  atorvastatin -denies side effects Blood pressure today elevated at 174/97 and on recheck 160/96 - does not routinely monitor at home She is recovering from the flu and has had mild generalized headache since that time. She questions if she has now developed a sinus infection. She has been increasing intake of canned soup since being sick.   No further concerns at this time  Initial visit 05/08/2021 JM: Carrie Kline is being seen for hospital follow-up accompanied by her daughter, Carrie Kline. Residual right sided weakness with improvement working with therapies.  She does have occasional shoulder stiffness and spasm sensation but denies associated pain.  Currently using RW and AFO brace without any recent falls.  Therapy has been working with her on transitioning to cane.  Denies any residual speech difficulty.  Her daughter has been staying with her to assist as needed.  Denies new stroke/TIA symptoms. On aspirin and atorvastatin. Blood pressure today 138/80- does not monitor at home.  No further concerns at this time.  Stroke admission 02/24/2021 [name] is a 77 y.o. female with history of chronic headache admitted on 02/24/2021 for right-sided weakness, slurred speech and right facial droop.  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  EF 55 to 60%.  LDL 121 - started on atorvastatin 40mg  daily.  A1c 7.4 (new dx of DM) -metformin initiated.  Recommended DAPT for 3 weeks and aspirin alone.  No prior stroke history.  Therapy evaluation recommended CIR with residual right hemiparesis, dysarthria and gait impairment.    PERTINENT IMAGING/LABs  CTA head/neck 06/20/2019 0 IMPRESSION: No acute  head CT finding. Subtle evidence of the left pontine stroke which was acute in July of this year. Otherwise negative. No intracranial large or medium vessel occlusion. Minimal atherosclerotic change at the left carotid bifurcation  but no stenosis. 50% stenosis of the left vertebral artery origin. Atherosclerotic disease and tortuosity of the left vertebral artery V4 segment, but without stenosis greater than 30%. More distal PCA branches show atherosclerotic irregularity.   Per hospitalization 02/24/2021 CT no acute abnormality. CT head and neck left VA origin in the bilateral distal PCA branches stenosis MRI left pontine infarct 2D Echo EF 55 to 60% LDL 121 HgbA1c 7.4      ROS:   14 system review of systems performed and negative with exception of those listed in HPI  PMH:  Past Medical History:  Diagnosis Date   Gout    Seasonal allergies    Stroke (HCC)     PSH:  Past Surgical History:  Procedure Laterality Date   ABDOMINAL HYSTERECTOMY     BACK SURGERY     GANGLION CYST EXCISION      Social History:  Social History   Socioeconomic History   Marital status: Widowed    Spouse name: Not on file   Number of children: Not on file   Years of education: Not on file   Highest education level: Not on file  Occupational History   Not on file  Tobacco Use   Smoking status: Never   Smokeless tobacco: Never  Vaping Use   Vaping status: Never Used  Substance and Sexual Activity   Alcohol use: No   Drug use: No   Sexual activity: Not Currently  Other Topics Concern   Not on file  Social History Narrative   Not on file   Social Determinants of Health   Financial Resource Strain: Low Risk  (10/27/2021)   Overall Financial Resource Strain (CARDIA)    Difficulty of Paying Living Expenses: Not hard at all  Food Insecurity: No Food Insecurity (10/27/2021)   Hunger Vital Sign    Worried About Running Out of Food in the Last Year: Never true    Ran Out of Food in the Last Year: Never true  Transportation Needs: No Transportation Needs (10/27/2021)   PRAPARE - Administrator, Civil Service (Medical): No    Lack of Transportation (Non-Medical): No  Physical Activity: Sufficiently  Active (10/27/2021)   Exercise Vital Sign    Days of Exercise per Week: 7 days    Minutes of Exercise per Session: 60 min  Stress: No Stress Concern Present (10/27/2021)   [employer] of Occupational Health - Occupational Stress Questionnaire    Feeling of Stress : Not at all  Social Connections: Moderately Isolated (10/27/2021)   Social Connection and Isolation Panel [NHANES]    Frequency of Communication with Friends and Family: More than three times a week    Frequency of Social Gatherings with Friends and Family: More than three times a week    Attends Religious Services: More than 4 times per year    Active Member of [employer] or Organizations: No    Attends Banker Meetings: Never    Marital Status: Widowed  Intimate Partner Violence: Not At Risk (10/27/2021)   Humiliation, Afraid, Rape, and Kick questionnaire    Fear of Current or Ex-Partner: No    Emotionally Abused: No    Physically Abused: No    Sexually Abused: No    Family History:  Family History  Problem Relation Age of Onset   Heart attack Father    Diabetes Sister    Thyroid disease Daughter     Medications:   Current Outpatient Medications on File Prior to Visit  Medication Sig Dispense Refill   albuterol (VENTOLIN HFA) 108 (90 Base) MCG/ACT inhaler INHALE 2 PUFFS INTO THE LUNGS EVERY 6 HOURS AS NEEDED FOR WHEEZING OR SHORTNESS OF BREATH (Patient not taking: Reported on 10/25/2022) 6.7 g 1   atorvastatin (LIPITOR) 40 MG tablet TAKE 1 TABLET(40 MG) BY MOUTH DAILY 90 tablet 1   clopidogrel (PLAVIX) 75 MG tablet Take 1 tablet (75 mg total) by mouth daily. 90 tablet 0   fluticasone (FLONASE) 50 MCG/ACT nasal spray SHAKE LIQUID AND USE 1 SPRAY IN EACH NOSTRIL TWICE DAILY AS NEEDED 18.2 g 3   hydrOXYzine (ATARAX) 25 MG tablet Take 25 mg by mouth 4 (four) times daily. (Patient not taking: Reported on 10/25/2022)     pantoprazole (PROTONIX) 40 MG tablet Take 40 mg by mouth daily. (Patient not taking: Reported on  10/25/2022)     promethazine (PHENERGAN) 12.5 MG tablet Take 1 tablet (12.5 mg total) by mouth every 8 (eight) hours as needed for up to 10 doses for nausea or vomiting. (Patient not taking: Reported on 10/25/2022) 10 tablet 0   No current facility-administered medications on file prior to visit.    Allergies:   Allergies  Allergen Reactions   Codeine Itching   Hydrocodone Itching   Hydrocortisone     itching   Propoxyphene Itching and Nausea Only    itching      OBJECTIVE:  Physical Exam  There were no vitals filed for this visit.   There is no height or weight on file to calculate BMI. No results found.  General: well developed, well nourished, very pleasant elderly African-American female, seated, in no evident distress Head: head normocephalic and atraumatic.   Neck: supple with no carotid or supraclavicular bruits Cardiovascular: regular rate and rhythm, no murmurs Musculoskeletal: no deformity Skin:  no rash/petichiae Vascular:  Normal pulses all extremities   Neurologic Exam Mental Status: Awake and fully alert.  Fluent speech and language.  Oriented to place and time. Recent and remote memory intact. Attention span, concentration and fund of knowledge appropriate. Mood and affect appropriate.  Cranial Nerves: Pupils equal, briskly reactive to light. Extraocular movements full without nystagmus. Visual fields full to confrontation. Hearing intact. Facial sensation intact.  Very slight right lower facial weakness.  Tongue, palate moves normally and symmetrically.  Motor: Normal strength, bulk and tone left upper and lower extremity.  RUE: 4+/5 deltoid, 4/5 bicep and tricep, 3+/5 grip strength; limited shoulder ROM RLE: 4/5 hip flexor, knee extension and flexion, 3/5 ADF with AFO brace in place Sensory.: intact to touch , pinprick , position and vibratory sensation.  Coordination: Rapid alternating movements normal on left side. Finger-to-nose and heel-to-shin performed  accurately on left side. Gait and Station: Arises from chair without difficulty. Stance is normal. Gait demonstrates hemiplegic gait with decreased RLE step height and use of cane. Tandem walk and heel-toe not attempted  Reflexes: 1+ and symmetric. Toes downgoing.         ASSESSMENT: Tessa Laury is a 77 y.o. 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 Possible TIA on 06/19/2021 after presenting with left facial weakness lasting approximately 1 hour with imaging unremarkable.  Vascular risk factors include HLD, advanced age,  obesity, migraines, DM.      PLAN:  Left pontine stroke: TIA:  Residual deficit: Right hemiparesis and gait impairment.  Encouraged continued exercises as advised during therapy sessions. Continued use of cane at all times unless otherwise advised.  Continue Plavix 75 mg daily and atorvastatin 40 mg daily for secondary stroke prevention.   Discussed secondary stroke prevention measures and importance of close PCP follow up for aggressive stroke risk factor management including BP goal<130/90, HLD with LDL goal<70 and DM with A1c.<7 I have gone over the pathophysiology of stroke, warning signs and symptoms, risk factors and their management in some detail with instructions to go to the closest emergency room for symptoms of concern.    Doing well from stroke standpoint without further recommendations and risk factors are managed by PCP. She may follow up PRN, as usual for our patients who are strictly being followed for stroke. If any new neurological issues should arise, request PCP place referral for evaluation by one of our neurologists. Thank you.     CC:  PCP: Georganna Skeans, MD    I spent 31 minutes of face-to-face and non-face-to-face time with patient.  This included previsit chart review including review of recent ER admission, lab review, study  review, electronic health record documentation, patient and daughter education and discussion regarding above diagnoses and treatment plan and answered all other questions to patients satisfaction   Ihor Austin, [facility]  [facility] [address] [place], [place] [address]  Phone [phone] Fax [phone] Note: This document was prepared with digital dictation and possible smart phrase technology. Any transcriptional errors that result from this process are unintentional.

## 2023-04-18 ENCOUNTER — Ambulatory Visit: Payer: 59 | Admitting: Adult Health

## 2023-04-26 ENCOUNTER — Other Ambulatory Visit: Payer: 59

## 2023-04-30 ENCOUNTER — Encounter: Payer: Self-pay | Admitting: Family Medicine

## 2023-04-30 ENCOUNTER — Ambulatory Visit (INDEPENDENT_AMBULATORY_CARE_PROVIDER_SITE_OTHER): Payer: 59 | Admitting: Family Medicine

## 2023-04-30 VITALS — BP 167/93 | HR 90 | Temp 98.6°F | Resp 16 | Ht 63.0 in | Wt 212.0 lb

## 2023-04-30 DIAGNOSIS — E7849 Other hyperlipidemia: Secondary | ICD-10-CM

## 2023-04-30 DIAGNOSIS — I10 Essential (primary) hypertension: Secondary | ICD-10-CM | POA: Diagnosis not present

## 2023-04-30 MED ORDER — CLOPIDOGREL BISULFATE 75 MG PO TABS: 75.0000 mg | ORAL_TABLET | Freq: Every day | ORAL | 3 refills | Status: DC

## 2023-04-30 MED ORDER — LISINOPRIL 10 MG PO TABS
10.0000 mg | ORAL_TABLET | Freq: Every day | ORAL | 0 refills | Status: DC
Start: 2023-04-30 — End: 2023-05-09

## 2023-04-30 NOTE — Progress Notes (Signed)
Established Patient Office Visit  Subjective    Patient ID: Carrie Kline, female    DOB: April 06, 1946  Age: 77 y.o. MRN: [id number]  CC:  Chief Complaint  Patient presents with   Follow-up    Neck and head pain, mucus comes out of mouth     HPI Carrie Kline presents for follow up of chronic med issues. Patient denies acute complaints.    Outpatient Encounter Medications as of 04/30/2023  Medication Sig   albuterol (VENTOLIN HFA) 108 (90 Base) MCG/ACT inhaler INHALE 2 PUFFS INTO THE LUNGS EVERY 6 HOURS AS NEEDED FOR WHEEZING OR SHORTNESS OF BREATH   atorvastatin (LIPITOR) 40 MG tablet TAKE 1 TABLET(40 MG) BY MOUTH DAILY   fluticasone (FLONASE) 50 MCG/ACT nasal spray SHAKE LIQUID AND USE 1 SPRAY IN EACH NOSTRIL TWICE DAILY AS NEEDED   hydrOXYzine (ATARAX) 25 MG tablet Take 25 mg by mouth 4 (four) times daily.   lisinopril (ZESTRIL) 10 MG tablet Take 1 tablet (10 mg total) by mouth daily.   pantoprazole (PROTONIX) 40 MG tablet Take 40 mg by mouth daily.   promethazine (PHENERGAN) 12.5 MG tablet Take 1 tablet (12.5 mg total) by mouth every 8 (eight) hours as needed for up to 10 doses for nausea or vomiting.   [DISCONTINUED] clopidogrel (PLAVIX) 75 MG tablet Take 1 tablet (75 mg total) by mouth daily.   clopidogrel (PLAVIX) 75 MG tablet Take 1 tablet (75 mg total) by mouth daily.   No facility-administered encounter medications on file as of 04/30/2023.    Past Medical History:  Diagnosis Date   Gout    Seasonal allergies    Stroke [facility])     Past Surgical History:  Procedure Laterality Date   ABDOMINAL HYSTERECTOMY     BACK SURGERY     GANGLION CYST EXCISION      Family History  Problem Relation Age of Onset   Heart attack Father    Diabetes Sister    Thyroid disease Daughter     Social History   Socioeconomic History   Marital status: Widowed    Spouse name: Not on file   Number of children: Not on file   Years of education: Not on file   Highest education level:  Not on file  Occupational History   Not on file  Tobacco Use   Smoking status: Never   Smokeless tobacco: Never  Vaping Use   Vaping status: Never Used  Substance and Sexual Activity   Alcohol use: No   Drug use: No   Sexual activity: Not Currently  Other Topics Concern   Not on file  Social History Narrative   Not on file   Social Determinants of Health   Financial Resource Strain: Low Risk  (10/27/2021)   Overall Financial Resource Strain (CARDIA)    Difficulty of Paying Living Expenses: Not hard at all  Food Insecurity: No Food Insecurity (10/27/2021)   Hunger Vital Sign    Worried About Running Out of Food in the Last Year: Never true    Ran Out of Food in the Last Year: Never true  Transportation Needs: No Transportation Needs (10/27/2021)   PRAPARE - Administrator, Civil Service (Medical): No    Lack of Transportation (Non-Medical): No  Physical Activity: Sufficiently Active (10/27/2021)   Exercise Vital Sign    Days of Exercise per Week: 7 days    Minutes of Exercise per Session: 60 min  Stress: No Stress Concern Present (10/27/2021)  [employer] of Occupational Health - Occupational Stress Questionnaire    Feeling of Stress : Not at all  Social Connections: Moderately Isolated (10/27/2021)   Social Connection and Isolation Panel [NHANES]    Frequency of Communication with Friends and Family: More than three times a week    Frequency of Social Gatherings with Friends and Family: More than three times a week    Attends Religious Services: More than 4 times per year    Active Member of [employer] or Organizations: No    Attends Banker Meetings: Never    Marital Status: Widowed  Intimate Partner Violence: Not At Risk (10/27/2021)   Humiliation, Afraid, Rape, and Kick questionnaire    Fear of Current or Ex-Partner: No    Emotionally Abused: No    Physically Abused: No    Sexually Abused: No    Review of Systems  All other systems reviewed and are  negative.       Objective    BP (!) 167/93 (BP Location: Left Arm, Patient Position: Sitting, Cuff Size: Large)   Pulse 90   Temp 98.6 F (37 C) (Oral)   Resp 16   Ht 5\' 3"  (1.6 m)   Wt 212 lb (96.2 kg)   SpO2 97%   BMI 37.55 kg/m   Physical Exam Vitals and nursing note reviewed.  Constitutional:      General: She is not in acute distress. Cardiovascular:     Rate and Rhythm: Normal rate and regular rhythm.  Pulmonary:     Effort: Pulmonary effort is normal.     Breath sounds: Normal breath sounds.  Abdominal:     Palpations: Abdomen is soft.     Tenderness: There is no abdominal tenderness.  Musculoskeletal:     Comments: Utilizing cane for stability  Neurological:     General: No focal deficit present.     Mental Status: She is alert and oriented to person, place, and time.         Assessment & Plan:   1. Essential hypertension Elevated readings. Will prescribe lisinopril 10 mg daily and monitor  2. Other hyperlipidemia continue    Return in about 2 weeks (around 05/14/2023).   Tommie Raymond, MD

## 2023-04-30 NOTE — Progress Notes (Signed)
Follow up , neck and head pain, mucus comes out of mouth

## 2023-05-09 ENCOUNTER — Encounter: Payer: Self-pay | Admitting: Family Medicine

## 2023-05-09 ENCOUNTER — Ambulatory Visit (INDEPENDENT_AMBULATORY_CARE_PROVIDER_SITE_OTHER): Payer: 59 | Admitting: Family Medicine

## 2023-05-09 VITALS — BP 127/82 | HR 94 | Temp 98.2°F | Resp 18 | Ht 63.0 in | Wt 210.6 lb

## 2023-05-09 DIAGNOSIS — Z7984 Long term (current) use of oral hypoglycemic drugs: Secondary | ICD-10-CM | POA: Diagnosis not present

## 2023-05-09 DIAGNOSIS — E119 Type 2 diabetes mellitus without complications: Secondary | ICD-10-CM

## 2023-05-09 DIAGNOSIS — E1159 Type 2 diabetes mellitus with other circulatory complications: Secondary | ICD-10-CM | POA: Diagnosis not present

## 2023-05-09 DIAGNOSIS — I10 Essential (primary) hypertension: Secondary | ICD-10-CM

## 2023-05-09 DIAGNOSIS — E7849 Other hyperlipidemia: Secondary | ICD-10-CM | POA: Diagnosis not present

## 2023-05-09 DIAGNOSIS — Z6837 Body mass index (BMI) 37.0-37.9, adult: Secondary | ICD-10-CM

## 2023-05-09 LAB — POCT GLYCOSYLATED HEMOGLOBIN (HGB A1C): Hemoglobin A1C: 8.8 % — AB (ref 4.0–5.6)

## 2023-05-09 MED ORDER — ATORVASTATIN CALCIUM 40 MG PO TABS: 40.0000 mg | ORAL_TABLET | Freq: Every day | ORAL | 1 refills | Status: DC

## 2023-05-09 MED ORDER — LISINOPRIL 10 MG PO TABS: 10.0000 mg | ORAL_TABLET | Freq: Every day | ORAL | 1 refills | Status: DC

## 2023-05-09 NOTE — Progress Notes (Signed)
Follow up b/p check

## 2023-05-10 ENCOUNTER — Encounter: Payer: Self-pay | Admitting: Family Medicine

## 2023-05-10 NOTE — Progress Notes (Unsigned)
Established Patient Office Visit  Subjective    Patient ID: Carrie Kline, female    DOB: Feb 10, 1946  Age: 77 y.o. MRN: [id number]  CC:  Chief Complaint  Patient presents with   Follow-up    B/p check    HPI Carrie Kline presents for follow up of hypertension and other chronic med issues.   Outpatient Encounter Medications as of 05/09/2023  Medication Sig   albuterol (VENTOLIN HFA) 108 (90 Base) MCG/ACT inhaler INHALE 2 PUFFS INTO THE LUNGS EVERY 6 HOURS AS NEEDED FOR WHEEZING OR SHORTNESS OF BREATH   clopidogrel (PLAVIX) 75 MG tablet Take 1 tablet (75 mg total) by mouth daily.   fluticasone (FLONASE) 50 MCG/ACT nasal spray SHAKE LIQUID AND USE 1 SPRAY IN EACH NOSTRIL TWICE DAILY AS NEEDED   hydrOXYzine (ATARAX) 25 MG tablet Take 25 mg by mouth 4 (four) times daily.   ketorolac (ACULAR) 0.5 % ophthalmic solution SMARTSIG:In Eye(s)   ofloxacin (OCUFLOX) 0.3 % ophthalmic solution    pantoprazole (PROTONIX) 40 MG tablet Take 40 mg by mouth daily.   prednisoLONE acetate (PRED FORTE) 1 % ophthalmic suspension SMARTSIG:In Eye(s)   promethazine (PHENERGAN) 12.5 MG tablet Take 1 tablet (12.5 mg total) by mouth every 8 (eight) hours as needed for up to 10 doses for nausea or vomiting.   [DISCONTINUED] atorvastatin (LIPITOR) 40 MG tablet TAKE 1 TABLET(40 MG) BY MOUTH DAILY   [DISCONTINUED] lisinopril (ZESTRIL) 10 MG tablet Take 1 tablet (10 mg total) by mouth daily.   atorvastatin (LIPITOR) 40 MG tablet Take 1 tablet (40 mg total) by mouth daily.   lisinopril (ZESTRIL) 10 MG tablet Take 1 tablet (10 mg total) by mouth daily.   No facility-administered encounter medications on file as of 05/09/2023.    Past Medical History:  Diagnosis Date   Gout    Seasonal allergies    Stroke [facility])     Past Surgical History:  Procedure Laterality Date   ABDOMINAL HYSTERECTOMY     BACK SURGERY     GANGLION CYST EXCISION      Family History  Problem Relation Age of Onset   Heart attack Father     Diabetes Sister    Thyroid disease Daughter     Social History   Socioeconomic History   Marital status: Widowed    Spouse name: Not on file   Number of children: Not on file   Years of education: Not on file   Highest education level: Not on file  Occupational History   Not on file  Tobacco Use   Smoking status: Never   Smokeless tobacco: Never  Vaping Use   Vaping status: Never Used  Substance and Sexual Activity   Alcohol use: No   Drug use: No   Sexual activity: Not Currently  Other Topics Concern   Not on file  Social History Narrative   Not on file   Social Determinants of Health   Financial Resource Strain: Low Risk  (10/27/2021)   Overall Financial Resource Strain (CARDIA)    Difficulty of Paying Living Expenses: Not hard at all  Food Insecurity: No Food Insecurity (10/27/2021)   Hunger Vital Sign    Worried About Running Out of Food in the Last Year: Never true    Ran Out of Food in the Last Year: Never true  Transportation Needs: No Transportation Needs (10/27/2021)   PRAPARE - Administrator, Civil Service (Medical): No    Lack of Transportation (Non-Medical): No  Physical  Activity: Sufficiently Active (10/27/2021)   Exercise Vital Sign    Days of Exercise per Week: 7 days    Minutes of Exercise per Session: 60 min  Stress: No Stress Concern Present (05/09/2023)   [employer] of Occupational Health - Occupational Stress Questionnaire    Feeling of Stress : Not at all  Social Connections: Moderately Integrated (05/09/2023)   Social Connection and Isolation Panel [NHANES]    Frequency of Communication with Friends and Family: More than three times a week    Frequency of Social Gatherings with Friends and Family: More than three times a week    Attends Religious Services: More than 4 times per year    Active Member of [employer] or Organizations: Yes    Attends Banker Meetings: More than 4 times per year    Marital Status: Widowed   Intimate Partner Violence: Not At Risk (10/27/2021)   Humiliation, Afraid, Rape, and Kick questionnaire    Fear of Current or Ex-Partner: No    Emotionally Abused: No    Physically Abused: No    Sexually Abused: No    Review of Systems  All other systems reviewed and are negative.       Objective    BP 127/82 (BP Location: Right Arm, Patient Position: Sitting, Cuff Size: Large)   Pulse 94   Temp 98.2 F (36.8 C) (Oral)   Resp 18   Ht 5\' 3"  (1.6 m)   Wt 210 lb 9.6 oz (95.5 kg)   SpO2 97%   BMI 37.31 kg/m   Physical Exam Vitals and nursing note reviewed.  Constitutional:      General: She is not in acute distress. Cardiovascular:     Rate and Rhythm: Normal rate and regular rhythm.  Pulmonary:     Effort: Pulmonary effort is normal.     Breath sounds: Normal breath sounds.  Abdominal:     Palpations: Abdomen is soft.     Tenderness: There is no abdominal tenderness.  Musculoskeletal:     Comments: Utilizing cane for stability  Neurological:     General: No focal deficit present.     Mental Status: She is alert and oriented to person, place, and time.     {Labs (Optional):23779}    Assessment & Plan:   Type 2 diabetes mellitus with other circulatory complication, without long-term current use of insulin (HCC) -     POCT glycosylated hemoglobin (Hb A1C)  Essential hypertension  Other hyperlipidemia  Class 2 severe obesity due to excess calories with serious comorbidity and body mass index (BMI) of 37.0 to 37.9 in adult [facility])  Diabetes mellitus treated with oral medication (HCC)  Other orders -     Lisinopril; Take 1 tablet (10 mg total) by mouth daily.  Dispense: 90 tablet; Refill: 1 -     Atorvastatin Calcium; Take 1 tablet (40 mg total) by mouth daily.  Dispense: 90 tablet; Refill: 1     Return in about 3 months (around 08/08/2023) for follow up.   Tommie Raymond, MD

## 2023-05-14 ENCOUNTER — Ambulatory Visit: Payer: 59 | Admitting: Adult Health

## 2023-05-16 ENCOUNTER — Encounter: Payer: Self-pay | Admitting: Pharmacist

## 2023-05-16 ENCOUNTER — Ambulatory Visit: Payer: 59 | Attending: Family Medicine | Admitting: Pharmacist

## 2023-05-16 DIAGNOSIS — E1159 Type 2 diabetes mellitus with other circulatory complications: Secondary | ICD-10-CM | POA: Diagnosis not present

## 2023-05-16 DIAGNOSIS — Z7984 Long term (current) use of oral hypoglycemic drugs: Secondary | ICD-10-CM | POA: Diagnosis not present

## 2023-05-16 LAB — POCT GLYCOSYLATED HEMOGLOBIN (HGB A1C): HbA1c, POC (controlled diabetic range): 8.6 % — AB (ref 0.0–7.0)

## 2023-05-16 MED ORDER — METFORMIN HCL ER 500 MG PO TB24
500.0000 mg | ORAL_TABLET | Freq: Every day | ORAL | 1 refills | Status: DC
Start: 2023-05-16 — End: 2023-08-08

## 2023-05-16 NOTE — Progress Notes (Signed)
S:    77 y.o. female who presents for diabetes evaluation, education, and management in the context of the LIBERATE study. PMH is significant for DM, stoke (June 2022), gout, and HTN.   Patient was referred and last seen by Primary Care Provider, Dr. Andrey Campanile, on 05/09/2023. At visit, A1c was 8.8% and no diabetic medications were initiated.   Today, patient arrives in good spirits and presents without any assistance. Patient is accompanied by daughter and granddaughter. Patient reports starting metformin in June 2022 following stroke. Patient experienced GI adverse effects with metformin and was switched to glipizide. A few weeks after, doctor discontinued medications. Patient never formally received diabetes medication until recent A1c check.  Family/Social History:  -FHx: Heart attack (father), diabetes (sister), thyroid disease (daughter)  Current diabetes medications include: none - Past diabetic medications include: metformin, glipizide - taken off bc not diabetic and  Current hypertension medications include: lisinopril 10 mg Current hyperlipidemia medications include: atorvastatin 40 mg  Insurance coverage: Medicare and Medicaid  Patient denies hypoglycemic events.  Patient reports nocturia (nighttime urination).  Patient denies neuropathy (nerve pain). Patient denies visual changes. Patient reports self foot exams.   Patient reported dietary habits: Eats 2 meals/day Lunch: sandwich, cheese and crackers, grilled cheese Dinner: hamburger and gravy with green beans, broccoli and potatoes and chicken, pinto beans and turnip greens and [place] Snacks: Lance peanut butter crackers, sugar-free wafers, peanuts, fruits (grapes, oranges) Drinks: water mainly, lemonade, zero-sugar Twist,   Patient-reported exercise habits:  - walking from mailbox to porch fives a day  - exercises with cane   O:   Lab Results  Component Value Date   HGBA1C 8.6 (A) 05/16/2023   There were no  vitals filed for this visit.  Lipid Panel     Component Value Date/Time   CHOL 116 10/25/2022 1515   TRIG 120 10/25/2022 1515   HDL 37 (L) 10/25/2022 1515   CHOLHDL 3.1 10/25/2022 1515   CHOLHDL 5.0 02/24/2021 1203   VLDL 16 02/24/2021 1203   LDLCALC 57 10/25/2022 1515    Clinical Atherosclerotic Cardiovascular Disease (ASCVD): Yes  The ASCVD Risk score (Arnett DK, et al., 2019) failed to calculate for the following reasons:   The patient has a prior MI or stroke diagnosis   Patient is participating in a Managed Medicaid Plan: No   A/P:  LIBERATE Study:  -Patient provided verbal consent to participate in the study. Consent documented in electronic medical record.  -Provided education on Libre 3 CGM. Collaborated to ensure Josephine Igo 3 app was downloaded on patient's phone. Educated on how to place sensor every 14 days, patient placed first sensor correctly and verbalized understanding of use, removal, and how to place next sensor. Discussed alarms. 8 sensors provided for a 3 month supply. Educated to contact the office if the sensor falls off early and replacements are needed before their next [employer].   Diabetes newly diagnosed, currently uncontrolled based on most recent A1c. Patient is able to verbalize appropriate hypoglycemia management plan. Medication adherence appears okay. Control is suboptimal due to need for medication initiation and management. Patient consented to be enrolled in LIBERATE study. Patient is willing to retrial metformin at a low dose in the XR formulation as medication seems to be most appropriate to lower A1c.  -Start metformin XR 500 mg once daily with dinner (largest meal). F/u with pharmacist if experiencing GI effects  -FreeStyle Libre3 applied and started today -Patient educated on purpose, proper use, and potential adverse  effects of metformin.  -Extensively discussed pathophysiology of diabetes, recommended lifestyle interventions, dietary effects  on blood sugar control.  -Counseled on s/sx of and management of hypoglycemia.  -Next A1c anticipated 07/2023  Written patient instructions provided. Patient verbalized understanding of treatment plan.  Total time in face to face counseling 30 minutes.    Follow-up:  Pharmacist on 06/18/2023 PCP clinic visit on 08/08/2023   Roslyn Smiling, PharmD PGY1 Pharmacy Resident 05/16/2023 4:55 PM

## 2023-05-23 ENCOUNTER — Telehealth: Payer: Self-pay | Admitting: Pharmacist

## 2023-05-23 NOTE — Telephone Encounter (Signed)
LIBERATE Study  Patient completed first study visit for the LIBERATE CGM Study. Contacted patient to discuss CGM tolerability. Confirmed HIPAA identifiers.   Patient confirms Josephine Igo 3 sensors is working and glucose values are transmitting appropriately. Denies any questions or concerns.   - Confirmed second visit with me 06/18/2023. Confirmed patient has transportation to this appointment.  - Discussed use of MyChart in this study. Confirmed patient has an active MyChart account and is aware of their log in information.  Patient aware of pre-visit questionnaire that will be sent 2 days prior to their scheduled study visit and they will plan to complete before the visit.  Butch Penny, PharmD, Patsy [place], CPP Clinical Pharmacist [facility] & [facility] [phone]

## 2023-06-18 ENCOUNTER — Encounter: Payer: Self-pay | Admitting: Pharmacist

## 2023-06-18 ENCOUNTER — Ambulatory Visit: Payer: 59 | Attending: Family Medicine | Admitting: Pharmacist

## 2023-06-18 DIAGNOSIS — Z7984 Long term (current) use of oral hypoglycemic drugs: Secondary | ICD-10-CM | POA: Diagnosis not present

## 2023-06-18 DIAGNOSIS — E1159 Type 2 diabetes mellitus with other circulatory complications: Secondary | ICD-10-CM

## 2023-06-18 NOTE — Progress Notes (Signed)
S:    77 y.o. female who presents for diabetes evaluation, education, and management. PMH is significant for DM, stoke (June 2022), gout, and HTN.   Patient was referred and last seen by Primary Care Provider, Dr. Andrey Campanile, on 05/09/2023. At visit, A1c was 8.8% and no diabetic medications were initiated. Pharmacy saw her on 05/16/2023 and enrolled her in LIBERATE. She is due for study visit #2 in December. Of note, we started metformin XR at her visit with [place] on 05/16/2023.  Today, patient arrives in good spirits and presents without any assistance. Patient is accompanied by daughter. She is doing well since last visit. CGM data looks tremendous! Summarized below. She had daily GI upset (diarrhea) for the first week of metformin 500 mg XR daily but this has since reduced to 1-2x weekly. She does not wish to stop at this time. Overall, she feels better with less nocturia and improved energy. She has also incorporated drastic diet change into her daily lifestyle.   Family/Social History:  -FHx: Heart attack (father), diabetes (sister), thyroid disease (daughter)  Current diabetes medications include: metformin 500 mg XR daily  Current hypertension medications include: lisinopril 10 mg Current hyperlipidemia medications include: atorvastatin 40 mg  Insurance coverage: Medicare and Medicaid  Patient denies hypoglycemic events.  Patient denies nocturia. Patient denies neuropathy (nerve pain). Patient denies visual changes. Patient reports self foot exams.   Patient reported dietary habits: Eats 2 meals/day Lunch: sandwich, cheese and crackers, grilled cheese Dinner: hamburger and gravy with green beans, broccoli and potatoes and chicken, pinto beans and turnip greens and [place] Snacks: Lance peanut butter crackers, sugar-free wafers, peanuts, fruits (grapes, oranges) Drinks: water mainly, lemonade, zero-sugar Twist,   Patient-reported exercise habits:  - walking from mailbox to porch fives  a day  - exercises with cane   O:  Date of Download: 10/9 - 06/18/2023 % Time CGM is active: 98% Average Glucose: 121 mg/dL Glucose Management Indicator: 6.2  Glucose Variability: 23.1 (goal <36%) Time in Goal:  - Time in range 70-180: 96% - Time above range: 4% - Time below range: 0% Observed patterns:  Lab Results  Component Value Date   HGBA1C 8.6 (A) 05/16/2023   There were no vitals filed for this visit.  Lipid Panel     Component Value Date/Time   CHOL 116 10/25/2022 1515   TRIG 120 10/25/2022 1515   HDL 37 (L) 10/25/2022 1515   CHOLHDL 3.1 10/25/2022 1515   CHOLHDL 5.0 02/24/2021 1203   VLDL 16 02/24/2021 1203   LDLCALC 57 10/25/2022 1515    Clinical Atherosclerotic Cardiovascular Disease (ASCVD): Yes  The ASCVD Risk score (Arnett DK, et al., 2019) failed to calculate for the following reasons:   The patient has a prior MI or stroke diagnosis   Patient is participating in a Managed Medicaid Plan: No   A/P: Diabetes newly diagnosed, currently uncontrolled based on most recent A1c. However, CGM data since last visit looks fantastic. Commended her for this. Patient is able to verbalize appropriate hypoglycemia management plan. Medication adherence appears to be optimal.  -Contin ue metformin XR 500 mg once daily with dinner (largest meal).  -Patient educated on purpose, proper use, and potential adverse effects of metformin.  -Extensively discussed pathophysiology of diabetes, recommended lifestyle interventions, dietary effects on blood sugar control.  -Counseled on s/sx of and management of hypoglycemia.  -Next A1c anticipated 07/2023 -Provided with more sensors.   Written patient instructions provided. Patient verbalized understanding of treatment plan.  Total time in face to face counseling 30 minutes.    Follow-up:  Me in December for LIBERATE #2.  PCP clinic visit on 08/08/2023   Butch Penny, PharmD, BCACP, CPP Clinical Pharmacist [facility] & [facility] [phone]

## 2023-07-30 ENCOUNTER — Encounter: Payer: Self-pay | Admitting: Pharmacist

## 2023-07-30 ENCOUNTER — Ambulatory Visit: Payer: 59 | Attending: Family Medicine | Admitting: Pharmacist

## 2023-07-30 DIAGNOSIS — E1159 Type 2 diabetes mellitus with other circulatory complications: Secondary | ICD-10-CM | POA: Diagnosis not present

## 2023-07-30 DIAGNOSIS — Z7984 Long term (current) use of oral hypoglycemic drugs: Secondary | ICD-10-CM

## 2023-07-30 LAB — POCT GLYCOSYLATED HEMOGLOBIN (HGB A1C): HbA1c, POC (controlled diabetic range): 6.4 % (ref 0.0–7.0)

## 2023-07-30 NOTE — Progress Notes (Signed)
S:     No chief complaint on file.  77 y.o. female who presents for diabetes evaluation, education, and management in the context of the LIBERATE Study. We enrolled her on 05/16/2023. PMH is significant for T2DM, stoke (June 2022), gout, and HTN.   I last saw her on 06/18/2023. CGM report looked excellent at that visit. She is here today for an updated A1c and I have summarized her CGM report below.   Today, patient arrives in good spirits and presents with her daughter. She has no complaints today.   Family/Social History:  Fhx: MI, DM, thyroid disease Tobacco: never smoker  Alcohol: none reported   Current diabetes medications include: metformin 500 mg XR daily Patient reports adherence to taking all medications as prescribed.   Insurance coverage: [employer]  Patient denies hypoglycemic events.  CGM Study Study visit: 3 Month Follow Up Visit  CGM Data Download date: 9/5 to 07/30/2023 % Time CGM Is Active: 78 % Average glucose (mg/dL): 956 mg/dL Glucose Management Indicator (%): 6.3 % Glucose Variability (%): 22.1 % Time Above Range >180 mg/dL (%): 5 % Time in Range 70-180 mg/dL (%): 95 % Time Below Range <70 mg/dL (%): 0 %  Diabetes Distress Scale Feeling like diabetes is taking up too much of my mental and physical energy every day.: Not a problem Feeling that my doctor doesn't know enough about diabetes and diabetic care. : Not a problem Feeling angry, scared, and/or depressed when I think about living with diabetes : Not a problem Feeling that my doctor doesn't give my clear directions on how to manage my diabetes. : Not a problem Feeling that im not testing my blood sugars frequently enough.: Not a problem Feeling that I'm often failing with my diabetes routine: Not a problem Feelling that friends and family are not supportive enough of self care efforts.: Not a problem Feeling that diabetes controls my life.: Not a problem Feeling that my doctor doesn't  take my concerns seriously enough: Not a problem Not feeling confident in my day to day ability to manage diabetes: Not a problem Feeling that I will end up with serious long term complications no matter what I do.: Not a problem Feeling that I am not sticking closely enough to a good meal plan.: Not a problem Feeling that friends or family don't appreciate how difficult living with diabetes can be. : Not a problem Feeling overwhelmed by the demands of living with diabetes.: Not a problem Feeling that I don't have a doctor who I can see.: Not a problem Not feeling motivated to keep up my diabetes self management.: Not a problem Feeling that friends or family don't give me the emotional support that I would like. : Not a problem DDS17 Score: 17 Emotional Burden Score: 1 Physician related distress score: 1 Regimen Related Distress score : 1 Interpersonal distress score: 1    Patient denies nocturia (nighttime urination).  Patient denies neuropathy (nerve pain). Patient denies visual changes. Patient reports self foot exams.   Patient reported dietary habits:  -Major improvement in diet. Specifically, her daughters have helped her cut out sweets.   Patient-reported exercise habits:  -walking from mailbox to porch fives a day  - exercises with cane   O:  Lab Results  Component Value Date   HGBA1C 6.4 07/30/2023    There were no vitals filed for this visit.   Lipid Panel     Component Value Date/Time   CHOL 116 10/25/2022 1515  TRIG 120 10/25/2022 1515   HDL 37 (L) 10/25/2022 1515   CHOLHDL 3.1 10/25/2022 1515   CHOLHDL 5.0 02/24/2021 1203   VLDL 16 02/24/2021 1203   LDLCALC 57 10/25/2022 1515    Clinical Atherosclerotic Cardiovascular Disease (ASCVD): Yes  The ASCVD Risk score (Arnett DK, et al., 2019) failed to calculate for the following reasons:   The patient has a prior MI or stroke diagnosis   Patient is participating in a Managed Medicaid Plan: No      A/P:  LIBERATE Study:  - 8 sensors provided for a 3 month supply. Educated to contact the office if the sensor falls off early and replacements are needed before their next [employer].   Diabetes longstanding currently controlled! Commended her for her improvement! Patient is able to verbalize appropriate hypoglycemia management plan. Medication adherence appears to be optimal. -Continued metformin 500 mg XR daily for now. If her A1c remains at goal at her 16-month follow-up, we can consider stopping metformin and managing with lifestyle.  -Patient educated on purpose, proper use, and potential adverse effects of metformin.  -Extensively discussed pathophysiology of diabetes, recommended lifestyle interventions, dietary effects on blood sugar control.  -Counseled on s/sx of and management of hypoglycemia.  -Next A1c anticipated 10/2023.   Written patient instructions provided. Patient verbalized understanding of treatment plan.  Total time in face to face counseling 30 minutes.    Follow-up:  Pharmacist in 3 months. PCP clinic visit later this month.   Butch Penny, PharmD, Patsy Baltimore, CPP Clinical Pharmacist [facility] [phone]

## 2023-08-08 ENCOUNTER — Encounter: Payer: Self-pay | Admitting: Family Medicine

## 2023-08-08 ENCOUNTER — Ambulatory Visit (INDEPENDENT_AMBULATORY_CARE_PROVIDER_SITE_OTHER): Payer: 59 | Admitting: Family Medicine

## 2023-08-08 VITALS — BP 134/79 | HR 97 | Temp 98.7°F | Resp 16 | Ht 63.0 in | Wt 204.6 lb

## 2023-08-08 DIAGNOSIS — E7849 Other hyperlipidemia: Secondary | ICD-10-CM | POA: Diagnosis not present

## 2023-08-08 DIAGNOSIS — Z6836 Body mass index (BMI) 36.0-36.9, adult: Secondary | ICD-10-CM

## 2023-08-08 DIAGNOSIS — I10 Essential (primary) hypertension: Secondary | ICD-10-CM | POA: Diagnosis not present

## 2023-08-08 DIAGNOSIS — E1159 Type 2 diabetes mellitus with other circulatory complications: Secondary | ICD-10-CM | POA: Diagnosis not present

## 2023-08-08 DIAGNOSIS — Z7984 Long term (current) use of oral hypoglycemic drugs: Secondary | ICD-10-CM | POA: Diagnosis not present

## 2023-08-08 DIAGNOSIS — K219 Gastro-esophageal reflux disease without esophagitis: Secondary | ICD-10-CM | POA: Diagnosis not present

## 2023-08-08 DIAGNOSIS — E66812 Obesity, class 2: Secondary | ICD-10-CM

## 2023-08-08 MED ORDER — PANTOPRAZOLE SODIUM 40 MG PO TBEC: 40.0000 mg | DELAYED_RELEASE_TABLET | Freq: Every day | ORAL | 1 refills | Status: DC

## 2023-08-08 MED ORDER — METFORMIN HCL ER 500 MG PO TB24: 500.0000 mg | ORAL_TABLET | Freq: Every day | ORAL | 1 refills | Status: DC

## 2023-08-08 MED ORDER — ATORVASTATIN CALCIUM 40 MG PO TABS: 40.0000 mg | ORAL_TABLET | Freq: Every day | ORAL | 1 refills | Status: DC

## 2023-08-12 ENCOUNTER — Encounter: Payer: Self-pay | Admitting: Family Medicine

## 2023-08-12 NOTE — Progress Notes (Signed)
Established Patient Office Visit  Subjective    Patient ID: Carrie Kline, female    DOB: Jan 06, 1946  Age: 77 y.o. MRN: [id number]  CC:  Chief Complaint  Patient presents with   Follow-up    3 month, sinus conjestion    HPI Carrie Kline presents for routine follow up of chronic med issues including diabetes and hypertension. Patient denies acute complaints or concerns.   Outpatient Encounter Medications as of 08/08/2023  Medication Sig   albuterol (VENTOLIN HFA) 108 (90 Base) MCG/ACT inhaler INHALE 2 PUFFS INTO THE LUNGS EVERY 6 HOURS AS NEEDED FOR WHEEZING OR SHORTNESS OF BREATH   clopidogrel (PLAVIX) 75 MG tablet Take 1 tablet (75 mg total) by mouth daily.   fluticasone (FLONASE) 50 MCG/ACT nasal spray SHAKE LIQUID AND USE 1 SPRAY IN EACH NOSTRIL TWICE DAILY AS NEEDED   hydrOXYzine (ATARAX) 25 MG tablet Take 25 mg by mouth 4 (four) times daily.   ketorolac (ACULAR) 0.5 % ophthalmic solution SMARTSIG:In Eye(s)   lisinopril (ZESTRIL) 10 MG tablet Take 1 tablet (10 mg total) by mouth daily.   ofloxacin (OCUFLOX) 0.3 % ophthalmic solution    prednisoLONE acetate (PRED FORTE) 1 % ophthalmic suspension SMARTSIG:In Eye(s)   promethazine (PHENERGAN) 12.5 MG tablet Take 1 tablet (12.5 mg total) by mouth every 8 (eight) hours as needed for up to 10 doses for nausea or vomiting.   [DISCONTINUED] atorvastatin (LIPITOR) 40 MG tablet Take 1 tablet (40 mg total) by mouth daily.   [DISCONTINUED] metFORMIN (GLUCOPHAGE-XR) 500 MG 24 hr tablet Take 1 tablet (500 mg total) by mouth daily.   [DISCONTINUED] pantoprazole (PROTONIX) 40 MG tablet Take 40 mg by mouth daily.   atorvastatin (LIPITOR) 40 MG tablet Take 1 tablet (40 mg total) by mouth daily.   metFORMIN (GLUCOPHAGE-XR) 500 MG 24 hr tablet Take 1 tablet (500 mg total) by mouth daily.   pantoprazole (PROTONIX) 40 MG tablet Take 1 tablet (40 mg total) by mouth daily.   No facility-administered encounter medications on file as of 08/08/2023.     Past Medical History:  Diagnosis Date   Gout    Seasonal allergies    Stroke [facility])     Past Surgical History:  Procedure Laterality Date   ABDOMINAL HYSTERECTOMY     BACK SURGERY     GANGLION CYST EXCISION      Family History  Problem Relation Age of Onset   Heart attack Father    Diabetes Sister    Thyroid disease Daughter     Social History   Socioeconomic History   Marital status: Widowed    Spouse name: Not on file   Number of children: Not on file   Years of education: Not on file   Highest education level: Not on file  Occupational History   Not on file  Tobacco Use   Smoking status: Never   Smokeless tobacco: Never  Vaping Use   Vaping status: Never Used  Substance and Sexual Activity   Alcohol use: No   Drug use: No   Sexual activity: Not Currently  Other Topics Concern   Not on file  Social History Narrative   Not on file   Social Drivers of Health   Financial Resource Strain: Low Risk  (10/27/2021)   Overall Financial Resource Strain (CARDIA)    Difficulty of Paying Living Expenses: Not hard at all  Food Insecurity: No Food Insecurity (10/27/2021)   Hunger Vital Sign    Worried About Running Out of Food  in the Last Year: Never true    Ran Out of Food in the Last Year: Never true  Transportation Needs: No Transportation Needs (10/27/2021)   PRAPARE - Administrator, Civil Service (Medical): No    Lack of Transportation (Non-Medical): No  Physical Activity: Sufficiently Active (10/27/2021)   Exercise Vital Sign    Days of Exercise per Week: 7 days    Minutes of Exercise per Session: 60 min  Stress: No Stress Concern Present (05/09/2023)   [employer] of Occupational Health - Occupational Stress Questionnaire    Feeling of Stress : Not at all  Social Connections: Moderately Integrated (05/09/2023)   Social Connection and Isolation Panel [NHANES]    Frequency of Communication with Friends and Family: More than three times a week     Frequency of Social Gatherings with Friends and Family: More than three times a week    Attends Religious Services: More than 4 times per year    Active Member of [employer] or Organizations: Yes    Attends Banker Meetings: More than 4 times per year    Marital Status: Widowed  Intimate Partner Violence: Not At Risk (10/27/2021)   Humiliation, Afraid, Rape, and Kick questionnaire    Fear of Current or Ex-Partner: No    Emotionally Abused: No    Physically Abused: No    Sexually Abused: No    Review of Systems  All other systems reviewed and are negative.       Objective    BP 134/79 (BP Location: Right Arm, Patient Position: Sitting, Cuff Size: Large)   Pulse 97   Temp 98.7 F (37.1 C) (Oral)   Resp 16   Ht 5\' 3"  (1.6 m)   Wt 204 lb 9.6 oz (92.8 kg)   SpO2 98%   BMI 36.24 kg/m   Physical Exam Vitals and nursing note reviewed.  Constitutional:      General: She is not in acute distress. Cardiovascular:     Rate and Rhythm: Normal rate and regular rhythm.  Pulmonary:     Effort: Pulmonary effort is normal.     Breath sounds: Normal breath sounds.  Abdominal:     Palpations: Abdomen is soft.     Tenderness: There is no abdominal tenderness.  Musculoskeletal:     Comments: Utilizing cane for stability  Neurological:     General: No focal deficit present.     Mental Status: She is alert and oriented to person, place, and time.         Assessment & Plan:   Type 2 diabetes mellitus with other circulatory complication, without long-term current use of insulin (HCC) -     metFORMIN HCl ER; Take 1 tablet (500 mg total) by mouth daily.  Dispense: 90 tablet; Refill: 1  Essential hypertension  Other hyperlipidemia  Class 2 severe obesity due to excess calories with serious comorbidity and body mass index (BMI) of 37.0 to 37.9 in adult [facility])  Gastroesophageal reflux disease without esophagitis  Other orders -     Atorvastatin Calcium; Take 1 tablet (40  mg total) by mouth daily.  Dispense: 90 tablet; Refill: 1 -     Pantoprazole Sodium; Take 1 tablet (40 mg total) by mouth daily.  Dispense: 90 tablet; Refill: 1     Return in about 4 months (around 12/07/2023).   Tommie Raymond, MD

## 2023-08-19 ENCOUNTER — Telehealth: Payer: Self-pay | Admitting: Family Medicine

## 2023-08-19 NOTE — Telephone Encounter (Signed)
error 

## 2023-08-19 NOTE — Telephone Encounter (Signed)
Patient dropped off document  Prescription/Certificate of Medical Necessity , to be filled out by provider. Patient requested to send it back via Fax within 5-days. Document is located in providers tray at front office.Please fax at  [phone]

## 2023-08-20 NOTE — Telephone Encounter (Signed)
 Provider has papers at her desk. Awaiting signature

## 2023-09-18 ENCOUNTER — Encounter (HOSPITAL_BASED_OUTPATIENT_CLINIC_OR_DEPARTMENT_OTHER): Payer: Self-pay | Admitting: *Deleted

## 2023-09-18 ENCOUNTER — Emergency Department (HOSPITAL_BASED_OUTPATIENT_CLINIC_OR_DEPARTMENT_OTHER)
Admission: EM | Admit: 2023-09-18 | Discharge: 2023-09-18 | Disposition: A | Discharge status: Home / Self Care | Payer: 59 | Attending: Emergency Medicine | Admitting: Emergency Medicine

## 2023-09-18 ENCOUNTER — Other Ambulatory Visit: Payer: Self-pay

## 2023-09-18 ENCOUNTER — Emergency Department (HOSPITAL_BASED_OUTPATIENT_CLINIC_OR_DEPARTMENT_OTHER): Payer: 59

## 2023-09-18 DIAGNOSIS — Z7901 Long term (current) use of anticoagulants: Secondary | ICD-10-CM | POA: Diagnosis not present

## 2023-09-18 DIAGNOSIS — M25561 Pain in right knee: Secondary | ICD-10-CM | POA: Diagnosis not present

## 2023-09-18 DIAGNOSIS — E119 Type 2 diabetes mellitus without complications: Secondary | ICD-10-CM | POA: Diagnosis not present

## 2023-09-18 DIAGNOSIS — Z7984 Long term (current) use of oral hypoglycemic drugs: Secondary | ICD-10-CM | POA: Insufficient documentation

## 2023-09-18 DIAGNOSIS — M1711 Unilateral primary osteoarthritis, right knee: Secondary | ICD-10-CM | POA: Diagnosis not present

## 2023-09-18 MED ORDER — NAPROXEN 375 MG PO TABS: 375.0000 mg | ORAL_TABLET | Freq: Two times a day (BID) | ORAL | 0 refills | Status: AC

## 2023-09-18 MED ORDER — PREDNISONE 50 MG PO TABS
60.0000 mg | ORAL_TABLET | Freq: Once | ORAL | Status: AC
  Administered 2023-09-18: 60 mg via ORAL
  Filled 2023-09-18: qty 1

## 2023-09-18 MED ORDER — LIDOCAINE 5 % EX PTCH: 1.0000 | MEDICATED_PATCH | CUTANEOUS | 0 refills | Status: AC

## 2023-09-18 MED ORDER — KETOROLAC TROMETHAMINE 15 MG/ML IJ SOLN
15.0000 mg | Freq: Once | INTRAMUSCULAR | Status: AC
  Administered 2023-09-18: 15 mg via INTRAMUSCULAR
  Filled 2023-09-18: qty 1

## 2023-09-18 MED ORDER — PREDNISONE 10 MG (21) PO TBPK: ORAL_TABLET | Freq: Every day | ORAL | 0 refills | Status: DC

## 2023-09-18 MED ORDER — LIDOCAINE 5 % EX PTCH
1.0000 | MEDICATED_PATCH | CUTANEOUS | Status: DC
  Administered 2023-09-18: 1 via TRANSDERMAL
  Filled 2023-09-18: qty 1

## 2023-09-18 NOTE — ED Triage Notes (Signed)
Pt is here for increasing right knee pain which began Monday but is not associated with any trauma.  Hx of arthritis.

## 2023-09-18 NOTE — ED Provider Notes (Signed)
[place] [facility] Provider Note   CSN: [id number] Arrival date & time: 09/18/23  1036     History  Chief Complaint  Patient presents with   Knee Pain    Carrie Kline is a 78 y.o. female with medical history of gout, CVA with right-sided deficits, diabetes, acquired right foot drop.  Patient presents to the ED for evaluation of right knee pain.  Reports that since Monday she has had knee pain in her right knee which is relieved by Tylenol but always returns.  She denies any preceding trauma or event to account for this pain.  Denies fevers, overlying skin change, warmth to the knee.  Reports a history of gout, arthritis.  Denies nausea, vomiting.  Denies any back pain.   Knee Pain Associated symptoms: no fever        Home Medications Prior to Admission medications   Medication Sig Start Date End Date Taking? Authorizing Provider  lidocaine (LIDODERM) 5 % Place 1 patch onto the skin daily. Remove & Discard patch within 12 hours or as directed by MD 09/18/23  Yes Al Decant, PA-C  naproxen (NAPROSYN) 375 MG tablet Take 1 tablet (375 mg total) by mouth 2 (two) times daily. 09/18/23  Yes Al Decant, PA-C  predniSONE (STERAPRED UNI-PAK 21 TAB) 10 MG (21) TBPK tablet Take by mouth daily. Take 6 tabs by mouth daily  for 2 days, then 5 tabs for 2 days, then 4 tabs for 2 days, then 3 tabs for 2 days, 2 tabs for 2 days, then 1 tab by mouth daily for 2 days 09/18/23  Yes Delice Bison F, PA-C  albuterol (VENTOLIN HFA) 108 (90 Base) MCG/ACT inhaler INHALE 2 PUFFS INTO THE LUNGS EVERY 6 HOURS AS NEEDED FOR WHEEZING OR SHORTNESS OF BREATH 02/28/22   Georganna Skeans, MD  atorvastatin (LIPITOR) 40 MG tablet Take 1 tablet (40 mg total) by mouth daily. 08/08/23   Georganna Skeans, MD  clopidogrel (PLAVIX) 75 MG tablet Take 1 tablet (75 mg total) by mouth daily. 04/30/23   Georganna Skeans, MD  fluticasone [facility]) 50 MCG/ACT nasal spray SHAKE LIQUID  AND USE 1 SPRAY IN EACH NOSTRIL TWICE DAILY AS NEEDED 01/22/23   Georganna Skeans, MD  hydrOXYzine (ATARAX) 25 MG tablet Take 25 mg by mouth 4 (four) times daily. 12/14/21   [provider]  ketorolac (ACULAR) 0.5 % ophthalmic solution SMARTSIG:In Eye(s) 01/17/23   [provider]  lisinopril (ZESTRIL) 10 MG tablet Take 1 tablet (10 mg total) by mouth daily. 05/09/23   Georganna Skeans, MD  metFORMIN (GLUCOPHAGE-XR) 500 MG 24 hr tablet Take 1 tablet (500 mg total) by mouth daily. 08/08/23   Georganna Skeans, MD  ofloxacin (OCUFLOX) 0.3 % ophthalmic solution  01/17/23   [provider]  pantoprazole (PROTONIX) 40 MG tablet Take 1 tablet (40 mg total) by mouth daily. 08/08/23   Georganna Skeans, MD  prednisoLONE acetate (PRED FORTE) 1 % ophthalmic suspension SMARTSIG:In Eye(s) 01/17/23   [provider]  promethazine (PHENERGAN) 12.5 MG tablet Take 1 tablet (12.5 mg total) by mouth every 8 (eight) hours as needed for up to 10 doses for nausea or vomiting. 05/24/22   Virgina Norfolk, DO      Allergies    Codeine, Hydrocodone, Hydrocortisone, and Propoxyphene    Review of Systems   Review of Systems  Constitutional:  Negative for chills and fever.  Musculoskeletal:  Positive for arthralgias. Negative for myalgias.  All other systems  reviewed and are negative.   Physical Exam Updated Vital Signs BP (!) 157/80   Pulse 94   Temp 98.5 F (36.9 C) (Oral)   Resp 16   SpO2 100%  Physical Exam Vitals and nursing note reviewed.  Constitutional:      General: She is not in acute distress.    Appearance: Normal appearance. She is not ill-appearing, toxic-appearing or diaphoretic.  HENT:     Head: Normocephalic and atraumatic.     Nose: Nose normal.     Mouth/Throat:     Mouth: Mucous membranes are moist.     Pharynx: Oropharynx is clear.  Eyes:     Extraocular Movements: Extraocular movements intact.     Conjunctiva/sclera: Conjunctivae normal.     Pupils: Pupils are  equal, round, and reactive to light.  Cardiovascular:     Rate and Rhythm: Normal rate and regular rhythm.  Pulmonary:     Effort: Pulmonary effort is normal.     Breath sounds: Normal breath sounds. No wheezing.  Abdominal:     General: Abdomen is flat.     Tenderness: There is no abdominal tenderness.  Musculoskeletal:     Cervical back: Normal range of motion and neck supple. No tenderness.     Right knee: Swelling present. Normal range of motion. Tenderness present.     Comments: Slight swelling to right knee.  No overlying skin change, no erythema, no warmth.  She does have full range of motion of her right knee that is not painful.  Nonfocal tenderness.  Skin:    General: Skin is warm and dry.     Capillary Refill: Capillary refill takes less than 2 seconds.  Neurological:     Mental Status: She is alert and oriented to person, place, and time.     ED Results / Procedures / Treatments   Labs (all labs ordered are listed, but only abnormal results are displayed) Labs Reviewed - No data to display  EKG None  Radiology DG Knee Complete 4 Views Right Result Date: 09/18/2023 CLINICAL DATA:  Severe right knee pain. EXAM: RIGHT KNEE - COMPLETE 4+ VIEW COMPARISON:  None Available. FINDINGS: No acute fracture or dislocation. No aggressive osseous lesion. There are degenerative changes of the knee joint in the form of mildly reduced medial tibio-femoral compartment joint space, tibial spiking and tricompartmental osteophytosis. Note is made of reduced patellofemoral joint space as well. There is meniscal chondrocalcinosis. No knee effusion or focal soft tissue swelling. No radiopaque foreign bodies. IMPRESSION: 1. No acute osseous abnormality of the right knee joint. 2. 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 Electronically Signed   By: Jules Schick M.D.   On: 09/18/2023 11:31     Procedures Procedures   Medications Ordered in ED Medications  lidocaine (LIDODERM) 5 % 1 patch (1 patch Transdermal Patch Applied 09/18/23 1156)  ketorolac (TORADOL) 15 MG/ML injection 15 mg (15 mg Intramuscular Given 09/18/23 1157)  predniSONE (DELTASONE) tablet 60 mg (60 mg Oral Given 09/18/23 1217)    ED Course/ Medical Decision Making/ A&P  Medical Decision Making Amount and/or Complexity of Data Reviewed Radiology: ordered.   78 year old female presents for evaluation of right knee pain.  Please see HPI for further details.  On exam patient right knee has slight swelling compared to left however no overlying skin change, erythema, warmth.  She has full range of motion of her right knee.  No deformity noted.  Will collect x-ray imaging.  Patient has history of gout as well as arthritis.  Patient plain film imaging of right knee shows degenerative changes of the right knee joint however no obvious deformity or acute process.  Patient provided Toradol shot, Lidoderm patch for pain.  At this time, unsure of etiology of patient pain due to gout versus arthritis.  Will send patient home on Medrol Dosepak as well as NSAIDs.  Patient chart reviewed, creatinine is stable.  She denies a history of CKD.  She will follow-up with her PCP.  She was advised to return to the ED with any new or worsening symptoms and she voiced understanding.  Stable to discharge home.   Final Clinical Impression(s) / ED Diagnoses Final diagnoses:  Acute pain of right knee    Rx / DC Orders ED Discharge Orders          Ordered    predniSONE (STERAPRED UNI-PAK 21 TAB) 10 MG (21) TBPK tablet  Daily        09/18/23 1221    naproxen (NAPROSYN) 375 MG tablet  2 times daily        09/18/23 1221    lidocaine (LIDODERM) 5 %  Every 24 hours        09/18/23 1221              Clent Ridges 09/18/23 1222    Ernie Avena, MD 09/18/23 1416

## 2023-09-18 NOTE — Discharge Instructions (Addendum)
It was a pleasure taking part in your care.  As we discussed, I am treating you for both gout flare as well as possible arthritis.  Please begin taking steroid Dosepak sent to your pharmacy.  Please do not begin this until tomorrow.  Please follow directions on how to administer this.  Please also begin taking naproxen twice a day for pain in your knee.  I have also sent you home with Lidoderm patches.  If these are too expensive with insurance, please purchase Salonpas patches.  Please follow-up with your PCP regarding this.  Please return to the ED with any new or worsening symptoms such as fevers, warmth or redness to your right knee.

## 2023-09-19 ENCOUNTER — Telehealth: Payer: Self-pay | Admitting: Family Medicine

## 2023-09-19 NOTE — Telephone Encounter (Unsigned)
Copied from CRM [id number]. Topic: Clinical - Medication Question >> Sep 19, 2023 12:13 PM Patsy Lager T wrote: Reason for CRM: patient stated the medication lidocaine (LIDODERM) 5 % she was given at the ER on 09/18/23 needs prior approval.

## 2023-10-08 ENCOUNTER — Telehealth: Payer: Self-pay | Admitting: Adult Health

## 2023-10-08 NOTE — Telephone Encounter (Signed)
Pt called requesting her f/u with NP, when offered next available pt declined.  Pt said if the pain she is having in her head continues she will just go to ED.  This is FYI pt is not requesting a call back, she said it is not urgent.

## 2023-10-14 ENCOUNTER — Ambulatory Visit: Payer: Self-pay | Admitting: Family Medicine

## 2023-10-14 NOTE — Telephone Encounter (Signed)
 Copied from CRM [id number]. Topic: Clinical - Red Word Triage >> Oct 14, 2023 10:36 AM Carrie Kline wrote: Red Word that prompted transfer to Nurse Triage: right side of the face is swollen and spitting up green  Guck   Chief Complaint: Possible Sinus Infection Symptoms: right side of face around cheekbone (sinus area) Frequency: a few days Pertinent Negatives: Patient denies fever, cough, sore throat, earache, difficulty breathing Disposition: [] ED /[x] Urgent Care (no appt availability in office) / [] Appointment(In office/virtual)/ []  [facility]/ [] Home Care/ [] Refused Recommended Disposition /[] [facility]/ []  Follow-up with PCP Additional Notes: Patient called and advised that she has some slight swelling around her right cheekbone (sinus area) and she has green phlegm.  She said that the swelling has gone now and isn't that bad now and she has had this before and was told by a doctor that it was her sinuses.  Patient denies any fever, cough, sore throat, earaches, difficulty breathing, or chest pain. She said that she has been feeling bad for just a few days.  She has green phlegm and sometimes it had a little bit of blood in it. Patient states that she was seen recently for gout or arthritis in knee and given prednisone. Prednisone given and blood sugars were up. Patient states that she stopped taking the Prednisone last Friday because her blood sugars were going up. She states that she had 42 prednisone tablets and now she has ten left since she stopped. She just wanted to mention this. Patient was advised that there are no available appointments in the near future at her PCP office.  She is advised that urgent care is an option and I could check the next closest office to her.  Patient states that if she doesn't feel any better then she will call us back or go to an urgent care.  She said that she doesn't feel that bad but was wondering if her PCP would call in a  prescription for her without seeing her.  I advised her that I would send this message to her PCP but I also advised her that a lot of the time, providers like to assess patients before sending in prescriptions.  Patient is also advised that if she gets worse to go to the emergency room.  Patient verbalized understanding.   Nosebleed at 1am just a little bit per patient. Reason for Disposition  [1] Using nasal washes and pain medicine > 24 hours AND [2] sinus pain (around cheekbone or eye) persists  [1] Mild face swelling (puffiness) AND [2] persists > 3 days  Answer Assessment - Initial Assessment Questions 1. LOCATION: "Where does it hurt?"      Right side of face 2. ONSET: "When did the sinus pain start?"  (e.g., hours, days)      A couple of days ago 3. SEVERITY: "How bad is the pain?"   (Scale 1-10; mild, moderate or severe)   - MILD (1-3): doesn't interfere with normal activities    - MODERATE (4-7): interferes with normal activities (e.g., work or school) or awakens from sleep   - SEVERE (8-10): excruciating pain and patient unable to do any normal activities        mild 4. RECURRENT SYMPTOM: "Have you ever had sinus problems before?" If Yes, ask: "When was the last time?" and "What happened that time?"      yes 5. NASAL CONGESTION: "Is the nose blocked?" If Yes, ask: "Can you open it or must  you breathe through your mouth?"     "Feels stuffy but I use Flonase" 6. NASAL DISCHARGE: "Do you have discharge from your nose?" If so ask, "What color?"     Green with a little bit of blood sometimes 7. FEVER: "Do you have a fever?" If Yes, ask: "What is it, how was it measured, and when did it start?"      no 8. OTHER SYMPTOMS: "Do you have any other symptoms?" (e.g., sore throat, cough, earache, difficulty breathing)     no  Answer Assessment - Initial Assessment Questions 1. ONSET: "When did the swelling start?" (e.g., minutes, hours, days)     Couple of days (2 days) 2. LOCATION:  "What part of the face is swollen?"     Right side around cheekbone(sinuses) 3. SEVERITY: "How swollen is it?"     Not that bad 4. ITCHING: "Is there any itching?" If Yes, ask: "How much?"   (Scale 1-10; mild, moderate or severe)     no 5. PAIN: "Is the swelling painful to touch?" If Yes, ask: "How painful is it?"   (Scale 1-10; mild, moderate or severe)   - NONE (0): no pain   - MILD (1-3): doesn't interfere with normal activities    - MODERATE (4-7): interferes with normal activities or awakens from sleep    - SEVERE (8-10): excruciating pain, unable to do any normal activities      0 6. FEVER: "Do you have a fever?" If Yes, ask: "What is it, how was it measured, and when did it start?"      no 7. CAUSE: "What do you think is causing the face swelling?"     Sinus infection 8. RECURRENT SYMPTOM: "Have you had face swelling before?" If Yes, ask: "When was the last time?" "What happened that time?"     One time before and doctor told her it was her sinuses 9. OTHER SYMPTOMS: "Do you have any other symptoms?" (e.g., toothache, leg swelling)       no  Protocols used: Sinus Pain or Congestion-A-AH, Face Swelling-A-AH

## 2023-10-27 NOTE — Progress Notes (Unsigned)
 S:     No chief complaint on file.  78 y.o. female who presents for diabetes evaluation, education, and management in the context of the LIBERATE Study.  PMH is significant for T2DM, stroke (June 2022), gout, and HTN.   Patient was referred and last seen by Primary Care Provider, Dr. Andrey Campanile, on 08/08/23.  Today, patient arrives in *** good spirits and presents without *** any assistance. ***  Patient reports Diabetes was diagnosed in ***.   Family/Social History:  Fhx: MI, DM, thyroid disease Tobacco: never smoked Alcohol: none reported  Current diabetes medications include: metformin 500 mg XR daily Current hyperlipidemia medications include: atorvastatin 40 mg PO daily  Patient reports adherence to taking all medications as prescribed.  *** Patient denies adherence with medications, reports missing *** medications *** times per week, on average.  Insurance coverage: [employer]  Patient {Actions; denies-reports:120008} hypoglycemic events.     Patient {Actions; denies-reports:120008} nocturia (nighttime urination).  Patient {Actions; denies-reports:120008} neuropathy (nerve pain). Patient {Actions; denies-reports:120008} visual changes. Patient {Actions; denies-reports:120008} self foot exams.   Patient reported dietary habits: Eats *** meals/day -Major improvement in diet. Specifically, her daughters have helped her cut out sweets  Breakfast: *** Lunch: *** Dinner: *** Snacks: *** Drinks: ***  Within the past 12 months, did you worry whether your food would run out before you got money to buy more? {YES NO:22349} Within the past 12 months, did the food you bought run out, and you didn't have money to get more? {YES NO:22349} PHQ-9 Score: ***  Patient-reported exercise habits:  -walking from mailbox to porch fives a day  -exercises with cane   O:   ROS  Physical Exam   Lab Results  Component Value Date   HGBA1C 6.4 07/30/2023    ***POC A1c  Today: ***  There were no vitals filed for this visit.   Lipid Panel     Component Value Date/Time   CHOL 116 10/25/2022 1515   TRIG 120 10/25/2022 1515   HDL 37 (L) 10/25/2022 1515   CHOLHDL 3.1 10/25/2022 1515   CHOLHDL 5.0 02/24/2021 1203   VLDL 16 02/24/2021 1203   LDLCALC 57 10/25/2022 1515    Clinical Atherosclerotic Cardiovascular Disease (ASCVD): Yes  The ASCVD Risk score (Arnett DK, et al., 2019) failed to calculate for the following reasons:   Risk score cannot be calculated because patient has a medical history suggesting prior/existing ASCVD   Patient is participating in a Managed Medicaid Plan: No    A/P:  LIBERATE Study:  - Provided voucher for continued supply of Libre 3 sensors.   Diabetes longstanding *** currently controlled with A1c ***. Patient is *** able to verbalize appropriate hypoglycemia management plan. Medication adherence appears ***. Control is suboptimal due to ***. -{Meds adjust:18428} basal insulin *** (insulin ***). Patient will continue to titrate 1 unit every *** days if fasting blood sugar > 100mg /dl until fasting blood sugars reach goal or next visit.  -{Meds adjust:18428} rapid insulin *** (insulin ***) to ***.  -{Meds adjust:18428} GLP-1 *** (generic ***) to ***.  -{Meds adjust:18428} SGLT2-I *** (generic ***) to ***. Counseled on sick day rules. -{Meds adjust:18428} metformin *** to ***.  -Patient educated on purpose, proper use, and potential adverse effects of ***.  -Extensively discussed pathophysiology of diabetes, recommended lifestyle interventions, dietary effects on blood sugar control.  -Counseled on s/sx of and management of hypoglycemia.  -Next A1c anticipated ***.   ASCVD risk - primary ***secondary prevention in patient with  diabetes. Last LDL is *** not at goal of <16 *** mg/dL. ASCVD risk factors include *** and 10-year ASCVD risk score of ***. {Desc; low/moderate/high:110033} intensity statin indicated.  -{Meds  adjust:18428} ***statin *** mg.   Hypertension longstanding *** currently ***. Blood pressure goal of <130/80 *** mmHg. Medication adherence ***. Blood pressure control is suboptimal due to ***. -***  Written patient instructions provided. Patient verbalized understanding of treatment plan.  Total time in face to face counseling *** minutes.    Follow-up:  Pharmacist 3 mo?***. PCP clinic visit in 1 mo, 12/09/23.   Nils Pyle, PharmD PGY1 Pharmacy Resident

## 2023-10-28 ENCOUNTER — Ambulatory Visit: Payer: 59 | Attending: Family Medicine | Admitting: Pharmacist

## 2023-10-28 ENCOUNTER — Encounter: Payer: Self-pay | Admitting: Pharmacist

## 2023-10-28 DIAGNOSIS — E1159 Type 2 diabetes mellitus with other circulatory complications: Secondary | ICD-10-CM | POA: Diagnosis not present

## 2023-10-28 DIAGNOSIS — Z7984 Long term (current) use of oral hypoglycemic drugs: Secondary | ICD-10-CM

## 2023-10-28 LAB — POCT GLYCOSYLATED HEMOGLOBIN (HGB A1C): HbA1c, POC (controlled diabetic range): 6.2 % (ref 0.0–7.0)

## 2023-10-28 MED ORDER — ACCU-CHEK GUIDE TEST VI STRP
ORAL_STRIP | 3 refills | Status: AC
Start: 2023-10-28 — End: ?

## 2023-10-28 MED ORDER — ACCU-CHEK GUIDE W/DEVICE KIT
PACK | 0 refills | Status: AC
Start: 2023-10-28 — End: ?

## 2023-10-28 MED ORDER — ACCU-CHEK SOFTCLIX LANCETS MISC
12 refills | Status: AC
Start: 2023-10-28 — End: ?

## 2023-10-28 NOTE — Research (Signed)
 S:     Chief Complaint  Patient presents with   Diabetes   Hyperlipidemia   78 y.o. female who presents for diabetes evaluation, education, and management in the context of the LIBERATE Study.  PMH is significant for T2D, CVA (2022), HLD, gout, HTN  Patient was referred  by Primary Care Provider, Dr. Andrey Campanile, on 05/09/23.   At last visit with pharmacy on 07/30/23, CGM report looked excellent. She is here for an updated A1c and I have summarized her CGM report below..   Today, patient arrives in  good spirits and presents with her daughter. She has no complaints today. She reports that she has very occasional loose stools with metformin - but this is not bothersome to her.   Patient reports Diabetes was diagnosed in 2024.   Family/Social History:  Fhx: MI, DM, thyroid disease Tobacco: never smoker  Alcohol: none reported   Current diabetes medications include: metformin XR 500 mg daily Current hypertension medications include: lisinopril 10 mg daily Current hyperlipidemia medications include: atorvastatin 40 mg daily  Patient reports adherence to taking all medications as prescribed.   Do you feel that your medications are working for you? yes Have you been experiencing any side effects to the medications prescribed? no Do you have any problems obtaining medications due to transportation or finances? no Insurance coverage: UHC Dual Complete  Patient denies hypoglycemic events.  CGM Study Consent: Yes Study visit: 6 Month Follow Up Visit  CGM Data Download date: 07/07/23-10/04/23 % Time CGM Is Active: 99 % Average glucose (mg/dL): 147 mg/dL Glucose Management Indicator (%): 6.3 % Glucose Variability (%): 23 % Time Above Range >180 mg/dL (%): 6 % Time in Range 70-180 mg/dL (%): 94 % Time Below Range <70 mg/dL (%): 0 %  Diabetes Distress Scale Feeling like diabetes is taking up too much of my mental and physical energy every day.: Not a problem Feeling that my doctor  doesn't know enough about diabetes and diabetic care. : Not a problem Feeling angry, scared, and/or depressed when I think about living with diabetes : Not a problem Feeling that my doctor doesn't give my clear directions on how to manage my diabetes. : Not a problem Feeling that im not testing my blood sugars frequently enough.: Not a problem Feeling that I'm often failing with my diabetes routine: Not a problem Feelling that friends and family are not supportive enough of self care efforts.: Not a problem Feeling that diabetes controls my life.: Not a problem Feeling that my doctor doesn't take my concerns seriously enough: Not a problem Not feeling confident in my day to day ability to manage diabetes: Not a problem Feeling that I will end up with serious long term complications no matter what I do.: Not a problem Feeling that I am not sticking closely enough to a good meal plan.: Not a problem Feeling that friends or family don't appreciate how difficult living with diabetes can be. : Not a problem Feeling overwhelmed by the demands of living with diabetes.: Not a problem Feeling that I don't have a doctor who I can see.: Not a problem Not feeling motivated to keep up my diabetes self management.: Not a problem Feeling that friends or family don't give me the emotional support that I would like. : Not a problem DDS17 Score: 17 Emotional Burden Score: 1 Physician related distress score: 1 Regimen Related Distress score : 1 Interpersonal distress score: 1   Patient denies nocturia (nighttime urination). Once/night -  this has improved Patient denies neuropathy (nerve pain). Patient denies visual changes. Patient reports self foot exams. No issues  Patient reported dietary habits: Eats 2 meals/day Lunch: boiled egg and two pieces of bacon.  Dinner: Endorses low carbohydrate diet Snacks: Noted that cheerios and oatmeal make her blood sugar go high, grapefruit, sugar free cookies,   Drinks: Mostly water, zero sugar seven up.  Patient-reported exercise habits:  - Walking from mailbox to porch five times a day - Exercise with cane   O:   ROS  Physical Exam  BP Readings from Last 3 Encounters:  09/18/23 (!) 157/80  08/08/23 134/79  05/09/23 127/82    Lab Results  Component Value Date   HGBA1C 6.2 10/28/2023    10/28/23 POC A1c Today: 6.2  There were no vitals filed for this visit.   Lipid Panel     Component Value Date/Time   CHOL 116 10/25/2022 1515   TRIG 120 10/25/2022 1515   HDL 37 (L) 10/25/2022 1515   CHOLHDL 3.1 10/25/2022 1515   CHOLHDL 5.0 02/24/2021 1203   VLDL 16 02/24/2021 1203   LDLCALC 57 10/25/2022 1515    Clinical Atherosclerotic Cardiovascular Disease (ASCVD): Yes  The ASCVD Risk score (Arnett DK, et al., 2019) failed to calculate for the following reasons:   Risk score cannot be calculated because patient has a medical history suggesting prior/existing ASCVD   Patient is participating in a Managed Medicaid Plan:  No    A/P:  LIBERATE Study:  - Provided voucher for continued supply of Libre 3 sensors. Patient reported that $75/month is too expensive for continued use of sensors.  Diabetes longstanding currently controlled with low-dose metformin monotherapy. Patient is  able to verbalize appropriate hypoglycemia management plan. Medication adherence appears optimal. Commended patient for lifestyle changes and medication adherence to improve glycemic control. Discussed risk/benefit of discontinuing metformin and managed DM with lifestyle- patient elected to continue once daily metformin - kidney function normal. -Continued metformin 500 mg XR daily -Sent Rx for glucometer and BG testing supplies for continued monitoring at home.  -Patient educated on purpose, proper use, and potential adverse effects of metformin.  -Extensively discussed pathophysiology of diabetes, recommended lifestyle interventions, dietary effects on  blood sugar control.  -Counseled on s/sx of and management of hypoglycemia.  -Next A1c anticipated 04/29/24.   ASCVD risk - secondary prevention in patient with diabetes. Last LDL is 57 mg/dL just above goal of <63  mg/dL given ASCVD + O7F. Appropriate to continue high intensity statin. -Discontinued atorvastatin 40 mg.   Written patient instructions provided. Patient verbalized understanding of treatment plan.  Total time in face to face counseling 25 minutes.    Follow-up:  Pharmacist 2 mo: 01/06/24 PCP clinic visit in 12/09/23.   Nils Pyle, PharmD PGY1 Pharmacy Resident

## 2023-10-31 ENCOUNTER — Encounter: Payer: Self-pay | Admitting: Family Medicine

## 2023-12-09 ENCOUNTER — Ambulatory Visit: Payer: 59 | Admitting: Family Medicine

## 2023-12-10 ENCOUNTER — Ambulatory Visit: Admitting: Family Medicine

## 2023-12-20 ENCOUNTER — Other Ambulatory Visit: Payer: Self-pay | Admitting: Family Medicine

## 2024-01-05 ENCOUNTER — Other Ambulatory Visit: Payer: Self-pay | Admitting: Family Medicine

## 2024-01-06 ENCOUNTER — Ambulatory Visit: Admitting: Pharmacist

## 2024-01-07 ENCOUNTER — Ambulatory Visit (INDEPENDENT_AMBULATORY_CARE_PROVIDER_SITE_OTHER): Admitting: Family Medicine

## 2024-01-07 ENCOUNTER — Encounter: Payer: Self-pay | Admitting: Family Medicine

## 2024-01-07 VITALS — BP 155/100 | HR 107 | Wt 195.2 lb

## 2024-01-07 DIAGNOSIS — E7849 Other hyperlipidemia: Secondary | ICD-10-CM

## 2024-01-07 DIAGNOSIS — I10 Essential (primary) hypertension: Secondary | ICD-10-CM

## 2024-01-07 DIAGNOSIS — J329 Chronic sinusitis, unspecified: Secondary | ICD-10-CM

## 2024-01-07 MED ORDER — AZITHROMYCIN 250 MG PO TABS: ORAL_TABLET | ORAL | 0 refills | Status: AC

## 2024-01-07 MED ORDER — LISINOPRIL 20 MG PO TABS: 20.0000 mg | ORAL_TABLET | Freq: Every day | ORAL | 0 refills | Status: DC

## 2024-01-08 ENCOUNTER — Encounter: Payer: Self-pay | Admitting: Family Medicine

## 2024-01-08 NOTE — Progress Notes (Signed)
 Established Patient Office Visit  Subjective    Patient ID: Carrie Kline, female    DOB: 03-22-1946  Age: 78 y.o. MRN: [id number]  CC:  Chief Complaint  Patient presents with   Medical Management of Chronic Issues   Nasal Congestion    HPI Carrie Kline presents routine follow up of hypertension. She reports med compliance. Patient also reports that she also has a sinus infection and has had green nasal discharge for about 2 weeks. She denies fever/chills.   Outpatient Encounter Medications as of 01/07/2024  Medication Sig   Accu-Chek Softclix Lancets lancets Use as instructed to monitor blood glucose once daily   albuterol  (VENTOLIN  HFA) 108 (90 Base) MCG/ACT inhaler INHALE 2 PUFFS INTO THE LUNGS EVERY 6 HOURS AS NEEDED FOR WHEEZING OR SHORTNESS OF BREATH   atorvastatin  (LIPITOR) 40 MG tablet Take 1 tablet (40 mg total) by mouth daily.   azithromycin (ZITHROMAX) 250 MG tablet Take 2 tablets on day 1, then 1 tablet daily on days 2 through 5   Blood Glucose Monitoring Suppl (ACCU-CHEK GUIDE) w/Device KIT Use as directed to monitor blood sugar once daily   clopidogrel  (PLAVIX ) 75 MG tablet Take 1 tablet (75 mg total) by mouth daily.   fluticasone  (FLONASE ) 50 MCG/ACT nasal spray SHAKE LIQUID AND USE 1 SPRAY IN EACH NOSTRIL TWICE DAILY AS NEEDED   glucose blood (ACCU-CHEK GUIDE TEST) test strip Use as instructed to check blood glucose once daily   hydrOXYzine (ATARAX) 25 MG tablet Take 25 mg by mouth 4 (four) times daily.   ketorolac  (ACULAR ) 0.5 % ophthalmic solution SMARTSIG:In Eye(s)   lidocaine  (LIDODERM ) 5 % Place 1 patch onto the skin daily. Remove & Discard patch within 12 hours or as directed by MD   lisinopril  (ZESTRIL ) 10 MG tablet TAKE 1 TABLET(10 MG) BY MOUTH DAILY   lisinopril  (ZESTRIL ) 20 MG tablet Take 1 tablet (20 mg total) by mouth daily.   metFORMIN  (GLUCOPHAGE -XR) 500 MG 24 hr tablet Take 1 tablet (500 mg total) by mouth daily.   naproxen  (NAPROSYN ) 375 MG tablet Take  1 tablet (375 mg total) by mouth 2 (two) times daily.   ofloxacin (OCUFLOX) 0.3 % ophthalmic solution    pantoprazole  (PROTONIX ) 40 MG tablet Take 1 tablet (40 mg total) by mouth daily.   prednisoLONE acetate (PRED FORTE) 1 % ophthalmic suspension SMARTSIG:In Eye(s)   predniSONE  (STERAPRED UNI-PAK 21 TAB) 10 MG (21) TBPK tablet Take by mouth daily. Take 6 tabs by mouth daily  for 2 days, then 5 tabs for 2 days, then 4 tabs for 2 days, then 3 tabs for 2 days, 2 tabs for 2 days, then 1 tab by mouth daily for 2 days   promethazine  (PHENERGAN ) 12.5 MG tablet Take 1 tablet (12.5 mg total) by mouth every 8 (eight) hours as needed for up to 10 doses for nausea or vomiting.   No facility-administered encounter medications on file as of 01/07/2024.    Past Medical History:  Diagnosis Date   Gout    Seasonal allergies    Stroke [facility])     Past Surgical History:  Procedure Laterality Date   ABDOMINAL HYSTERECTOMY     BACK SURGERY     GANGLION CYST EXCISION      Family History  Problem Relation Age of Onset   Heart attack Father    Diabetes Sister    Thyroid disease Daughter     Social History   Socioeconomic History   Marital status: Widowed  Spouse name: Not on file   Number of children: Not on file   Years of education: Not on file   Highest education level: Not on file  Occupational History   Not on file  Tobacco Use   Smoking status: Never   Smokeless tobacco: Never  Vaping Use   Vaping status: Never Used  Substance and Sexual Activity   Alcohol use: No   Drug use: No   Sexual activity: Not Currently  Other Topics Concern   Not on file  Social History Narrative   Not on file   Social Drivers of Health   Financial Resource Strain: Low Risk  (10/27/2021)   Overall Financial Resource Strain (CARDIA)    Difficulty of Paying Living Expenses: Not hard at all  Food Insecurity: No Food Insecurity (10/27/2021)   Hunger Vital Sign    Worried About Running Out of Food in the  Last Year: Never true    Ran Out of Food in the Last Year: Never true  Transportation Needs: No Transportation Needs (10/27/2021)   PRAPARE - Administrator, Civil Service (Medical): No    Lack of Transportation (Non-Medical): No  Physical Activity: Sufficiently Active (10/27/2021)   Exercise Vital Sign    Days of Exercise per Week: 7 days    Minutes of Exercise per Session: 60 min  Stress: No Stress Concern Present (05/09/2023)   [employer] of Occupational Health - Occupational Stress Questionnaire    Feeling of Stress : Not at all  Social Connections: Moderately Integrated (05/09/2023)   Social Connection and Isolation Panel [NHANES]    Frequency of Communication with Friends and Family: More than three times a week    Frequency of Social Gatherings with Friends and Family: More than three times a week    Attends Religious Services: More than 4 times per year    Active Member of [employer] or Organizations: Yes    Attends Banker Meetings: More than 4 times per year    Marital Status: Widowed  Intimate Partner Violence: Not At Risk (10/27/2021)   Humiliation, Afraid, Rape, and Kick questionnaire    Fear of Current or Ex-Partner: No    Emotionally Abused: No    Physically Abused: No    Sexually Abused: No    Review of Systems  All other systems reviewed and are negative.       Objective    BP (!) 155/100 (BP Location: Right Arm, Patient Position: Sitting, Cuff Size: Normal)   Pulse (!) 107   Wt 195 lb 3.2 oz (88.5 kg)   SpO2 94%   BMI 34.58 kg/m   Physical Exam Vitals and nursing note reviewed.  Constitutional:      General: She is not in acute distress. Cardiovascular:     Rate and Rhythm: Normal rate and regular rhythm.  Pulmonary:     Effort: Pulmonary effort is normal.     Breath sounds: Normal breath sounds.  Abdominal:     Palpations: Abdomen is soft.     Tenderness: There is no abdominal tenderness.  Musculoskeletal:     Comments:  Utilizing cane for stability  Neurological:     General: No focal deficit present.     Mental Status: She is alert and oriented to person, place, and time.         Assessment & Plan:   Essential hypertension  Chronic sinusitis, unspecified location  Other hyperlipidemia  Other orders -     Lisinopril ; Take  1 tablet (20 mg total) by mouth daily.  Dispense: 90 tablet; Refill: 0 -     Azithromycin; Take 2 tablets on day 1, then 1 tablet daily on days 2 through 5  Dispense: 6 tablet; Refill: 0     No follow-ups on file.   Arlo Lama, MD

## 2024-01-13 ENCOUNTER — Ambulatory Visit: Admitting: Pharmacist

## 2024-01-13 NOTE — Progress Notes (Deleted)
 S:     No chief complaint on file.  78 y.o. female who presents for diabetes evaluation, education, and management in the context of the LIBERATE Study.  PMH is significant for T2D, CVA (2022), HLD, gout, HTN   Patient was referred  by Primary Care Provider, Dr. Elvan Kline, on 05/09/23. She was seen for her last LIBERATE study on 10/28/23. Metformin  XR 500 mg daily was continued. She saw Dr. Elvan Kline on 01/07/24, and her BP was elevated to 155/100 mmHg. She was instructed to increase lisinopril  to 20 mg daily.   Today, patient arrives in  good spirits and presents with her daughter. She has no complaints today. She reports that she has very occasional loose stools with metformin  - but this is not bothersome to her. ****  Refill atorvastatin  40 Repeat BMET today have increasing lisinopril  (but only 6 days ago?)   Patient reports Diabetes was diagnosed in 2024.   Family/Social History:  Fhx: MI, DM, thyroid disease Tobacco: never smoker  Alcohol: none reported    Current diabetes medications include: metformin  XR 500 mg daily Current hypertension medications include: lisinopril  20 mg daily*** Current hyperlipidemia medications include: atorvastatin  40 mg daily   Patient reports adherence to taking all medications as prescribed.    Do you feel that your medications are working for you? yes Have you been experiencing any side effects to the medications prescribed? no Do you have any problems obtaining medications due to transportation or finances? no Insurance coverage: [employer] Dual Complete   Patient denies hypoglycemic events.  Reported home fasting blood sugars: ***  Reported 2 hour post-meal/random blood sugars: ***.  Patient denies nocturia (nighttime urination). Once/night - this has improved Patient denies neuropathy (nerve pain). Patient denies visual changes. Patient reports self foot exams. No issues   Patient reported dietary habits: Eats 2 meals/day Lunch: boiled egg and  two pieces of bacon.  Dinner: Endorses low carbohydrate diet Snacks: Noted that cheerios and oatmeal make her blood sugar go high. Other snacks include grapefruit, sugar free cookies,  Drinks: Mostly water, zero sugar seven up.   Patient-reported exercise habits:  - Walking from mailbox to porch five times a day - Exercise with cane  O:   ROS  Physical Exam  7 day average blood glucose: ***  Libre3 CGM Download today *** % Time CGM is active: ***% Average Glucose: *** mg/dL Glucose Management Indicator: ***  Glucose Variability: ***% (goal <36%) Time in Goal:  - Time in range 70-180: ***% - Time above range: ***% - Time below range: ***% Observed patterns:  Lab Results  Component Value Date   HGBA1C 6.2 10/28/2023   There were no vitals filed for this visit.  Lipid Panel     Component Value Date/Time   CHOL 116 10/25/2022 1515   TRIG 120 10/25/2022 1515   HDL 37 (L) 10/25/2022 1515   CHOLHDL 3.1 10/25/2022 1515   CHOLHDL 5.0 02/24/2021 1203   VLDL 16 02/24/2021 1203   LDLCALC 57 10/25/2022 1515    Clinical Atherosclerotic Cardiovascular Disease (ASCVD): Yes  The ASCVD Risk score (Arnett DK, et al., 2019) failed to calculate for the following reasons:   Risk score cannot be calculated because patient has a medical history suggesting prior/existing ASCVD   Patient is participating in a Managed Medicaid Plan:  No  Diabetes longstanding currently controlled with low-dose metformin  monotherapy. Patient is  able to verbalize appropriate hypoglycemia management plan. Medication adherence appears optimal. Commended patient for lifestyle changes and  medication adherence to improve glycemic control. Discussed risk/benefit of discontinuing metformin  and managed DM with lifestyle- patient elected to continue once daily metformin  - kidney function normal. -Continued metformin  500 mg XR daily -Sent Rx for glucometer and BG testing supplies for continued monitoring at home.   -Patient educated on purpose, proper use, and potential adverse effects of metformin .  -Extensively discussed pathophysiology of diabetes, recommended lifestyle interventions, dietary effects on blood sugar control.  -Counseled on s/sx of and management of hypoglycemia.  -Next A1c anticipated 04/29/24.    ASCVD risk - secondary prevention in patient with diabetes. Last LDL is 57 mg/dL just above goal of <16  mg/dL given ASCVD + X0R. Appropriate to continue high intensity statin. -Continued atorvastatin  40 mg.    Written patient instructions provided. Patient verbalized understanding of treatment plan.  Total time in face to face counseling 25 minutes.     Follow-up:  Pharmacist 2 mo: 01/06/24 PCP clinic visit in 12/09/23.    Carrie Kline, PharmD PGY1 Pharmacy Resident

## 2024-04-06 ENCOUNTER — Other Ambulatory Visit: Payer: Self-pay | Admitting: Family Medicine

## 2024-04-06 DIAGNOSIS — E1159 Type 2 diabetes mellitus with other circulatory complications: Secondary | ICD-10-CM

## 2024-04-06 NOTE — Telephone Encounter (Signed)
 Copied from CRM #[id number]. Topic: Clinical - Medication Refill >> Apr 06, 2024  9:54 AM Delon HERO wrote: Medication: metFORMIN  (GLUCOPHAGE -XR) 500 MG 24 hr tablet [[id number]]  Has the patient contacted their pharmacy? Yes (Agent: If no, request that the patient contact the pharmacy for the refill. If patient does not wish to contact the pharmacy document the reason why and proceed with request.) (Agent: If yes, when and what did the pharmacy advise?)  This is the patient's preferred pharmacy:  [id number], [place] - [address] AT [facility] [address] [place] [address] Phone: [phone] Fax: [phone]  Is this the correct pharmacy for this prescription? Yes If no, delete pharmacy and type the correct one.   Has the prescription been filled recently? Yes  Is the patient out of the medication? Yes  Has the patient been seen for an appointment in the last year OR does the patient have an upcoming appointment? Yes  Can we respond through MyChart? Yes  Agent: Please be advised that Rx refills may take up to 3 business days. We ask that you follow-up with your pharmacy.

## 2024-04-07 ENCOUNTER — Other Ambulatory Visit: Payer: Self-pay | Admitting: Family Medicine

## 2024-04-08 NOTE — Telephone Encounter (Signed)
 Copied from CRM #[id number]. Topic: Clinical - Medication Refill >> Apr 06, 2024  9:54 AM Delon HERO wrote: Medication: metFORMIN  (GLUCOPHAGE -XR) 500 MG 24 hr tablet [[id number]]  Has the patient contacted their pharmacy? Yes (Agent: If no, request that the patient contact the pharmacy for the refill. If patient does not wish to contact the pharmacy document the reason why and proceed with request.) (Agent: If yes, when and what did the pharmacy advise?)  This is the patient's preferred pharmacy:  [facility][id number] [facility] [place] - [address] AT [facility] [address] [place] [address] Phone: [phone] Fax: [phone]  Is this the correct pharmacy for this prescription? Yes If no, delete pharmacy and type the correct one.   Has the prescription been filled recently? Yes  Is the patient out of the medication? Yes  Has the patient been seen for an appointment in the last year OR does the patient have an upcoming appointment? Yes  Can we respond through MyChart? Yes  Agent: Please be advised that Rx refills may take up to 3 business days. We ask that you follow-up with your pharmacy. >> Apr 08, 2024 11:57 AM Emylou G wrote: Adv patient of turn around time.

## 2024-04-09 MED ORDER — METFORMIN HCL ER 500 MG PO TB24: 500.0000 mg | ORAL_TABLET | Freq: Every day | ORAL | 1 refills | Status: DC

## 2024-04-09 NOTE — Telephone Encounter (Signed)
 Requested medications are due for refill today.  yes  Requested medications are on the active medications list.  yes  Last refill. 08/08/2023 #90 1 rf  Future visit scheduled.   yes  Notes to clinic.  Labs are expired.    Requested Prescriptions  Pending Prescriptions Disp Refills   metFORMIN  (GLUCOPHAGE -XR) 500 MG 24 hr tablet 90 tablet 1    Sig: Take 1 tablet (500 mg total) by mouth daily.     Endocrinology:  Diabetes - Biguanides Failed - 04/09/2024 11:18 AM      Failed - Cr in normal range and within 360 days    Creatinine, Ser  Date Value Ref Range Status  10/25/2022 0.84 0.57 - 1.00 mg/dL Final         Failed - eGFR in normal range and within 360 days    GFR, Estimated  Date Value Ref Range Status  03/03/2022 >60 >60 mL/min Final    Comment:    (NOTE) Calculated using the CKD-EPI Creatinine Equation (2021)    eGFR  Date Value Ref Range Status  10/25/2022 72 >59 mL/min/1.73 Final         Failed - B12 Level in normal range and within 720 days    No results found for: VITAMINB12       Failed - CBC within normal limits and completed in the last 12 months    WBC  Date Value Ref Range Status  03/03/2022 5.5 4.0 - 10.5 K/uL Final   RBC  Date Value Ref Range Status  03/03/2022 4.46 3.87 - 5.11 MIL/uL Final   Hemoglobin  Date Value Ref Range Status  03/03/2022 13.0 12.0 - 15.0 g/dL Final   HCT  Date Value Ref Range Status  03/03/2022 39.6 36.0 - 46.0 % Final   MCHC  Date Value Ref Range Status  03/03/2022 32.8 30.0 - 36.0 g/dL Final   [facility]  Date Value Ref Range Status  03/03/2022 29.1 26.0 - 34.0 pg Final   MCV  Date Value Ref Range Status  03/03/2022 88.8 80.0 - 100.0 fL Final   No results found for: PLTCOUNTKUC, LABPLAT, POCPLA RDW  Date Value Ref Range Status  03/03/2022 13.0 11.5 - 15.5 % Final         Passed - HBA1C is between 0 and 7.9 and within 180 days    Hemoglobin A1C  Date Value Ref Range Status  09/12/2022 6.8  Final    HbA1c, POC (controlled diabetic range)  Date Value Ref Range Status  10/28/2023 6.2 0.0 - 7.0 % Final         Passed - Valid encounter within last 6 months    Recent Outpatient Visits           3 months ago Essential hypertension   [facility], [place], MD   8 months ago Type 2 diabetes mellitus with other circulatory complication, without long-term current use of insulin  [facility])   [facility], MD   9 months ago Type 2 diabetes mellitus with other circulatory complication, without long-term current use of insulin  (HCC)   [facility] [place] Fleeta Morris, [place] L, RPH-CPP   10 months ago Type 2 diabetes mellitus with other circulatory complication, without long-term current use of insulin  [facility])   [facility] [place]. [facility] Fleeta Morris Senior  L, RPH-CPP   11 months ago Type 2 diabetes mellitus with other circulatory complication, without long-term current use of insulin  [facility])   [facility], MD

## 2024-04-14 ENCOUNTER — Emergency Department (HOSPITAL_BASED_OUTPATIENT_CLINIC_OR_DEPARTMENT_OTHER)
Admission: EM | Admit: 2024-04-14 | Discharge: 2024-04-14 | Disposition: A | Discharge status: Home / Self Care | Attending: Emergency Medicine | Admitting: Emergency Medicine

## 2024-04-14 ENCOUNTER — Emergency Department (HOSPITAL_BASED_OUTPATIENT_CLINIC_OR_DEPARTMENT_OTHER)

## 2024-04-14 ENCOUNTER — Other Ambulatory Visit: Payer: Self-pay

## 2024-04-14 ENCOUNTER — Encounter (HOSPITAL_BASED_OUTPATIENT_CLINIC_OR_DEPARTMENT_OTHER): Payer: Self-pay | Admitting: Emergency Medicine

## 2024-04-14 DIAGNOSIS — Y939 Activity, unspecified: Secondary | ICD-10-CM | POA: Insufficient documentation

## 2024-04-14 DIAGNOSIS — M7022 Olecranon bursitis, left elbow: Secondary | ICD-10-CM | POA: Diagnosis not present

## 2024-04-14 DIAGNOSIS — M25422 Effusion, left elbow: Secondary | ICD-10-CM | POA: Diagnosis not present

## 2024-04-14 DIAGNOSIS — M778 Other enthesopathies, not elsewhere classified: Secondary | ICD-10-CM | POA: Diagnosis not present

## 2024-04-14 DIAGNOSIS — M25522 Pain in left elbow: Secondary | ICD-10-CM | POA: Diagnosis not present

## 2024-04-14 LAB — CBC WITH DIFFERENTIAL/PLATELET
Abs Immature Granulocytes: 0.02 K/uL (ref 0.00–0.07)
Basophils Absolute: 0 K/uL (ref 0.0–0.1)
Basophils Relative: 0 %
Eosinophils Absolute: 0.1 K/uL (ref 0.0–0.5)
Eosinophils Relative: 1 %
HCT: 36.7 % (ref 36.0–46.0)
Hemoglobin: 11.8 g/dL — ABNORMAL LOW (ref 12.0–15.0)
Immature Granulocytes: 0 %
Lymphocytes Relative: 23 %
Lymphs Abs: 1.6 K/uL (ref 0.7–4.0)
MCH: 28 pg (ref 26.0–34.0)
MCHC: 32.2 g/dL (ref 30.0–36.0)
MCV: 87.2 fL (ref 80.0–100.0)
Monocytes Absolute: 0.6 K/uL (ref 0.1–1.0)
Monocytes Relative: 8 %
Neutro Abs: 4.8 K/uL (ref 1.7–7.7)
Neutrophils Relative %: 68 %
Platelets: 188 K/uL (ref 150–400)
RBC: 4.21 MIL/uL (ref 3.87–5.11)
RDW: 13.1 % (ref 11.5–15.5)
WBC: 7.1 K/uL (ref 4.0–10.5)
nRBC: 0 % (ref 0.0–0.2)

## 2024-04-14 LAB — COMPREHENSIVE METABOLIC PANEL WITH GFR
ALT: 14 U/L (ref 0–44)
AST: 19 U/L (ref 15–41)
Albumin: 4.2 g/dL (ref 3.5–5.0)
Alkaline Phosphatase: 125 U/L (ref 38–126)
Anion gap: 15 (ref 5–15)
BUN: 15 mg/dL (ref 8–23)
CO2: 21 mmol/L — ABNORMAL LOW (ref 22–32)
Calcium: 10.1 mg/dL (ref 8.9–10.3)
Chloride: 103 mmol/L (ref 98–111)
Creatinine, Ser: 0.83 mg/dL (ref 0.44–1.00)
GFR, Estimated: >60 mL/min (ref >60)
Glucose, Bld: 102 mg/dL — ABNORMAL HIGH (ref 70–99)
Potassium: 4.4 mmol/L (ref 3.5–5.1)
Sodium: 139 mmol/L (ref 135–145)
Total Bilirubin: 0.4 mg/dL (ref 0.0–1.2)
Total Protein: 8.3 g/dL — ABNORMAL HIGH (ref 6.5–8.1)

## 2024-04-14 MED ORDER — TRAMADOL HCL 50 MG PO TABS
50.0000 mg | ORAL_TABLET | Freq: Once | ORAL | Status: AC
  Administered 2024-04-14: 50 mg via ORAL
  Filled 2024-04-14: qty 1

## 2024-04-14 MED ORDER — PREDNISONE 50 MG PO TABS
60.0000 mg | ORAL_TABLET | Freq: Once | ORAL | Status: AC
  Administered 2024-04-14: 60 mg via ORAL
  Filled 2024-04-14: qty 1

## 2024-04-14 MED ORDER — KETOROLAC TROMETHAMINE 30 MG/ML IJ SOLN
15.0000 mg | Freq: Once | INTRAMUSCULAR | Status: AC
  Administered 2024-04-14: 15 mg via INTRAVENOUS
  Filled 2024-04-14: qty 1

## 2024-04-14 MED ORDER — TRAMADOL HCL 50 MG PO TABS: 50.0000 mg | ORAL_TABLET | Freq: Four times a day (QID) | ORAL | 0 refills | Status: AC | PRN

## 2024-04-14 MED ORDER — PREDNISONE 10 MG PO TABS: ORAL_TABLET | ORAL | 0 refills | Status: DC

## 2024-04-14 MED ORDER — PREDNISONE 10 MG PO TABS: ORAL_TABLET | ORAL | 0 refills | Status: AC

## 2024-04-14 NOTE — ED Triage Notes (Signed)
 Pt reports excruciating pain in LT elbow, hx of gout, reports it feels like previous gout flare ups, sore to the touch, edema noted, denies any injury or trauma

## 2024-04-14 NOTE — ED Provider Notes (Signed)
 [place] [facility] Provider Note   CSN: [id number] Arrival date & time: 04/14/24  1717     Patient presents with: Joint Swelling and Joint Pain   Carrie Kline is a 78 y.o. female.   Patient to ED with pain and swelling of the left elbow x 2 days without injury or trauma. She reports a history of gout that feels the same. No fever. No other joint pain.   The history is provided by the patient. No language interpreter was used.       Prior to Admission medications   Medication Sig Start Date End Date Taking? Authorizing Provider  predniSONE  (DELTASONE ) 10 MG tablet Take 5 on day 2 Take 4 on day 3 Take 3 on day 4 Take 2 on day 5 Take 1 on day 6 04/14/24  Yes Morna Flud, Margit, PA-C  traMADol  (ULTRAM ) 50 MG tablet Take 1 tablet (50 mg total) by mouth every 6 (six) hours as needed. 04/14/24  Yes Odell Margit, PA-C  Accu-Chek Softclix Lancets lancets Use as instructed to monitor blood glucose once daily 10/28/23   Tanda Bleacher, MD  albuterol  (VENTOLIN  HFA) 108 (90 Base) MCG/ACT inhaler INHALE 2 PUFFS INTO THE LUNGS EVERY 6 HOURS AS NEEDED FOR WHEEZING OR SHORTNESS OF BREATH 02/28/22   Tanda Bleacher, MD  atorvastatin  (LIPITOR) 40 MG tablet Take 1 tablet (40 mg total) by mouth daily. 08/08/23   Tanda Bleacher, MD  Blood Glucose Monitoring Suppl (ACCU-CHEK GUIDE) w/Device KIT Use as directed to monitor blood sugar once daily 10/28/23   Tanda Bleacher, MD  clopidogrel  (PLAVIX ) 75 MG tablet Take 1 tablet (75 mg total) by mouth daily. 04/30/23   Tanda Bleacher, MD  fluticasone  (FLONASE ) 50 MCG/ACT nasal spray SHAKE LIQUID AND USE 1 SPRAY IN EACH NOSTRIL TWICE DAILY AS NEEDED 01/22/23   Tanda Bleacher, MD  glucose blood (ACCU-CHEK GUIDE TEST) test strip Use as instructed to check blood glucose once daily 10/28/23   Tanda Bleacher, MD  hydrOXYzine (ATARAX) 25 MG tablet Take 25 mg by mouth 4 (four) times daily. 12/14/21   [provider]  ketorolac  (ACULAR ) 0.5 %  ophthalmic solution SMARTSIG:In Eye(s) 01/17/23   [provider]  lidocaine  (LIDODERM ) 5 % Place 1 patch onto the skin daily. Remove & Discard patch within 12 hours or as directed by MD 09/18/23   Ruthell Lonni FALCON, PA-C  lisinopril  (ZESTRIL ) 10 MG tablet TAKE 1 TABLET(10 MG) BY MOUTH DAILY 12/20/23   Tanda Bleacher, MD  lisinopril  (ZESTRIL ) 20 MG tablet TAKE 1 TABLET(20 MG) BY MOUTH DAILY 04/09/24   Tanda Bleacher, MD  metFORMIN  (GLUCOPHAGE -XR) 500 MG 24 hr tablet Take 1 tablet (500 mg total) by mouth daily. 04/09/24   Tanda Bleacher, MD  naproxen  (NAPROSYN ) 375 MG tablet Take 1 tablet (375 mg total) by mouth 2 (two) times daily. 09/18/23   Ruthell Lonni FALCON, PA-C  ofloxacin (OCUFLOX) 0.3 % ophthalmic solution  01/17/23   [provider]  pantoprazole  (PROTONIX ) 40 MG tablet Take 1 tablet (40 mg total) by mouth daily. 08/08/23   Tanda Bleacher, MD  prednisoLONE acetate (PRED FORTE) 1 % ophthalmic suspension SMARTSIG:In Eye(s) 01/17/23   [provider]  promethazine  (PHENERGAN ) 12.5 MG tablet Take 1 tablet (12.5 mg total) by mouth every 8 (eight) hours as needed for up to 10 doses for nausea or vomiting. 05/24/22   Ruthe Cornet, DO    Allergies: Codeine, Hydrocodone, Hydrocortisone, and Propoxyphene    Review of Systems  Updated Vital Signs BP (!) 144/82 (BP Location: Right Arm)   Pulse (!) 108   Temp 99.1 F (37.3 C) (Oral)   Resp 18   Ht 5' 3 (1.6 m)   Wt 88 kg   SpO2 97%   BMI 34.37 kg/m   Physical Exam Constitutional:      General: She is not in acute distress.    Appearance: She is well-developed. She is not ill-appearing.  Pulmonary:     Effort: Pulmonary effort is normal.  Musculoskeletal:        General: Normal range of motion.     Cervical back: Normal range of motion.     Comments: Posterior left elbow swelling c/w olecranon bursitis. No redness. ROM limited by pain.  Skin:    General: Skin is warm and dry.  Neurological:     Mental  Status: She is alert and oriented to person, place, and time.     (all labs ordered are listed, but only abnormal results are displayed) Labs Reviewed  COMPREHENSIVE METABOLIC PANEL WITH GFR - Abnormal; Notable for the following components:      Result Value   CO2 21 (*)    Glucose, Bld 102 (*)    Total Protein 8.3 (*)    All other components within normal limits  CBC WITH DIFFERENTIAL/PLATELET - Abnormal; Notable for the following components:   Hemoglobin 11.8 (*)    All other components within normal limits    EKG: None  Radiology: DG Elbow Complete Left Result Date: 04/14/2024 CLINICAL DATA:  Pain EXAM: LEFT ELBOW - COMPLETE 3+ VIEW COMPARISON:  None Available. FINDINGS: Small joint effusion present. There is posterior and medial elbow soft tissue swelling. There is no acute fracture or dislocation identified. There is a large olecranon spur. Joint spaces are maintained. Osteophytes are seen from the medial and lateral epicondyles, likely degenerative. No cortical erosions identified. IMPRESSION: 1. Small joint effusion. 2. Posterior and medial elbow soft tissue swelling. 3. No acute fracture or dislocation. Electronically Signed   By: Greig Pique M.D.   On: 04/14/2024 18:03     Procedures   Medications Ordered in the ED  traMADol  (ULTRAM ) tablet 50 mg (has no administration in time range)  ketorolac  (TORADOL ) 30 MG/ML injection 15 mg (15 mg Intravenous Given 04/14/24 1826)  predniSONE  (DELTASONE ) tablet 60 mg (60 mg Oral Given 04/14/24 1827)    Clinical Course as of 04/14/24 1931  Tue Apr 14, 2024  1930 Patient to ED with painful swollen left elbow without injury. H/O gout per patient. Exam findings c/w bursitis. Will Rx prednisone  and Tramadol . Strongly encouraged PCP follow up this week for recheck.  [SU]    Clinical Course User Index [SU] Odell Balls, PA-C                                 Medical Decision Making Amount and/or Complexity of Data Reviewed Labs:  ordered. Radiology: ordered.  Risk Prescription drug management.        Final diagnoses:  Olecranon bursitis of left elbow    ED Discharge Orders          Ordered    predniSONE  (DELTASONE ) 10 MG tablet        04/14/24 1927    traMADol  (ULTRAM ) 50 MG tablet  Every 6 hours PRN        04/14/24 1927  Odell Balls, PA-C 04/14/24 1931    Lenor Hollering, MD 04/14/24 [phone]

## 2024-04-14 NOTE — Discharge Instructions (Signed)
 Take the medications as prescribed. Your next dose of prednisone  (day 2) is tomorrow as you had your first dose in the emergency department. Tramadol  for pain every 8 hours as needed.   Follow up with your doctor this week for recheck. REturn to the ED with any new or worsening symptoms.

## 2024-05-11 ENCOUNTER — Encounter: Payer: Self-pay | Admitting: Family

## 2024-05-11 ENCOUNTER — Ambulatory Visit (INDEPENDENT_AMBULATORY_CARE_PROVIDER_SITE_OTHER): Admitting: Family

## 2024-05-11 VITALS — BP 128/82 | HR 81 | Temp 98.3°F | Resp 16 | Ht 63.0 in | Wt 193.8 lb

## 2024-05-11 DIAGNOSIS — M7022 Olecranon bursitis, left elbow: Secondary | ICD-10-CM | POA: Diagnosis not present

## 2024-05-11 DIAGNOSIS — K219 Gastro-esophageal reflux disease without esophagitis: Secondary | ICD-10-CM

## 2024-05-11 DIAGNOSIS — J392 Other diseases of pharynx: Secondary | ICD-10-CM | POA: Diagnosis not present

## 2024-05-11 MED ORDER — PANTOPRAZOLE SODIUM 40 MG PO TBEC: 40.0000 mg | DELAYED_RELEASE_TABLET | Freq: Every day | ORAL | 0 refills | Status: AC

## 2024-05-11 NOTE — Progress Notes (Signed)
 Mucous in throat that she has been having for year since she had covid,  patient has been heartburn ever though she is taking medication for it.

## 2024-05-11 NOTE — Progress Notes (Signed)
 Patient ID: Carrie Kline, female    DOB: [birth year]  MRN: [id number]   Subjective: Urgent Care Follow-Up   Carrie Kline is a 78 y.o. female who presents for Urgent Care Follow-Up.   Her concerns today include:  - Patient seen on 04/14/2024 (1 hours) at [facility] Emergency Department at [facility] for olecranon bursitis of left elbow. Today patient reports feeling improved.  - States increased mucus of throat persisting for 1 year and began after having Covid. Denies red flag symptoms. Taking over-the-counter Mucinex  with minimal relief.  - Doing well on Pantoprazole , no issues/concerns.   Patient Active Problem List   Diagnosis Date Noted   Acquired right foot drop 12/26/2021   Acute cystitis with hematuria 09/07/2021   COVID-19 09/06/2021   Nausea and vomiting 09/06/2021   Spastic hemiplegia of right dominant side as late effect of cerebral infarction (HCC) 07/11/2021   Other hyperlipidemia 07/06/2021   Brainstem infarct, acute (HCC) 03/01/2021   Acute cerebral infarction (HCC) 02/24/2021   Thyroid nodule 02/24/2021   Diabetes (HCC) 02/24/2021     Current Outpatient Medications on File Prior to Visit  Medication Sig Dispense Refill   Accu-Chek Softclix Lancets lancets Use as instructed to monitor blood glucose once daily 100 each 12   albuterol  (VENTOLIN  HFA) 108 (90 Base) MCG/ACT inhaler INHALE 2 PUFFS INTO THE LUNGS EVERY 6 HOURS AS NEEDED FOR WHEEZING OR SHORTNESS OF BREATH 6.7 g 1   atorvastatin  (LIPITOR) 40 MG tablet Take 1 tablet (40 mg total) by mouth daily. 90 tablet 1   Blood Glucose Monitoring Suppl (ACCU-CHEK GUIDE) w/Device KIT Use as directed to monitor blood sugar once daily 1 kit 0   clopidogrel  (PLAVIX ) 75 MG tablet Take 1 tablet (75 mg total) by mouth daily. 90 tablet 3   fluticasone  (FLONASE ) 50 MCG/ACT nasal spray SHAKE LIQUID AND USE 1 SPRAY IN EACH NOSTRIL TWICE DAILY AS NEEDED 18.2 g 3   glucose blood (ACCU-CHEK GUIDE TEST) test strip Use as  instructed to check blood glucose once daily 100 each 3   hydrOXYzine (ATARAX) 25 MG tablet Take 25 mg by mouth 4 (four) times daily.     ketorolac  (ACULAR ) 0.5 % ophthalmic solution SMARTSIG:In Eye(s)     lidocaine  (LIDODERM ) 5 % Place 1 patch onto the skin daily. Remove & Discard patch within 12 hours or as directed by MD 30 patch 0   lisinopril  (ZESTRIL ) 10 MG tablet TAKE 1 TABLET(10 MG) BY MOUTH DAILY 90 tablet 0   lisinopril  (ZESTRIL ) 20 MG tablet TAKE 1 TABLET(20 MG) BY MOUTH DAILY 90 tablet 0   metFORMIN  (GLUCOPHAGE -XR) 500 MG 24 hr tablet Take 1 tablet (500 mg total) by mouth daily. 90 tablet 1   naproxen  (NAPROSYN ) 375 MG tablet Take 1 tablet (375 mg total) by mouth 2 (two) times daily. 20 tablet 0   ofloxacin (OCUFLOX) 0.3 % ophthalmic solution      prednisoLONE acetate (PRED FORTE) 1 % ophthalmic suspension SMARTSIG:In Eye(s)     predniSONE  (DELTASONE ) 10 MG tablet Take 5 on day 2 Take 4 on day 3 Take 3 on day 4 Take 2 on day 5 Take 1 on day 6 15 tablet 0   promethazine  (PHENERGAN ) 12.5 MG tablet Take 1 tablet (12.5 mg total) by mouth every 8 (eight) hours as needed for up to 10 doses for nausea or vomiting. 10 tablet 0   traMADol  (ULTRAM ) 50 MG tablet Take 1 tablet (50 mg total) by mouth every 6 (  six) hours as needed. 15 tablet 0   No current facility-administered medications on file prior to visit.    Allergies  Allergen Reactions   Codeine Itching   Hydrocodone Itching   Hydrocortisone     itching   Propoxyphene Itching and Nausea Only    itching    Social History   Socioeconomic History   Marital status: Widowed    Spouse name: Not on file   Number of children: Not on file   Years of education: Not on file   Highest education level: Not on file  Occupational History   Not on file  Tobacco Use   Smoking status: Never   Smokeless tobacco: Never  Vaping Use   Vaping status: Never Used  Substance and Sexual Activity   Alcohol use: No   Drug use: No   Sexual  activity: Not Currently  Other Topics Concern   Not on file  Social History Narrative   Not on file   Social Drivers of Health   Financial Resource Strain: Low Risk  (10/27/2021)   Overall Financial Resource Strain (CARDIA)    Difficulty of Paying Living Expenses: Not hard at all  Food Insecurity: No Food Insecurity (10/27/2021)   Hunger Vital Sign    Worried About Running Out of Food in the Last Year: Never true    Ran Out of Food in the Last Year: Never true  Transportation Needs: No Transportation Needs (10/27/2021)   PRAPARE - Administrator, Civil Service (Medical): No    Lack of Transportation (Non-Medical): No  Physical Activity: Sufficiently Active (10/27/2021)   Exercise Vital Sign    Days of Exercise per Week: 7 days    Minutes of Exercise per Session: 60 min  Stress: No Stress Concern Present (05/09/2023)   [employer] of Occupational Health - Occupational Stress Questionnaire    Feeling of Stress : Not at all  Social Connections: Moderately Integrated (05/09/2023)   Social Connection and Isolation Panel    Frequency of Communication with Friends and Family: More than three times a week    Frequency of Social Gatherings with Friends and Family: More than three times a week    Attends Religious Services: More than 4 times per year    Active Member of [employer] or Organizations: Yes    Attends Banker Meetings: More than 4 times per year    Marital Status: Widowed  Intimate Partner Violence: Not At Risk (10/27/2021)   Humiliation, Afraid, Rape, and Kick questionnaire    Fear of Current or Ex-Partner: No    Emotionally Abused: No    Physically Abused: No    Sexually Abused: No    Family History  Problem Relation Age of Onset   Heart attack Father    Diabetes Sister    Thyroid disease Daughter     Past Surgical History:  Procedure Laterality Date   ABDOMINAL HYSTERECTOMY     BACK SURGERY     GANGLION CYST EXCISION      ROS: Review of  Systems Negative except as stated above  PHYSICAL EXAM: BP 128/82   Pulse 81   Temp 98.3 F (36.8 C) (Oral)   Resp 16   Ht 5' 3 (1.6 m)   Wt 193 lb 12.8 oz (87.9 kg)   SpO2 98%   BMI 34.33 kg/m   Physical Exam HENT:     Head: Normocephalic and atraumatic.     Nose: Nose normal.  Mouth/Throat:     Mouth: Mucous membranes are moist.     Pharynx: Oropharynx is clear.  Eyes:     Extraocular Movements: Extraocular movements intact.     Conjunctiva/sclera: Conjunctivae normal.     Pupils: Pupils are equal, round, and reactive to light.  Cardiovascular:     Rate and Rhythm: Normal rate and regular rhythm.     Pulses: Normal pulses.     Heart sounds: Normal heart sounds.  Pulmonary:     Effort: Pulmonary effort is normal.     Breath sounds: Normal breath sounds.  Musculoskeletal:        General: Normal range of motion.     Right shoulder: Normal.     Left shoulder: Normal.     Right upper arm: Normal.     Left upper arm: Normal.     Right elbow: Normal.     Left elbow: Normal.     Right forearm: Normal.     Left forearm: Normal.     Right wrist: Normal.     Left wrist: Normal.     Right hand: Normal.     Left hand: Normal.     Cervical back: Normal range of motion and neck supple.  Neurological:     General: No focal deficit present.     Mental Status: She is alert and oriented to person, place, and time.  Psychiatric:        Mood and Affect: Mood normal.        Behavior: Behavior normal.     ASSESSMENT AND PLAN: 1. Olecranon bursitis of left elbow (Primary) - Resoled.   2. Throat irritation - Continue present management.  - Referral to ENT for evaluation/management.  - Follow-up with primary provider as scheduled.  - Ambulatory referral to ENT  3. Gastroesophageal reflux disease, unspecified whether esophagitis present - Continue Pantoprazole  as prescribed. Counseled on medication adherence/adverse effects.  - Follow-up with primary provider as  scheduled.  - pantoprazole  (PROTONIX ) 40 MG tablet; Take 1 tablet (40 mg total) by mouth daily.  Dispense: 90 tablet; Refill: 0   Patient was given the opportunity to ask questions.  Patient verbalized understanding of the plan and was able to repeat key elements of the plan. Patient was given clear instructions to go to Emergency Department or return to medical center if symptoms don't improve, worsen, or new problems develop.The patient verbalized understanding.   Orders Placed This Encounter  Procedures   Ambulatory referral to ENT     Requested Prescriptions   Signed Prescriptions Disp Refills   pantoprazole  (PROTONIX ) 40 MG tablet 90 tablet 0    Sig: Take 1 tablet (40 mg total) by mouth daily.    Return for Follow-up as needed with Raguel Blush, MD.  Greig JINNY Drones, NP

## 2024-06-01 ENCOUNTER — Ambulatory Visit

## 2024-06-01 VITALS — Ht 63.0 in | Wt 193.0 lb

## 2024-06-01 DIAGNOSIS — Z0000 Encounter for general adult medical examination without abnormal findings: Secondary | ICD-10-CM | POA: Diagnosis not present

## 2024-06-01 NOTE — Patient Instructions (Addendum)
 Carrie Kline,  Thank you for taking the time for your Medicare Wellness Visit. I appreciate your continued commitment to your health goals. Please review the care plan we discussed, and feel free to reach out if I can assist you further.  Medicare recommends these wellness visits once per year to help you and your care team stay ahead of potential health issues. These visits are designed to focus on prevention, allowing your provider to concentrate on managing your acute and chronic conditions during your regular appointments.  Please note that Annual Wellness Visits do not include a physical exam. Some assessments may be limited, especially if the visit was conducted virtually. If needed, we may recommend a separate in-person follow-up with your provider.  Ongoing Care Seeing your primary care provider every 3 to 6 months helps us  monitor your health and provide consistent, personalized care. Next office visit on 06/17/2024.  You are due for a flu vaccine, a pneumonia vaccine, a foot exam, a A1c Check, a kidney evaluation and a Hep C screening, which all can be done during your next office visit.   You are also due for a bone density screening so remember to discuss that during your office visit as well.  Referrals If a referral was made during today's visit and you haven't received any updates within two weeks, please contact the referred provider directly to check on the status.  Recommended Screenings:  Health Maintenance  Topic Date Due   Hepatitis C Screening  Never done   Pneumococcal Vaccine for age over 7 (1 of 2 - PCV) Never done   DEXA scan (bone density measurement)  Never done   Yearly kidney health urinalysis for diabetes  10/25/2023   Complete foot exam   10/25/2023   Medicare Annual Wellness Visit  10/25/2023   Eye exam for diabetics  01/17/2024   Hemoglobin A1C  04/29/2024   Yearly kidney function blood test for diabetes  04/14/2025   Meningitis B Vaccine  Aged Out    DTaP/Tdap/Td vaccine  Discontinued   Flu Shot  Discontinued   COVID-19 Vaccine  Discontinued   Zoster (Shingles) Vaccine  Discontinued       06/01/2024    1:46 PM  Advanced Directives  Does Patient Have a Medical Advance Directive? Yes  Type of Advance Directive Healthcare Power of Attorney  Copy of Healthcare Power of Attorney in Chart? No - copy requested   Advance Care Planning is important because it: Ensures you receive medical care that aligns with your values, goals, and preferences. Provides guidance to your family and loved ones, reducing the emotional burden of decision-making during critical moments.  Vision: Annual vision screenings are recommended for early detection of glaucoma, cataracts, and diabetic retinopathy. These exams can also reveal signs of chronic conditions such as diabetes and high blood pressure.  Dental: Annual dental screenings help detect early signs of oral cancer, gum disease, and other conditions linked to overall health, including heart disease and diabetes.  Please see the attached documents for additional preventive care recommendations.

## 2024-06-01 NOTE — Progress Notes (Signed)
 Subjective:   Carrie Kline is a 78 y.o. who presents for a Medicare Wellness preventive visit.  As a reminder, Annual Wellness Visits don't include a physical exam, and some assessments may be limited, especially if this visit is performed virtually. We may recommend an in-person follow-up visit with your provider if needed.  Visit Complete: Virtual I connected with  Esraa Dalziel on 06/01/24 by a audio enabled telemedicine application and verified that I am speaking with the correct person using two identifiers.  Patient Location: Home  Provider Location: Home Office  I discussed the limitations of evaluation and management by telemedicine. The patient expressed understanding and agreed to proceed.  Vital Signs: Because this visit was a virtual/telehealth visit, some criteria may be missing or patient reported. Any vitals not documented were not able to be obtained and vitals that have been documented are patient reported.  VideoDeclined- This patient declined Librarian, academic. Therefore the visit was completed with audio only.  Persons Participating in Visit: Patient.  AWV Questionnaire: No: Patient Medicare AWV questionnaire was not completed prior to this visit.  Cardiac Risk Factors include: advanced age (>51men, >41 women);diabetes mellitus;Other (see comment), Risk factor comments: Acute cerebral infarction     Objective:    Today's Vitals   06/01/24 1342  Weight: 193 lb (87.5 kg)  Height: 5' 3 (1.6 m)   Body mass index is 34.19 kg/m.     06/01/2024    1:46 PM 04/14/2024    5:36 PM 09/18/2023   10:51 AM 02/26/2023    3:35 PM 05/24/2022    4:41 PM 03/03/2022    1:13 PM 09/28/2021    3:40 PM  Advanced Directives  Does Patient Have a Medical Advance Directive? Yes No No No No No No  Type of Media planner of Healthcare Power of Attorney in Chart? No - copy requested        Would patient like  information on creating a medical advance directive?  No - Patient declined   No - Patient declined      Current Medications (verified) Outpatient Encounter Medications as of 06/01/2024  Medication Sig   Accu-Chek Softclix Lancets lancets Use as instructed to monitor blood glucose once daily   albuterol  (VENTOLIN  HFA) 108 (90 Base) MCG/ACT inhaler INHALE 2 PUFFS INTO THE LUNGS EVERY 6 HOURS AS NEEDED FOR WHEEZING OR SHORTNESS OF BREATH   atorvastatin  (LIPITOR) 40 MG tablet Take 1 tablet (40 mg total) by mouth daily.   Blood Glucose Monitoring Suppl (ACCU-CHEK GUIDE) w/Device KIT Use as directed to monitor blood sugar once daily   clopidogrel  (PLAVIX ) 75 MG tablet Take 1 tablet (75 mg total) by mouth daily.   fluticasone  (FLONASE ) 50 MCG/ACT nasal spray SHAKE LIQUID AND USE 1 SPRAY IN EACH NOSTRIL TWICE DAILY AS NEEDED   glucose blood (ACCU-CHEK GUIDE TEST) test strip Use as instructed to check blood glucose once daily   hydrOXYzine (ATARAX) 25 MG tablet Take 25 mg by mouth 4 (four) times daily.   ketorolac  (ACULAR ) 0.5 % ophthalmic solution SMARTSIG:In Eye(s)   lidocaine  (LIDODERM ) 5 % Place 1 patch onto the skin daily. Remove & Discard patch within 12 hours or as directed by MD   lisinopril  (ZESTRIL ) 10 MG tablet TAKE 1 TABLET(10 MG) BY MOUTH DAILY   lisinopril  (ZESTRIL ) 20 MG tablet TAKE 1 TABLET(20 MG) BY MOUTH DAILY   metFORMIN  (GLUCOPHAGE -XR) 500 MG 24 hr tablet  Take 1 tablet (500 mg total) by mouth daily.   naproxen  (NAPROSYN ) 375 MG tablet Take 1 tablet (375 mg total) by mouth 2 (two) times daily.   ofloxacin (OCUFLOX) 0.3 % ophthalmic solution    pantoprazole  (PROTONIX ) 40 MG tablet Take 1 tablet (40 mg total) by mouth daily.   prednisoLONE acetate (PRED FORTE) 1 % ophthalmic suspension SMARTSIG:In Eye(s)   predniSONE  (DELTASONE ) 10 MG tablet Take 5 on day 2 Take 4 on day 3 Take 3 on day 4 Take 2 on day 5 Take 1 on day 6   promethazine  (PHENERGAN ) 12.5 MG tablet Take 1 tablet (12.5 mg  total) by mouth every 8 (eight) hours as needed for up to 10 doses for nausea or vomiting.   traMADol  (ULTRAM ) 50 MG tablet Take 1 tablet (50 mg total) by mouth every 6 (six) hours as needed.   No facility-administered encounter medications on file as of 06/01/2024.    Allergies (verified) Codeine, Hydrocodone, Hydrocortisone, and Propoxyphene   History: Past Medical History:  Diagnosis Date   Gout    Seasonal allergies    Stroke [facility])    Past Surgical History:  Procedure Laterality Date   ABDOMINAL HYSTERECTOMY     BACK SURGERY     GANGLION CYST EXCISION     Family History  Problem Relation Age of Onset   Heart attack Father    Diabetes Sister    Thyroid disease Daughter    Social History   Socioeconomic History   Marital status: Widowed    Spouse name: Not on file   Number of children: Not on file   Years of education: Not on file   Highest education level: Not on file  Occupational History   Occupation: RETIRED  Tobacco Use   Smoking status: Never   Smokeless tobacco: Never  Vaping Use   Vaping status: Never Used  Substance and Sexual Activity   Alcohol use: No   Drug use: No   Sexual activity: Not Currently  Other Topics Concern   Not on file  Social History Narrative   Lives with her daughter/2025   Social Drivers of Health   Financial Resource Strain: Low Risk  (06/01/2024)   Overall Financial Resource Strain (CARDIA)    Difficulty of Paying Living Expenses: Not hard at all  Food Insecurity: No Food Insecurity (06/01/2024)   Hunger Vital Sign    Worried About Running Out of Food in the Last Year: Never true    Ran Out of Food in the Last Year: Never true  Transportation Needs: No Transportation Needs (06/01/2024)   PRAPARE - Administrator, Civil Service (Medical): No    Lack of Transportation (Non-Medical): No  Physical Activity: Inactive (06/01/2024)   Exercise Vital Sign    Days of Exercise per Week: 0 days    Minutes of Exercise per  Session: 0 min  Stress: No Stress Concern Present (06/01/2024)   [employer] of Occupational Health - Occupational Stress Questionnaire    Feeling of Stress: Not at all  Social Connections: Moderately Integrated (06/01/2024)   Social Connection and Isolation Panel    Frequency of Communication with Friends and Family: More than three times a week    Frequency of Social Gatherings with Friends and Family: More than three times a week    Attends Religious Services: More than 4 times per year    Active Member of [employer] or Organizations: Yes    Attends Banker Meetings: More than  4 times per year    Marital Status: Widowed    Tobacco Counseling Counseling given: Not Answered    Clinical Intake:  Pre-visit preparation completed: Yes  Pain : No/denies pain     BMI - recorded: 34.19 Nutritional Status: BMI > 30  Obese Nutritional Risks: None Diabetes: Yes CBG done?: Yes (118-per pt-fasting) CBG resulted in Enter/ Edit results?: No Did pt. bring in CBG monitor from home?: No  Lab Results  Component Value Date   HGBA1C 6.2 10/28/2023   HGBA1C 6.4 07/30/2023   HGBA1C 8.6 (A) 05/16/2023     How often do you need to have someone help you when you read instructions, pamphlets, or other written materials from your doctor or pharmacy?: 1 - Never  Interpreter Needed?: No  Information entered by :: Abednego Yeates, RMA   Activities of Daily Living     06/01/2024    1:44 PM  In your present state of health, do you have any difficulty performing the following activities:  Hearing? 0  Vision? 0  Difficulty concentrating or making decisions? 0  Walking or climbing stairs? 0  Dressing or bathing? 0  Doing errands, shopping? 0  Comment daughters drive her around  Preparing Food and eating ? N  Using the Toilet? N  In the past six months, have you accidently leaked urine? N  Do you have problems with loss of bowel control? N  Managing your Medications? N   Managing your Finances? N  Housekeeping or managing your Housekeeping? N    Patient Care Team: Tanda Bleacher, MD as PCP - General (Family Medicine) [facility]  I have updated your Care Teams any recent Medical Services you may have received from other providers in the past year.     Assessment:   This is a routine wellness examination for Carrie Kline.  Hearing/Vision screen Hearing Screening - Comments:: Denies hearing difficulties   Vision Screening - Comments:: Wears eyeglasses for reading/[facility]   Goals Addressed               This Visit's Progress     Patient Stated (pt-stated)        Would like to stop using the cane/2025       Depression Screen     06/01/2024    1:51 PM 05/11/2024    4:13 PM 01/07/2024    3:41 PM 08/08/2023    1:22 PM 05/09/2023    3:38 PM 04/30/2023    9:44 AM 10/25/2022    2:08 PM  PHQ 2/9 Scores  PHQ - 2 Score 0 0 0 0 0 0 0  PHQ- 9 Score 0     1 0    Fall Risk     06/01/2024    1:48 PM 05/11/2024    4:13 PM 01/07/2024    3:37 PM 08/08/2023    1:23 PM 05/09/2023    3:46 PM  Fall Risk   Falls in the past year? 0 0 0 0   Number falls in past yr: 0 0 0 0 0  Injury with Fall? 0 0 0 0 0  Risk for fall due to :  No Fall Risks No Fall Risks No Fall Risks   Follow up Falls evaluation completed;Falls prevention discussed Falls evaluation completed Falls evaluation completed Falls evaluation completed     MEDICARE RISK AT HOME:  Medicare Risk at Home Any stairs in or around the home?: Yes If so, are there any without handrails?: No  Home free of loose throw rugs in walkways, pet beds, electrical cords, etc?: Yes Adequate lighting in your home to reduce risk of falls?: Yes Life alert?: No Use of a cane, walker or w/c?: Yes (cane) Grab bars in the bathroom?: Yes Shower chair or bench in shower?: Yes Elevated toilet seat or a handicapped toilet?: Yes  TIMED UP AND GO:  Was the test performed?   No  Cognitive Function: Declined/Normal: No cognitive concerns noted by patient or family. Patient alert, oriented, able to answer questions appropriately and recall recent events. No signs of memory loss or confusion.        10/27/2021    1:14 PM  6CIT Screen  What Year? 0 points  What month? 0 points  What time? 0 points  Count back from 20 0 points  Months in reverse 0 points  Repeat phrase 2 points  Total Score 2 points    Immunizations Immunization History  Administered Date(s) Administered   Influenza,inj,Quad PF,6+ Mos 07/05/2021   Moderna Sars-Covid-2 Vaccination 10/23/2019, 11/23/2019   PPD Test 05/02/2011    Screening Tests Health Maintenance  Topic Date Due   Hepatitis C Screening  Never done   Pneumococcal Vaccine: 50+ Years (1 of 2 - PCV) Never done   DEXA SCAN  Never done   Diabetic kidney evaluation - Urine ACR  10/25/2023   FOOT EXAM  10/25/2023   OPHTHALMOLOGY EXAM  01/17/2024   HEMOGLOBIN A1C  04/29/2024   Diabetic kidney evaluation - eGFR measurement  04/14/2025   Medicare Annual Wellness (AWV)  06/01/2025   Meningococcal B Vaccine  Aged Out   DTaP/Tdap/Td  Discontinued   Influenza Vaccine  Discontinued   COVID-19 Vaccine  Discontinued   Zoster Vaccines- Shingrix  Discontinued    Health Maintenance Items Addressed: Diabetic Foot Exam recommended, Labs Due A1c check, UACR and Hep C screening, See Nurse Notes at the end of this note  Additional Screening:  Vision Screening: Recommended annual ophthalmology exams for early detection of glaucoma and other disorders of the eye. Is the patient up to date with their annual eye exam?  No  Who is the provider or what is the name of the office in which the patient attends annual eye exams? [facility]/ [place]  Dental Screening: Recommended annual dental exams for proper oral hygiene  Community Resource Referral / Chronic Care Management: CRR required this visit?  No   CCM required this visit?   No   Plan:    I have personally reviewed and noted the following in the patient's chart:   Medical and social history Use of alcohol, tobacco or illicit drugs  Current medications and supplements including opioid prescriptions. Patient is not currently taking opioid prescriptions. Functional ability and status Nutritional status Physical activity Advanced directives List of other physicians Hospitalizations, surgeries, and ER visits in previous 12 months Vitals Screenings to include cognitive, depression, and falls Referrals and appointments  In addition, I have reviewed and discussed with patient certain preventive protocols, quality metrics, and best practice recommendations. A written personalized care plan for preventive services as well as general preventive health recommendations were provided to patient.   Avigdor Dollar L Cleatis Fandrich, CMA   06/01/2024   After Visit Summary: (MyChart) Due to this being a telephonic visit, the after visit summary with patients personalized plan was offered to patient via MyChart   Notes: Patient is due for a flu vaccine, a pneumonia vaccine, a foot exam, a A1c check, a UACR and a  Hep C screening.  Patient can have all these done during her up coming office visit with provider.  She is also due for a DEXA and would like to discuss in her next office visit with provider.  Patient stated that she had a eye exam for this year.  I have sent a request for patient's eye exam record out today.

## 2024-06-03 DIAGNOSIS — R0982 Postnasal drip: Secondary | ICD-10-CM | POA: Diagnosis not present

## 2024-06-03 DIAGNOSIS — T7840XA Allergy, unspecified, initial encounter: Secondary | ICD-10-CM | POA: Diagnosis not present

## 2024-06-03 DIAGNOSIS — J329 Chronic sinusitis, unspecified: Secondary | ICD-10-CM | POA: Diagnosis not present

## 2024-06-09 ENCOUNTER — Ambulatory Visit

## 2024-06-09 NOTE — Progress Notes (Signed)
 Carrie Kline                                          MRN: [id number]   06/09/2024   The [facility] Quality Team Specialist reviewed this patient medical record for the purposes of chart review for care gap closure. The following were reviewed: chart review for care gap closure-kidney health evaluation for diabetes:eGFR  and uACR.    [facility] Quality Team

## 2024-06-17 ENCOUNTER — Encounter: Admitting: Family Medicine

## 2024-06-30 ENCOUNTER — Other Ambulatory Visit: Payer: Self-pay | Admitting: Family Medicine

## 2024-07-14 ENCOUNTER — Telehealth: Payer: Self-pay | Admitting: Family Medicine

## 2024-07-14 NOTE — Telephone Encounter (Signed)
 Okay this is first time I have seen this form. Printed it from chart media. Given to Dr. Tanda for review and signature

## 2024-07-14 NOTE — Telephone Encounter (Signed)
 Copied from CRM #[id number]. Topic: General - Other >> Jul 13, 2024  4:41 PM Selinda RAMAN wrote: Reason for CRM: [place] with [employer] called in regarding a form that was faxed and received by the provider last Monday 07/06/24. He states both pages need to be signed and dated and on the page CSRA only questions 63 & 20 need to be completed. If there are any questions please call [phone]. Please assist further

## 2024-07-15 ENCOUNTER — Telehealth: Payer: Self-pay | Admitting: Family Medicine

## 2024-07-15 NOTE — Telephone Encounter (Signed)
 A document form has been faxed: prescription/certificate of medical necessity: disposable underpads and gloves, to be filled out by provider. Send document back via Fax within 7-days to 14 days. Document is located in providers tray at front office.          Fax number: [phone]

## 2024-07-17 ENCOUNTER — Other Ambulatory Visit: Payer: Self-pay

## 2024-07-17 ENCOUNTER — Other Ambulatory Visit: Payer: Self-pay | Admitting: Family Medicine

## 2024-07-17 NOTE — Telephone Encounter (Signed)
 Signed and faxed back to requested fax number

## 2024-07-17 NOTE — Telephone Encounter (Signed)
 Copied from CRM #[id number]. Topic: Clinical - Medication Prior Auth >> Jul 17, 2024  9:46 AM Antwanette L wrote: Reason for CRM: Patient is calling because [employer] [phone]) is requesting prior authorization for clopidogrel  (Plavix ) 75 mg tablets. A refill request was submitted and the patient reports she is currently out of her medication

## 2024-07-17 NOTE — Telephone Encounter (Signed)
 Copied from CRM [id number]. Topic: Clinical - Medication Refill >> Jul 17, 2024  9:44 AM Antwanette L wrote: Medication: clopidogrel  (PLAVIX ) 75 MG tablet  Has the patient contacted their pharmacy? Yes   This is the patient's preferred pharmacy:  [id number] [place] - [address] AT [facility] [address] [place] [address] Phone: [phone] Fax: [phone]  Is this the correct pharmacy for this prescription? Yes    Has the prescription been filled recently? No. Last refill was on 04/30/23  Is the patient out of the medication? Yes  Has the patient been seen for an appointment in the last year OR does the patient have an upcoming appointment? Yes. Last ov with Dr. Tanda was on 01/07/24 and next appt is 08/05/24  Can we respond through MyChart? No. Contact the patient by phone at [phone]  Agent: Please be advised that Rx refills may take up to 3 business days. We ask that you follow-up with your pharmacy.

## 2024-07-27 NOTE — Telephone Encounter (Signed)
 Copied from CRM [id number]. Topic: Clinical - Prescription Issue >> Jul 22, 2024 12:28 PM Hadassah PARAS wrote: Reason for CRM: Pt is calling to check the status of Plavix  and atorvastatin . This request was sent  in on the 21st/ Please advise #[phone]

## 2024-07-27 NOTE — Telephone Encounter (Signed)
 Copied from CRM [id number]. Topic: Clinical - Prescription Issue >> Jul 27, 2024 10:51 AM Leonette SQUIBB wrote: Reason for CRM: Patient called saying she has been asking the pharmacy for a refill on her Plavix  and Atorvastatin  for several weeks,  She said they told her they have requested the prescription 4 times from the office.  She said she has been taking these for years.  Patient has been out of the Plavix  for a week .  Please advise  [phone]

## 2024-07-30 MED ORDER — ATORVASTATIN CALCIUM 40 MG PO TABS: 40.0000 mg | ORAL_TABLET | Freq: Every day | ORAL | 1 refills | Status: AC

## 2024-07-30 MED ORDER — CLOPIDOGREL BISULFATE 75 MG PO TABS: 75.0000 mg | ORAL_TABLET | Freq: Every day | ORAL | 3 refills | Status: AC

## 2024-08-05 ENCOUNTER — Ambulatory Visit (INDEPENDENT_AMBULATORY_CARE_PROVIDER_SITE_OTHER): Admitting: Family Medicine

## 2024-08-05 ENCOUNTER — Encounter: Payer: Self-pay | Admitting: Family Medicine

## 2024-08-05 VITALS — BP 155/83 | HR 95 | Ht 63.0 in | Wt 197.8 lb

## 2024-08-05 DIAGNOSIS — Z130 Encounter for screening for diseases of the blood and blood-forming organs and certain disorders involving the immune mechanism: Secondary | ICD-10-CM | POA: Diagnosis not present

## 2024-08-05 DIAGNOSIS — Z1329 Encounter for screening for other suspected endocrine disorder: Secondary | ICD-10-CM

## 2024-08-05 DIAGNOSIS — E119 Type 2 diabetes mellitus without complications: Secondary | ICD-10-CM | POA: Diagnosis not present

## 2024-08-05 DIAGNOSIS — Z13228 Encounter for screening for other metabolic disorders: Secondary | ICD-10-CM

## 2024-08-05 DIAGNOSIS — Z136 Encounter for screening for cardiovascular disorders: Secondary | ICD-10-CM

## 2024-08-05 DIAGNOSIS — Z1159 Encounter for screening for other viral diseases: Secondary | ICD-10-CM | POA: Diagnosis not present

## 2024-08-05 DIAGNOSIS — Z7984 Long term (current) use of oral hypoglycemic drugs: Secondary | ICD-10-CM | POA: Diagnosis not present

## 2024-08-05 DIAGNOSIS — Z0000 Encounter for general adult medical examination without abnormal findings: Secondary | ICD-10-CM

## 2024-08-05 NOTE — Progress Notes (Signed)
 Established Patient Office Visit  Subjective    Patient ID: Carrie Kline, female    DOB: 03/06/1946  Age: 78 y.o. MRN: [id number]  CC:  Chief Complaint  Patient presents with   Annual Exam    HPI Carrie Kline presents for routine annual exam. Patient denies acute complaints.   Outpatient Encounter Medications as of 08/05/2024  Medication Sig   Accu-Chek Softclix Lancets lancets Use as instructed to monitor blood glucose once daily   albuterol  (VENTOLIN  HFA) 108 (90 Base) MCG/ACT inhaler INHALE 2 PUFFS INTO THE LUNGS EVERY 6 HOURS AS NEEDED FOR WHEEZING OR SHORTNESS OF BREATH   atorvastatin  (LIPITOR) 40 MG tablet Take 1 tablet (40 mg total) by mouth daily.   Blood Glucose Monitoring Suppl (ACCU-CHEK GUIDE) w/Device KIT Use as directed to monitor blood sugar once daily   clopidogrel  (PLAVIX ) 75 MG tablet Take 1 tablet (75 mg total) by mouth daily.   fluticasone  (FLONASE ) 50 MCG/ACT nasal spray SHAKE LIQUID AND USE 1 SPRAY IN EACH NOSTRIL TWICE DAILY AS NEEDED   glucose blood (ACCU-CHEK GUIDE TEST) test strip Use as instructed to check blood glucose once daily   lisinopril  (ZESTRIL ) 20 MG tablet TAKE 1 TABLET(20 MG) BY MOUTH DAILY   metFORMIN  (GLUCOPHAGE -XR) 500 MG 24 hr tablet Take 1 tablet (500 mg total) by mouth daily.   pantoprazole  (PROTONIX ) 40 MG tablet Take 1 tablet (40 mg total) by mouth daily.   hydrOXYzine (ATARAX) 25 MG tablet Take 25 mg by mouth 4 (four) times daily. (Patient not taking: Reported on 08/05/2024)   ketorolac  (ACULAR ) 0.5 % ophthalmic solution SMARTSIG:In Eye(s) (Patient not taking: Reported on 08/05/2024)   lidocaine  (LIDODERM ) 5 % Place 1 patch onto the skin daily. Remove & Discard patch within 12 hours or as directed by MD (Patient not taking: Reported on 08/05/2024)   lisinopril  (ZESTRIL ) 10 MG tablet TAKE 1 TABLET(10 MG) BY MOUTH DAILY (Patient not taking: Reported on 08/05/2024)   naproxen  (NAPROSYN ) 375 MG tablet Take 1 tablet (375 mg total) by mouth 2  (two) times daily. (Patient not taking: Reported on 08/05/2024)   ofloxacin (OCUFLOX) 0.3 % ophthalmic solution    prednisoLONE acetate (PRED FORTE) 1 % ophthalmic suspension SMARTSIG:In Eye(s)   predniSONE  (DELTASONE ) 10 MG tablet Take 5 on day 2 Take 4 on day 3 Take 3 on day 4 Take 2 on day 5 Take 1 on day 6 (Patient not taking: Reported on 08/05/2024)   promethazine  (PHENERGAN ) 12.5 MG tablet Take 1 tablet (12.5 mg total) by mouth every 8 (eight) hours as needed for up to 10 doses for nausea or vomiting. (Patient not taking: Reported on 08/05/2024)   traMADol  (ULTRAM ) 50 MG tablet Take 1 tablet (50 mg total) by mouth every 6 (six) hours as needed. (Patient not taking: Reported on 08/05/2024)   No facility-administered encounter medications on file as of 08/05/2024.    Past Medical History:  Diagnosis Date   Gout    Seasonal allergies    Stroke [facility])     Past Surgical History:  Procedure Laterality Date   ABDOMINAL HYSTERECTOMY     BACK SURGERY     GANGLION CYST EXCISION      Family History  Problem Relation Age of Onset   Heart attack Father    Diabetes Sister    Thyroid disease Daughter     Social History   Socioeconomic History   Marital status: Widowed    Spouse name: Not on file   Number of children:  Not on file   Years of education: Not on file   Highest education level: Not on file  Occupational History   Occupation: RETIRED  Tobacco Use   Smoking status: Never   Smokeless tobacco: Never  Vaping Use   Vaping status: Never Used  Substance and Sexual Activity   Alcohol use: No   Drug use: No   Sexual activity: Not Currently  Other Topics Concern   Not on file  Social History Narrative   Lives with her daughter/2025   Social Drivers of Health   Financial Resource Strain: Low Risk  (06/01/2024)   Overall Financial Resource Strain (CARDIA)    Difficulty of Paying Living Expenses: Not hard at all  Food Insecurity: No Food Insecurity (06/01/2024)   Hunger  Vital Sign    Worried About Running Out of Food in the Last Year: Never true    Ran Out of Food in the Last Year: Never true  Transportation Needs: No Transportation Needs (06/01/2024)   PRAPARE - Administrator, Civil Service (Medical): No    Lack of Transportation (Non-Medical): No  Physical Activity: Inactive (06/01/2024)   Exercise Vital Sign    Days of Exercise per Week: 0 days    Minutes of Exercise per Session: 0 min  Stress: No Stress Concern Present (06/01/2024)   [employer] of Occupational Health - Occupational Stress Questionnaire    Feeling of Stress: Not at all  Social Connections: Moderately Integrated (06/01/2024)   Social Connection and Isolation Panel    Frequency of Communication with Friends and Family: More than three times a week    Frequency of Social Gatherings with Friends and Family: More than three times a week    Attends Religious Services: More than 4 times per year    Active Member of [employer] or Organizations: Yes    Attends Banker Meetings: More than 4 times per year    Marital Status: Widowed  Intimate Partner Violence: Patient Unable To Answer (06/01/2024)   Humiliation, Afraid, Rape, and Kick questionnaire    Fear of Current or Ex-Partner: Patient unable to answer    Emotionally Abused: Patient unable to answer    Physically Abused: Patient unable to answer    Sexually Abused: Patient unable to answer    Review of Systems  All other systems reviewed and are negative.       Objective    BP (!) 155/83   Pulse 95   Ht 5' 3 (1.6 m)   Wt 197 lb 12.8 oz (89.7 kg)   SpO2 96%   BMI 35.04 kg/m   Physical Exam Vitals and nursing note reviewed.  Constitutional:      General: She is not in acute distress.    Appearance: She is obese.  HENT:     Head: Normocephalic and atraumatic.     Right Ear: Tympanic membrane, ear canal and external ear normal.     Left Ear: Tympanic membrane, ear canal and external ear normal.      Nose: Nose normal.     Mouth/Throat:     Mouth: Mucous membranes are moist.     Pharynx: Oropharynx is clear.  Eyes:     Conjunctiva/sclera: Conjunctivae normal.     Pupils: Pupils are equal, round, and reactive to light.  Neck:     Thyroid: No thyromegaly.  Cardiovascular:     Rate and Rhythm: Normal rate and regular rhythm.     Heart sounds: Normal heart sounds.  No murmur heard. Pulmonary:     Effort: Pulmonary effort is normal. No respiratory distress.     Breath sounds: Normal breath sounds.  Abdominal:     General: There is no distension.     Palpations: Abdomen is soft. There is no mass.     Tenderness: There is no abdominal tenderness.  Musculoskeletal:        General: Normal range of motion.     Cervical back: Normal range of motion and neck supple.  Skin:    General: Skin is warm and dry.  Neurological:     General: No focal deficit present.     Mental Status: She is alert and oriented to person, place, and time.  Psychiatric:        Mood and Affect: Mood normal.        Behavior: Behavior normal.         Assessment & Plan:   Annual physical exam -     Comprehensive metabolic panel with GFR  Screening for deficiency anemia -     CBC with Differential/Platelet  Encounter for screening for cardiovascular disorders -     Lipid panel  Screening for endocrine/metabolic/immunity disorders -     Hemoglobin A1c -     VITAMIN D 25 Hydroxy (Vit-D Deficiency, Fractures)  Need for hepatitis C screening test -     Hepatitis C antibody  Diabetes mellitus treated with oral medication (HCC) -     Hemoglobin A1c -     Protein / creatinine ratio, urine     No follow-ups on file.   Tanda Raguel SQUIBB, MD

## 2024-08-06 LAB — CBC WITH DIFFERENTIAL/PLATELET
Basophils Absolute: 0 x10E3/uL (ref 0.0–0.2)
Basos: 0 %
EOS (ABSOLUTE): 0.2 x10E3/uL (ref 0.0–0.4)
Eos: 2 %
Hematocrit: 38.6 % (ref 34.0–46.6)
Hemoglobin: 12.4 g/dL (ref 11.1–15.9)
Immature Grans (Abs): 0 x10E3/uL (ref 0.0–0.1)
Immature Granulocytes: 0 %
Lymphocytes Absolute: 2.4 x10E3/uL (ref 0.7–3.1)
Lymphs: 38 %
MCH: 28.2 pg (ref 26.6–33.0)
MCHC: 32.1 g/dL (ref 31.5–35.7)
MCV: 88 fL (ref 79–97)
Monocytes Absolute: 0.4 x10E3/uL (ref 0.1–0.9)
Monocytes: 7 %
Neutrophils Absolute: 3.3 x10E3/uL (ref 1.4–7.0)
Neutrophils: 53 %
Platelets: 210 x10E3/uL (ref 150–450)
RBC: 4.4 x10E6/uL (ref 3.77–5.28)
RDW: 13 % (ref 11.7–15.4)
WBC: 6.3 x10E3/uL (ref 3.4–10.8)

## 2024-08-06 LAB — COMPREHENSIVE METABOLIC PANEL WITH GFR
ALT: 16 IU/L (ref 0–32)
AST: 15 IU/L (ref 0–40)
Albumin: 4.1 g/dL (ref 3.8–4.8)
Alkaline Phosphatase: 140 IU/L — ABNORMAL HIGH (ref 49–135)
BUN/Creatinine Ratio: 24 (ref 12–28)
BUN: 21 mg/dL (ref 8–27)
Bilirubin Total: 0.4 mg/dL (ref 0.0–1.2)
CO2: 20 mmol/L (ref 20–29)
Calcium: 9.9 mg/dL (ref 8.7–10.3)
Chloride: 107 mmol/L — ABNORMAL HIGH (ref 96–106)
Creatinine, Ser: 0.88 mg/dL (ref 0.57–1.00)
Globulin, Total: 3.6 g/dL (ref 1.5–4.5)
Glucose: 92 mg/dL (ref 70–99)
Potassium: 4.4 mmol/L (ref 3.5–5.2)
Sodium: 141 mmol/L (ref 134–144)
Total Protein: 7.7 g/dL (ref 6.0–8.5)
eGFR: 68 mL/min/1.73 (ref >59)

## 2024-08-06 LAB — PROTEIN / CREATININE RATIO, URINE
Creatinine, Urine: 80.7 mg/dL
Protein, Ur: 26.7 mg/dL
Protein/Creat Ratio: 331 mg/g{creat} — ABNORMAL HIGH (ref 0–200)

## 2024-08-06 LAB — LIPID PANEL
Chol/HDL Ratio: 3.2 ratio (ref 0.0–4.4)
Cholesterol, Total: 133 mg/dL (ref 100–199)
HDL: 41 mg/dL (ref >39)
LDL Chol Calc (NIH): 75 mg/dL (ref 0–99)
Triglycerides: 87 mg/dL (ref 0–149)
VLDL Cholesterol Cal: 17 mg/dL (ref 5–40)

## 2024-08-06 LAB — HEPATITIS C ANTIBODY: Hep C Virus Ab: NONREACTIVE

## 2024-08-06 LAB — VITAMIN D 25 HYDROXY (VIT D DEFICIENCY, FRACTURES): Vit D, 25-Hydroxy: 6.8 ng/mL — ABNORMAL LOW (ref 30.0–100.0)

## 2024-08-06 LAB — HEMOGLOBIN A1C
Est. average glucose Bld gHb Est-mCnc: 134 mg/dL
Hgb A1c MFr Bld: 6.3 % — ABNORMAL HIGH (ref 4.8–5.6)

## 2024-08-11 ENCOUNTER — Ambulatory Visit: Payer: Self-pay | Admitting: Family Medicine

## 2024-08-11 MED ORDER — VITAMIN D (ERGOCALCIFEROL) 1.25 MG (50000 UNIT) PO CAPS: 50000.0000 [IU] | ORAL_CAPSULE | ORAL | 0 refills | Status: AC

## 2024-09-29 ENCOUNTER — Other Ambulatory Visit: Payer: Self-pay | Admitting: Family Medicine

## 2024-09-29 DIAGNOSIS — E1159 Type 2 diabetes mellitus with other circulatory complications: Secondary | ICD-10-CM

## 2024-12-07 ENCOUNTER — Ambulatory Visit: Admitting: Family Medicine

## 2025-06-03 ENCOUNTER — Ambulatory Visit: Payer: Self-pay
